# Patient Record
Sex: Female | Born: 1940 | Race: Black or African American | Hispanic: No | Marital: Single | Smoking: Current every day smoker
Health system: Southern US, Community
[De-identification: ages and names within clinical notes are randomized; demographics above are authoritative.]

## PROBLEM LIST (undated history)

## (undated) ENCOUNTER — Emergency Department (HOSPITAL_COMMUNITY): Admission: RE | Discharge status: Absent without leave | Payer: Medicare Other | Source: Ambulatory Visit

## (undated) DIAGNOSIS — M1990 Unspecified osteoarthritis, unspecified site: Secondary | ICD-10-CM

## (undated) DIAGNOSIS — R109 Unspecified abdominal pain: Secondary | ICD-10-CM

## (undated) DIAGNOSIS — F419 Anxiety disorder, unspecified: Secondary | ICD-10-CM

## (undated) DIAGNOSIS — I219 Acute myocardial infarction, unspecified: Secondary | ICD-10-CM

## (undated) DIAGNOSIS — D649 Anemia, unspecified: Secondary | ICD-10-CM

## (undated) DIAGNOSIS — F329 Major depressive disorder, single episode, unspecified: Secondary | ICD-10-CM

## (undated) DIAGNOSIS — F32A Depression, unspecified: Secondary | ICD-10-CM

## (undated) DIAGNOSIS — M7540 Impingement syndrome of unspecified shoulder: Secondary | ICD-10-CM

## (undated) DIAGNOSIS — K219 Gastro-esophageal reflux disease without esophagitis: Secondary | ICD-10-CM

## (undated) DIAGNOSIS — I350 Nonrheumatic aortic (valve) stenosis: Secondary | ICD-10-CM

## (undated) DIAGNOSIS — E7800 Pure hypercholesterolemia, unspecified: Secondary | ICD-10-CM

## (undated) DIAGNOSIS — G709 Myoneural disorder, unspecified: Secondary | ICD-10-CM

## (undated) DIAGNOSIS — I2510 Atherosclerotic heart disease of native coronary artery without angina pectoris: Secondary | ICD-10-CM

## (undated) DIAGNOSIS — E785 Hyperlipidemia, unspecified: Secondary | ICD-10-CM

## (undated) DIAGNOSIS — R0600 Dyspnea, unspecified: Secondary | ICD-10-CM

## (undated) DIAGNOSIS — I10 Essential (primary) hypertension: Secondary | ICD-10-CM

## (undated) DIAGNOSIS — Z8719 Personal history of other diseases of the digestive system: Secondary | ICD-10-CM

## (undated) DIAGNOSIS — I739 Peripheral vascular disease, unspecified: Secondary | ICD-10-CM

## (undated) DIAGNOSIS — Z9289 Personal history of other medical treatment: Secondary | ICD-10-CM

## (undated) DIAGNOSIS — D509 Iron deficiency anemia, unspecified: Secondary | ICD-10-CM

## (undated) HISTORY — DX: Anxiety disorder, unspecified: F41.9

## (undated) HISTORY — DX: Gastro-esophageal reflux disease without esophagitis: K21.9

## (undated) HISTORY — PX: CHOLECYSTECTOMY: SHX55

## (undated) HISTORY — DX: Iron deficiency anemia, unspecified: D50.9

## (undated) HISTORY — DX: Impingement syndrome of unspecified shoulder: M75.40

## (undated) HISTORY — DX: Nonrheumatic aortic (valve) stenosis: I35.0

## (undated) HISTORY — DX: Pure hypercholesterolemia, unspecified: E78.00

## (undated) HISTORY — DX: Essential (primary) hypertension: I10

## (undated) HISTORY — DX: Atherosclerotic heart disease of native coronary artery without angina pectoris: I25.10

## (undated) HISTORY — DX: Hyperlipidemia, unspecified: E78.5

## (undated) HISTORY — DX: Acute myocardial infarction, unspecified: I21.9

## (undated) HISTORY — PX: WRIST SURGERY: SHX841

## (undated) HISTORY — DX: Personal history of other diseases of the digestive system: Z87.19

## (undated) HISTORY — DX: Unspecified abdominal pain: R10.9

## (undated) HISTORY — DX: Peripheral vascular disease, unspecified: I73.9

## (undated) HISTORY — DX: Anemia, unspecified: D64.9

## (undated) HISTORY — PX: AORTIC VALVE REPLACEMENT: SHX41

## (undated) HISTORY — PX: ABDOMINAL HYSTERECTOMY: SHX81

## (undated) HISTORY — DX: Personal history of other medical treatment: Z92.89

## (undated) HISTORY — PX: CORONARY ARTERY BYPASS GRAFT: SHX141

---

## 1998-09-23 HISTORY — PX: HEEL SPUR EXCISION: SHX1733

## 1999-04-02 ENCOUNTER — Ambulatory Visit (HOSPITAL_COMMUNITY): Admission: RE | Admit: 1999-04-02 | Discharge: 1999-04-02 | Payer: Self-pay

## 1999-04-30 ENCOUNTER — Ambulatory Visit (HOSPITAL_COMMUNITY): Admission: RE | Admit: 1999-04-30 | Discharge: 1999-04-30 | Payer: Self-pay

## 1999-06-28 ENCOUNTER — Encounter: Payer: Self-pay | Admitting: Emergency Medicine

## 1999-06-28 ENCOUNTER — Emergency Department (HOSPITAL_COMMUNITY): Admission: EM | Admit: 1999-06-28 | Discharge: 1999-06-28 | Payer: Self-pay | Admitting: Emergency Medicine

## 1999-07-02 ENCOUNTER — Encounter: Admission: RE | Admit: 1999-07-02 | Discharge: 1999-07-02 | Payer: Self-pay | Admitting: Family Medicine

## 1999-07-12 ENCOUNTER — Encounter: Payer: Self-pay | Admitting: General Surgery

## 1999-07-16 ENCOUNTER — Ambulatory Visit (HOSPITAL_COMMUNITY): Admission: RE | Admit: 1999-07-16 | Discharge: 1999-07-17 | Payer: Self-pay | Admitting: General Surgery

## 1999-07-16 ENCOUNTER — Encounter: Payer: Self-pay | Admitting: General Surgery

## 1999-12-03 ENCOUNTER — Encounter: Payer: Self-pay | Admitting: Specialist

## 1999-12-05 ENCOUNTER — Ambulatory Visit (HOSPITAL_COMMUNITY): Admission: RE | Admit: 1999-12-05 | Discharge: 1999-12-06 | Payer: Self-pay | Admitting: Specialist

## 1999-12-28 ENCOUNTER — Inpatient Hospital Stay (HOSPITAL_COMMUNITY): Admission: EM | Admit: 1999-12-28 | Discharge: 1999-12-31 | Payer: Self-pay | Admitting: Emergency Medicine

## 1999-12-28 ENCOUNTER — Encounter: Payer: Self-pay | Admitting: Emergency Medicine

## 2000-03-10 ENCOUNTER — Encounter: Payer: Self-pay | Admitting: Specialist

## 2000-03-10 ENCOUNTER — Ambulatory Visit (HOSPITAL_COMMUNITY): Admission: RE | Admit: 2000-03-10 | Discharge: 2000-03-10 | Payer: Self-pay | Admitting: Specialist

## 2000-03-25 ENCOUNTER — Encounter: Payer: Self-pay | Admitting: Specialist

## 2000-03-25 ENCOUNTER — Ambulatory Visit (HOSPITAL_COMMUNITY): Admission: RE | Admit: 2000-03-25 | Discharge: 2000-03-25 | Payer: Self-pay | Admitting: Specialist

## 2000-04-08 ENCOUNTER — Ambulatory Visit (HOSPITAL_COMMUNITY): Admission: RE | Admit: 2000-04-08 | Discharge: 2000-04-08 | Payer: Self-pay | Admitting: Specialist

## 2000-05-01 ENCOUNTER — Encounter: Payer: Self-pay | Admitting: Specialist

## 2000-05-01 ENCOUNTER — Ambulatory Visit (HOSPITAL_COMMUNITY): Admission: RE | Admit: 2000-05-01 | Discharge: 2000-05-01 | Payer: Self-pay | Admitting: Specialist

## 2000-05-16 ENCOUNTER — Ambulatory Visit (HOSPITAL_COMMUNITY): Admission: RE | Admit: 2000-05-16 | Discharge: 2000-05-16 | Payer: Self-pay | Admitting: Specialist

## 2000-05-16 ENCOUNTER — Encounter: Payer: Self-pay | Admitting: Specialist

## 2000-08-13 ENCOUNTER — Encounter: Admission: RE | Admit: 2000-08-13 | Discharge: 2000-08-13 | Payer: Self-pay | Admitting: Family Medicine

## 2000-08-13 ENCOUNTER — Encounter: Payer: Self-pay | Admitting: Family Medicine

## 2000-09-18 ENCOUNTER — Encounter: Payer: Self-pay | Admitting: Specialist

## 2000-09-18 ENCOUNTER — Encounter (INDEPENDENT_AMBULATORY_CARE_PROVIDER_SITE_OTHER): Payer: Self-pay

## 2000-09-18 ENCOUNTER — Inpatient Hospital Stay (HOSPITAL_COMMUNITY): Admission: RE | Admit: 2000-09-18 | Discharge: 2000-09-20 | Payer: Self-pay | Admitting: Specialist

## 2000-10-27 ENCOUNTER — Encounter: Admission: RE | Admit: 2000-10-27 | Discharge: 2001-01-25 | Payer: Self-pay | Admitting: Specialist

## 2001-09-23 HISTORY — PX: SPINE SURGERY: SHX786

## 2002-02-09 ENCOUNTER — Encounter: Payer: Self-pay | Admitting: Specialist

## 2002-02-16 ENCOUNTER — Encounter: Payer: Self-pay | Admitting: Specialist

## 2002-02-16 ENCOUNTER — Encounter (INDEPENDENT_AMBULATORY_CARE_PROVIDER_SITE_OTHER): Payer: Self-pay

## 2002-02-16 ENCOUNTER — Inpatient Hospital Stay (HOSPITAL_COMMUNITY): Admission: RE | Admit: 2002-02-16 | Discharge: 2002-02-18 | Payer: Self-pay | Admitting: Specialist

## 2002-07-12 ENCOUNTER — Encounter: Payer: Self-pay | Admitting: Orthopedic Surgery

## 2002-07-12 ENCOUNTER — Encounter: Admission: RE | Admit: 2002-07-12 | Discharge: 2002-07-12 | Payer: Self-pay | Admitting: Orthopedic Surgery

## 2002-07-13 ENCOUNTER — Ambulatory Visit (HOSPITAL_BASED_OUTPATIENT_CLINIC_OR_DEPARTMENT_OTHER): Admission: RE | Admit: 2002-07-13 | Discharge: 2002-07-13 | Payer: Self-pay | Admitting: Orthopedic Surgery

## 2002-12-01 ENCOUNTER — Ambulatory Visit (HOSPITAL_COMMUNITY): Admission: RE | Admit: 2002-12-01 | Discharge: 2002-12-01 | Payer: Self-pay | Admitting: Family Medicine

## 2003-09-21 ENCOUNTER — Inpatient Hospital Stay (HOSPITAL_COMMUNITY): Admission: EM | Admit: 2003-09-21 | Discharge: 2003-09-21 | Payer: Self-pay | Admitting: Emergency Medicine

## 2003-10-18 ENCOUNTER — Ambulatory Visit (HOSPITAL_COMMUNITY): Admission: RE | Admit: 2003-10-18 | Discharge: 2003-10-18 | Payer: Self-pay

## 2003-10-24 ENCOUNTER — Ambulatory Visit (HOSPITAL_COMMUNITY): Admission: RE | Admit: 2003-10-24 | Discharge: 2003-10-24 | Payer: Self-pay

## 2003-10-26 ENCOUNTER — Encounter: Admission: RE | Admit: 2003-10-26 | Discharge: 2003-11-06 | Payer: Self-pay

## 2003-10-31 ENCOUNTER — Encounter (INDEPENDENT_AMBULATORY_CARE_PROVIDER_SITE_OTHER): Payer: Self-pay | Admitting: Specialist

## 2003-10-31 ENCOUNTER — Inpatient Hospital Stay (HOSPITAL_COMMUNITY): Admission: RE | Admit: 2003-10-31 | Discharge: 2003-11-06 | Payer: Self-pay

## 2003-11-06 ENCOUNTER — Ambulatory Visit (HOSPITAL_COMMUNITY): Admission: RE | Admit: 2003-11-06 | Discharge: 2003-11-06 | Payer: Self-pay | Admitting: Surgery

## 2004-03-27 ENCOUNTER — Ambulatory Visit (HOSPITAL_COMMUNITY): Admission: RE | Admit: 2004-03-27 | Discharge: 2004-03-27 | Payer: Self-pay | Admitting: Family Medicine

## 2004-09-03 ENCOUNTER — Emergency Department (HOSPITAL_COMMUNITY): Admission: EM | Admit: 2004-09-03 | Discharge: 2004-09-03 | Payer: Self-pay | Admitting: Emergency Medicine

## 2005-05-16 ENCOUNTER — Ambulatory Visit (HOSPITAL_COMMUNITY): Admission: RE | Admit: 2005-05-16 | Discharge: 2005-05-16 | Payer: Self-pay | Admitting: Family Medicine

## 2006-06-24 ENCOUNTER — Ambulatory Visit (HOSPITAL_COMMUNITY): Admission: RE | Admit: 2006-06-24 | Discharge: 2006-06-24 | Payer: Self-pay | Admitting: Family Medicine

## 2007-02-18 ENCOUNTER — Inpatient Hospital Stay (HOSPITAL_COMMUNITY): Admission: EM | Admit: 2007-02-18 | Discharge: 2007-02-21 | Payer: Self-pay | Admitting: Emergency Medicine

## 2007-05-24 ENCOUNTER — Emergency Department (HOSPITAL_COMMUNITY): Admission: EM | Admit: 2007-05-24 | Discharge: 2007-05-24 | Payer: Self-pay | Admitting: Emergency Medicine

## 2007-07-01 ENCOUNTER — Ambulatory Visit (HOSPITAL_COMMUNITY): Admission: RE | Admit: 2007-07-01 | Discharge: 2007-07-01 | Payer: Self-pay | Admitting: Family Medicine

## 2007-08-04 ENCOUNTER — Ambulatory Visit (HOSPITAL_COMMUNITY): Admission: RE | Admit: 2007-08-04 | Discharge: 2007-08-04 | Payer: Self-pay | Admitting: Gastroenterology

## 2008-04-08 ENCOUNTER — Inpatient Hospital Stay (HOSPITAL_COMMUNITY): Admission: AD | Admit: 2008-04-08 | Discharge: 2008-04-11 | Payer: Self-pay | Admitting: Family Medicine

## 2008-04-11 ENCOUNTER — Encounter (INDEPENDENT_AMBULATORY_CARE_PROVIDER_SITE_OTHER): Payer: Self-pay | Admitting: Gastroenterology

## 2008-04-21 ENCOUNTER — Encounter: Admission: RE | Admit: 2008-04-21 | Discharge: 2008-04-21 | Payer: Self-pay | Admitting: Gastroenterology

## 2008-07-01 ENCOUNTER — Ambulatory Visit (HOSPITAL_COMMUNITY): Admission: RE | Admit: 2008-07-01 | Discharge: 2008-07-01 | Payer: Self-pay | Admitting: Family Medicine

## 2008-09-23 DIAGNOSIS — I219 Acute myocardial infarction, unspecified: Secondary | ICD-10-CM

## 2008-09-23 HISTORY — DX: Acute myocardial infarction, unspecified: I21.9

## 2008-09-23 HISTORY — PX: CARDIAC SURGERY: SHX584

## 2008-09-26 ENCOUNTER — Encounter: Admission: RE | Admit: 2008-09-26 | Discharge: 2008-09-26 | Payer: Self-pay | Admitting: Family Medicine

## 2008-12-05 ENCOUNTER — Ambulatory Visit: Payer: Self-pay | Admitting: *Deleted

## 2008-12-05 ENCOUNTER — Inpatient Hospital Stay (HOSPITAL_COMMUNITY): Admission: EM | Admit: 2008-12-05 | Discharge: 2008-12-13 | Payer: Self-pay | Admitting: Emergency Medicine

## 2008-12-08 ENCOUNTER — Encounter (INDEPENDENT_AMBULATORY_CARE_PROVIDER_SITE_OTHER): Payer: Self-pay | Admitting: Internal Medicine

## 2008-12-09 ENCOUNTER — Ambulatory Visit: Payer: Self-pay | Admitting: Surgery

## 2008-12-29 ENCOUNTER — Inpatient Hospital Stay (HOSPITAL_COMMUNITY): Admission: RE | Admit: 2008-12-29 | Discharge: 2009-01-03 | Payer: Self-pay | Admitting: Surgery

## 2008-12-29 ENCOUNTER — Encounter: Payer: Self-pay | Admitting: Surgery

## 2009-01-24 ENCOUNTER — Ambulatory Visit: Payer: Self-pay | Admitting: Surgery

## 2009-01-24 ENCOUNTER — Encounter: Admission: RE | Admit: 2009-01-24 | Discharge: 2009-01-24 | Payer: Self-pay | Admitting: Surgery

## 2009-04-13 ENCOUNTER — Encounter (HOSPITAL_COMMUNITY): Admission: RE | Admit: 2009-04-13 | Discharge: 2009-07-12 | Payer: Self-pay | Admitting: Cardiology

## 2009-07-13 ENCOUNTER — Encounter (HOSPITAL_COMMUNITY): Admission: RE | Admit: 2009-07-13 | Discharge: 2009-09-20 | Payer: Self-pay | Admitting: Cardiology

## 2011-01-02 LAB — POCT I-STAT 4, (NA,K, GLUC, HGB,HCT)
Glucose, Bld: 115 mg/dL — ABNORMAL HIGH (ref 70–99)
Glucose, Bld: 116 mg/dL — ABNORMAL HIGH (ref 70–99)
Glucose, Bld: 128 mg/dL — ABNORMAL HIGH (ref 70–99)
Glucose, Bld: 133 mg/dL — ABNORMAL HIGH (ref 70–99)
HCT: 25 % — ABNORMAL LOW (ref 36.0–46.0)
HCT: 25 % — ABNORMAL LOW (ref 36.0–46.0)
HCT: 27 % — ABNORMAL LOW (ref 36.0–46.0)
HCT: 31 % — ABNORMAL LOW (ref 36.0–46.0)
Hemoglobin: 10.5 g/dL — ABNORMAL LOW (ref 12.0–15.0)
Hemoglobin: 8.5 g/dL — ABNORMAL LOW (ref 12.0–15.0)
Hemoglobin: 8.8 g/dL — ABNORMAL LOW (ref 12.0–15.0)
Hemoglobin: 9.2 g/dL — ABNORMAL LOW (ref 12.0–15.0)
Potassium: 3 mEq/L — ABNORMAL LOW (ref 3.5–5.1)
Potassium: 3 mEq/L — ABNORMAL LOW (ref 3.5–5.1)
Potassium: 3.5 mEq/L (ref 3.5–5.1)
Potassium: 4.4 mEq/L (ref 3.5–5.1)
Sodium: 134 mEq/L — ABNORMAL LOW (ref 135–145)
Sodium: 141 mEq/L (ref 135–145)

## 2011-01-02 LAB — POCT I-STAT, CHEM 8
BUN: 7 mg/dL (ref 6–23)
Chloride: 107 mEq/L (ref 96–112)
Creatinine, Ser: 0.7 mg/dL (ref 0.4–1.2)
Glucose, Bld: 129 mg/dL — ABNORMAL HIGH (ref 70–99)
Potassium: 4.2 mEq/L (ref 3.5–5.1)
Sodium: 145 mEq/L (ref 135–145)

## 2011-01-02 LAB — TYPE AND SCREEN
ABO/RH(D): O POS
Donor AG Type: NEGATIVE
Donor AG Type: NEGATIVE
Donor AG Type: NEGATIVE

## 2011-01-02 LAB — BASIC METABOLIC PANEL
CO2: 28 mEq/L (ref 19–32)
CO2: 28 mEq/L (ref 19–32)
Calcium: 7.7 mg/dL — ABNORMAL LOW (ref 8.4–10.5)
Calcium: 8.6 mg/dL (ref 8.4–10.5)
Chloride: 104 mEq/L (ref 96–112)
Chloride: 106 mEq/L (ref 96–112)
Creatinine, Ser: 0.6 mg/dL (ref 0.4–1.2)
GFR calc Af Amer: >60 mL/min (ref >60)
GFR calc Af Amer: >60 mL/min (ref >60)
GFR calc non Af Amer: >60 mL/min (ref >60)
Glucose, Bld: 88 mg/dL (ref 70–99)
Potassium: 3.8 mEq/L (ref 3.5–5.1)
Potassium: 3.9 mEq/L (ref 3.5–5.1)
Sodium: 138 mEq/L (ref 135–145)
Sodium: 139 mEq/L (ref 135–145)
Sodium: 141 mEq/L (ref 135–145)

## 2011-01-02 LAB — CBC
HCT: 24.8 % — ABNORMAL LOW (ref 36.0–46.0)
HCT: 25 % — ABNORMAL LOW (ref 36.0–46.0)
HCT: 25.6 % — ABNORMAL LOW (ref 36.0–46.0)
HCT: 26.6 % — ABNORMAL LOW (ref 36.0–46.0)
HCT: 27 % — ABNORMAL LOW (ref 36.0–46.0)
Hemoglobin: 8.2 g/dL — ABNORMAL LOW (ref 12.0–15.0)
Hemoglobin: 8.4 g/dL — ABNORMAL LOW (ref 12.0–15.0)
Hemoglobin: 8.4 g/dL — ABNORMAL LOW (ref 12.0–15.0)
Hemoglobin: 8.5 g/dL — ABNORMAL LOW (ref 12.0–15.0)
Hemoglobin: 9.1 g/dL — ABNORMAL LOW (ref 12.0–15.0)
MCHC: 32.8 g/dL (ref 30.0–36.0)
MCHC: 33.2 g/dL (ref 30.0–36.0)
MCHC: 33.3 g/dL (ref 30.0–36.0)
MCHC: 33.8 g/dL (ref 30.0–36.0)
MCHC: 34.1 g/dL (ref 30.0–36.0)
MCV: 76.3 fL — ABNORMAL LOW (ref 78.0–100.0)
MCV: 79.6 fL (ref 78.0–100.0)
MCV: 82 fL (ref 78.0–100.0)
MCV: 83.6 fL (ref 78.0–100.0)
Platelets: 129 10*3/uL — ABNORMAL LOW (ref 150–400)
Platelets: 160 10*3/uL (ref 150–400)
Platelets: 219 10*3/uL (ref 150–400)
RBC: 3.02 MIL/uL — ABNORMAL LOW (ref 3.87–5.11)
RBC: 3.12 MIL/uL — ABNORMAL LOW (ref 3.87–5.11)
RBC: 3.12 MIL/uL — ABNORMAL LOW (ref 3.87–5.11)
RBC: 3.34 MIL/uL — ABNORMAL LOW (ref 3.87–5.11)
RDW: 21.6 % — ABNORMAL HIGH (ref 11.5–15.5)
RDW: 21.9 % — ABNORMAL HIGH (ref 11.5–15.5)
RDW: 22.9 % — ABNORMAL HIGH (ref 11.5–15.5)
RDW: 23.5 % — ABNORMAL HIGH (ref 11.5–15.5)
WBC: 11.3 10*3/uL — ABNORMAL HIGH (ref 4.0–10.5)
WBC: 11.4 10*3/uL — ABNORMAL HIGH (ref 4.0–10.5)
WBC: 13.1 10*3/uL — ABNORMAL HIGH (ref 4.0–10.5)
WBC: 8.3 10*3/uL (ref 4.0–10.5)

## 2011-01-02 LAB — COMPREHENSIVE METABOLIC PANEL
AST: 31 U/L (ref 0–37)
Albumin: 3.5 g/dL (ref 3.5–5.2)
Chloride: 107 mEq/L (ref 96–112)
Creatinine, Ser: 0.63 mg/dL (ref 0.4–1.2)
GFR calc Af Amer: >60 mL/min (ref >60)
Total Bilirubin: 0.5 mg/dL (ref 0.3–1.2)
Total Protein: 6.9 g/dL (ref 6.0–8.3)

## 2011-01-02 LAB — POCT I-STAT 3, ART BLOOD GAS (G3+)
Acid-base deficit: 1 mmol/L (ref 0.0–2.0)
Acid-base deficit: 1 mmol/L (ref 0.0–2.0)
Acid-base deficit: 4 mmol/L — ABNORMAL HIGH (ref 0.0–2.0)
Bicarbonate: 21.3 mEq/L (ref 20.0–24.0)
Bicarbonate: 22.2 mEq/L (ref 20.0–24.0)
Bicarbonate: 23.3 mEq/L (ref 20.0–24.0)
O2 Saturation: 100 %
O2 Saturation: 95 %
O2 Saturation: 98 %
TCO2: 22 mmol/L (ref 0–100)
TCO2: 23 mmol/L (ref 0–100)
TCO2: 24 mmol/L (ref 0–100)
pCO2 arterial: 30.2 mmHg — ABNORMAL LOW (ref 35.0–45.0)
pCO2 arterial: 30.9 mmHg — ABNORMAL LOW (ref 35.0–45.0)
pCO2 arterial: 36 mmHg (ref 35.0–45.0)
pH, Arterial: 7.399 (ref 7.350–7.400)
pO2, Arterial: 105 mmHg — ABNORMAL HIGH (ref 80.0–100.0)
pO2, Arterial: 130 mmHg — ABNORMAL HIGH (ref 80.0–100.0)
pO2, Arterial: 351 mmHg — ABNORMAL HIGH (ref 80.0–100.0)
pO2, Arterial: 363 mmHg — ABNORMAL HIGH (ref 80.0–100.0)
pO2, Arterial: 83 mmHg (ref 80.0–100.0)

## 2011-01-02 LAB — GLUCOSE, CAPILLARY
Glucose-Capillary: 109 mg/dL — ABNORMAL HIGH (ref 70–99)
Glucose-Capillary: 121 mg/dL — ABNORMAL HIGH (ref 70–99)
Glucose-Capillary: 143 mg/dL — ABNORMAL HIGH (ref 70–99)

## 2011-01-02 LAB — URINE CULTURE

## 2011-01-02 LAB — URINALYSIS, ROUTINE W REFLEX MICROSCOPIC
Nitrite: NEGATIVE
Protein, ur: NEGATIVE mg/dL
Urobilinogen, UA: 0.2 mg/dL (ref 0.0–1.0)

## 2011-01-02 LAB — BLOOD GAS, ARTERIAL
Bicarbonate: 22.3 mEq/L (ref 20.0–24.0)
Patient temperature: 98.6
TCO2: 23.3 mmol/L (ref 0–100)
pCO2 arterial: 30.8 mmHg — ABNORMAL LOW (ref 35.0–45.0)
pO2, Arterial: 113 mmHg — ABNORMAL HIGH (ref 80.0–100.0)

## 2011-01-02 LAB — HEMOGLOBIN AND HEMATOCRIT, BLOOD: Hemoglobin: 8.9 g/dL — ABNORMAL LOW (ref 12.0–15.0)

## 2011-01-02 LAB — PROTIME-INR
INR: 1 (ref 0.00–1.49)
INR: 1.9 — ABNORMAL HIGH (ref 0.00–1.49)
Prothrombin Time: 13.8 seconds (ref 11.6–15.2)
Prothrombin Time: 15.4 seconds — ABNORMAL HIGH (ref 11.6–15.2)

## 2011-01-02 LAB — APTT
aPTT: 26 seconds (ref 24–37)
aPTT: 45 seconds — ABNORMAL HIGH (ref 24–37)

## 2011-01-02 LAB — PREPARE PLATELETS

## 2011-01-02 LAB — CROSSMATCH: ABO/RH(D): O POS

## 2011-01-03 LAB — POCT CARDIAC MARKERS
CKMB, poc: <1 ng/mL — ABNORMAL LOW (ref 1.0–8.0)
Myoglobin, poc: 52.1 ng/mL (ref 12–200)

## 2011-01-03 LAB — CBC
HCT: 29.7 % — ABNORMAL LOW (ref 36.0–46.0)
HCT: 29.7 % — ABNORMAL LOW (ref 36.0–46.0)
HCT: 30 % — ABNORMAL LOW (ref 36.0–46.0)
HCT: 30.8 % — ABNORMAL LOW (ref 36.0–46.0)
HCT: 31 % — ABNORMAL LOW (ref 36.0–46.0)
HCT: 32.8 % — ABNORMAL LOW (ref 36.0–46.0)
HCT: 33.3 % — ABNORMAL LOW (ref 36.0–46.0)
HCT: 33.6 % — ABNORMAL LOW (ref 36.0–46.0)
HCT: 35.1 % — ABNORMAL LOW (ref 36.0–46.0)
HCT: 12.8 % — ABNORMAL LOW (ref 36.0–46.0)
HCT: 23.6 % — ABNORMAL LOW (ref 36.0–46.0)
HCT: 31.8 % — ABNORMAL LOW (ref 36.0–46.0)
Hemoglobin: 10 g/dL — ABNORMAL LOW (ref 12.0–15.0)
Hemoglobin: 10.2 g/dL — ABNORMAL LOW (ref 12.0–15.0)
Hemoglobin: 10.5 g/dL — ABNORMAL LOW (ref 12.0–15.0)
Hemoglobin: 10.8 g/dL — ABNORMAL LOW (ref 12.0–15.0)
Hemoglobin: 10.9 g/dL — ABNORMAL LOW (ref 12.0–15.0)
Hemoglobin: 11.3 g/dL — ABNORMAL LOW (ref 12.0–15.0)
Hemoglobin: 9.9 g/dL — ABNORMAL LOW (ref 12.0–15.0)
Hemoglobin: 9.9 g/dL — ABNORMAL LOW (ref 12.0–15.0)
Hemoglobin: 10.5 g/dL — ABNORMAL LOW (ref 12.0–15.0)
Hemoglobin: 4 g/dL — CL (ref 12.0–15.0)
MCHC: 32.2 g/dL (ref 30.0–36.0)
MCHC: 32.3 g/dL (ref 30.0–36.0)
MCHC: 32.5 g/dL (ref 30.0–36.0)
MCHC: 32.8 g/dL (ref 30.0–36.0)
MCHC: 32.9 g/dL (ref 30.0–36.0)
MCHC: 33.3 g/dL (ref 30.0–36.0)
MCHC: 33.8 g/dL (ref 30.0–36.0)
MCHC: 31 g/dL (ref 30.0–36.0)
MCHC: 32.3 g/dL (ref 30.0–36.0)
MCHC: 32.7 g/dL (ref 30.0–36.0)
MCV: 76.9 fL — ABNORMAL LOW (ref 78.0–100.0)
MCV: 76.9 fL — ABNORMAL LOW (ref 78.0–100.0)
MCV: 76.9 fL — ABNORMAL LOW (ref 78.0–100.0)
MCV: 77.1 fL — ABNORMAL LOW (ref 78.0–100.0)
MCV: 77.1 fL — ABNORMAL LOW (ref 78.0–100.0)
MCV: 77.6 fL — ABNORMAL LOW (ref 78.0–100.0)
MCV: 77.9 fL — ABNORMAL LOW (ref 78.0–100.0)
MCV: 78 fL (ref 78.0–100.0)
MCV: 66.3 fL — ABNORMAL LOW (ref 78.0–100.0)
MCV: 77.8 fL — ABNORMAL LOW (ref 78.0–100.0)
MCV: 77.9 fL — ABNORMAL LOW (ref 78.0–100.0)
Platelets: 103 10*3/uL — ABNORMAL LOW (ref 150–400)
Platelets: 143 10*3/uL — ABNORMAL LOW (ref 150–400)
Platelets: 144 10*3/uL — ABNORMAL LOW (ref 150–400)
Platelets: 158 10*3/uL (ref 150–400)
Platelets: 161 10*3/uL (ref 150–400)
Platelets: 165 10*3/uL (ref 150–400)
Platelets: 184 10*3/uL (ref 150–400)
Platelets: 213 10*3/uL (ref 150–400)
Platelets: 127 10*3/uL — ABNORMAL LOW (ref 150–400)
Platelets: 160 10*3/uL (ref 150–400)
RBC: 3.85 MIL/uL — ABNORMAL LOW (ref 3.87–5.11)
RBC: 3.85 MIL/uL — ABNORMAL LOW (ref 3.87–5.11)
RBC: 3.87 MIL/uL (ref 3.87–5.11)
RBC: 4.04 MIL/uL (ref 3.87–5.11)
RBC: 4.36 MIL/uL (ref 3.87–5.11)
RBC: 4.51 MIL/uL (ref 3.87–5.11)
RBC: 1.92 MIL/uL — ABNORMAL LOW (ref 3.87–5.11)
RBC: 4.12 MIL/uL (ref 3.87–5.11)
RDW: 19.8 % — ABNORMAL HIGH (ref 11.5–15.5)
RDW: 20.1 % — ABNORMAL HIGH (ref 11.5–15.5)
RDW: 20.6 % — ABNORMAL HIGH (ref 11.5–15.5)
RDW: 20.8 % — ABNORMAL HIGH (ref 11.5–15.5)
RDW: 21 % — ABNORMAL HIGH (ref 11.5–15.5)
RDW: 21.1 % — ABNORMAL HIGH (ref 11.5–15.5)
RDW: 21.4 % — ABNORMAL HIGH (ref 11.5–15.5)
RDW: 21.6 % — ABNORMAL HIGH (ref 11.5–15.5)
RDW: 22 % — ABNORMAL HIGH (ref 11.5–15.5)
RDW: 22.3 % — ABNORMAL HIGH (ref 11.5–15.5)
RDW: 20 % — ABNORMAL HIGH (ref 11.5–15.5)
RDW: 22.4 % — ABNORMAL HIGH (ref 11.5–15.5)
RDW: 22.4 % — ABNORMAL HIGH (ref 11.5–15.5)
WBC: 11.6 10*3/uL — ABNORMAL HIGH (ref 4.0–10.5)
WBC: 12.4 10*3/uL — ABNORMAL HIGH (ref 4.0–10.5)
WBC: 13.2 10*3/uL — ABNORMAL HIGH (ref 4.0–10.5)
WBC: 14 10*3/uL — ABNORMAL HIGH (ref 4.0–10.5)
WBC: 14.2 10*3/uL — ABNORMAL HIGH (ref 4.0–10.5)
WBC: 14.5 10*3/uL — ABNORMAL HIGH (ref 4.0–10.5)
WBC: 15.3 10*3/uL — ABNORMAL HIGH (ref 4.0–10.5)

## 2011-01-03 LAB — BASIC METABOLIC PANEL
BUN: 13 mg/dL (ref 6–23)
BUN: 13 mg/dL (ref 6–23)
BUN: 14 mg/dL (ref 6–23)
BUN: 14 mg/dL (ref 6–23)
BUN: 15 mg/dL (ref 6–23)
BUN: 11 mg/dL (ref 6–23)
BUN: 7 mg/dL (ref 6–23)
CO2: 22 mEq/L (ref 19–32)
CO2: 22 mEq/L (ref 19–32)
CO2: 23 mEq/L (ref 19–32)
CO2: 27 mEq/L (ref 19–32)
CO2: 27 mEq/L (ref 19–32)
Calcium: 8.2 mg/dL — ABNORMAL LOW (ref 8.4–10.5)
Calcium: 8.3 mg/dL — ABNORMAL LOW (ref 8.4–10.5)
Calcium: 8.8 mg/dL (ref 8.4–10.5)
Calcium: 8.8 mg/dL (ref 8.4–10.5)
Calcium: 9 mg/dL (ref 8.4–10.5)
Calcium: 9.1 mg/dL (ref 8.4–10.5)
Calcium: 9.5 mg/dL (ref 8.4–10.5)
Calcium: 7.5 mg/dL — ABNORMAL LOW (ref 8.4–10.5)
Calcium: 8 mg/dL — ABNORMAL LOW (ref 8.4–10.5)
Chloride: 106 mEq/L (ref 96–112)
Chloride: 108 mEq/L (ref 96–112)
Chloride: 109 mEq/L (ref 96–112)
Chloride: 111 mEq/L (ref 96–112)
Chloride: 114 mEq/L — ABNORMAL HIGH (ref 96–112)
Creatinine, Ser: 0.61 mg/dL (ref 0.4–1.2)
Creatinine, Ser: 0.65 mg/dL (ref 0.4–1.2)
Creatinine, Ser: 0.67 mg/dL (ref 0.4–1.2)
Creatinine, Ser: 0.8 mg/dL (ref 0.4–1.2)
Creatinine, Ser: 0.87 mg/dL (ref 0.4–1.2)
Creatinine, Ser: 0.63 mg/dL (ref 0.4–1.2)
Creatinine, Ser: 0.7 mg/dL (ref 0.4–1.2)
GFR calc Af Amer: >60 mL/min (ref >60)
GFR calc Af Amer: >60 mL/min (ref >60)
GFR calc Af Amer: >60 mL/min (ref >60)
GFR calc Af Amer: >60 mL/min (ref >60)
GFR calc Af Amer: >60 mL/min (ref >60)
GFR calc Af Amer: >60 mL/min (ref >60)
GFR calc non Af Amer: >60 mL/min (ref >60)
GFR calc non Af Amer: >60 mL/min (ref >60)
GFR calc non Af Amer: >60 mL/min (ref >60)
GFR calc non Af Amer: >60 mL/min (ref >60)
GFR calc non Af Amer: >60 mL/min (ref >60)
GFR calc non Af Amer: >60 mL/min (ref >60)
GFR calc non Af Amer: >60 mL/min (ref >60)
GFR calc non Af Amer: >60 mL/min (ref >60)
GFR calc non Af Amer: >60 mL/min (ref >60)
Glucose, Bld: 113 mg/dL — ABNORMAL HIGH (ref 70–99)
Glucose, Bld: 139 mg/dL — ABNORMAL HIGH (ref 70–99)
Glucose, Bld: 76 mg/dL (ref 70–99)
Glucose, Bld: 78 mg/dL (ref 70–99)
Glucose, Bld: 79 mg/dL (ref 70–99)
Glucose, Bld: 82 mg/dL (ref 70–99)
Glucose, Bld: 98 mg/dL (ref 70–99)
Glucose, Bld: 137 mg/dL — ABNORMAL HIGH (ref 70–99)
Potassium: 3.6 mEq/L (ref 3.5–5.1)
Potassium: 3.7 mEq/L (ref 3.5–5.1)
Potassium: 3.7 mEq/L (ref 3.5–5.1)
Potassium: 3.7 mEq/L (ref 3.5–5.1)
Potassium: 3.8 mEq/L (ref 3.5–5.1)
Potassium: 4.2 mEq/L (ref 3.5–5.1)
Potassium: 3.2 mEq/L — ABNORMAL LOW (ref 3.5–5.1)
Sodium: 141 mEq/L (ref 135–145)
Sodium: 141 mEq/L (ref 135–145)
Sodium: 141 mEq/L (ref 135–145)
Sodium: 142 mEq/L (ref 135–145)
Sodium: 143 mEq/L (ref 135–145)
Sodium: 143 mEq/L (ref 135–145)
Sodium: 143 mEq/L (ref 135–145)

## 2011-01-03 LAB — MAGNESIUM: Magnesium: 1.9 mg/dL (ref 1.5–2.5)

## 2011-01-03 LAB — TYPE AND SCREEN
ABO/RH(D): O POS
Antibody Screen: POSITIVE
DAT, IgG: NEGATIVE
Donor AG Type: NEGATIVE
Donor AG Type: NEGATIVE
Donor AG Type: NEGATIVE

## 2011-01-03 LAB — DIFFERENTIAL
Basophils Absolute: 0 10*3/uL (ref 0.0–0.1)
Eosinophils Absolute: 0 10*3/uL (ref 0.0–0.7)
Eosinophils Absolute: 0 10*3/uL (ref 0.0–0.7)
Lymphocytes Relative: 10 % — ABNORMAL LOW (ref 12–46)
Lymphocytes Relative: 4 % — ABNORMAL LOW (ref 12–46)
Lymphs Abs: 0.8 10*3/uL (ref 0.7–4.0)
Lymphs Abs: 1.2 10*3/uL (ref 0.7–4.0)
Monocytes Absolute: 0.7 10*3/uL (ref 0.1–1.0)
Monocytes Relative: 6 % (ref 3–12)
Neutro Abs: 19.1 10*3/uL — ABNORMAL HIGH (ref 1.7–7.7)

## 2011-01-03 LAB — BLOOD GAS, ARTERIAL
Acid-base deficit: 5.3 mmol/L — ABNORMAL HIGH (ref 0.0–2.0)
Drawn by: 234041
O2 Saturation: 88.4 %
Patient temperature: 98.6
TCO2: 19 mmol/L (ref 0–100)

## 2011-01-03 LAB — COMPREHENSIVE METABOLIC PANEL
ALT: 10 U/L (ref 0–35)
AST: 15 U/L (ref 0–37)
Albumin: 3.1 g/dL — ABNORMAL LOW (ref 3.5–5.2)
Alkaline Phosphatase: 65 U/L (ref 39–117)
BUN: 12 mg/dL (ref 6–23)
CO2: 21 mEq/L (ref 19–32)
Calcium: 8.4 mg/dL (ref 8.4–10.5)
Chloride: 112 mEq/L (ref 96–112)
Creatinine, Ser: 0.84 mg/dL (ref 0.4–1.2)
GFR calc Af Amer: >60 mL/min (ref >60)
GFR calc non Af Amer: >60 mL/min (ref >60)
Glucose, Bld: 122 mg/dL — ABNORMAL HIGH (ref 70–99)
Potassium: 3.3 mEq/L — ABNORMAL LOW (ref 3.5–5.1)
Sodium: 142 mEq/L (ref 135–145)
Total Bilirubin: 0.3 mg/dL (ref 0.3–1.2)
Total Protein: 6.3 g/dL (ref 6.0–8.3)

## 2011-01-03 LAB — URINE CULTURE

## 2011-01-03 LAB — CULTURE, BLOOD (ROUTINE X 2)
Culture: NO GROWTH
Culture: NO GROWTH

## 2011-01-03 LAB — POCT I-STAT 3, ART BLOOD GAS (G3+)
Bicarbonate: 24.4 mEq/L — ABNORMAL HIGH (ref 20.0–24.0)
TCO2: 26 mmol/L (ref 0–100)

## 2011-01-03 LAB — POCT I-STAT, CHEM 8
Calcium, Ion: 0.84 mmol/L — ABNORMAL LOW (ref 1.12–1.32)
Glucose, Bld: 113 mg/dL — ABNORMAL HIGH (ref 70–99)
HCT: 23 % — ABNORMAL LOW (ref 36.0–46.0)
Hemoglobin: 7.8 g/dL — CL (ref 12.0–15.0)
Potassium: 3.4 mEq/L — ABNORMAL LOW (ref 3.5–5.1)

## 2011-01-03 LAB — POTASSIUM: Potassium: 3.4 mEq/L — ABNORMAL LOW (ref 3.5–5.1)

## 2011-01-03 LAB — PROTIME-INR
INR: 1.1 (ref 0.00–1.49)
Prothrombin Time: 14.8 seconds (ref 11.6–15.2)

## 2011-01-03 LAB — URINE MICROSCOPIC-ADD ON

## 2011-01-03 LAB — URINALYSIS, ROUTINE W REFLEX MICROSCOPIC
Ketones, ur: NEGATIVE mg/dL
Nitrite: NEGATIVE
pH: 6 (ref 5.0–8.0)

## 2011-01-03 LAB — LACTIC ACID, PLASMA: Lactic Acid, Venous: 1.7 mmol/L (ref 0.5–2.2)

## 2011-01-03 LAB — POCT I-STAT 3, VENOUS BLOOD GAS (G3P V)
Bicarbonate: 21.3 mEq/L (ref 20.0–24.0)
pH, Ven: 7.344 — ABNORMAL HIGH (ref 7.250–7.300)

## 2011-01-03 LAB — APTT: aPTT: 27 seconds (ref 24–37)

## 2011-01-03 LAB — CARDIAC PANEL(CRET KIN+CKTOT+MB+TROPI): Total CK: 84 U/L (ref 7–177)

## 2011-01-03 LAB — SAMPLE TO BLOOD BANK

## 2011-01-03 LAB — HEMOCCULT GUIAC POC 1CARD (OFFICE): Fecal Occult Bld: POSITIVE

## 2011-02-05 NOTE — Op Note (Signed)
[name], [name]              ACCOUNT NO.:  [id number]   MEDICAL RECORD NO.:  [id number]          PATIENT TYPE:  INP   LOCATION:  2908                         FACILITY:  [facility]   PHYSICIAN:  John C. Madilyn Fireman, M.D.    DATE OF BIRTH:  November 01, 1940   DATE OF PROCEDURE:  DATE OF DISCHARGE:                               OPERATIVE REPORT   PROCEDURE:  Small bowel capsule endoscopy.   INDICATIONS FOR PROCEDURE:  GI bleeding of obscure origin.   FINDINGS:  Several nonbleeding AVMs, one in the distal duodenum or  proximal jejunum, four or five more seen in the mid to distal small  bowel, none within the last 20 cm of the ilium and none were bleeding.  No other lesions seen.   IMPRESSION:  Arteriovenous malformations, nonbleeding at present.   PLAN:  Consider course of estrogen no other site for bleeding  elucidated.           ______________________________  Everardo All. Madilyn Fireman, M.D.     JCH/MEDQ  D:  12/07/2008  T:  12/07/2008  Job:  [id number]

## 2011-02-05 NOTE — H&P (Signed)
[name], [name] NO.:  [id number]   MEDICAL RECORD NO.:  [id number]          PATIENT TYPE:  INP   LOCATION:  2908                         FACILITY:  [facility]   PHYSICIAN:  Adrienne Sender, MD         DATE OF BIRTH:  [birth year]   DATE OF ADMISSION:  12/05/2008  DATE OF DISCHARGE:                              HISTORY & PHYSICAL   PRIMARY CARE PHYSICIAN:  Deatra James, M.D. with [facility].   ADMITTING SERVICE:  [place].   CHIEF COMPLAINT:  Shortness of breath.   HISTORY OF PRESENT ILLNESS:  [name] is a 70 year old African  American female with past medical history significant for hypertension,  gastric AVMs with GI bleeding and hyperlipidemia, who presents to the  emergency department with chief complaint of shortness of breath.  The  patient reports she was seen by her primary care [name] approximately  10 days ago with shortness of breath and swollen glands.  At that time,  the patient was diagnosed with bronchitis and started on amoxicillin for  bronchitis.  She reported subjective fevers and chest pain during that  time.  Her symptoms failed to improve and she continued to have  shortness of breath and dyspnea on exertion and reported that she  noticed that her hands were very pale.  She came to the emergency  department for evaluation, and upon arrival was noted to be hypotensive  with blood pressures in the 80s.  She promptly dropped her systolic  blood pressures to 60, and blood work came back with a hemoglobin of 4.  The patient was transfused emergent blood and was fluid resuscitated.  She has gotten a second unit of blood and has been volume expanded with  approximately 6 liters of blood and fluids at this point with  improvement in her blood pressure.  She was evaluated by critical care,  but blood pressure had improved at that point, so it was felt that she  could be admitted by the hospitalist group.  The patient denies  diarrhea, constipation.  She is unable to tell if there is blood in her  stool or not. She denies nausea, vomiting or vision changes.  She  reports symptoms consistent with presyncope at home.  When asked why she  did not come to the hospital sooner, she reported that she was supposed  to see her primary care [name] tomorrow and felt that she would be  able to take care of her anemia.  The patient denies any use of BC or  Goody Powders, but does report chronic NSAID use.   PAST MEDICAL HISTORY:  1. Hypertension.  2. 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  3. Hyperlipidemia.  4. Anxiety.  5. Chronic headaches.  6. Chronic back pain.   CURRENT MEDICATIONS:  1. Potassium chloride 10 mEq one tablet daily.  2. Tramadol 50 mg p.r.n.  3. Naproxen 550 mg b.i.d.  4. Amoxicillin 875 mg b.i.d.  5. Verapamil 240 mg one time daily.  6. Dicyclomine 20 mg t.i.d.  7. Lorazepam 0.5 mg as needed.  8.  Ventolin 90 mcg every 6 hours.  9. Prilosec 40 mg b.i.d.  10.Lisinopril hydrochlorothiazide 20/25 one tablet daily.  11.Lipitor 20 mg daily.   ALLERGIES:  NO KNOWN DRUG ALLERGIES.   FAMILY HISTORY:  End stage renal disease, hypertension.   SOCIAL HISTORY:  Lives in [place] with her husband of four years.  Denies tobacco, alcohol or illicit drug use.   REVIEW OF SYSTEMS:  Complete review of systems was performed, otherwise  negative other than noted in history of present illness.   PHYSICAL EXAMINATION:  VITAL SIGNS:  Blood pressure 104/50, temperature  96.7, pulse 64, respirations 22, saturating 100% on room air.  GENERAL:  This is an obese African American female in no acute distress.  HEENT:  Normocephalic, atraumatic.  Extraocular movements intact.  Pupils equal, round and reactive.  Oropharynx clear without erythema or  exudate.  Mucous membranes moist.  Nares are patent bilaterally.  Hearing grossly intact.  NECK:  Supple without lymphadenopathy or jugular venous distention.   LUNGS:  Clear to auscultation bilaterally without wheezes or rhonchi.  HEART:  Regular rate and rhythm, 3/6 systolic ejection murmur best heard  at the right sternal border.  No lower extremity edema.  ABDOMEN:  Soft, nontender, nondistended.  Bowel sounds are hypoactive.  RECTAL:  Melanotic stool that was grossly heme positive for ED attending  exam.  NEUROLOGICAL:  Cranial nerves II-XII intact.  Muscle strength is  symmetric.  Sensation is grossly normal.  MUSCULOSKELETAL:  No bony abnormalities, muscle tenderness or active  synovitis.  SKIN:  No rashes or lesions were noted.  MENTAL STATUS:  Alert and oriented x4.  Memory is intact to recent and  distant events.  Mood is appropriate.  Affect is full.   LABORATORY DATA:  CBC:  White blood cell 12.1, hemoglobin 4, platelets  185,000.  CMP:  Sodium 142, potassium 3.3, chloride 112, bicarbonate 21,  glucose 122, BUN 12, creatinine 0.8, T-bili 0.3, alk-phos 55, AST 15,  ALT 10, total protein 6.3, albumin 3.1, calcium 8.4, fecal occult blood  was positive.  PTT 27, INR 1.3, cardiac biomarkers less than 1.0.  For  CK troponins less than 0.5, myoglobin 52.1.  X-ray of the chest shows  parabronchial thickening with minimal atelectasis at the left base  laterally.   ASSESSMENT/PLAN:  This is a 70 year old female with recent diagnosis of  bronchitis, started on outpatient amoxicillin who presents with  shortness of breath and has what appears to be acute blood loss anemia  and hemorrhagic shock.  1. Acute blood loss anemia.  The patient has been volume expanded with      approximately two units of blood products and approximately four      liters of normal saline and is normotensive at this time.  We will      admit the patient to the medical intensive care unit for close      monitoring.  2. Upper GI blood loss.  At this point, GI has been consulted and      plans to perform upper endoscopy in the morning.  We will monitor      CBCs q.6 h  and transfuse for a goal hemoglobin of 10.  The patient      got 40 mg of IV Protonix in the emergency department.  We will      start her on  Protonix drip to run until completion and then switch      her to Protonix 40 mg b.i.d.  Will  also begin the patient on NuIron      150 mg b.i.d. for iron deficiency anemia.  Will make the patient      n.p.o. for now with plans to do upper endoscopy in the morning.  3. Hemorrhagic shock.  The patient has responded well to volume      expansion at this point.  Will continue to monitor her blood      pressures and vital signs in the intensive care unit and rule out      rule out myocardial infarctions considering this is an older      patient and was hypotensive for a period of time.  4. Hypertension.  Will hold all blood pressure medications at this      point, then reintroduce them as tolerated.  5. Bronchitis.  The patient with a slightly elevated white count,      reports subjective fevers at home with minimal improvement with      amoxicillin.  Will switch the patient to Avelox 400 mg IV daily.  6. Anxiety.  Will have Ativan p.r.n. as needed.  7. Hyperlipidemia.  Will continue her Zoloft.  8. Chronic back pain.  Will hold all NSAIDs at this point considering      her gastric bleeding.  9. Prophylaxis.  Knee high to SCDs.  10.Fluid/nutrition, normal saline at 75 cc an hour for now.  Monitor      electrolytes closely and n.p.o.  11.Ethics.  The patient requests to be a full code.      Adrienne Sender, MD  Electronically Signed     TJ/MEDQ  D:  12/05/2008  T:  12/05/2008  Job:  [id number]

## 2011-02-05 NOTE — Discharge Summary (Signed)
[name], Weeks              ACCOUNT NO.:  [id number]   MEDICAL RECORD NO.:  [id number]          PATIENT TYPE:  INP   LOCATION:  1524                         FACILITY:  [facility]   PHYSICIAN:  Hollice Espy, M.D.DATE OF BIRTH:  1941-04-22   DATE OF ADMISSION:  04/08/2008  DATE OF DISCHARGE:  04/11/2008                               DISCHARGE SUMMARY   PRIMARY CARE PHYSICIAN:  Deatra James, M.D. of [facility].   CONSULTS ON THIS CASE:  Bernette Redbird, M.D., [facility].   DISCHARGE DIAGNOSES:  1. Iron-deficiency anemia requiring transfusion.  2. Hematochezia.  3. 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  4. History of hypertension.  5. History of hyperlipidemia.  6. Anxiety.   DISCHARGE MEDICATIONS:  1. Verapamil 240 p.o. daily.  2. Omeprazole p.o. b.i.d.  3. Lisinopril/HCTZ 20/25 p.o. daily.  4. Lorazepam 0.5 p.o. p.r.n.  5. Lipitor 20 p.o. daily.  6. KCl 10 mEq p.o. daily.  (The patient is being advised to stop taking Anaprox p.r.n. and Goody's  powders p.r.n. and to avoid all NSAIDs).  1. For interim pain, she has been given a prescription for Percocet      5/325 p.o. q.6h. p.r.n.   DISPOSITION:  Improved.   DISCHARGE DIET:  Low-sodium diet.   HOSPITAL COURSE:  The patient is a 70 year old African-American female  with a past medical history of GI bleed from AVMs who presented to her  primary care physician's office with complaints of heme positive stool.  She was found to have anemia, and she had been taking a lot of Goody's  powders lately for chronic headaches and back pain.  She was admitted as  a direct admission, and she was found in the office to have a hemoglobin  of 6.6 when she arrived.  Dr. Matthias Hughs from [facility] was consulted, and  the patient underwent a blood transfusion on hospital day #2.  She  underwent an EGD which showed only a minimal hiatal hernia but no source  of melena, no bleeding, no  NSAID-induced gastropathy, and no evidence of  any residual AVMs.  No blood or coffee ground emesis was seen.  Dr.  Donavan Burnet plan for the patient was to have a capsule endoscopy study on  the following day and then be discharged to home and follow up in his  office.  However, the patient underwent an abdominal CT that day after  complaining of some continued pain in the right lower quadrant, and CT  of her abdomen and pelvis noted a markedly abnormal appearance of the  cecum and distal most ileum, concerning for neoplasm; specifically  markedly abnormal wall thickening.  Infectious inflammatory process  could create similar appearance.  The patient's CT scan was essentially  unremarkable.  With these findings, Dr. Matthias Hughs then felt the patient  should stay in the hospital, and she underwent a bowel prep, and then on  Monday, April 11, 2008, she underwent a colonoscopy by Dr. Evette Cristal, also of  [facility].  This colonoscopy noted an ulcerated cecum with ileocecal  valve.  Multiple  biopsies were taken, but preliminary, growths did not  look malignant.  With his ulceration, it was felt that could possibly be  the source of the bleeding.  Post procedure, the patient is doing well.  She is tolerating solid food.  Her hemoglobin has been steadily on the  rise, and as of April 11, 2008, her hemoglobin has improved from 10.3 up  to 10.6 today.  The plan will be for the patient to be discharged to  home, to stop taking all NSAIDs, p.r.n. Percocet for pain.  To continue  her other medications including PPI b.i.d. and to follow up in 1-2 weeks  with Dr. Matthias Hughs of GI.  At that time, he will recheck a hemoglobin.  If  it has dropped further, the patient may undergo a capsule endoscopy.  Also, at that time, biopsy results should be back for this colonoscopy.  The patient will then follow up with Dr. Wynelle Link in 2 weeks' time.   OVERALL DISPOSITION:  Improved.   ACTIVITY:  As tolerated.   FOLLOWUP:  She is  being discharged to home with follow up appointments  as above.      Hollice Espy, M.D.  Electronically Signed     SKK/MEDQ  D:  04/11/2008  T:  04/11/2008  Job:  [id number]   cc:   Bernette Redbird, M.D.  Fax: [phone]   Deatra James, M.D.  Fax: [phone]

## 2011-02-05 NOTE — Discharge Summary (Signed)
[name], [name]              ACCOUNT NO.:  [id number]   MEDICAL RECORD NO.:  [id number]          PATIENT TYPE:  INP   LOCATION:  1407                         FACILITY:  [facility]   PHYSICIAN:  Andres Shad. Rudean Curt, MD     DATE OF BIRTH:  1941/01/13   DATE OF ADMISSION:  02/18/2007  DATE OF DISCHARGE:  02/21/2007                               DISCHARGE SUMMARY   DISCHARGE DIAGNOSES:  1. Gastric AVM.  2. Hemorrhagic anemia.  3. H. pylori colonization.   MEDICATIONS ON DISCHARGE:  1. Iron sulfate 325 mg twice daily.  2. Prevpac twice daily for two weeks.  3. Omeprazole which is to be held while she is on the Prevpac and      restarted two weeks from now.  4. Lipitor.  5. Lisinopril.  6. Lorazepam.  7. Acitretin.  8. Calcium chloride.  9. Verapamil.   SUMMARY OF HOSPITALIZATION:  [name] is a 70 year old female with a  past medical history notable for hyperlipidemia, smoking and  hypertension who was admitted to the hospital on May 28 with severe  anemia.  She originally presented with melena which had been worsening  over several months but during the week before admission was  considerably worse and associated with fatigue.  When evaluated by her  primary care physician she was found to have a hemoglobin of 6.3 which  was confirmed in the emergency room.  She also was found to have a  microcytosis.   She was admitted to the hospital with the GI bleed and anemia due both  to gastrointestinal hemorrhage and iron deficiency.  She received a  blood transfusion which successfully restored her hemoglobin to safer  levels.  Gastroenterology was consulted and an upper endoscopy was  performed on May 29.  This revealed an AVM in the stomach that was  cauterized.  H. pylori antibody was sent and the patient was found to be  sero positive.  She tolerated advancement of her diet and did not have  any significant changes in her hemoglobin or bleeding events.  She was  stable for  discharged home at the time of this dictation on May 31.   ASSESSMENT AND PLAN:  1. Upper GI bleed due to gastric AVM.  Her AVM was cauterized and the      patient is no longer bleeding.  She is sero positive for H. pylori      and will be treated with a Prevpac for this condition.  She will      need to follow up      within one to two weeks with Dr. Wynelle Link to check her hemoglobin and      ensure that it has remained stable.  2. Iron deficiency anemia.  This may be due to chronic blood loss from      her AVM.  She has been prescribed iron sulfate 325 mg twice daily.      Andres Shad. Rudean Curt, MD  Electronically Signed     PML/MEDQ  D:  02/21/2007  T:  02/21/2007  Job:  [id number]   cc:  Deatra James, M.D.  Fax: [phone]   Graylin Shiver, M.D.  Fax: [phone]

## 2011-02-05 NOTE — Consult Note (Signed)
[name], [name]              ACCOUNT NO.:  [id number]   MEDICAL RECORD NO.:  [id number]          PATIENT TYPE:  INP   LOCATION:  5148                         FACILITY:  [facility]   PHYSICIAN:  Jake Bathe, MD      DATE OF BIRTH:  1941/04/20   DATE OF CONSULTATION:  12/09/2008  DATE OF DISCHARGE:                                 CONSULTATION   PRIMARY CARE PHYSICIAN:  Deatra James, MD, [facility].   REFERRING PHYSICIAN:  Kela Millin, MD, [facility].   REASON FOR CONSULTATION:  [name] is being seen at the request of  Dr. Suanne Marker for the evaluation of severe aortic stenosis in the setting  of recent gastric AVM and GI bleed.   HISTORY OF PRESENT ILLNESS:  A 70 year old female with history of  gastric AVMs, status post recurrent GI bleeds with hypertension,  hyperlipidemia, obesity, and smoking history, who was admitted on December 05, 2008, with severe dyspnea, hypotension, hemoglobin of 4 with  aggressive intravascular resuscitation with 6 units of packed red blood  cells.  She was evaluated by Dr. Madilyn Fireman of [facility] who performed EGD  which was normal and then perform a capsule endoscopy which demonstrated  gastric AVMs without any signs of bleeding.  In the past, she has had 2-  3 separate times of requiring transfusions secondary to bleeding.  She  has noted increased dyspnea as well as dyspnea on exertion at home over  the past 6 months.  Denies any syncope.  She did have a bout of  burning substernally approximately a week and half ago and concomitant  shortness of breath that was released slightly by sitting down.  She has  no prior cardiac history of myocardial infarction.  There is also no  family history of early myocardial infarction or specific valvular  disease.   An echocardiogram was obtained secondary to a chest x-ray yesterday  which demonstrated increased pulmonary edema as well as BNP in the 200  range.  A murmur was also detected, 3/6  systolic.  The echocardiogram  demonstrated a heavily calcified aortic valve possibly bicuspid with  hemodynamic quantification indicative of severe aortic valve stenosis.  Her mean valve gradient was 43 mmHg with a peak velocity greater than 4  meters per second.  She also has a normal ejection fraction with normal  wall thickness.  Her tricuspid regurgitant jet was also mildly elevated  indicative of mild to moderate elevated pulmonary pressures in the 35-45  systolic range.  She only had trivial mitral regurgitation without any  significant calcification of this valve.   I spoke with her today regarding this newly discovered condition of  aortic stenosis.  See below for plan.   PAST MEDICAL HISTORY:  1. 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  2. Hypertension.  3. Hyperlipidemia.  4. Anxiety.  5. Chronic headaches.  6. Chronic back pain.   ALLERGIES:  No known drug allergies.   MEDICATIONS AT HOME:  1. Potassium 10 mEq once a day.  2. Verapamil 240 mg once a day.  3.  Lisinopril/hydrochlorothiazide 20/25 mg once a day.  4. Lipitor 20 mg a day.  5. Prilosec 40 mg twice a day.  6. Lorazepam 0.5 mg p.r.n.  7. Dicyclomine 20 mg t.i.d.  8. Amoxicillin 875 mg b.i.d. (was given for presumed bronchitis).  9. Naprosyn 550 mg twice a day.  10.Tramadol 50 mg p.r.n.  11.Ventolin 90 mcg every 6 hours p.r.n.   SOCIAL HISTORY:  She denies any alcohol use.  However, she does admit to  occasional cigarettes currently.  She had more extensive smoking history  in the past.  Denies any illicit drug use.  She lives in [place] with  her husband for 4 years.   FAMILY HISTORY:  No early family history of cardiovascular of coronary  artery disease, however, she does have a history of end-stage renal  disease as well as hypertension.  Her grandmother died at age 100  secondary to a myocardial infarction.   REVIEW OF SYSTEMS:  Recent fever at home, GI bleed, abdominal  upset, no  significant joint pain or recent headaches.  Unless stated above, all  other 12 review of systems negative.   PHYSICAL EXAMINATION:  VITAL SIGNS:  Her blood pressure is 141/74,  respirations 17, and pulse in the 80s.  GENERAL:  Alert and oriented x3, in no acute distress, comfortable in  her chair.  EYES:  Well-perfused conjunctivae.  EOMI.  No scleral icterus.  NECK:  Supple.  Full range of motion.  No carotid bruits appreciated.  Delayed carotid upstrokes bilaterally.  No JVD.  No appreciable  thyromegaly or lymphadenopathy.  CARDIOVASCULAR:  Regular rate and rhythm with 3/6 crescendo-decrescendo  systolic murmur heard best at right upper sternal border.  Normal PMI.  LUNGS:  Clear to auscultation bilaterally with normal respiratory  effort.  No wheezes.  ABDOMEN:  Soft, nontender, normoactive bowel sounds, mildly obese,  nontender in the epigastric region.  EXTREMITIES:  No clubbing, cyanosis, or edema.  2+ distal pulses.  2+  femoral pulses detected.  SKIN:  Warm, dry, and intact.  No rashes.  NEUROLOGIC:  Nonfocal.  No tremors.  Normal gait.  PSYCH:  Normal affect.   LABORATORY DATA:  Chest x-ray personally reviewed from December 05, 2008,  demonstrates increased interval pulmonary edema, from December 04, 2008,  shows bronchitic changes, otherwise normal heart size.  ECG showed sinus  rhythm with nonspecific ST-T wave changes.  On one ECG, there was a QTc  of 507 seconds.  On a repeat ECG, prolonged QT was not demonstrated.  Occasional PVCs noted.  Echocardiogram as described above in HPI  demonstrative of severe aortic stenosis with normal ejection fraction.  Sodium 143, potassium 3.7, CO2 of 22, BUN 13, creatinine 0.8, and  glucose 79.  White count 12.4, down from 20 on admission; hemoglobin  9.9, stable, up from 4 on admission; hematocrit 29.7; and platelets 161.  BNP 233.  Urine culture, no growth to date.   ASSESSMENT/PLAN:  A 70 year old female with newly  discovered severe  aortic stenosis likely secondary to bicuspid aortic valve with recurrent  gastrointestinal bleeds/gastric arteriovenous malformations,  hypertension, hyperlipidemia, tobacco use, and obesity.  1. Severe aortic stenosis - I will consult CVTS for evaluation of      aortic valve replacement.  We will proceed with cardiac      catheterization on Monday afternoon to detect any evidence of      coronary artery disease prior to surgery.  I would rather her stay      here  over the weekend to make sure that she maintains her      hematocrit prior to cardiac catheterization.  She is amenable to      this.  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      This is felt to be secondary to degradation of von Willebrand's      factor through the turbulent aortic valve jet.  Potentially with      aortic valve replacement, she may demonstrate decreased in GI      bleeding.  2. Gastrointestinal bleed - currently stable, Dr. Madilyn Fireman following,      status post EGD and capsule endoscopy indicative of gastric AVM, no      current bleeding.  The patient has no melena.  3. Hyperlipidemia - on Lipitor at home, on Zocor here.  4. Hypertension - currently stable, off all meds.  She was hypotensive      on admission with systolic blood pressure in the 60s prior to      aggressive resuscitation.  We will follow along with you.  Risk and      benefits of cardiac catheterization including stroke, heart attack,      death, and renal impairment have been explained to the patient at      length.      Jake Bathe, MD  Electronically Signed     MCS/MEDQ  D:  12/09/2008  T:  12/10/2008  Job:  [id number]   cc:   Deatra James, M.D.

## 2011-02-05 NOTE — Consult Note (Signed)
[name], [name]              ACCOUNT NO.:  [id number]   MEDICAL RECORD NO.:  [id number]          PATIENT TYPE:  INP   LOCATION:  2908                         FACILITY:  [facility]   PHYSICIAN:  John C. Madilyn Fireman, M.D.    DATE OF BIRTH:  [birth year]   DATE OF CONSULTATION:  12/05/2008  DATE OF DISCHARGE:                                 CONSULTATION   PRIMARY CARE PHYSICIAN:  Deatra James, MD   REFERRING PHYSICIAN:  Sabino Donovan, MD   HISTORY OF PRESENT ILLNESS:  This is a 70 year old female with a history  of upper and lower occult GI bleeding who presented with shortness of  breath and a hemoglobin of 4 yesterday.  She reports that 10 days ago,  she was diagnosed with bronchitis and treated with amoxicillin.  She did  not improve.  The patient reports no pain, no vomiting, no diarrhea, or  frank blood.  She takes NSAIDs for headaches.  She recently quit smoking  and reports regular dark stools, some of which were black.  She also  reports anorexia and nausea and now tells me that she has some  periumbilical abdominal pain that is continuous but no obvious weight  loss.  She still feels short of breath.  Her hemoglobin is currently 7.5  after 6 units of packed red blood cells.  Her last upper endoscopy was  in July 2009 and found to be normal.  Her last colonoscopy was July  2009.  She was found to have an ulcerated cecum and IC valve.   PAST MEDICAL HISTORY:  Significant for chronic diverticulitis.  She is  status post rectosigmoid colectomy.  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  She is status post APC x2.  She has a  history of hyperlipidemia, hypertension, anxiety, chronic headaches, and  chronic back pain.   CURRENT MEDICATIONS:  1. Naproxen 550 mg b.i.d.  2. Prilosec.   ALLERGIES:  She has no known drug allergies.   REVIEW OF SYSTEMS:  Negative for weight loss and palpitations.  She is  short of breath.  She reports fever approximately 10 days  ago.   SOCIAL HISTORY:  Negative for alcohol and drugs.  She is an ex-smoker.  She lives with her husband.   FAMILY HISTORY:  Negative for colon cancer and ulcer disease.   PHYSICAL EXAMINATION:  GENERAL:  She is alert and oriented in no  apparent distress.  VITAL SIGNS:  Her pulse is 79, respirations 20, and blood pressure is  102/79.  HEART:  Regular rate and rhythm.  LUNGS:  Clear.  ABDOMEN:  Soft, nontender, and nondistended with good bowel sounds.   LABORATORY DATA:  Hemoglobin 7.8, hematocrit 23.0, white count 20.3, and  platelets 160,000.  BUN 11, creatinine 0.70, and potassium 3.1.  Chest x-  ray just shows bronchitic changes.   ASSESSMENT:  Dr. Dorena Cookey has seen and examined the patient, collected  a history, and reviewed the chart.  His impression is that she has a  gastrointestinal bleeding of uncertain etiology at this point along with  acute blood loss anemia, white count of 20.3, and chronic headaches.  I  agree with IV Protonix.  We will plan for upper endoscopy this morning.   Thanks very much for this consultation.      Stephani Police, PA    ______________________________  Everardo All Madilyn Fireman, M.D.    MLY/MEDQ  D:  12/05/2008  T:  12/06/2008  Job:  [id number]   cc:   Deatra James, M.D.

## 2011-02-05 NOTE — Op Note (Signed)
[name], [name]              ACCOUNT NO.:  [id number]   MEDICAL RECORD NO.:  [id number]          PATIENT TYPE:  INP   LOCATION:  1407                         FACILITY:  [facility]   PHYSICIAN:  John C. Madilyn Fireman, M.D.    DATE OF BIRTH:  August 10, 1941   DATE OF PROCEDURE:  02/19/2007  DATE OF DISCHARGE:                               OPERATIVE REPORT   PROCEDURE:  Esophagogastroduodenoscopy with laser therapy.   INDICATIONS FOR PROCEDURE:  GI bleeding.   PROCEDURE:  The patient was placed in the left lateral decubitus  position and placed on the pulse monitor with continuous low-flow oxygen  delivered by nasal cannula.  She was sedated with 7.5 mg IV Versed and  75 mcg IV fentanyl.  The Olympus video endoscope was advanced under  direct vision into the oropharynx and esophagus.  The esophagus was  straight and of normal caliber with the squamocolumnar line at 37 cm  above a 2 cm hiatal hernia. There was no visible esophagitis or other  abnormality of the GE junction.  The stomach was entered and a small of  liquid secretions were suctioned from the fundus.  Retroflexed view of  the cardia confirmed a hiatal hernia and was otherwise unremarkable.  The fundus and body appeared normal with the exception of a large  approximately 6-7 mm in diameter AVM in the body.  This was fulgurated  to apparent complete obliteration with the argon plasma coagulator.  There were 1 or 2 tiny AVMs in the proximal and distal stomach that were  not addressed. The body and antrum appeared normal with no ulceration  noted.  The duodenum was entered and both the bulb and second portion  were well inspected and appeared to be within normal limits.  The scope  was then withdrawn and the patient returned to the recovery room in  stable condition.  She tolerated the procedure well and there were no  immediate complications.   IMPRESSION:  1. Large gastric arteriovenous malformations with 2 tiny arteriovenous  malformations, the larger one fulgurated with the laser.  Plan:      Continue proton pump inhibitor.  2. Check H pylori antibody.  3. Monitor stools and hemoglobin.  4. Consider capsule endoscopy.  5. Review results of previous colonoscopy.           ______________________________  Everardo All. Madilyn Fireman, M.D.     JCH/MEDQ  D:  02/19/2007  T:  02/19/2007  Job:  [id number]   cc:   Deatra James, M.D.  Fax: [phone]

## 2011-02-05 NOTE — H&P (Signed)
NAMETHEDA, PAYER              ACCOUNT NO.:  [id number]   MEDICAL RECORD NO.:  [id number]          PATIENT TYPE:  EMS   LOCATION:  ED                           FACILITY:  [facility]   PHYSICIAN:  Hollice Espy, M.D.DATE OF BIRTH:  08-Mar-1941   DATE OF ADMISSION:  02/18/2007  DATE OF DISCHARGE:                              HISTORY & PHYSICAL   PRIMARY CARE PHYSICIAN:  Dr. Wynelle Link, at [facility].   CONSULTATIONS:  Vida Rigger, M.D., [facility].   CHIEF COMPLAINT:  Abdominal pain and low blood.   HISTORY OF PRESENT ILLNESS:  The patient is a 70 year old African-  American female with past medical history of hypertension and  hyperlipidemia, who presents to the emergency room after being sent over  from her PCP's office with low blood levels. The patient tells me that  she has been previously relatively well. She had a colonoscopy done 1 to  2 years ago, which was normal, done by [facility]. She said that she has  noticed some occasional black tarry stools over the last couple of  months but she noticed a large amount in the last few days. She also  tells me that for the last several weeks, she has been having increased  fatigue and dyspnea on exertion and she finally could not take it  anymore and came to the emergency room for further evaluation. Of  course, she went to go see her PCP for further evaluation. The patient  had labs done in the office, which noted a hemoglobin of 6.6. She was  then sent over from [facility] to the emergency room for evaluation and  admission. The patient's hemoglobin was noted to be 6.3 in the emergency  room with an MCV of 70. She was also noted to be slightly hypotensive  with a blood pressure initially of 107/63 but stopped slightly to 97/55.  Her saturations were 99% on room air. Her heart rate was staying in the  80's. Currently, the patient is doing well. She denies any headaches,  vision changes, dysphagia, chest pain, palpitations, shortness of  breath,  wheezing, or coughing. She complains of some mild abdominal pain  located in the mid epigastric area, radiating over to her right side.  She denies any hematuria or dysuria. No constipation or diarrhea but she  has noted an excessive amount of black stool in the last few days. She  denies any focal extremity numbness, weakness, or pain. Her review of  systems is otherwise negative other than some gene4ralized weakness.   PAST MEDICAL HISTORY:  Includes hypertension, hyperlipidemia, anxiety.   MEDICATIONS:  She is on Lipitor, Lisinopril, Lorazepam, Omeprazole,  potassium chloride, Verapamil.   ALLERGIES:  NO KNOWN DRUG ALLERGIES.   SOCIAL HISTORY:  She denies any alcohol or drug use. She does smoke 1/2  pack per day.   FAMILY HISTORY:  Noncontributory. No history of anemia.   PHYSICAL EXAMINATION:  VITAL SIGNS:  On admission temperature 98.8,  heart rate 84, blood pressure initially 107/63, now down to 97/55.  Respiratory rate 18, O2 saturation 99% on room air.  GENERAL:  Alert  and oriented times three. No apparent distress.  HEENT:  Normocephalic and atraumatic. Mucous membranes are dry. She has  no carotid bruits.  HEART:  Regular rate and rhythm. S1 and S2. She has a 2 out of 6  holosystolic murmur.  LUNGS:  Clear to auscultation bilaterally.  ABDOMEN:  Soft. Some mild tenderness in the mid epigastric area.  Otherwise, nondistended. Positive bowel sounds.  EXTREMITIES:  No clubbing, cyanosis. Trace pitting edema.   LABORATORY DATA:  White count 9.9. Hemoglobin and hematocrit 6.3 and 21.  MCV of 70. Platelet count 267,000. No shift. C-met, urinalysis are  pending. I have ordered a type and cross for transfusion.   ASSESSMENT/PLAN:  1. Suspected upper gastrointestinal bleed. I have already discussed      this case with Dr. Ewing Schlein of [facility]. I will make the patient NPO.      A few ice chips. IV Protonix. Transfuse 2 units of packed red blood      cells and follow serial  hemoglobin and hematocrit after      transfusion. Plan for EGD in the morning. Likely, underlying cause      is existing anemia plus NSAID induced bleeding ulcer.  2. Microcytic anemia, which may have been an underlying factor prior      to this. Will check iron, TIBC, and ferritin. The patient tells me      that she has no previous history of anemia.  3. Tobacco abuse. Nicotine patch provided.  4. Hypertension. Holding her medications because she is hypotensive.  5. Hyperlipidemia. Holding her medications for NPO.      Hollice Espy, M.D.  Electronically Signed     SKK/MEDQ  D:  02/18/2007  T:  02/18/2007  Job:  [id number]   cc:   Petra Kuba, M.D.  Fax: [phone]   Deatra James, M.D.  Fax: [phone]

## 2011-02-05 NOTE — Op Note (Signed)
[name], [name]              ACCOUNT NO.:  [id number]   MEDICAL RECORD NO.:  [id number]          PATIENT TYPE:  INP   LOCATION:  2301                         FACILITY:  [facility]   PHYSICIAN:  Evelene Croon, M.D.     DATE OF BIRTH:  December 16, 1940   DATE OF PROCEDURE:  12/29/2008  DATE OF DISCHARGE:                               OPERATIVE REPORT   PREOPERATIVE DIAGNOSES:  1. Three-vessel coronary artery disease.  2. Severe aortic stenosis.   POSTOPERATIVE DIAGNOSES:  1. Three-vessel coronary artery disease.  2. Severe aortic stenosis.   OPERATION:  Median sternotomy, extracorporeal circulation, coronary  artery bypass graft surgery x2 using saphenous vein grafts to the  diagonal branch of the left anterior descending and the obtuse marginal  branch of the left circumflex coronary artery.  Aortic valve replacement  using a 21-mm Edwards pericardial valve.  Endoscopic vein harvesting  from the right leg.   ATTENDING SURGEON:  Evelene Croon, MD   ASSISTANT:  Rowe Clack, PA-C   ANESTHESIA:  General endotracheal.   CLINICAL HISTORY:  This patient is a 70 year old woman with history of  hypertension and hyperlipidemia, who was admitted on December 05, 2008,  after presenting with several-week history of shortness of breath that  did not improve with treatment for bronchitis.  On admission, she was  hypotensive and noted to have a hemoglobin of 4.  She was transfused and  fluid resuscitated.  She had been having dark stools, but denied any  melena or bright red blood per rectum.  She had been taking a lot of  nonsteroidal anti-inflammatory agents for headache.  She underwent upper  endoscopy due to her previous history of gastric AVMs with GI bleeding.  Upper endoscopy was negative.  She subsequently underwent a capsule  endoscopy, which showed several nonbleeding AVMs, one being in the  distal duodenum and proximal jejunum and 4-5 in the mid-to-distal small  bowel.  She had a  slightly elevated BNP and underwent a 2-D  echocardiogram, which showed severe aortic stenosis with an aortic valve  area of 0.78 cm2 and a mean gradient of 43 with a peak gradient of 67.  The aortic valve was markedly calcified and poorly mobile leaflets and  it was felt possibly be bicuspid.  Ejection fraction was 60%.  She  subsequently underwent cardiac catheterization by Dr. Donato Schultz.  This  showed three-vessel coronary artery disease.  The left main had some  calcification, but no significant disease.  The LAD had heavy proximal  calcification.  At the second diagonal branch, there was about 40%  stenosis in the LAD at the bifurcation and the diagonal branch had about  50-60% ostial stenosis.  The left circumflex had 1 large obtuse marginal  branch.  The bifurcation of this first marginal branch and the AV groove  circumflex, there was about 60% stenosis.  The right coronary artery was  a small and nondominant vessel that did give rise to the posterior  descending artery.  It was moderate diffuse disease up to about 50%  throughout the vessel.  Left ventricular function was not  assessed.  There were normal right-sided heart pressures and no evidence of  pulmonary hypertension.  After review of the catheterization and  echocardiogram findings, it was felt that the aortic valve replacement  and coronary artery bypass graft surgery would be the best treatment to  prevent further progression of her symptoms and left ventricular  deterioration.  It was also felt there may be some association between  her bleeding gastrointestinal AVMs and her aortic stenosis and this may  resolve with replacement of her valve.  I felt the stenosis in the  diagonal branch and left circumflex were significant and should be  bypassed.  These stenoses were at the ostium of those branches and would  be a very difficult to treat percutaneously in the future.  The LAD  stenosis was not significant and I felt  that if I place the mammary  artery to that the LAD, it would probably become atretic.  This stenosis  was in a fairly localized region and a straight shot for percutaneous  intervention if it was ever needed.  The right coronary artery was small  and diffusely diseased and I did not feel it would be necessary to  bypass that vessel with only about 40-50% stenosis.  I discussed the  operative procedure of coronary artery bypass surgery and aortic valve  replacement with the patient and her family.  We discussed the pros and  cons of mechanical and tissue valves and a strong recommendation for  tissue valve given her history of GI bleeding.  I discussed the benefits  and risks of surgery including, but not limited to bleeding, blood  transfusion, infection, stroke, myocardial infarction, graft failure,  heart block requiring permanent pacemaker, and death.  She understood  all this and agreed to proceed.   OPERATIVE PROCEDURE:  The patient was taken to the operating room and  placed on the table in supine position.  After induction of general  endotracheal anesthesia, a Foley catheter was placed in the bladder  using sterile technique.  Then, the chest, abdomen, and both lower  extremities were prepped and draped in usual sterile manner.  Transesophageal echocardiogram was performed by Anesthesiology.  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  There was  poor leaflet mobility and Doppler signal was consistent with severe  aortic stenosis.  There was mild poststenotic dilatation of the  ascending aorta.  Left ventricular function appeared well preserved with  moderate left ventricular hypertrophy.  There was no significant mitral  regurgitation and no tricuspid regurgitation.  IR function appeared  normal.   Then, the chest was opened through a median sternotomy incision.  The  pericardium was opened in the midline.  Examination of the heart showed  good  ventricular contractility.  The ascending aorta was of normal  length and had mild poststenotic dilatation in the ascending aorta.  The  caliber measured about 3 cm in maximum diameter.  There were no palpable  plaques in the ascending aorta, but there were some palpable plaque at  the take off the innominate artery.   At the same time, a segment of greater saphenous vein was harvested from  the right leg using endoscopic vein harvest technique.  This vein was of  medium size and good quality.   Then, the patient was heparinized and when an adequate activated  clotting time was achieved, the distal ascending aorta was cannulated  using a 20-French aortic cannula for arterial inflow.  Venous outflow  was achieved using a 2-stage venous cannula through the right atrial  appendage.  An antegrade cardioplegia and vent cannula was inserted in  the aortic root.  The left ventricular vent was placed in the right  superior pulmonary vein and retrograde cardioplegic cannula through a  purse-string suture in the right atrium.  The patient was placed on  cardiopulmonary bypass.  The distal coronaries were identified.  The  obtuse marginal branch was intramyocardial along its proximal and  midportions.  It became epicardial distally after the bifurcation.  The  vessel was located in its midportion or could be seen under the surface  of muscle.  It was a large graftable vessel in this area.  The diagonal  branch was a moderate-sized graftable vessel.   Then, the aorta was cross-clamped and 500 mL of cold blood antegrade  cardioplegia was administered in the aortic root with quick arrest of  the heart.  Systemic hypothermia to 20 degrees centigrade and topical  hypothermic iced saline was used.  Temperature probe was placed in the  septum and insulating pad in the pericardium.   Additional doses of retrograde cardioplegia were given about 20-minute  intervals to maintain myocardial temperature  around 10 degrees  centigrade.  The first distal anastomosis was performed to the obtuse  marginal branch.  The internal diameter was about 2 mm.  The conduit  used was a segment of greater saphenous vein and the anastomosis  performed in an end-to-side manner using continuous 7-0 Prolene suture.  Flow was noted through the graft was excellent.   The second distal anastomosis was performed to the diagonal branch.  The  internal diameter was about 1.6 mm.  The conduit used was a second  segment of greater saphenous vein.  The vein was sewn to it in an end-to-  side manner using continuous 7-0 Prolene suture.  Flow was noted through  the graft was excellent.   Then, attention was turned to aortic valve replacement.  The aorta was  opened transversely at the level of sinotubular junction.  Examination  of the native valve showed there was a true bicuspid valve.  The  commissure between the traditional left coronary cusp and noncoronary  cusps was present.  The commissure between the traditional left and  right coronary cusp was present.  The other commissure was absent.  The  native valve was severely stenotic and calcified.  The native valve was  excised.  Care was taken to remove all particulate debris.  The annulus  was minimally calcified, was somewhat thickened and sclerotic.  The  calcium deposits were removed carefully.  Then, the left ventricle and  ascending aorta were irrigated with iced saline solution.  Then, the  annulus was sized and a 21-mm Edwards pericardial tissue heart valve was  chosen.  This had model number [id number]  and serial number [id number].  Then, a series of pledgeted 2-0 Ethibond horizontal mattress sutures  were placed around the annulus with the pledgets in the subannular  position.  The sutures were placed through the sewing ring and the valve  lowered in place.  The sutures were tied sequentially.  The valve seated  nicely.  The coronary ostia were identified  and were not obstructed.  Then, the patient rewarmed to 37 degrees centigrade.  The aortotomy was  closed in 2 layers using continuous 4-0 Prolene suture with felt strips  to reinforce the aortic closure.  The closure was then covered lightly  with CoSeal for  hemostasis.  Then, the 2 proximal vein graft anastomosis  were performed through the ascending aorta using continuous 6-0 Prolene  suture in an end-to-side manner.  Then, the left side of the heart was  de-aired.  The head was placed in Trendelenburg position and a cross-  clamp removed with time of 113 minutes.  There was spontaneous return of  sinus rhythm.  The proximal and distal anastomoses appeared hemostatic  and allowed the grafts satisfactory.  A graft marker was placed around  the proximal anastomoses.  Two temporary right ventricular and right  atrial pacing wires were placed and brought out through the skin.   After full de-airing maneuvers, the patient was weaned from  cardiopulmonary bypass on no inotropic agents.  Total bypass time was  135 minutes.  Cardiac function appeared excellent.  Cardiac output of 4  L/min.  Transesophageal echocardiogram showed normal functioning aortic  valve prosthesis.  There was no insufficiency or perivalvular leak.  There was no mitral regurgitation.  Left ventricular function appeared  well preserved.  Then, protamine was given the venous and aortic  cannulas were removed without difficulty.  Hemostasis was achieved.  Then, 2 chest tubes were placed with tube in the posterior pericardium,  one in the anterior mediastinum.  The sternum was closed with #6  stainless steel wires.  The fascia was closed with continuous #1 Vicryl  suture.  Subcutaneous tissue was closed with continuous 2-0 Vicryl, and  the skin with a 3-0 Vicryl subcuticular closure.  The lower extremity  vein harvest site was closed in layers in a similar manner.  The sponge,  needle, and instrument counts were correct  according to the scrub nurse.  Dry sterile dressing applied over the incisions around the chest tubes,  which were hooked to Pleur-Evac suction.  The patient remained  hemodynamically stable and transported to the SICU in guarded but stable  condition.      Evelene Croon, M.D.  Electronically Signed     BB/MEDQ  D:  12/29/2008  T:  12/30/2008  Job:  [id number]   cc:   Jake Bathe, MD

## 2011-02-05 NOTE — Op Note (Signed)
[name], [name]              ACCOUNT NO.:  [id number]   MEDICAL RECORD NO.:  [id number]          PATIENT TYPE:  INP   LOCATION:  1524                         FACILITY:  [facility]   PHYSICIAN:  Graylin Shiver, M.D.   DATE OF BIRTH:  1941/03/09   DATE OF PROCEDURE:  04/11/2008  DATE OF DISCHARGE:                               OPERATIVE REPORT   PROCEDURE:  Colonoscopy with biopsy.   REASON FOR PROCEDURE:  Recurring anemia with heme-positive stool.  The  patient has a prior history of a gastric AVM.  This was cauterized in  the past.  The patient was on supplemental iron therapy with improvement  of her hemoglobin.  She had a normal colonoscopy in 2007.  She presents  to the hospital with anemia.  Recent EGD done this past weekend by Dr.  Matthias Hughs did not reveal any upper GI source for anemia.  A CT scan of the  abdomen was done which showed markedly abnormal appearance of cecum and  distal-most ileum worrisome for neoplasm.  Infectious or inflammatory  process could also create this picture.  The patient has a history of  taking BC Powder daily for aches and pains.  Colonoscopy is being done  to further evaluate.   Informed consent was obtained after explanation of the risks of  bleeding, infection and perforation.   PREMEDICATION:  Fentanyl 50 mcg IV, Versed 7 mg IV.   PROCEDURE:  With the patient in the left lateral decubitus position, the  Pentax colonoscope was inserted into the rectum and advanced around the  colon to the cecum.  The cecum was identified by the appendiceal orifice  area and the ileocecal valve.  The ileocecal valve on first appearance  appeared ulcerated.  There was some surrounding erythematous mucosa.  With further insufflation of the cecum I could see ulcer extending from  around the ileocecal valve and down more into the lateral walls of the  cecum.  There was surrounding erythematous mucosa.  No obvious tumor was  seen.  I could not advance the scope  into the terminal ileum although I  did get a view of the opening of the ileocecal valve and could see what  appeared to be some of the frond-like villi and was able to photograph  this on image #6.  Multiple biopsies were obtained from the affected  area in the cecum and ileocecal valve for histological inspection.  The  ascending colon looked normal.  The transverse colon looked normal.  The  descending colon and left colon looked normal.  The rectum looked  normal.  The scope was retroflexed in the rectum and no abnormalities  were seen.  The patient does have a history of a prior sigmoid  colectomy.   IMPRESSION:  Ulcerated cecum and ileocecal valve.  Multiple biopsies  were obtained.  Grossly this does not look malignant but biopsies will  be checked.  I was unable to advance the scope into the terminal ileum  due to the surrounding ulcerated tissue.           ______________________________  Graylin Shiver,  M.D.    SFG/MEDQ  D:  04/11/2008  T:  04/11/2008  Job:  [id number]   cc:   Deatra James, M.D.  Fax: [phone]

## 2011-02-05 NOTE — Op Note (Signed)
[name], [name]              ACCOUNT NO.:  [id number]   MEDICAL RECORD NO.:  [id number]          PATIENT TYPE:  INP   LOCATION:  2908                         FACILITY:  [facility]   PHYSICIAN:  John C. Madilyn Fireman, M.D.    DATE OF BIRTH:  1941-03-28   DATE OF PROCEDURE:  12/05/2008  DATE OF DISCHARGE:                               OPERATIVE REPORT   INDICATIONS FOR PROCEDURE:  Anemia and heme-positive stools.   PROCEDURE IN DETAIL:  The patient was placed in the left lateral  decubitus position and placed on the pulse monitor with continuous low-  flow oxygen delivered by nasal cannula.  He was sedated with 50 mcg of  IV fentanyl and 5 mg of IV Versed.  The Olympus video endoscope was  advanced under direct vision into the oropharynx and esophagus.  The  esophagus was straight of normal caliber with the squamocolumnar line at  38 cm.  There was no visible hiatal hernia or ring stricture or other  abnormality over the GE junction.  The stomach was entered and small  amount of liquid secretions were suctioned from the fundus.  Retroflexed  view of cardia was unremarkable.  The fundus, body, antrum, and pylorus  all appeared normal.  The duodenum was entered and both the bulb and  second portion were inspected and appeared to be within normal limits.  The scope was then withdrawn and the patient returned to the recovery  room in stable condition.  She tolerated the procedure well.  There were  no immediate complications.   IMPRESSION:  Normal endoscopy.   PLAN:  We will proceed with capsule endoscopy of the small bowel due to  her recurrent bleeding of obscure etiology.           ______________________________  Everardo All Madilyn Fireman, M.D.     JCH/MEDQ  D:  12/05/2008  T:  12/06/2008  Job:  [id number]

## 2011-02-05 NOTE — Assessment & Plan Note (Signed)
OFFICE VISIT   Weeks, [name]  DOB:  June 16, 1941                                        Jan 24, 2009  CHART #:  [id number]   The patient returned today for followup, status post coronary artery  bypass graft surgery x2 and aortic valve replacement with a 21-mm  Edwards pericardial valve on December 29, 2008.  She had a history of GI  bleeding with anemia preoperatively felt to be due to gastrointestinal  AV malformations.  She had an uncomplicated postoperative course.  She  just saw Dr. Anne Fu this past Monday and scheduled for some test  tomorrow.  She said she has been walking daily without any chest pain or  shortness of breath.   PHYSICAL EXAMINATION:  VITAL SIGNS:  Today her blood pressure 179/89,  her pulse is 92 and regular.  Respiratory rate is 20 and unlabored.  Oxygen saturation on room air is 97%.  GENERAL:  She looks well.  CARDIAC:  Regular rate and rhythm with a grade 2/6 systolic flow murmur  that is loudest along the left sternal border.  There is no diastolic  murmur or rub.  LUNGS:  Clear.  CHEST:  The chest incision is healing well and the sternum is stable.  EXTREMITIES:  Her right leg incision has a small amount of serous  drainage.  There is mild erythema along the edges of the incision.  There is mild tenderness around the area.  There is a palpable  subcutaneous hematoma within the saphenous vein tunnel in the right  thigh.  She does have mild-to-moderate right lower leg and ankle edema.   Followup chest x-ray shows clear lung fields and no pleural effusions.   Her medications are Lopressor 25 mg b.i.d., omeprazole 40 mg b.i.d.,  iron 150 mg b.i.d., potassium 10 mEq daily, Lipitor 20 mg daily,  lorazepam p.r.n., Lortab p.r.n. for pain, and vitamin daily.   IMPRESSION:  Overall, the patient is making a satisfactory recovery  following her surgery.  She does have small amount of drainage and mild  cellulitis around her right leg  saphenous vein harvest incision and I  started her on Keflex 500 mg t.i.d. x7 days which should resolve this.  There is no sign of purulence.  She has some swelling in that leg which  is not uncommon after vein harvest and I would expect this to gradually  improve.  She does have a systolic flow murmur which is probably across  the aortic valve prosthesis.  There is no diastolic murmur.  I told her  she could return to driving a car when she feels comfortable with that,  but should refrain from lifting anything heavier than 10 pounds for a  total of 3 months from date of surgery.  She will continue to follow up  with Dr. Anne Fu and will contact me if she develops any further problem  with her incisions or if the right leg incision does not heal up over  the next 2 weeks.   Evelene Croon, M.D.  Electronically Signed   BB/MEDQ  D:  01/24/2009  T:  01/25/2009  Job:  [id number]   cc:   Jake Bathe, MD

## 2011-02-05 NOTE — H&P (Signed)
NAME:  [name], [name]              ACCOUNT NO.:  [id number]   MEDICAL RECORD NO.:  [id number]          PATIENT TYPE:  INP   LOCATION:  1524                         FACILITY:  [facility]   PHYSICIAN:  Corinna L. Lendell Caprice, MDDATE OF BIRTH:  [birth year]   DATE OF ADMISSION:  04/08/2008  DATE OF DISCHARGE:                              HISTORY & PHYSICAL   CHIEF COMPLAINT:  Abdominal discomfort.   HISTORY OF PRESENT ILLNESS:  [name] is a 70 year old black female  patient who was directly admitted from Dr. Chase Caller office with anemia and  heme-positive stool.  She has a history of GI bleed in the past from  gastric arterial venous malformations.  She has had them coagulated  twice in the past year and half.  She had been on iron therapy, but her  hemoglobins were adequate and so she was told to stop this recently.  She has had abdominal bloating, discomfort and has taken Gas-X without  relief.  Her symptoms started sometime before July 4.  She takes Marlin Canary  powders about twice a day for chronic headaches and back pain.  She has  been feeling very tired recently.  She has had no chest pain.  She has  had dyspnea on exertion.  She reports that her stool is always black  and that it has not changed recently.  She has a history of H pylori  which was treated.  She has had a colonoscopy within the past year or  two which showed no abnormalities.   PAST MEDICAL HISTORY:  1. Hypertension.  2. Gastric AVMs with GI bleed.  3. Hyperlipidemia.  4. Anxiety.  5. Chronic headaches.  6. Chronic back pain.   MEDICATIONS:  1. Verapamil 240 mg a day.  2. Lisinopril/hydrochlorothiazide 20/25 mg a day.  3. Omeprazole twice a day.  4. Lorazepam 0.5 mg as needed for anxiety.  5. Lipitor 20 mg a day.  6. KCl 10 mEq a day.   SOCIAL HISTORY:  The patient has a previous history of smoking.  She  quit last year.  She is here with her daughter.  She does not drink  alcohol.  She has retired.   FAMILY  HISTORY:  Family history is reviewed and as per previous.   REVIEW OF SYSTEMS:  As above; otherwise negative.   PHYSICAL EXAMINATION:  VITAL SIGNS:  Her blood pressure in the office  was 128/70.  Her temperature was 99.3.  No vital signs have been  recorded here on the Unit yet.  GENERAL:  In general, the patient is well-nourished, well-developed, in  no acute distress.  HEENT:  Normocephalic, atraumatic.  Pupils equal, round, reactive to  light.  She has pale conjunctivae.  Moist mucous membranes.  NECK:  Supple.  No lymphadenopathy.  LUNGS:  Clear to auscultation bilaterally without wheezes, rhonchi or  rales.  CARDIOVASCULAR:  Regular rate and rhythm without murmurs, gallops or  rubs.  ABDOMEN:  Normal bowel sounds.  She does have some abdominal tenderness,  particularly in the upper abdomen.  GU:  Deferred.  RECTAL:  Rectal exam per Dr. Wynelle Link shows  heme-positive stool.  EXTREMITIES:  No clubbing, cyanosis or edema.  SKIN:  No rash.  PSYCHIATRIC:  Normal affect.   LABORATORY DATA:  Hemoglobin in the office was 6.6 in June.  Her  hemoglobin was normal.   ASSESSMENT/PLAN:  1. Anemia with abdominal bloating and pain and heme-positive stool.  I      will check a stat CBC, CMET, amylase, lipase, PT/PTT, iron level.      Transfuse if hemoglobin less than 8.  I will give Protonix.  I have      consulted Dr. Matthias Hughs.  She will get serial hemoglobins,      hematocrits.  2. History of gastric arteriovenous malformations.  3. Osteoarthritis and chronic headaches.  4. Treated H pylori.  5. Gastroesophageal reflux disease.  6. Anxiety.  7. Hyperlipidemia.  8. Cataracts.  9. Status post cholecystectomy.      Corinna L. Lendell Caprice, MD  Electronically Signed     CLS/MEDQ  D:  04/08/2008  T:  04/09/2008  Job:  [id number]   cc:   Deatra James, M.D.  Fax: [phone]   Graylin Shiver, M.D.  Fax: [phone]

## 2011-02-05 NOTE — Discharge Summary (Signed)
NAMEJERONICA, STLOUIS              ACCOUNT NO.:  [id number]   MEDICAL RECORD NO.:  [id number]          PATIENT TYPE:  INP   LOCATION:  2029                         FACILITY:  [facility]   PHYSICIAN:  Evelene Croon, M.D.     DATE OF BIRTH:  03-25-1941   DATE OF ADMISSION:  12/29/2008  DATE OF DISCHARGE:  01/03/2009                               DISCHARGE SUMMARY   HISTORY:  The patient is a 70 year old black female with a history of  hypertension and hyperlipidemia who was admitted on December 05, 2008,  after presenting with a several-week history of shortness of breath  which did not improve with treatment for bronchitis with amoxicillin.  Additionally, she reported some intermittent chest pain as well as  subjective fevers, although she did not check her temperature.  She  noticed that her hands were very pale.  She presented to the emergency  room and was noted to be hypotensive with a systolic pressure in the 80s  and also notable for hemoglobin of 4.  She was transfused as well as  fluid resuscitated with improvement.  Her pressure did remain low.  Additionally, she was found to have some dark stools, but denied  previous melena or bright red blood per rectum.  She does state that she  has been taking frequent nonsteroidal anti-inflammatory agents for  headache.  She underwent upper endoscopy due to history of previous AVMs  with GI bleeding.  This endoscopy was negative.  She subsequently  underwent a capsule endoscopy which showed several nonbleeding AVMs, 1  in the distal duodenum and proximal jejunum and 4-5 in the mid to distal  small bowel.  Additionally, she was noted to have a slightly elevated  BNP and underwent a 2D echocardiogram which revealed severe aortic  stenosis with an aortic valve area of 0.78 cm sq.  The mean gradient was  43 mmHg with a peak gradient of 67 mmHg.  The aortic valve was markedly  calcified with poorly mobile leaflets.  Ejection fraction was 60%.  She  was then scheduled for cardiac catheterization.  This was done on December 12, 2008, by Dr. Anne Fu.  Findings were remarkable for moderate diffuse  coronary artery disease, most notable 40-50% left anterior descending  lesion at the bifurcation of the diagonal branch with a 60% ostial  diagonal lesion.  The circumflex artery revealed a 60% lesion tapering  at the atrioventricular groove circumflex artery.  The right coronary  artery had moderate diffuse disease, was a small caliber vessel.  Findings were also notable for normal right-sided heart pressures.  There was no evidence of pulmonary hypertension.  Due to these findings,  the patient was felt to require in addition to aortic valve replacement  coronary artery bypass grafting.  She was admitted this hospitalization  for the procedure.   PAST MEDICAL HISTORY:  Also notable for the following:  1. Hypertension.  2. Hyperlipidemia.  3. Chronic headaches.  4. Chronic back pain.  5. History of anxiety.  6. History of gastric AVMs with a history of GI bleeding.   PAST SURGICAL HISTORY:  Rectosigmoid  colectomy for chronic  diverticulitis.  She is also status post cholecystectomy, status post  hysterectomy. and left oophorectomy.   MEDICATIONS PRIOR TO ADMISSION:  1. Klor-Con 10 one tablet daily.  2. Lipitor 20 mg daily.  3. Lorazepam 0.5 mg 1/2-1 tablet at bedtime.  4. Omeprazole 40 mg twice daily.  5. Verapamil 240 mg daily.  6. Senna laxative 2 tablets at bedtime.  7. Lisinopril/HCTZ 20/25 mg daily.  8. Nu-Iron 150 mg twice daily.   FAMILY HISTORY, SOCIAL HISTORY, REVIEW OF SYMPTOMS, AND PHYSICAL EXAM:  Please see the history and physical done at the time of admission.   HOSPITAL COURSE:  The patient was admitted electively and on December 29, 2008, she was taken to the operating room where she underwent the  following procedure:  Coronary artery bypass grafting x2 using saphenous  vein grafts to the diagonal branch of the LAD as  well as to the obtuse  marginal branch of the left circumflex coronary artery.  Additionally,  she underwent aortic valve replacement with a 21-mm Edwards pericardial  valve.  She tolerated the procedure well and was taken to the Surgical  Intensive Care Unit in stable condition.   POSTOPERATIVE HOSPITAL COURSE:  She has done quite well.  She was  extubated uneventfully.  She remained neurologically intact.  She  remained hemodynamically stable.  She initially did have some AAI  pacing, but this was weaned without difficulty.  All routine inotropic  agents were discontinued without difficulty.  Postoperative EKG was  unremarkable.  Laboratory values did reveal acute blood loss anemia.  Her values were monitored closely.  They have stabilized.  Her most  recent hemoglobin and hematocrit dated January 02, 2009, are 8.9 and 27.0  respectively.  She has had some moderate volume overload, but is  responding well to diuresis.  She did have 1 episode of 10 beats of  supraventricular tachycardia, but is otherwise remained in a normal  sinus rhythm.  Her incisions are all healing well without evidence of  infection.  She did have a mild postoperative leukocytosis which has  resolved and there is no evidence of infection.  Most recent white blood  cell count dated January 02, 2009, is 11.3.  She has been weaned from  oxygen and maintains good saturations on room air.  She is afebrile.  She is tolerating routine activity using standard postcardiac surgical  protocols.  Tentatively, she is felt to be stable for discharge in the  morning of January 03, 2009, pending morning round reevaluation.   INSTRUCTIONS:  The patient will receive written instructions in regard  to medications, activity, diet, wound care, and followup.   FOLLOWUP:  Dr. Laneta Simmers 3 weeks post discharge.  Additionally, she is  instructed to follow up with Dr. Anne Fu in 2 weeks.   CONDITION ON DISCHARGE:  Stable and improving.   FINAL  DIAGNOSES:  1. Severe aortic stenosis, now status post aortic valve replacement as      described.  2. Coronary artery disease, status post revascularization again as      described above.   OTHER DIAGNOSES:  1. Postoperative acute blood loss anemia.  2. History of hypertension.  3. History of hyperlipidemia.  4. History of chronic headaches.  5. History of chronic back pain.  6. History of anxiety.  7. History of gastric arteriovenous malformations with history of      gastrointestinal bleeding.  8. History of diverticulitis, status post rectosigmoid colectomy in  the past.  9. Status post cholecystectomy.  10.Status post hysterectomy.  11.Status post left oophorectomy.   DISCHARGE MEDICATIONS AT THE TIME OF THIS DICTATION:  1. Klor-Con 10 mEq daily.  2. Lipitor 20 mg daily.  3. Lorazepam 0.5 mg 1/2-1 1 tablet nightly.  4. Omeprazole 40 mg twice daily.  5. Senna laxative p.r.n.  6. Nu-Iron 150 mg daily.  7. Aspirin 81 mg daily.  8. Lopressor 25 mg twice daily.  9. For pain, oxycodone 5 mg 1-2 every 4-6 hours as needed.      Rowe Clack, P.A.-C.      Evelene Croon, M.D.  Electronically Signed    WEG/MEDQ  D:  01/02/2009  T:  01/03/2009  Job:  [id number]   cc:   Jake Bathe, MD  Deatra James, M.D.

## 2011-02-05 NOTE — Cardiovascular Report (Signed)
NAMECHANETTE, DEMO              ACCOUNT NO.:  [id number]   MEDICAL RECORD NO.:  [id number]           PATIENT TYPE:   LOCATION:                                 FACILITY:   PHYSICIAN:  Jake Bathe, MD      DATE OF BIRTH:  April 18, 1941   DATE OF PROCEDURE:  12/12/2008  DATE OF DISCHARGE:                            CARDIAC CATHETERIZATION   INDICATIONS:  A 70 year old female with newly discovered severe aortic  stenosis by echocardiogram with history of GI bleed/gastric AVMs here  for preoperative cardiac catheterization.   PROCEDURE DETAILS:  Informed consent was obtained including risk of  stroke, heart attack, death, renal impairment, damage to  artery/vein/bleeding.  Visualization of the femoral head was obtained  via fluoroscopy.  Lidocaine 1% was used for local anesthesia.  Conscious  sedation was used with 1 mg of Versed, 25 mcg of fentanyl.  Airway was  assessed.  A single stick of the right femoral artery was performed, and  modified Seldinger technique was used to cannulate this with a 5-French  sheath.  Next, a 7-French sheath was placed into the right femoral vein.  Following this, a balloon-tipped Swan-Ganz catheter was then used to  tabulate pressures from right-sided heart structures.  A Fick cardiac  output was obtained.  A thermodilution cardiac output was also obtained.  Following the right heart catheterization, left heart catheterization  was performed.  Sheaths were adequately flushed.  A Judkins left #4  catheter was used to selectively cannulate the left main artery.  Multiple views with hand injection of Omnipaque were obtained.  This  catheter was then removed, and a Judkins right #4 catheter was used to  selectively cannulate the right coronary artery.  Multiple views with  hand injection of Omnipaque were obtained.  The aortic valve was not  crossed secondary to the conclusive echocardiogram demonstrating  hemodynamic significant for severe aortic stenosis.   Following the  procedure, sheaths were removed.  The patient was hemodynamically stable  and tolerated the procedure well.   FINDINGS:  1. Left left main artery - no angiographically significant coronary      artery disease.  2. Left anterior descending artery - there is proximal calcification,      diffuse.  This artery branches into 2 diagonal arteries.  At the      second diagonal branch, there is approximately 40% stenosis at the      bifurcation.  The first diagonal branch has approximately 50-60%      ostial stenosis.  3. Circumflex artery - there was one large obtuse marginal branch, and      at the bifurcation of the first obtuse marginal branches and the AV      groove circ, there was approximately up to 60% stenosis of the      origin of the AV groove circumflex takeoff.  4. Right coronary artery - this is a small-caliber vessel giving rise      to the posterior descending artery.  There was moderate diffuse      disease up to 50% throughout this vessel.  Right  heart      catheterization.   HEMODYNAMICS:  1. Right atrium - 9/7 with a mean of 7 mmHg.  2. Right ventricle 38/6 with an end-diastolic pressure of 10 mmHg.  3. Pulmonary artery 36/15 with a mean of 23 mmHg.  4. Pulmonary capillary wedge pressure - 14/11 with a mean of 9 mmHg.      Cardiac output by Fick method was 6.27 L per minute with a cardiac      index of 3.6.  By thermodilution method, it was 5.1 L per minute      with a cardiac index of 3.05.  Pulmonary artery saturation was 67%.      Femoral artery saturation was 92%.   IMPRESSION:  1. Moderate diffuse coronary artery disease, most notable 40-50% left      anterior descending at the bifurcation of the diagonal branch with      60% ostial diagonal disease also.  2. Circumflex artery - there is up to 60% tapering of the      atrioventricular groove circumflex artery as described above.  3. Right coronary artery with moderate diffuse disease -  small-caliber      vessel.  4. Normal right-sided heart pressures.  No evidence of pulmonary      hypertension.   PLAN:  We will discuss cardiac catheterization with Dr. Evelene Croon,  who has been consulted for bioprosthetic aortic valve.       Jake Bathe, MD  Electronically Signed     MCS/MEDQ  D:  12/12/2008  T:  12/13/2008  Job:  [id number]

## 2011-02-05 NOTE — Op Note (Signed)
NAME:  [name], [name]              ACCOUNT NO.:  [id number]   MEDICAL RECORD NO.:  [id number]          PATIENT TYPE:  AMB   LOCATION:  ENDO                         FACILITY:  [facility]   PHYSICIAN:  Graylin Shiver, M.D.   DATE OF BIRTH:  01/17/1941   DATE OF PROCEDURE:  08/04/2007  DATE OF DISCHARGE:                               OPERATIVE REPORT   ESOPHAGOGASTRODUODENOSCOPY WITH ARGON PLASMA COAGULATION OF  ANGIODYSPLASIA OF THE STOMACH:   INDICATION FOR PROCEDURE:  Recurring anemia.  Prior history of AVM of  the stomach, which was cauterized.   Informed consent was obtained after explanation of the risks of  bleeding, infection and perforation.   PREMEDICATION:  Fentanyl 50 mcg IV, Versed 4 mg IV.   PROCEDURE:  With the patient in the left lateral decubitus position, the  Pentax gastroscope was inserted into the oropharynx and passed into the  esophagus.  It was advanced down the esophagus, then into the stomach  and into the duodenum.  The second portion and bulb of the duodenum  looked normal.  The stomach revealed a small, bright red, approximately  3-mm AVM in the antrum.  This was cauterized with the argon plasma  coagulator.  The rest of the stomach looked normal.  The esophagus  looked normal.  She tolerated the procedure well without complications.   IMPRESSION:  Arteriovenous malformation of the stomach, cauterized with  the argon plasma coagulator.   PLAN:  The patient will be instructed to follow up with me in the office  in approximately 1 month to see how she is doing clinically.           ______________________________  Graylin Shiver, M.D.     SFG/MEDQ  D:  08/04/2007  T:  08/04/2007  Job:  [id number]   cc:   Deatra James, M.D.  Fax: [phone]

## 2011-02-05 NOTE — Op Note (Signed)
[name], [name]              ACCOUNT NO.:  [id number]   MEDICAL RECORD NO.:  [id number]          PATIENT TYPE:  INP   LOCATION:  1524                         FACILITY:  [facility]   PHYSICIAN:  Bernette Redbird, M.D.   DATE OF BIRTH:  [birth year]   DATE OF PROCEDURE:  04/09/2008  DATE OF DISCHARGE:                               OPERATIVE REPORT   PROCEDURE:  Upper endoscopy.   INDICATIONS:  A 70 year old female with recurrent anemia and history of  gastric AVM, status post APC treatment who also uses aspirin and NSAIDs,  and who has been feeling weak recently and has been having nonspecific  abdominal symptoms.  She presented to the hospital yesterday after being  found to be Hemoccult positive in her primary physician's office and her  hemoglobin is 7.4 and, by my rectal exam today, the stool was dark, semi  melenic, has a typical melenic odor, and is pasty in color consistent  with recent GI bleeding.   FINDINGS:  Normal exam.   PROCEDURE:  The patient provided written consent for the procedure,  having been brought from her hospital room to the endoscopy unit.  Topical pharyngeal anesthesia was followed by intravenous sedation with  fentanyl 50 mcg and Versed 6 mg IV without clinical instability or  desaturation.  The Pentax video endoscope was passed under direct  vision.  The vocal cords looked normal.  The esophagus was readily  entered and was normal in its entirety without evidence of reflux  esophagitis, Barrett's esophagus, varices, infection, neoplasia, Mallory-  Weiss tear, or any ring or stricture.  A minimal 1-cm hiatal hernia was  present.   The stomach was entered.  It contained a small amount of amber-colored  bile with no blood or coffee-ground material.   The gastric mucosa was entirely normal in all locations.  There was no  evidence of any AVMs in the stomach at this time, and somewhat  surprisingly, there was no evidence of any antral gastritis either,  despite her history of daily Goody powder use and occasional Naprosyn  use.  No erosions, ulcers, polyps, masses, or gastritis were observed.  The entire stomach was inspected very carefully and a retroflexed view  of the cardia was similarly unrevealing.   The pyloric channel was carefully inspected and was free of ulcerations.  The duodenal bulb was pristine, without any inflammatory change or  ulceration, and second duodenum looked normal.   The scope was then removed from the patient.  No biopsies were obtained.  She tolerated the procedure well and there were no apparent  complications.   IMPRESSION:  1. Heme-positive stool melena and anemia without any evident source on      current examination.  2. Minimal hiatal hernia, not felt to be clinically significant.  3. Specifically, no evidence of aspirin or NSAID gastropathy despite      her history of exposure to those medications, nor any evidence of      residual or recurrent arteriovenous malformations in the stomach,      status post treatment x2 in the past for that condition.  PLAN:  1. A CT scan of abdomen and pelvis today because of nonspecific      abdominal discomfort and unexplained blood loss, to help ensure the      absence of any small bowel tumors as the source of her problem.  2. Small bowel capsule endoscopy has been scheduled for Monday.  If      the patient is clinically stable and holds  her hemoglobin after      the current transfusion, discharge tomorrow may be appropriate, in      which case the small bowel capsule endoscopy the day after to be      done on an outpatient basis.           ______________________________  Bernette Redbird, M.D.     RB/MEDQ  D:  04/09/2008  T:  04/09/2008  Job:  [id number]   cc:   Deatra James, M.D.  Fax: [phone]   Graylin Shiver, M.D.  Fax: [phone]

## 2011-02-05 NOTE — Consult Note (Signed)
NAMESHAKIERA, EDELSON              ACCOUNT NO.:  [id number]   MEDICAL RECORD NO.:  [id number]          PATIENT TYPE:  INP   LOCATION:  5148                         FACILITY:  [facility]   PHYSICIAN:  Evelene Croon, M.D.     DATE OF BIRTH:  [birth year]   DATE OF CONSULTATION:  12/09/2008  DATE OF DISCHARGE:                                 CONSULTATION   REFERRING PHYSICIAN:  Jake Bathe, MD   REASON FOR CONSULTATION:  Severe aortic stenosis.   CLINICAL HISTORY:  I was asked by Dr. Anne Fu to evaluate Ms. Dado  for consideration of aortic valve replacement.  She is a 70 year old  black female with history of hypertension and hyperlipidemia who was  admitted on December 05, 2008 after presenting with a several-week history  of shortness of breath which did not improve with treatment for  bronchitis with amoxicillin.  She does report some intermittent chest  pain as well as subjective fevers, although she did not check her  temperature.  She did notice that her hands have been very pale.  When  she came to the emergency room, she was noted to be hypotensive with a  pressure in the 80s and was noted to have a hemoglobin of 4.  She was  transfused and fluid resuscitated with improvement.  Her pressure was  low as 60.  She have been having some dark stools, but denies any melena  or bright red blood per rectum.  She have been taking a lot of  nonsteroidal anti-inflammatory agents for headache.  She underwent upper  endoscopy due to history of previous gastric AVMs with GI bleeding.  The  endoscopy was negative.  She subsequently underwent capsule endoscopy,  which showed several nonbleeding AVMs; one in the distal duodenum and  proximal jejunum and 4 or 5 in the mid-to-distal small bowel.  She did  have a slightly elevated BNP and underwent a 2-D echocardiogram, which  showed severe aortic stenosis with an aortic valve area of 0.78 sq cm.  The mean gradient was 43 mmHg and peak gradient of 67  mmHg.  The aortic  valve was markedly calcified with poorly mobile leaflets.  Ejection  fraction was 60%.  She is now scheduled for cardiac catheterization on  Monday.   REVIEW OF SYSTEMS:  GENERAL:  She reports some subjective fevers over  the past few weeks, but has not checked her temperature.  She denies any  chills.  She has had fatigue.  EYES:  Negative.  ENT:  Negative.  ENDOCRINE:  She denies diabetes and hypothyroidism.  CARDIOVASCULAR:  She has had some intermittent substernal chest pressure over the past  few weeks.  She reports exertional dyspnea as well as some shortness of  breath at rest.  She has had orthopnea.  She denies peripheral edema.  She denies palpitations.  RESPIRATORY:  She denies cough and sputum  production.  She has had exertional dyspnea.  GI:  She denies nausea or  vomiting.  She denies melena and bright red blood per rectum, but has  had some dark stools.  GU:  She  denies dysuria and hematuria.  NEUROLOGIC:  She denies any focal weakness or numbness.  She denies  dizziness and syncope.  She has had headaches.  MUSCULOSKELETAL:  She  denies arthralgias and myalgias.  PSYCHIATRIC:  Negative.  HEMATOLOGIC:  She denies any history of bleeding disorders or easy bleeding.   ALLERGIES:  None.   PAST MEDICAL HISTORY:  1. Hypertension and hyperlipidemia.  2. She has a history of chronic headaches.  3. She does report history of chronic back pain.  4. She has a history of anxiety.  5. She has history of gastric AVMs with history of GI bleeding,      treated with endoscopy.   PAST SURGICAL HISTORY:  Chronic diverticulitis, status post rectosigmoid  colectomy in the past.  She is status post cholecystectomy and status  post hysterectomy and left oophorectomy in the past.   MEDICATIONS PRIOR TO ADMISSION:  1. Naprosyn 550 mg b.i.d.  2. Amoxicillin 875 mg b.i.d.  3. Ultram 50 mg p.r.n.  4. Potassium chloride 10 mEq daily.  5. Verapamil 240 mg daily.  6.  Dicyclomine 20 mg t.i.d.  7. Lorazepam 0.5 mg p.r.n.  8. Ventolin 90 mcg q.6 h.  9. Prilosec 40 mg b.i.d.  10.Lipitor 20 mg daily.  11.Lisinopril/hydrochlorothiazide 20/25 daily.   SOCIAL HISTORY:  She lives in [place] with her husband for 4 years.  She was a previous smoker and denies alcohol use.   FAMILY HISTORY:  Significant for end-stage renal disease and  hypertension.   PHYSICAL EXAMINATION:  VITAL SIGNS:  Her blood pressure is 143/78 and  pulse is 60 and regular.  Respiratory rate is 18 and unlabored.  GENERAL:  She is a mildly obese black female in no distress.  HEENT:  Normocephalic and atraumatic.  Pupils are equal and reactive to  light accommodation.  Extraocular muscles are intact.  Throat is clear.  NECK:  Normal carotid pulses bilaterally.  There is a transmitted murmur  at the both sides of her neck.  There is no adenopathy or thyromegaly.  CARDIAC:  Regular rate and rhythm with a grade 2/6 systolic murmur over  her aorta.  There is no diastolic murmur.  LUNGS:  Clear.  ABDOMEN:  Active bowel sounds.  Her abdomen is soft, mildly obese, and  nontender.  There are no palpable mass or organomegaly.  EXTREMITIES:  No peripheral edema.  Pedal pulses are palpable  bilaterally.  SKIN:  Warm and dry.  NEUROLOGIC:  Alert and oriented x3.  Motor and sensory exams are grossly  normal.   Laboratory examination today shows a hemoglobin of 9.9 with hematocrit  29.7, white blood cell count is 12.4.  She did receive some steroids.  Platelet count 161,000.  Electrolytes are normal with a BUN of 13,  creatinine 0.8.  Coagulation profile is within normal limits.  Liver  function profile was normal on admission with an albumin of 3.1.  Cardiac enzymes were negative.  BNP yesterday decreased to 233.   IMPRESSION:  Ms. Donovan has severe aortic stenosis by echocardiogram  presenting with symptoms of congestive heart failure and profound  anemia.  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  Regardless, I think she should have aortic valve  replacement.  She has some multiple symptoms attributable to her aortic  stenosis.  She is scheduled to undergo cardiac catheterization on  Monday, December 12, 2008 and we will reevaluate her after catheterization  to this final  timing of aortic valve replacement.  I would plan to use a  tissue valve given her age with history of GI bleeding.  I discussed the  echocardiogram findings with the patient and her grandson.  I discussed  aortic valve replacement, alternatives, benefits, and risks including  but not limited to  bleeding, blood transfusion, infection, stroke, myocardial infarction,  heart block requiring permanent pacemaker, and death.  She understands  all of this and agrees to proceed with catheterization and surgery if we  feel that is necessary.      Evelene Croon, M.D.  Electronically Signed     BB/MEDQ  D:  12/09/2008  T:  12/10/2008  Job:  [id number]   cc:   Jake Bathe, MD  Deatra James, M.D.

## 2011-02-05 NOTE — Consult Note (Signed)
NAMEMARIETA, Weeks NO.:  [id number]   MEDICAL RECORD NO.:  [id number]          PATIENT TYPE:  INP   LOCATION:  1524                         FACILITY:  [facility]   PHYSICIAN:  [name], M.D.   DATE OF BIRTH:  09-24-1940   DATE OF CONSULTATION:  04/09/2008  DATE OF DISCHARGE:                                 CONSULTATION   Dr. Crista Weeks of the [facility] asked [place] to see this 70-  year-old patient of Dr. Charise Carwin Weeks's and Dr. Beverlee Weeks because of  recurrent anemia and heme positive stool.   HISTORY OF PRESENT ILLNESS:  [name] has a past history of anemia  attributed to a gastric AVM.  She had a negative screening colonoscopy  in August of 2007, but then presented with anemia in May of 2008 at  which time a 6 mm AVM in the body of the stomach was cauterized with the  argon plasma coagulator by Dr. Dorena Weeks.  She had recurrent anemia 6  months later and underwent another endoscopy at which time a 3 mm AVM in  the antrum of the stomach was treated with the argon plasma coagulator  by Dr. Evette Weeks.   She was then treated with iron and did well.  Hemoglobin levels were  14.3 on November 24, 2007, and 12.6 on February 24, 2008.  The patient's iron was  stopped in March in view of the normal hemoglobin level.   With all that background, the patient began to have nonspecific stomach  pains somewhat like gas pains where it would hurt to take a deep breath,  perhaps with some loss of appetite, approximately 2-3 weeks ago.  There  is no obvious history of overt GI bleeding such as frequent melenic  stools.  She became somewhat weak and was seen yesterday at Dr. Chase Weeks  office, where she was noted to be Hemoccult positive and she was sent to  the emergency room at [facility] where her hemoglobin was low  at 8.1 and overnight it has dropped to 7.4 with IV hydration.   Of note, the patient uses BC powders on a daily basis because of aches  and pains  and also uses p.r.n. Anaprox.   PAST MEDICAL HISTORY:  No known allergies.  Operations, remote  hysterectomy with left ovary removal, also I believe status post  cholecystectomy and back surgery.  Medical illnesses include some  bronchiectasis, hypertension, GERD, hypercholesterolemia and the above  mentioned recurrent anemia.   OUTPATIENT MEDICATIONS:  Verapamil, lisinopril, omeprazole, lorazepam,  Lipitor, KCl and Anaprox.   HABITS:  Smokes one half-pack per day, nondrinker.   FAMILY HISTORY:  Negative for colon cancer, polyps or liver disease.   SOCIAL HISTORY:  The patient is on disability.   REVIEW OF SYSTEMS:  See HPI.  No significant weight loss, no obvious  dyspeptic symptoms.  She has been running a little bit constipated  recently.   PHYSICAL EXAM:  GENERAL:  This is a somewhat overweight African American  female in no evident distress.  HEENT:  She is without gross pallor or icterus.  CHEST:  Clear.  HEART:  Has a 2-3/6 systolic ejection murmur at the upper left sternal  border.  ABDOMEN:  The abdomen is without organomegaly or masses.  It is somewhat  touchy with some voluntary guarding but I do not think any true  tenderness.  She was Hemoccult positive yesterday for [name].   LABS:  Hemoglobin 7.4 as of this morning, 8.1 yesterday at the time of  admission with an MCV of 89 and a normal RDW of 14.7.  White count  slightly elevated at 11,300 with 75 polys, 18 lymph, 6 monocytes.  INR  normal at 1.1.  Chemistry panel normal including BUN of 12, creatinine  0.6.  Liver chemistries normal.  Albumin prior to hydration is slightly  low at 3.4.  Amylase is minimally elevated at 140, lipase normal at 30.  Iron level undetectable.   IMPRESSION:  1. Severe anemia with 5 gram drop in hemoglobin over the past 6 weeks      but without overt clinical gastrointestinal bleeding.  2. Recurrent iron deficiency.  3. History of gastric AVM (arteriovenous malformation) status  post      ablation x2.  4. Aspirin and NSAID exposure.   PLAN:  Endoscopic evaluation to try to sort out whether this is aspirin  and NSAID induced gastropathy or recurrence of the AVM or both.  A lower  tract source of bleeding is unlikely since she had a negative  colonoscopy within the past 2 years and does not have typical lower  tract symptoms for colitis, diverticular hemorrhage or vascular ectasia  bleeding from the lower tract such as burgundy stools.  It appears that  she is not having acute or subacute bleeding at this time in view of her  normal BUN.           ______________________________  [name], M.D.     RB/MEDQ  D:  04/09/2008  T:  04/09/2008  Job:  [id number]   cc:   [name], M.D.  Fax: [phone]   [name], M.D.  Fax: [phone]

## 2011-02-05 NOTE — Consult Note (Signed)
[name], [name]              ACCOUNT NO.:  [id number]   MEDICAL RECORD NO.:  [id number]          PATIENT TYPE:  INP   LOCATION:  1407                         FACILITY:  [facility]   PHYSICIAN:  John C. Madilyn Fireman, M.D.    DATE OF BIRTH:  1940-10-07   DATE OF CONSULTATION:  02/19/2007  DATE OF DISCHARGE:                                 CONSULTATION   REASON FOR CONSULTATION:  GI bleeding.   HISTORY OF PRESENT ILLNESS:  The patient is a 70 year old black female  who is referred for several week history of increasing intermittent  fatigue, dyspnea on exertion, dizziness.  She has also noticed black  stools for an undetermined amount of time.  She was seen at Dr. Chase Caller  office yesterday and had a hemoglobin of 6.3 with MCV of 70.  She was  slightly hypotensive but normal pulse.  She admits to taking Goody  powders, at least three a day for headaches and back pain.  She has been  diagnosed with hiatal hernia in the past.  She has had a sigmoid  colectomy for diverticulitis in 2005 and apparently has had a  colonoscopy since then by [facility] with apparently no  significant abnormality.  I do not have a report of this available.   PAST MEDICAL HISTORY:  Hypertension, lipidemia, anxiety, diverticulitis.   PAST SURGICAL HISTORY:  Sigmoid colectomy.   ALLERGIES:  None known.   MEDICATIONS:  Lipitor, Lisinopril, Lorazepam, Omeprazole, Verapamil.   PHYSICAL EXAMINATION:  GENERAL:  Well-developed, well-nourished black  female in no acute distress.  HEART:  Regular rate and rhythm without murmur.  LUNGS:  Clear.  ABDOMEN:  Soft, nondistended.  Normal active bowel sounds.  No  hepatomegaly, mass or guarding.  There is some epigastric tenderness.   LABORATORY DATA:  Hemoglobin 8.9 after two units of blood transfusion,  7.0 prior to transfusion.  BUN 50, creatinine 0.75.   IMPRESSION:  Significant microcytic anemia with black stools.  Suspect  upper GI with blood loss, peptic  ulcer disease, related to NSAIDs.   PLAN:  Will proceed with EGD later today.           ______________________________  Everardo All. Madilyn Fireman, M.D.     JCH/MEDQ  D:  02/19/2007  T:  02/19/2007  Job:  [id number]   cc:   Deatra James, M.D.  Fax: [phone]

## 2011-02-08 NOTE — Op Note (Signed)
NAME:  [name], [name]                        ACCOUNT NO.:  [id number]   MEDICAL RECORD NO.:  [id number]                   PATIENT TYPE:  INP   LOCATION:  0440                                 FACILITY:  [facility]   PHYSICIAN:  Lorre Munroe., M.D.            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   OPERATION/PROCEDURE:  Rectosigmoid colectomy with low pelvic anastomosis and  mobilization of splenic flexure.   SURGEON:  Lebron Conners, M.D.   ASSISTANT:  Leonie Man, M.D.   ANESTHESIA:  General.   DESCRIPTION OF PROCEDURE:  After the patient was monitored and anesthetized,  and had routine preparation and draping of the abdomen, and insertion of the  Foley catheter, I made a lower midline incision incising the previous wide  scar of her hysterectomy.  Eventually I had to extend the incision slightly  above the umbilicus to allow mobilization of the splenic flexure.  I entered  the abdomen carefully and explored.  There were adhesions in the right upper  quadrant.  No abnormalities were palpated or seen except for a very marked  thickening of the distal sigmoid colon with adherence of the colon to the  left pelvic sidewall and to the retroperitoneum.  I could palpate some  normal colon at the left ventricular of the peritoneal reflection distal to  the mass which I felt to be an inflammatory mass.  I mobilized the sigmoid  colon by excising the peritoneum lateral to it and I identified the ureter  and kept it in mind to avoid injury to it at all times during the operation.  I then mobilized the descending colon.  The sigmoid was quite foreshortened  and so I felt that it would be necessary to mobilize the splenic  flexure in  order to be able to do an anastomosis without tension.  By placing the  patient head-up and foot-down, and retracting downward on the colon and  small bowel and incised the peritoneum between the spleen and the colon and  bluntly dissected in that area, going a little ways out onto the transverse  colon in order to provide good mobility of the splenic flexure.  I packed  all the area and checked later and found hemostasis to be good.  I then had  plenty of colon to reach down into the pelvis after the resection.   I then divided the sigmoid colon at the mid sigmoid with the cutting stapler  and segmentally divided the mesentery between [facility] clamps and ligated the  vessels with 2-0 silks and the larger vessels were also suture ligated with  2-0 silk.  I then dissected down through the mesentery and came down  from  the inflammatory mass which was somewhat adherent to the left pelvic  sidewall.  I took care to dissect between the ureter and the mass, totally  mobilizing the mass and pulled it medially.  I then incised the peritoneum  at the pelvic floor and further mobilized the peritoneum on the right side,  again taking care to avoid ureteral injury.  I then further segmentally  divided the mesentery between clamps and divided it until I mobilized the  rectosigmoid enough to bring it up for anastomosis.  I put two stay sutures  in the lateral aspect of the rectum.  I then resected the bowel.  I got good  hemostasis of the area and then performed an end-to-end anastomosis to get a  single layer of 3-0 silk sutures, placed with the knots tied on the inside  posteriorly and on the outside anteriorly.  I checked the integrity and had  good vascular supply.  I found that the anastomosis was satisfactory.  I  then reduced the mesenteric defect with several sutures of 2-0 silk,  copiously irrigated the pelvis and removed the irrigant and found hemostasis  to be excellent.  I removed  all the packs from the abdomen and obtained the  correct sponge, needle and instrument counts.  I then closed the fascia with  running #1 PDS and closed the skin with staples after irrigating the  subcutaneous tissue.  The patient tolerated the operation well.                                               Lorre Munroe., M.D.    Jodi Marble  D:  10/31/2003  T:  10/31/2003  Job:  [id number]

## 2011-02-08 NOTE — Op Note (Signed)
[facility]  Patient:    [name], [name] Visit Number: [id number] MRN: [id number]          Service Type: SUR Location: [id number] Attending Physician:  Lubertha South Dictated by:   Kerrin Champagne, M.D. Proc. 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  OPERATION:  Left L4-5 microdiskectomy.  SURGEON:  Kerrin Champagne, M.D.  ASSISTANTJill Side P. Mahar, P.A.  ANESTHESIA:  GOT  ESTIMATED BLOOD LOSS:  10 cc  DRAINS: None.  BRIEF CLINICAL HISTORY:  The patient is a 70 year old female who presents with a several month history of back pain, radiation to left leg.  She has had left foot dorsiflexion weakness.  She had had previous microdiskectomy on the right side of the L2-3 level for a large disc protrusion almost a year ago and has done well up until three months ago when she had onset of back pain and radiation to the left leg.  She underwent selective nerve root block which only temporary relief of pain in the left leg.  She was brought to the operating room to undergo a left sided L4-5 microdiskectomy for lateral disc protrusion at this segment affecting the left L4 nerve root within the neuroforamen.  INTRAOPERATIVE FINDINGS:  Subligamentous disc protrusion extending to the left side affecting the left L4 nerve root within the left L4-5 neuroforamen.  DESCRIPTION OF PROCEDURE:  After adequate general anesthesia, the patient in knee chest position on the Andrews frame, standard preoperative antibiotics, standard prep with DuraPrep solution, draped in the usual manner with Iodine impregnated Vi-drape.  An incision approximately 3 inches in length  extending from at approximately the L4 level inferiorly through the skin and subcutaneous layers down to the lumbodorsal fascia.  Bleeders controlled using electrocautery.  Self retaining retractors inserted and the lumbodorsal fascia incised along the left side of the expected L4 and L5 spinous process. Intraoperative radiograph obtained with a Penfield 4 within the facet at the expected L4-5 level.  This was noted to be L3-4 on the x-ray.  The incision carried inferiorly an additional 1/2 inch and two Cobbs then used to elevated the paralumbar muscles off the posterior aspect of the elements of L4 and L5 exposing the intralaminar space posteriorly at the L4-5 level on the left side. A Penfield 4 then placed within the facet at the left L4-5 level. Intraoperative radiograph demonstrated this to be correct L4-5 level.  A Leksell rongeur was then used to debride soft tissue overlying the posterior aspect of the intralaminar space at L4-5 and also remove a small portion of the inferior aspect of the lamina on the left side at L4.  McCullough retractor inserted.  Leksell rongeur used to carry this within the posterior aspect of the lamina along the left side at the L4-5 level.  A 3 mm Kerrison then used to perform additional semihemilaminectomy on the left side at L4 removing the lamina up to the level of the insertion of the ligamentum flavum. Then 3 mm Kerrison then used to carefully free the attachment of the ligamentum flavum off the medial aspect of  the facet.  A high speed bur was then used to perform partial medial facetectomy of approximately 20 to 25% of the facet to allow for exposure of the lateral recess and the undersurface of the facet on the left side over the neuroforamen.  A 3 mm Kerrison was then used to additional debride the ligamentum flavum on the left side, removing it off the medial aspect of the facet performing remaining facetectomy about 20 to 25% and also perform  foraminotomy over the left L5 nerve root.  A small portion of the superior aspect of the lamina of L5 was resected over about 3 or 4 mm.  The medial border of the pedicle was identified.  The reflected portion of the ligamentum flavum on the medial aspect of the facet resected to provide further exposure of the neuroforamen.  The foramen was found to be quite tight.  Hockey stick neuroprobe could not be passed out the neuroforamen.  The foramen for the 5 root was found to be quite open.  The lateral aspect of the thecal sac and the L5 nerve root retracted medially. Bipolar electrocautery used to cauterize small epidural veins and these were then divided.  Exposing the posterior aspect of the disc on the left side at L4-5.  A longitudinal incision was then made into the disc using a #15 blade scalpel carefully protecting the L4 and L5 nerve roots.  A nerve hook was then used to free subligamentous disc protrusion extending into the neuroforamen. A Hockey stick neuroprobe used to provide compression of the posterior aspect of the disc causing subligamentous disc herniation to exude through the opening in the posterior lateral aspect of the disc.  This disc material was then removed using pituitary rongeurs straight and upbiting.  Disc was entered.  Also a small portion of the disc excised on the left side using pituitary rongeurs.  At least five or six small fragments of disc material were removed that were in subligamentous fashion causing compression of the left L4 neuroforamen and the L4 nerve root.  Following their decompression a Hockey stick neuroprobe could easily be passed out anterior to the nerve root as well as posterior to it and getting full decompression of the neuroforamen on the left side at 4-5.  A hockey stick neuroprobe was then passed over the posterior aspect of the disc space demonstrating no disc material remaining centrally and the L5 neuroforamen remained patent.   Irrigation was performed. Bone wax applied to the leading cancellus bone surfaces.  Note that the operating room microscope was used for the procedure including removal of the  ligamentum flavum, partial facetectomy and certainly during all portions of the procedure in which the nerve roots were identified and retracted. Thrombin soaked Gelfoam was then used to obtain hemostasis within the spinal canal.  Gelfoam was then removed.  No bleeders were identified.  Soft tissues were allowed to fall back into place.  Lumbodorsal fascia approximated in the midline with interrupted 0 Vicryl sutures.  Deep subcutaneous layers with interrupted 0 Vicryl sutures.  Superficial layers with interrupted 2-0 Vicryl sutures.  The skin closed with a running subcutaneous stitch of 4-0 Vicryl. Tincture of Benzoin and Steri-Strips applied.  The patient was then returned to a supine position, reactivated, extubated and returned to recovery room in satisfactory condition. Dictated by:   Kerrin Champagne, M.D. Attending Physician:  Lubertha South DD:  02/16/02 TD:  02/17/02 Job: [id number]

## 2011-02-08 NOTE — H&P (Signed)
[name]  Patient:    [name], [name]                       MRN: Adm. Date:  12/05/99 Attending:  Philips J. Montez Morita, M.D. Dictator:   Grayland Jack, P.A.                         History and Physical  DATE OF BIRTH:  March 04, 1940  CHIEF COMPLAINT:  Left lower extremity pain and dysfunction, left ankle pain.  HISTORY OF PRESENT ILLNESS:  [name] is a 70 year old female with approximately a 1-1/2-year history of left ankle and foot pain.  She had surgery on the left calcaneus approximately one year ago, and has been having problems since then.  X-rays of the left heel reveal a pump bump on the calcaneus and a portion of the Achilles insertion has been removed.  An MRI reveals a high-grade partial tear of the Achilles tendon.  It was felt that this patient would best benefit from  surgical intervention.  The risks, benefits, and complications of surgery have een discussed with the patient in detail, and she agrees to proceed.  ALLERGIES:  No known drug allergies.  CURRENT MEDICATIONS: 1. Celebrex 200 mg p.o. q.d. 2. K-Dur 20 mEq one tablet three times a week. 3. Triamterene/hydrochlorothiazide 37.5/25 mg p.o. three times a week. 4. Verapamil 180 mg 1/2 tablet p.o. q.d. 5. Premarin 0.625 mg p.o. q.d., days 1 through 25.  PAST MEDICAL HISTORY:  Significant for hypertension.  PAST SURGICAL HISTORY: 1. Cholecystectomy. 2. Hysterectomy. 3. Left foot surgery.  SOCIAL HISTORY:  [name] is currently working at [employer].  She states that she lives alone and she has two daughters.  She denies tobacco and alcohol use.   FAMILY HISTORY:  Significant for hypertension.  REVIEW OF SYSTEMS:  General:  The patient denies fever, chills, weight changes, or malaise.  HEENT:  The patient denies headaches, visual changes, ear pain or pressure, nasal congestion or discharge, sore throat or dysphagia. Respiratory: The patient denies a  productive cough, hemoptysis, or shortness of breath. Cardiac:  The patient denies chest pain, palpitations, angina, or dyspnea on exertion.  GI:  The patient denies abdominal pain, nausea, vomiting, constipation, or diarrhea.  GU:  The patient denies dysuria, hematuria, increase in frequency or hesitancy.  Musculoskeletal:  See the HPI.  PHYSICAL EXAMINATION:  VITAL SIGNS:  Temperature afebrile, pulse 76, respirations 18, blood pressure 160/90.  GENERAL:  This is a well-developed, well-nourished 70 year old female, in no acute distress.  HEENT:  Normocephalic, atraumatic.  Pupils equal, round, reactive to light.  NECK:  Supple, no jugular venous distention, no lymphadenopathy, no carotid bruits appreciated.  CHEST:  Clear to auscultation.  No rales, no rhonchi, no wheezing appreciated.   HEART:  A regular rate and rhythm.  No rubs, gallops, or murmurs.  ABDOMEN:  Positive bowel sounds, soft, nontender.  No organomegaly.  BREASTS/GU/RECTAL:  Not done, not pertinent to the present illness.  EXTREMITIES:  Left lower extremity: Left foot and ankle has atrophied calf. The patient is tender to palpation over the Achilles tendon.  The patient is unable to plantar flex the foot.  Sensation is intact.  Pulses are intact.  IMPRESSION: 1. Left Achilles tendon tear. 2. Hypertension.  PLAN:  This patient will be admitted to [facility] on December 05, 1999, to undergo a left Achilles tendon repair with  possible graft.  The postoperative course has been discussed with the patient in detail.  All questions have been encouraged and answered. DD:  12/04/99 TD:  12/04/99 Job: [id number]

## 2011-02-08 NOTE — Op Note (Signed)
[place]  Patient:    ARNITRA, SOKOLOSKI                       MRN: [id number] Proc. Date: 12/05/98 Adm. Date:  [id number] Attending:  Montez Morita, Philips John                           Operative Report  PREOPERATIVE DIAGNOSIS:  ____________ achilles tendon undetermined age, left leg.  POSTOPERATIVE DIAGNOSIS:  ____________ achilles tendon undetermined age, left leg.  OPERATION PERFORMED:  Repair.  SURGEON:  Philips J. Montez Morita, M.D.  ASSISTANT:  Ralene Bathe, P.A.  ANESTHESIA:  General.  DESCRIPTION OF PROCEDURE:  After suitable general anesthesia, she was turned prone and prepped and draped routinely.  A previously applied upper thigh tourniquet as inflated after elevation of the leg to 350 mmHg.  A tourniquet was up about 50 minutes.  Incision was made along the medial calcaneus through a previous incision and then transversely just above the achilles tendon and then laterally proximally up the leg.  The tendon sheath was opened in a similar fashion but in a reverse manner so that the medial limb would be the opposite site.  14/16ths of the tendon had been pulled loose from the previous surgery area and there was about a 2/16 or less han 1/8 of the tendon somewhat stretched and attenuated along the medial border and  this was freed up and released.  The tendon was then released with finger pressure until it could be pulled down.  A bit of a pump bump remaining was excised and there was a nice fresh bony area where we could bring the tendon into and it seemed to be able to pull down satisfactorily in that area.  A #5 nonabsorbable suture was then passed in a Bunnell type through the tendon proximally with multiple passages and then coming out in the tendon, was brought down into the repair bed and two drill holes were prepared about an inch distally, connected with a towel clip.  Sutures brought through.  We pulled down and began  to tie and realized that it was going to cut through one side as the bone was too soft, so the drill holes were then passed out the bottom of the heel and padded  area using some Adaptic and 4 x 4s with a button placed over it so that the tendon was then pulled through this button beneath the heel with the skin protected with a pad.  Tourniquet was let down before this was tied and hemostasis looked quite good.  The tendon was then pulled down in position.  There was a small piece of the medial tendon remaining which was oversewn and then the paratenon was closed. t was necessary to extend the incision a bit proximally and take a free piece of paratenon off the midlateral side to use as a free graft because of the damage o the paratenon distally where we did not want it to stick down.  Skin closure with interrupted nylon and staples, a combination of a few subcutaneous stitches as well.  She was placed into a short leg fiberglass cast and then turned onto her  back and was extended to a long leg to keep the knee in flexion.  She went to recovery in good condition. DD:  12/05/99 TD:  12/05/99 Job: [id number]

## 2011-02-08 NOTE — Op Note (Signed)
[facility]  Patient:    [name], [name] [facility]                   MRN: [id number] Proc. Date: 09/18/00 Adm. 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  PROCEDURE:  Right L1-2 partial hemilaminectomy with microdiskectomy, right L2 hemilaminectomy with lateral recessed decompression at the L2-3 level, excisional biopsy of mole left posterior mid thorax level.  SURGEON:  Dr. Vira Browns.  ASSISTANT:  Dr. Marlowe Kays.  ANESTHESIA:  GOT, Dr. Aurther Loft Sidis.  ESTIMATED BLOOD LOSS:  150 cc.  DRAINS:  None.  BRIEF CLINICAL NOTE:  The patient is a 70 year old female whose been experiencing back pain and discomfort. It has been ongoing and worsening over the last 7 months. The patient seen and evaluated by Dr. Montez Morita and found to have a disk protrusion at the L1-2 level. She has undergone an MR study which demonstrated a large disk protrusion central left side of L1-2 and lateral recessed stenosis at L1-2 and L2-3 on the right side, anterior listhesis that is degenerative in nature at the L3-4 level, degenerative disk changes at nearly every segment with attended disk bulging at nearly every segment, moderate stenosis at T12-L1. After attempts at conservative management including epidural steroid injection, physical therapy and injection of her hip for differential, the patient has had no significant relief of the pain and is brought to the operating room to undergo a lateral recess decompression on the right side at L1-2 and L2-3 with exploration of the disk on the right side as the patients myelogram was not confirmatory of the disk herniation. INTRAOPERATIVE FINDINGS:  The patient was found to have a moderately large disk protrusion right in the central at L1-2  affecting the right side of the thecal sac and the right L2 nerve root. Lateral recessed stenosis also causing further compression of the right L2 nerve root at the L1-2 level and at L2-3 also lateral recessed stenosis, foraminal entrapment involving the L3 nerve root.  DESCRIPTION OF PROCEDURE:  After adequate general anesthesia, the patient in the knee chest position, Andrews frame, standard preoperative antibiotics, standard prep with duraprep solution including the upper area of the thorax where the patient had a mole that she desired to have excised. Draped in the usual manner, iodine impregnated Vi-drape over the chest including the incision area for the lumbar spine. Counting the spinous processes upward, 2 spinal needles were inserted at the expected L1 and L2 levels. Intraoperative radiograph lateral view demonstrated the needle was at the lower end of the L1 spinous process and at the lower end of the L2 spinous process. Her incision was made, directed upwards to the superior aspect of the spinous process of L1 downwards to the lower portion of L2.  Through the skin and subcutaneous layers in the midline, the incision approximately 7-8 cm in length after infiltration with Marcaine 0.5% with 1:200,000 epinephrine 9 cc. The incision carried sharply through skin and subcu layers down to the lumbodorsal fascia. Bleeders controlled using electrocautery. Self retaining retractors inserted. Electrocautery then used to divide the lumbodorsal fascia insertion in the spinous process on the right side of expected L2 and L1 level and a  clamp placed over the posterior aspect of the spinous process of L1. Intraoperative radiograph demonstrating that this was the intended level at L1. Two Cobbs were then used to elevate the paralumbar muscles off the posterior aspect of the elements of the posterior lamina of the lateral aspect of the spinous process of L1 and L2 and over the interlaminar space  posteriorly  at the L1-2 and L2-3 levels.  With this then, self retaining retractors were inserted, bleeders controlled using electrocautery. Leksell rongeur then used to carefully thin the lamina on the right side at the L2 level and over the inferior aspect of L1.  After thinning, a high speed bur was used to carefully demarcate a partial medial facetectomy at both L2-3 and over the inferior aspect of L1-2. The insertion of ligamentum flavum at the L1-2 level was identified at its superior extent over the anterior aspect of the lamina. The hemilaminectomy discontinued where the insertion of ligamentum flavum was identified and determined. The inferior articular process of L1 and then L2 were then carefully osteotomized using the Leksell rongeur as well as Kerrisons medially removing approximately 30% of the medial joint. The ligamentum flavum was carefully then freed up off of the insertion of the medial aspect of the facet at L1-2 and L2-3.  The operating room microscope was then draped and brought into the field. Under the operating room then the lateral recess with decompression at L1-2 and at L2-3 removing approximately 30% of the medial facet at L1-2 and L2-3. Then debriding hypertrophically ligamentum flavum at each segment that was impinging upon the lateral aspect of the thecal sac at L1-2 and L2-3. The entire ligamentum flavum was excised at L1-2 and at L2-3. Foraminotomy performed over the right L3 nerve root and then hypertrophic ligamentum flavum overlying the L2 nerve root was excised decompressing the neuroforamen for the L2 nerve root. When adequate decompression was obtained within the lateral recesses of L2-3 and L1-2, a DErrico retractor was then used to retract the thecal sac at the expected L2-3 disk space. Examination of disk space posteriorly demonstrated no evidence of disk protrusion only hard disk material and spur present over the posterior aspect of the  space. At the L1-2 level with retraction of the thecal sac, it was noted that the patient did have significant disk herniation centrally and to the right side such that a  nerve hook was able to be introduced and the fibers in the longitudinal ligament probed with probing is opened and disk material extruded from the subligamentous area. The nerve hook was then used to carefully tease and free up and further disk material and this was fragmented piecemeal but was excised in total. Additional 2 fragments extending superiorly underneath the ligament beneath the thecal sac on the right side in the right L1 nerve root. When this was completely decompressed, then a hockey stick neural probe could be passed out the L1 neuroforamen without difficulty and over the L2 neuroforamen. At the L1-2 level, no further impression of the thecal sac was noted on the right side anterior to the thecal sac and beneath the right L1 and L2 nerve roots. With this, it was felt that adequate decompression and diskectomy had been performed. Note that the pituitary could not be inserted into the disk space at L1-2 due to degenerative narrowing. Care was taken during the retraction phase and this shows an area near the ______ and conus area. Not to retract the conus excessively. Small epidural veins were then cauterized  using bipolar electrocautery. Hemostasis obtained using thrombin soaked Gelfoam and cottonoids. These were then removed. Bone wax applied to the bleeding cancellous bone surfaces and then excess bone wax removed. The operating scope was then removed. Soft tissue was allowed to fall back into place. Careful inspection demonstrated no active bleeding present. Note that all Gelfoam was removed following hemostasis. There was on active bleeding present following the removal of the retractors. The lumbodorsal fascia was then approximated in the midline with interrupted #1 Vicryl sutures, deep subcu layers  with interrupted #0 Vicryl sutures, more superficial layers with interrupted 2-0 Vicryl sutures and the skin was closed with a running subcuticular stitch of 4-0 Vicryl. Steri-Strips were then applied. The mole of her left upper chest in about the T7 or 8 level on the left side in the mid clavicular line was examined. This was a pedunculated mole that had the appearance of the normal type mole or pigmented nevus. An incision was made ellipsing the base of this particular skin tag. It was taken down into the subcu layers and then removed. The mole measured approximately 6-7 mm in height, a circumference of approximately 4 mm in diameter was pedunculated with a base about 3 mm in diameter. Following the excision of this mole, a small bleeder was controlled using bipolar electrocautery at its base. The subcu layers were then approximated with interrupted 3-0 Vicryl sutures and the skin approximated with interrupted vertical mattress sutures of 4-0 nylon. 2 x 2 affixed to the skin with hypofix tape. The incision over the lower back draped with 4 x 4s and affixed to the skin with hypofix tape. All instrument and sponge counts were correct. The patient was then returned to the supine position and extubated following reactivation and returned to the recovery room in satisfactory condition. Note, the operative specimen for gross pathology was disk material right L1-2. DD:  09/18/00 TD:  09/18/00 Job: [id number]

## 2011-02-08 NOTE — Discharge Summary (Signed)
[facility]  Patient:    [name], [name] Visit Number: [id number] MRN: [id number]          Service Type: SUR Location: 4W [id number] Attending Physician:  Lubertha South Dictated by:   Alexzandrew L. Perkins, P.A.-C. Admit Date:  02/16/2002 Discharge Date: 02/18/2002                             Discharge Summary  ADMISSION DIAGNOSES: 1. 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 2. Hypertension. 3. Hiatal hernia. 4. Diverticulosis.  DISCHARGE DIAGNOSES: 1. Herniated nucleus pulposus at left L4-5 with foraminal component affecting    left L4 nerve root, status post left L4-5 microdiskectomy. 2. Hypertension. 3. Hiatal hernia. 4. Diverticulosis.  PROCEDURE:  The patient was taken to the OR on 02/16/02, underwent a left L4-5 microdiskectomy.  Surgeon was Dr. [name], assistant was [facility], P.[facility].-C. Surgery was done under general anesthesia.  CONSULTATIONS:  None.  HISTORY OF PRESENT ILLNESS:  The patient is a 70 year old female who has been seen in consultation by Dr. Vira Browns.  Has been evaluated with left L4 radiculopathy, also weakness in left foot and left knee.  Previously had undergone selective nerve root blocks with only minimal and short-term relief. She has undergone workup which revealed a herniated nucleus pulposus at L4-5. It was felt she would benefit from undergoing surgery.  The patient is subsequently admitted to the hospital.  LABORATORY DATA:  CBC on admission:  Hemoglobin of 15.0, hematocrit of 43.9, white blood cell count 7.0, red blood cell count 4.68.  Differential all within normal limits.  Postoperative hemoglobin and hematocrit 13.7 and 38.9. PT and PTT on admission were 13.9 and 28, respectively, with an INR of 1.0. Chem panel on admission all within normal limits.  Followup BMET showed glucose was up from 94 to 112, BUN down from 10 to 4.  Urinalysis on admission was negative.  EKG dated 02/09/02, showed  normal sinus rhythm, nonspecific ST abnormality, minor, no significant change since last tracing of 12/03/99.  Confirmed by Dr. Othelia Pulling.  Chest x-ray dated 02/09/02, showed no active disease.  Intraoperative spine films taken on 02/16/02, showed localization of the spinous process at L4, and localization of metal device at the L5 process directed towards the L4-5 disk for localization of the L4-5 disk space.  HOSPITAL COURSE:  The patient was admitted to [facility], taken to the OR, underwent the above stated procedure without complication.  The patient tolerated the procedure well.  Was later transferred to the recovery room and then to the orthopedic floor for continued postoperative care.  Vital signs were followed.  The patient was placed on p.o. and IV analgesia for pain control following surgery.  Given antibiotics intraoperatively.  The patient was started back on her home medications.  Hemoglobin and hematocrit and BMET were followed and on postoperative day #1 were within normal limits.  Physical therapy and occupational therapy were consulted for gait training, ambulation, and activities of daily living.  The patient was doing a little bit better. On postoperative day #1, had some complaints of bloating, incision was healing well, the O2 what was left overnight was discontinued.  She was up ambulating with physical therapy, and by the following day, 02/18/02, on postoperative day #2, the patient is doing quite well.  Pain was under control, taking p.o. analgesics, had been up ambulating, and it was decided she could be discharged home.  DISCHARGE PLAN:  The patient is discharged home on 02/18/02.  DISCHARGE DIAGNOSES:  Same as above.  DISCHARGE MEDICATIONS: 1. Percocet 5/325 #40 one or two q.4-6h. p.r.n. pain. 2. Robaxin 500 mg #60 one p.o. q.6-8h. p.r.n. spasm.  DIET:  Resume previous home diet.  Recommend low sodium diet.  ACTIVITY:  Back precautions, up ad  lib.  FOLLOWUP:  Two weeks from surgery.  DISPOSITION:  Home.  CONDITION ON DISCHARGE:  Improved. Dictated by:   Alexzandrew L. Perkins, P.A.-C. Attending Physician:  Lubertha South DD:  04/04/02 TD:  04/06/02 Job: [id number]

## 2011-02-08 NOTE — Op Note (Signed)
NAME:  [name], [name]                        ACCOUNT NO.:  [id number]   MEDICAL RECORD NO.:  [id number]                   PATIENT TYPE:  AMB   LOCATION:  DSC                                  FACILITY:  [facility]   PHYSICIAN:  Leonides Grills, MD                    DATE OF BIRTH:  [birth year]   DATE OF PROCEDURE:  07/13/2002  DATE OF DISCHARGE:                                 OPERATIVE REPORT   PREOPERATIVE DIAGNOSES:  1. Left Haglund deformity.  2. Left tight gastroc.   POSTOPERATIVE DIAGNOSES:  1. Left Haglund deformity.  2. Left tight gastroc.   OPERATION PERFORMED:  1. Left Haglund deformity excision.  2. Left gastroc slide.  3. Left primary repair of Achilles tendon.   SURGEON:  Leonides Grills, MD   ASSISTANT:  Lianne Cure, PA   ESTIMATED BLOOD LOSS:  Minimal.   ANESTHESIA:  General endotracheal.   TOURNIQUET TIME:  37 minutes.   COMPLICATIONS:  None.   DISPOSITION:  Stable to PACU.   INDICATIONS FOR PROCEDURE:  The patient is a 70 year old female who  underwent a primary Achilles tendon repair previously as well as a Haglund  deformity excision previously.  She has had persistent posterior heel pain  directly over a prominent calcaneal tuberosity. X-rays confirmed that she  had a prominent Haglund deformity.  She was consented for the above  procedure.  All risks which include infection, neurovascular injury,  persistent pain, worsening of pain, possible Achilles rupture and possible  future surgery were all explained, questions were encouraged and answered.   DESCRIPTION OF PROCEDURE:  The patient was brought to the operating room and  placed initially in a supine position.  After adequate general endotracheal  tube anesthesia was administered as well as Ancef 1 gm IV piggyback.  The  patient was then placed in a prone position.  All bony prominences were well  padded after a proximally placed left thigh tourniquet was placed.  We then  prepped and draped the  left lower extremity in sterile manner.  The  procedure commenced with a longitudinal incision over the medial head of the  gastrocnemius over the muscle tendinous junction.  Dissection was carried  down to fascia.  Hemostasis was obtained.  Fascia was opened in line with  the incision.  The conjoined region was then identified between the gastroc-  soleus and soft tissues were then elevated off the posterior aspect of the  gastrocnemius.  Sural nerve was identified and protected posteriorly.  The  gastrocnemius tendon was then cut once the soft tissues were dissected  anteriorly and posteriorly with the Mayo scissors.  This had excellent  release of tight gastric.  The wound was copiously irrigated with normal  saline.  The subcutaneous tissues were closed with 4-0 Monocryl.  Steri-  Strips were applied.  We then went through the previous incision on the  medial and  lateral aspect of the Achilles tendon.  Dissection was carried  down to the calcaneal tuber.  Ethibond suture was then removed and the  Achilles tendon was elevated off the posterior aspect of the calcaneal tuber  where it was repaired previously.  It appeared once this was elevated, that  there was still some fibers of the Achilles tendon remained posterior to the  calcaneal tuber plantarly; however, due to the fact this was repaired  previously, we elected to repair this again primarily at the end of this  procedure.  With a sagittal saw, the calcaneal tuber was cut with the aid of  a C-arm view.  Once this was cut, the edges were then rongeured so that  there was no prominence both medially and laterally.  This was then  copiously irrigated with normal saline.  Again C-arm view confirmed that we  had adequate resection of the Haglund deformity.  The Achilles tendon was  then repaired primarily back to the calcaneus with an absorbable Arthrotec  5.0 suture anchor with #2 fiber wire.  This was repaired down beautifully  and  the tourniquet was deflated.  Hemostasis was obtained.  The wound was  closed with 4-0 nylon suture.  Sterile dressing was applied.  Modified  gravity equinus heel stretch was applied.  The patient was stable to the PR.                                                 Leonides Grills, MD    PB/MEDQ  D:  07/13/2002  T:  07/13/2002  Job:  [id number]

## 2011-02-08 NOTE — H&P (Signed)
[facility]  Patient:    [name], [name]                     MRN: [id number] Adm. Date:  [id number] Attending:  Edmund Hilda                         History and Physical  CHIEF COMPLAINT: Abdominal pain.  HISTORY OF PRESENT ILLNESS: The patient is a 71 year old black female, patient f Dr. Ova Freshwater, who presents with a five day history of lower abdominal pain. This has progressed over the past five days, culminating in a visit to the ER this morning because of increasing pain and inability to take any food or liquids in. She had called earlier in the week to the office and got something for her symptoms (do not know exactly what it was), but this did not seem to help.  She has had o previous problem with her bowels or abdomen.  She denies any fever or chills. he symptoms were increased with eating and drinking and therefore she has not been  taking as much as she should.  Initially she had some loose stools but she said she then got somewhat constipated because she was not taking anything in.  There is no history of bleeding per rectum.  When asked about colon cancer screening she was unclear if she really had anything done in the past or not.  She was seen by the ER M.D., who after examination and getting a CT confirmed that she had acute diverticulitis but no abscess.  She had required up to 8 mg of morphine for her  discomfort but still with pain, and it was felt that admission was warranted both for pain management and IV antibiotics, and the patient is being admitted for same. Of note is that she was recently in the hospital on December 04, 1999, admitted by  Dr. Michael Litter. Montez Morita for repair of a left Achilles tendon tear, and has a cast on from that.  PAST MEDICAL HISTORY:  1. Hypertension.  2. Hysterectomy.  3. Cholecystectomy.  4. Previous left foot surgery.  FAMILY HISTORY: Positive for hypertension on both  sides.  REVIEW OF SYSTEMS: Unremarkable except as related to above for her GI system and her ankle.  MEDICATIONS:  1. Verapamil 180 mg 1/2 tablet q.d.  2. Premarin 0.625 mg q.d.  3. Maxzide 25 mg Monday, Wednesday, and Friday.  4. K-Dur 20 mEq Monday, Wednesday, and Friday.  PHYSICAL EXAMINATION:  VITAL SIGNS: Blood pressure 130/80, pulse 78 and regular, respirations 16, temperature 99.8 degrees.  GENERAL: No acute distress but uncomfortable with her abdominal pain.  HEENT: TMs clear.  PERRL.  EOMI.  Discs flat.  Arteriolar narrowing.  Oral cavity shows dry mucous membranes.  NECK: Supple.  No adenopathy, JVD, bruits, or enlarged thyroid.  CHEST: Clear, nontender.  CARDIAC: Regular rate and rhythm without murmur, gallop, click, or rub.  ABDOMEN: Normal bowel sounds, soft.  Tender across the lower abdomen, more so on the left than on the right.  Questionable rebound on the left but no mass noted.  RECTAL: Some tenderness on examination but stool negative for blood.  EXTREMITIES: Has a cast on the left lower extremity.  NEUROLOGIC: No obvious deficit.  LABORATORY DATA: CT confirmed diverticulitis, sigmoid area, without abscess.  ASSESSMENT:  1. Acute diverticulitis.  2. High blood pressure.  3. Status post left Achilles tear repair.  PLAN: IV fluids, antibiotics, and pain management.  DD:  12/28/99 TD:  12/28/99 Job: [id number]

## 2011-02-08 NOTE — Discharge Summary (Signed)
NAME:  [name], [name]                        ACCOUNT NO.:  [id number]   MEDICAL RECORD NO.:  [id number]                   PATIENT TYPE:  OUT   LOCATION:  ED                                   FACILITY:  [facility]   PHYSICIAN:  Lorre Munroe., M.D.            DATE OF BIRTH:  1941/04/30   DATE OF ADMISSION:  11/06/2003  DATE OF DISCHARGE:  11/06/2003                                 DISCHARGE SUMMARY   HISTORY:  This patient is a 70 year old black female who had two episodes of  rather severe diverticulitis requiring admission.  This was well documented  by clinical evidence and by CT scan.  The patient continued not to feel too  well despite being on oral antibiotics at home after her latest episode.  She was advised to undergo elective sigmoid colectomy and agreed to have  that done.  She took a _________bowel prep with antibiotics at home without  difficulty and was admitted to the hospital for surgery.  She enjoys  generally good health.  She is a smoker, and she has hypertension which is  well controlled.   HOSPITAL COURSE:  On the day of admission she underwent sigmoid colectomy  with low pelvic anastomosis.  It was necessary to mobilize the splenic  flexure in order to perform the surgery.  Postoperatively, she generally did  well and developed no sign of intra-abdominal infection.  Her bowel function  returned gradually.  On November 06, 2003, she was afebrile, vital signs  were normal, and she had tolerated a solid diet well and had a small bowel  movement.  The wound was healing well and abdomen was soft.  Staples were  removed and Steri-Strips applied to the incision.  She was discharged, asked  to return to the office in about three weeks.  Her pathology report  confirmed the clinical impression of diverticulitis with perforation and  pericolonic abscess formation.  No evidence of malignancy was present.   DIAGNOSES:  1. Diverticulitis of the sigmoid colon, acute and  chronic.  2. Hypertension, treated.   OPERATION:  Sigmoid colectomy with anastomosis.   CONDITION ON DISCHARGE:  Improved.                                               Lorre Munroe., M.D.    Jodi Marble  D:  11/21/2003  T:  11/21/2003  Job:  [id number]

## 2011-02-08 NOTE — Discharge Summary (Signed)
[facility]  Patient:    [name], [name] [facility]                   MRN: [id number] Adm. Date:  [id number] Disc. Date: [id number] Attending:  Lubertha South Dictator:   Alexzandrew L. Perkins, P.A.-C.                           Discharge Summary  ADMITTING DIAGNOSES: 1. 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 2. Asthma. 3. Hypertension. 4. History of renal calculi.  DISCHARGE DIAGNOSES: 1. 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 2. Mole left posterior upper chest. 3. Status post right L1-2 partial hemilaminectomy with microdiskectomy with    right L2 hemilaminectomy and lateral recessed decompression at the L2-3    level. 4. Status post excisional biopsy of mole, left posterior mid thorax level. 5. 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 6. Asthma. 7. Hypertension. 8. History of renal calculi.  PROCEDURE:  The patient was taken to the OR on September 18, 2000 and underwent a right L1-2 partial hemilaminectomy with microdiskectomy, a right L2 hemilaminectomy with lateral recessed decompression at the L2-3 level and also an excisional biopsy of a mole on the left posterior mid thorax level. Surgeon, Dr. Vira Browns. Assistant, Dr. Marlowe Kays. The surgery was done under general anesthesia.  CONSULTATIONS:  None.  BRIEF HISTORY:  The patient is a 70 year old female whose been seen by Dr. [name] for continued and progressive problems concering her back with some pain in the lower extremity and groin region with the most discomfort when she is bending or standing for long periods of time even with short distances of ambulation. She is now having problems sleeping at night due to her pain. She is seen in the office and found to have some weakness in her hip flexors. She was sent for an MRI which did show protrusion of the L1-2 disk at the  level of the conus as well as L2-3. Some foraminal stenosis was also noted. Due to the positive findings and the fact that she is not improved with medications of Medrol Dosepak as well as anti-inflammatories, it is felt that she would benefit from undergoing surgical intervention. The patient was subsequently admitted to the hospital.  LABORATORY DATA:  CBC on admission showed a hemoglobin of 14.7, hematocrit of 41.0, white cell count 9.2, red cell count 4.66. Follow-up H&H postoperatively, hemoglobin and hematocrit of 13.9 and 39.4. PT and PTT on admission were 14.6 and 28 respectively and INR of 1.3. Chem panel on admission, slightly low potassium at 3.1, slightly elevated glucose of 130. Remaining chem panel all within normal limits.  Urinalysis on admission was positive for protein and 1.0 urobilinogen and many epithelial cells with only 0-5 white cells per high powered field otherwise urinalysis was negative.  Intraoperative lumbar films taken on September 18, 2000 shows surgical instruments at the process of L1 for localization of the L1 and L1-2 disk space.  HOSPITAL COURSE:  The patient admitted to [facility], taken to the OR and underwent the above stated procedure without complications. The patient tolerated the procedure well and was later transferred to the recovery room and then to the orthopedic floor for continued postop care. Vital signs were followed on the patient throughout the hospital course. The patient  was placed on p.o. and IV analgesics for pain control following surgery. She was encouraged to be out of bed. She did have some pain in the back region; however, her leg pain had improved by day one. She underwent an occupational therapy consult during the hospital. She did well with her ADLs and also did well with her ambulation by the following day of September 20, 2000. The patients doing much better. She was tolerating her analgesics by mouth without  difficulty and it was decided the patient would be discharged home and would follow-up in the office.  DISCHARGE PLANS:  The patient discharged home on September 20, 2000.  DISCHARGE DIAGNOSES:  Same as above.  DISCHARGE MEDICATIONS: 1. OxyContin 10 mg #40 q. 12h. 2. OxyIR 5 mg #40 1 or 2 q.4-6h. as needed for pain. 3. Robaxin 500 mg #40 with 2 refills t.i.d. p.r.n.  FOLLOW-UP:  She is to follow-up in the office in two weeks from the date of surgery, call the office for an appointment at [phone].  DISCHARGE ACTIVITY:  Back precautions, up as tolerated.  DISPOSITION:  Home.  CONDITION ON DISCHARGE:  Stable and improved. DD:  10/13/00 TD:  10/13/00 Job: [id number]

## 2011-02-08 NOTE — Discharge Summary (Signed)
[name], [name]              ACCOUNT NO.:  [id number]   MEDICAL RECORD NO.:  [id number]          PATIENT TYPE:  INP   LOCATION:  5148                         FACILITY:  [facility]   PHYSICIAN:  Kela Millin, M.D.DATE OF BIRTH:  01/23/1941   DATE OF ADMISSION:  12/04/2008  DATE OF DISCHARGE:  12/13/2008                               DISCHARGE SUMMARY   DISCHARGE DIAGNOSES:  1. Gastrointestinal bleed - likely secondary to arteriovenous      malformations noted on a capsule endoscopy.  2. Acute blood loss anemia - secondary to #1.  3. 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  4. History of gastric arteriovenous malformations with prior history      of gastrointestinal bleed.  5. Hypertension.  6. Hyperlipidemia.  7. Chronic headaches.  8. History of anxiety.  9. History of chronic back pain.  10.Pulmonary edema.  11.Bronchitis.   PROCEDURES AND STUDIES:  1. A 2-D echocardiogram - ejection fraction 60%-65%.  No left      ventricular regional wall motion abnormalities.  Doppler parameters      consistent with elevated mean left atrial filling pressure.  Study      inadequate to rule out the possibility of a bicuspid aortic valve.      Findings consistent with severe aortic valve stenosis.  2. EGD - normal endoscopy per Dr. Madilyn Fireman.  3. Capsule endoscopy - arteriovenous malformations, nonbleeding at      present.  Several nonbleeding AVMs, one in the distal duodenum or      proximal jejunum, 4 or 5 more seen in the mid to distal small      bowel.  4. Cardiac catheterization - moderate diffuse coronary artery disease,      most notable 40%-50% left anterior descending at the bifurcation of      the diagonal branch with 60% ostial diagonal disease also.      Circumflex artery - there is up to 60% tapering of the      atrioventricular groove circumflex artery.  Right coronary artery      with moderate diffuse disease, small-caliber vessel.   Normal right-      sided heart pressures.  No evidence of pulmonary hypertension.   CONSULTATIONS:  1. Gastroenterology - Dr. Dorena Cookey  2. Cardiology - Dr. Donato Schultz  3. CVTS - Dr. Evelene Croon   BRIEF HISTORY:  The patient is a 70 year old black female with the above-  listed medical problems who presented with complaints of shortness of  breath.  She reported that she was seen by her primary care physician 10  days prior to her admission with shortness of breath and swollen glands.  At that time she was diagnosed with bronchitis, started on amoxicillin.  She reported subjective fevers during that time.  Her symptoms failed to  improve and she continued to have dyspnea on exertion and noticed that  her hands were very pale.  She came to the ED and was noted to be  hypotensive with a blood pressure in the 80s.  She subsequently dropped  her blood pressure to 60 and when her labs came back, they revealed a  hemoglobin of 4.  She was transfused and fluid resuscitated with  improvement in her blood pressures.  She was evaluated by critical care  but her blood pressure had improved at that point and so admission to  the Hospitalist Service was requested.  She denied diarrhea,  constipation and was unable to tell if there was blood on not in her  stool.  She was admitted for further evaluation and management.   Please see the full admission history and physical dictated on December 05, 2008 by Dr. Loma Sender for the details of the admission, physical exam  as well as the laboratory data.   HOSPITAL COURSE:  Problem #1.  GI bleed - as discussed above, upon  admission she was found to have a hemoglobin of 4 and she was transfused  4 units of packed red blood cells and fluid resuscitated.  Gastroenterology was consulted and Dr. Madilyn Fireman saw the patient and an EGD  was done on admission.  It was normal with no bleeding source.  He  followed up with the patient and a capsule endoscopy was done  on Feb 06, 2009 and the results as stated above.  Following this, the patient's  hemoglobins were monitored and remained stable.  She was discharged on  iron supplements and was to follow up with GI.  She was instructed to  discontinue NSAIDS - Naprosyn as well as meloxicam which she was on  previously.  As mentioned above, the patient had a prior history of  gastric AVMs and a capsule endoscopy showed AVMs as well and they were  nonbleeding at the time.  The impression was that this was the source of  her blood loss.  Problem #2.  Acute blood loss anemia with hemorrhagic shock - on  presentation the patient had a hemoglobin of 4 as noted above and was  hypotensive, reaching a low blood pressure in the 60s systolic in the  ED.  As already mentioned, she was transfused packed red blood cells,  volume resuscitated and with that her blood pressures improved.  A set  of cardiac enzymes was negative for MI.  With volume resuscitation, her  hypotension resolved and she remained hemodynamically stable.  Her  hemoglobins were monitored following the transfusion and remained  stable.  Her last hemoglobin prior to discharge from the hospital was  10.8 with hematocrit of 32.7.  As already discussed above, she was  instructed to discontinue NSAIDs including naproxen and meloxicam.  Problem #3.  Severe aortic stenosis - with pulmonary edema in the  hospital following aggressive volume resuscitation.  A 2-D  echocardiogram was done to further evaluate the etiology of the  patient's episode of pulmonary edema which was associated with elevated  brain natriuretic peptide.  The 2-D echo report is as above, consistent  with severe aortic stenosis.  Dr. 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  Following  these findings, a cardiac catheterization was done to evaluate for  coronary artery disease and the findings are as stated above.  CVTS was  was consulted for possible aortic valve replacement and Dr. Laneta Simmers saw  the patient and evaluated her and following his evaluation, he stated  that the left circumflex and right coronary arteries should probably be  grafted at the time of the aortic valve replacement.  He stated that he  did not think that the LAD stenosis was tight enough to warrant LIMA.  He discussed aortic valve replacement and CABG with the patient and the  plan was for the patient to be discharged home and follow up at his  office at which time the surgery would be scheduled in the first week in  April.  The patient remained hemodynamically stable and her hemoglobin  also was stable and she was discharged home as recommended for  outpatient followup.  Problem #4.  Bronchitis - it was treated with antibiotics, steroid taper  and bronchodilators.  With this, her symptoms improved.  She was to  follow up with her primary care physician.  Problem #5.  History of hypertension - as discussed above, she was  hypotensive on admission and her anti-hypertensives were held and she  was volume resuscitated as discussed above.  With this, the hypotension  resolved and her blood pressures have remained stable.  She was to  resume her outpatient medications and follow up with her primary care  physician.  Problem #6.  Chronic back pain - as already discussed above, all anti-  inflammatories were discontinued and the patient was to continue  tramadol as needed for back pain.  Problem #7.  Pulmonary edema - she was diuresed with Lasix and with this  the pulmonary edema resolved.  A 2-D echocardiogram was done to further  evaluate and it revealed the severe aortic stenosis.  The impression was  that the severe AS in the setting  of aggressive volume resuscitation was  the cause of the pulmonary edema.   DISCHARGE MEDICATIONS:  1. Nu-Iron 150 mg one p.o. b.i.d.  2. Senokot 2 p.o. q.h.s., hold if diarrhea.  3. Prednisone taper as directed.  4. Verapamil 240 mg p.o. daily.  5. Lisinopril/HCT 20/25 mg p.o. daily.  6. Lipitor 20 mg p.o. daily.  7. KCl 10 mEq p.o. daily.  8. Ativan 0.5 mg half to 1 tablet p.r.n.  9. Tramadol 50 mg p.o. p.r.n. as previously.  10.Ventolin 2 puffs q.4-6 hours p.r.n.   Patient instructed to discontinue amoxicillin, meloxicam, naproxen.   FOLLOWUP CARE:  1. Dr. Sharee Pimple office to call the patient to schedule surgery first      week of April.  2. Dr. Madilyn Fireman with [facility] on December 26, 2008 at 10:30      a.m.  3. Dr. Wynelle Link of [facility] in 1 week.  The patient to call for      appointment.  4. Dr. Anne Fu on December 27, 2008 at 9:45 a.m.   DISCHARGE CONDITION:  Improved/stable.      Kela Millin, M.D.  Electronically Signed     Kela Millin, M.D.  Electronically Signed    ACV/MEDQ  D:  02/23/2009  T:  02/23/2009  Job:  [id number]   cc:   Jake Bathe, MD  Deatra James, M.D.  Evelene Croon, M.D.  John C. Madilyn Fireman, M.D.

## 2011-02-08 NOTE — H&P (Signed)
[facility]  Patient:    [name], [name] Visit Number: [id number] MRN: [id number]          Service Type: Attending:  Kerrin Champagne, M.D. Dictated by:   Alexzandrew L. Perkins, P.A.-C. Adm. Date:  02/16/02                           History and Physical  CHIEF COMPLAINT:  Left leg pain and weakness and right hip pain.  HISTORY OF PRESENT ILLNESS:  The patient is a 70 year old African-American female who has been seen in consultation by Dr. Vira Browns for the above-stated complaints.  She has been seen and evaluated and felt to have left L4 radiculopathy with also weakness noted in her left foot and also in her left knee.  She has been treated in the past and has previously undergo selective nerve root blocks which only provided her minimal relief.  She has continued to have persistent pain and weakness.  It is felt due to her progression of her condition that she would benefit from undergoing surgical intervention.  The risks and benefits have been discussed with the patient. She has elected to proceed with microdiskectomy.  ALLERGIES:  No known drug allergies.  CURRENT MEDICATIONS: 1. Verapamil SR 240 mg 1 p.o. q.d. 2. Vicodin as needed for pain. 3. Premarin 0.625 mg 1 p.o. q.d. 4. Klor-Con 20 mEq 1 p.o. q.d. 5. Protonix 40 mg 1 p.o. q.d. 6. Lorazepam 0.5 mg 1 p.o. q.d. p.r.n. 7. Diovan/hydrochlorothiazide 160/12.5 mg 1 p.o. q.d.  PAST MEDICAL HISTORY: 1. Hypertension. 2. Hiatal hernia. 3. Diverticulosis.  PAST SURGICAL HISTORY: 1. Previous back surgery at level L1-2. 2. Cholecystectomy. 3. Heel spur excisions. 4. Leg surgery for Achilles tendon repair.  SOCIAL HISTORY:  She is widowed.  One pack per day smoker.  Denies use of alcohol products.  Has two children.  One of her daughters will be assisting with her care postoperatively.  She has a one-story home with four stairs entering.  FAMILY HISTORY:  Mother living, age 68, with a  history of hypertension and arthritis.  Father deceased in his 28s with liver cirrhosis history.  REVIEW OF SYSTEMS:  GENERAL:  No fevers, chills, or night sweats.  NEUROLOGIC: No seizures, syncope, or paralysis.  RESPIRATORY:  The patient does have some intermittent shortness of breath.  She attributes this to her smoking.  No hemoptysis.  CARDIOVASCULAR:  She does have hypertension.  However, no chest pain, angina, or orthopnea.  GASTROINTESTINAL:  She does have a hiatal hernia. However, denies any nausea, vomiting, diarrhea, constipation, or blood or mucous in her stool.  She also has a history of diverticulosis.  She has not had a recent flare-up.  GENITOURINARY:  No dysuria, hematuria, or discharge. MUSCULOSKELETAL:  Pertinent to that of the back, the left leg in the history of present illness.  PHYSICAL EXAMINATION:  VITAL SIGNS:  Pulse 76, respirations 20, blood pressure 132/78.  GENERAL:  The patient is a 70 year old African-American female, well-nourished, well-developed, short in stature.  In no acute distress.  She is alert, oriented, and cooperative at the time of exam.  HEENT:  Normocephalic, atraumatic.  Pupils are round and reactive.  Oropharynx clear.  EOMs are intact.  The patient is noted to have partial upper dentures.  NECK:  Supple.  No carotid bruits.  CHEST:  Clear to auscultation in anterior and posterior chest walls.  No rhonchi, rales, or wheezes.  HEART:  Regular rate and rhythm.  There is a grade 3/6 systolic ejection murmur.  ABDOMEN:  Soft and nontender.  Bowel sounds are present.  Round, protuberant abdomen.  RECTAL, BREASTS, GENITOURINARY:  Not done, not pertinent to present illness.  EXTREMITIES:  Limited to that of the left lower extremity.  She does have some weakness noted in the left knee extension with the quad muscles.  Also, weakness noted with left foot dorsiflexion.  She does ambulate with a somewhat antalgic gait and limp.   Otherwise, sensation is intact throughout the left lower extremity.  IMPRESSION: 1. 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 2. Hypertension. 3. Hiatal hernia. 4. Diverticulosis.  PLAN:  The patient will be admitted to [facility] to undergo a left L4-5 microdiskectomy.  Surgery will be performed by Dr. Vira Browns. Dictated by:   Alexzandrew L. Perkins, P.A.-C. Attending:  Kerrin Champagne, M.D. DD:  02/09/02 TD:  02/10/02 Job: [id number]

## 2011-02-08 NOTE — Discharge Summary (Signed)
[facility]  Patient:    [name], [name]                     MRN: [id number] Adm. Date:  [id number] Disc. Date: [id number] Attending:  Anastasio Weeks CC:         [name], M.D.             [name], M.D.             [name]                           Discharge Summary  DATE OF BIRTH: August 02, 1941  DISCHARGE DIAGNOSES:  1. Acute diverticulitis.     a. No evidence of abscess by CT scan.  2. Hypokalemia.     a. Secondary to diuretic and dehydration.  3. Hypertension.  4. Gastroesophageal reflux disease.  5. Transient hypotension secondary to Verapamil.  6. Postmenopausal.     a. Status post hysterectomy and unilateral oophorectomy.  7. Chronic tobacco use.  8. Anxiety.  9. Achilles tendon repair on December 05, 1999 by [name].     a. Left foot casted.     b. Planned removal on January 14, 2000. 10. Status post cholecystectomy. 11. Carpal tunnel repair in 1993. 12. No know drug allergies.  DISCHARGE MEDICATIONS:  1. Augmentin 875 mg 1 p.o. b.i.d. (new) through January 11, 2000 (14 day total     antibiotics).  2. Prilosec 20 mg q.d. x 1 month (new).  (Discontinue if empirically improves     heartburn symptoms).  3. Verapamil 180 mg 1/2 tablet q.a.m.  4. Premarin 0.625 mg q.d.  *The patient was instructed not to take Maxzide or potassium.  DISCHARGE CONDITION: Stable.  Pain controlled.  Tolerating low residual diet and antibiotics.  RECOMMENDED ACTIVITY: As tolerated.  The patient should continue to use her walker as directed by the orthopedic physician.  RECOMMENDED DIET: Low residue diet.  Nutrition educated on this.  FOLLOW-UP: The patient is to see Dr. Blair Weeks on Thursday, January 03, 2000, at 11:15 a.m. to have potassium rechecked as well as her blood pressure and to repeat the abdominal examination.  She should keep her orthopedic appointment on January 14, 2000.  She is to have a CBC repeated in  approximately three to four weeks.  She knows to contact [name] is she has worsening abdominal pain, fevers, or if she has trouble taking the Augmentin.  CONSULTATION: [name], M.D.  PROCEDURES: Abdominal CT on December 28, 1999 showing the abdomen to be unremarkable except for status post cholecystectomy and bilateral renal cysts. There is evidence of diverticulosis of the sigmoid colon with diverticulitis. No evidence of abscess, free fluid, or air.  HOSPITAL COURSE: #1 - ACUTE DIVERTICULITIS: Ms. [name] is a 70 year old African-American female who presented with a five day history of lower abdominal pain which had increased in intensity.  Evaluation in the emergency department included a CT scan which demonstrated diverticulitis.  She was admitted for IV antibiotics and bowel rest.  She received Unasyn.  She remained afebrile.  Her diet was slowly advanced to a low residue diet, which she was counseled on.  She will go home on this as well as Augmentin to complete a total of 14 days of antibiotics.  She will follow up with [name] or contact him sooner if her abdominal pain worsens.  #2 - HYPOKALEMIA:  Ms. [name] had not been eating or drinking well prior to admission and she had been continuing to take her Maxzide 25 mg on Monday, Wednesday, and Friday along with her K-Dur 20 mEq Monday, Wednesday, and Friday.  I believe that she was profoundly potassium depleted and because her blood pressure was within normal limits upon discharge I opted to hole her Maxzide and her potassium.  She will follow up with [name] for further instructions.  #3 - HYPERTENSION: Ms. [name] is on Maxzide, as stated above, and Verapamil 90 mg q.d.  She was placed on the long-acting initially and this resulted in some hypotension.  She was switched back to the short-acting Verapamil without difficulty.  #4 - TOBACCO USE: The patient was counseled on the importance of  smoking cessation.  #5 - RECENT ANKLE SURGERY: Ms. [name] was placed on Lovenox here in the hospital because she as relatively immobile secondary to the abdominal pain. This was stopped upon discharge.  She will keep her follow-up appointments with her orthopedic surgeon.  #6 - MILD ANEMIA: Ms. [name] had a hemoglobin of 14.3 on admission.  Upon discharge it had fallen to 11.2 with an MCV of 92.  This is most likely secondary to the acute infection and it was recommended to repeat this in three to four weeks.  She had no obvious evidence of bleeding.  DISCHARGE LABORATORY DATA: CBC showed hemoglobin as started above, WBC 9100, platelets 211,000.  Sodium 135, potassium 3.6, chloride 103, bicarbonate 28, glucose 79, BUN 6, creatinine 0.6.  Calcium 8.6, albumin 2.5, SGOT 30, SGPT 26, alkaline phosphatase 138, total bilirubin 0.9. DD:  01/28/00 TD:  01/29/00 Job: [id number]

## 2011-02-08 NOTE — H&P (Signed)
[facility]  Patient:    [name], [name]                    MRN: [id number] Adm. Date:  09/18/00 Attending:  Kerrin Champagne, M.D. Dictator:   Kerrin Champagne, M.D.                         History and Physical  DATE OF BIRTH:  1940-11-23.  CHIEF COMPLAINT:  "Pain in my back."  PRESENT ILLNESS:  This 70 year old female was seen by Dr. Kerrin Champagne for continuing and progressive problems concerning her low back, with some pain into the lower extremities and the groin area.  She has most discomfort when she is bending or standing for any length of time; walking even short distances has also causes somewhat of a problem.  Now, she is having trouble sleeping at night due to the pain in the right lower back and the right groin area.  Examination has shown weakness in the hip flexors, negative straight leg raising and popliteal compressions bilaterally.  Reversed SLR is also negative.  The patients MRI has shown significant protrusion of the L1-2 disk at the level of the conus as well as at L2-3.  Foraminal stenosis is noted to be most explicit here at this level at L2-3.  This was prominently on the right.  Due to these positive findings and the fact that the patient really is not improved with Medrol Dospaks as well as nonsteroidal anti-inflammatories, exercise and rest, it is felt that she would benefit from surgical intervention and will be admitted for a central laminectomies at L1-L2, L2-L3, with microdiskectomy at L1-L2.  PAST MEDICAL HISTORY:  This lady has been relatively good health throughout her lifetime.  She has some hypertension which has been going on for about 10 years and asthma for about 21 years.  She had renal calculi without sequelae in the year 2000.  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  She also had repair of ruptured Achilles tendon, which was quite successful as well.  She has calcaneal  spurs.  CURRENT MEDICATIONS 1. Verapamil SR 150 mg one q.d. 2. Diovan HCT 160/12.5 mg one q.d. 3. Premarin 0.0625 mg one q.d.  ALLERGIES:  No known drug allergies.  SOCIAL HISTORY:  The patient smokes about a pack of cigarettes per day and denies any intake of ETOH.  Lives alone; her daughter will help her after surgery.  FAMILY HISTORY:  Family history is positive for OA in the mother and hypertension.  Father died of liver disease.  REVIEW OF SYSTEMS:  CNS:  No seizure disorder, paralysis, numbness or double vision.  RESPIRATORY:  Patient does have asthma but without productive cough and minimal shortness of breath.  CARDIOVASCULAR:  No chest pain.  No angina. No orthopnea.  GI:  No nausea, vomiting, melena or bloody stools. GENITOURINARY:  No discharge, dysuria or hematuria.  MUSCULOSKELETAL:  Please see present illness above.  PHYSICAL EXAMINATION  GENERAL:  Alert and cooperative, friendly 70 year old white female whose vital signs are:  Blood pressure 130/90, respirations 16 and unlabored, pulse of 60 and regular.  HEENT:  Normocephalic.  PERLA.  EOM intact.  NECK:  Supple.  No lymphadenopathy.  Negative for bruit.  CHEST:  Clear to auscultation.  No rhonchi nor rales.  HEART:  Regular rate and rhythm.  No murmurs are heard.  ABDOMEN:  Soft and nontender.  Liver and spleen no felt.  GENITALIA/PELVIC:  Not done, not pertinent to present illness.  RECTAL:  Not done, not pertinent to present illness.  BREASTS:  Not done, not pertinent to present illness.  EXTREMITIES:  Lower extremity examination as well as back as in present illness above.  ADMITTING DIAGNOSES 1. 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 2. Asthma. 3. Hypertension. 4. History of renal calculi.  PLAN:  The patient will undergo central lumbar laminectomies, L1-L2, L2-L3, with a microdiskectomy at L1-L2.  The patient has donated two units of autologous blood for  this procedure. DD:  09/11/00 TD:  09/12/00 Job: [id number]

## 2011-06-21 LAB — CBC
HCT: 25.6 — ABNORMAL LOW
Hemoglobin: 11 — ABNORMAL LOW
MCHC: 31.7
MCHC: 32.6
MCHC: 32.6
MCV: 88.2
MCV: 88.4
MCV: 88.9
Platelets: 220
Platelets: 268
RBC: 3.6 — ABNORMAL LOW
RBC: 3.82 — ABNORMAL LOW
RDW: 14.7
RDW: 15.1

## 2011-06-21 LAB — DIFFERENTIAL
Basophils Absolute: 0.1
Lymphocytes Relative: 18
Lymphs Abs: 2.1
Neutrophils Relative %: 75

## 2011-06-21 LAB — CROSSMATCH
ABO/RH(D): O POS
Antibody Screen: POSITIVE
DAT, IgG: NEGATIVE
Donor AG Type: NEGATIVE

## 2011-06-21 LAB — COMPREHENSIVE METABOLIC PANEL
ALT: 16
AST: 21
Albumin: 3 — ABNORMAL LOW
BUN: 12
CO2: 26
CO2: 27
Calcium: 8.9
Calcium: 9.3
Creatinine, Ser: 0.63
Creatinine, Ser: 0.65
GFR calc Af Amer: >60
GFR calc Af Amer: >60
GFR calc non Af Amer: >60
GFR calc non Af Amer: >60
Glucose, Bld: 95
Sodium: 142
Total Bilirubin: 0.6
Total Protein: 6.2

## 2011-06-21 LAB — BASIC METABOLIC PANEL
CO2: 26
Calcium: 8.7
Chloride: 104
Creatinine, Ser: 0.63
Glucose, Bld: 101 — ABNORMAL HIGH
Sodium: 137

## 2011-06-21 LAB — IRON: Iron: <10 — ABNORMAL LOW

## 2011-06-21 LAB — LIPASE, BLOOD: Lipase: 30

## 2011-06-21 LAB — APTT: aPTT: 35

## 2011-06-21 LAB — HEMOGLOBIN AND HEMATOCRIT, BLOOD
HCT: 32.3 — ABNORMAL LOW
Hemoglobin: 10.6 — ABNORMAL LOW

## 2011-06-21 LAB — GLUCOSE, CAPILLARY

## 2011-06-21 LAB — PROTIME-INR: INR: 1.1

## 2011-07-05 LAB — URINALYSIS, ROUTINE W REFLEX MICROSCOPIC
Bilirubin Urine: NEGATIVE
Glucose, UA: NEGATIVE
Hgb urine dipstick: NEGATIVE
Ketones, ur: NEGATIVE
Nitrite: POSITIVE — AB
Protein, ur: NEGATIVE
Specific Gravity, Urine: 1.014
Urobilinogen, UA: 0.2
pH: 6

## 2011-07-05 LAB — URINE CULTURE

## 2011-07-05 LAB — URINE MICROSCOPIC-ADD ON

## 2011-08-26 ENCOUNTER — Other Ambulatory Visit: Payer: Self-pay | Admitting: Gastroenterology

## 2011-08-29 ENCOUNTER — Ambulatory Visit
Admission: RE | Admit: 2011-08-29 | Discharge: 2011-08-29 | Disposition: A | Discharge status: Home / Self Care | Payer: Medicare HMO | Source: Ambulatory Visit | Attending: Gastroenterology | Admitting: Gastroenterology

## 2011-08-29 MED ORDER — IOHEXOL 300 MG/ML  SOLN
125.0000 mL | Freq: Once | INTRAMUSCULAR | Status: AC | PRN
  Administered 2011-08-29: 125 mL via INTRAVENOUS

## 2011-09-13 ENCOUNTER — Other Ambulatory Visit: Payer: Self-pay

## 2011-09-13 DIAGNOSIS — I700 Atherosclerosis of aorta: Secondary | ICD-10-CM

## 2011-10-14 ENCOUNTER — Encounter: Payer: Self-pay | Admitting: Vascular Surgery

## 2011-10-15 ENCOUNTER — Encounter: Payer: Self-pay | Admitting: Vascular Surgery

## 2011-10-16 ENCOUNTER — Ambulatory Visit (INDEPENDENT_AMBULATORY_CARE_PROVIDER_SITE_OTHER): Payer: Medicare Other | Admitting: *Deleted

## 2011-10-16 ENCOUNTER — Ambulatory Visit (INDEPENDENT_AMBULATORY_CARE_PROVIDER_SITE_OTHER): Payer: Medicare Other | Admitting: Vascular Surgery

## 2011-10-16 ENCOUNTER — Encounter: Payer: Self-pay | Admitting: Vascular Surgery

## 2011-10-16 VITALS — BP 136/88 | HR 64 | Resp 16 | Ht 60.0 in | Wt 164.0 lb

## 2011-10-16 DIAGNOSIS — I700 Atherosclerosis of aorta: Secondary | ICD-10-CM

## 2011-10-16 DIAGNOSIS — I739 Peripheral vascular disease, unspecified: Secondary | ICD-10-CM

## 2011-10-16 DIAGNOSIS — I75029 Atheroembolism of unspecified lower extremity: Secondary | ICD-10-CM

## 2011-10-16 NOTE — Assessment & Plan Note (Signed)
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 She has normal femoral waveforms and palpable femoral pulses. I cannot palpate pedal pulses. She has a biphasic posterior tibial signal with a monophasic dorsalis pedis signal. He has mild claudication. At this point I do not think any further vascular workup is indicated. I have offered to refer her to become tobacco cessation program which she declined. We have discussed the importance of tobacco cessation. Also instructed her to try to get on a structured walking program. I've ordered follow up ABIs in one year now see her back at that time. She knows to call sooner if she has problems.

## 2011-10-16 NOTE — Progress Notes (Signed)
[facility] [place]  Patient name: [name] MRN: [id number] DOB: 27-Oct-1940 Sex: female  REASON FOR CONSULT: atherosclerotic disease of the infrarenal aorta. Further by Dr.Ganem   HPI: [name] is a 71 y.o. female who is had several months of abdominal pain. Her workup included a CT scan of the abdomen and pelvis which was performed on 08/29/2011. This showed atherosclerotic disease of the infrarenal aorta. She was sent for vascular surgery consultation. She states that her abdominal pain is sometimes associated with eating. She states the pain occurs right when she eats. She denies any weight loss. She has had previous abdominal surgery for diverticular disease. She's also had previous cholecystectomy and hysterectomy.  She does describe some pain in her calves assisted with ambulation although this also occurs simply with standing. He may have some mild calf claudication bilaterally. She denies any history of rest pain in his had no history of nonhealing ulcers.  Past Medical History  Diagnosis Date  . Hypertension   . GERD (gastroesophageal reflux disease)   . Anxiety   . Hyperlipidemia   . History of GI bleed     With AVM  . Anemia     Iron deficiency anemia  . Shoulder impingement syndrome     Right shoulder  . Aortic stenosis   . CAD (coronary artery disease)   . Abdominal pain, unspecified site   . Myocardial infarction 2010  . Peripheral vascular disease     Family History  Problem Relation Age of Onset  . Hypertension Mother     SOCIAL HISTORY: History  Substance Use Topics  . Smoking status: Current Everyday Smoker -- 0.3 packs/day  . Smokeless tobacco: Not on file  . Alcohol Use: No    Allergies  Allergen Reactions  . Codeine Other (See Comments)    Feeling intoxicated    Current Outpatient Prescriptions  Medication Sig Dispense Refill  . AMLODIPINE BESYLATE PO Take 10 mg by mouth daily.      Marland Kitchen aspirin 81 MG tablet  Take 81 mg by mouth daily.      Marland Kitchen atorvastatin (LIPITOR) 80 MG tablet Take 80 mg by mouth daily.      . Calcium Carbonate-Vitamin D (CALCIUM 600+D) 600-400 MG-UNIT per tablet Take 1 tablet by mouth daily.      . COD LIVER OIL W/VIT A & D PO Take by mouth.      . ferrous sulfate 325 (65 FE) MG tablet Take 325 mg by mouth daily with breakfast.      . glycopyrrolate (ROBINUL-FORTE) 2 MG tablet Take 2 mg by mouth 2 (two) times daily.      Marland Kitchen lisinopril-hydrochlorothiazide (PRINZIDE,ZESTORETIC) 10-12.5 MG per tablet Take 1 tablet by mouth daily.      Marland Kitchen liver oil-zinc oxide (DESITIN) 40 % ointment Apply topically as needed.      Marland Kitchen LORazepam (ATIVAN) 0.5 MG tablet Take 0.5 mg by mouth at bedtime as needed.      . nebivolol (BYSTOLIC) 5 MG tablet Take 5 mg by mouth daily.      Marland Kitchen omeprazole (PRILOSEC) 40 MG capsule Take 40 mg by mouth daily.      . potassium chloride (K-DUR,KLOR-CON) 10 MEQ tablet Take 10 mEq by mouth 2 (two) times daily.      . Prenatal Vit-Fe Fumarate-FA (M-VIT) tablet Take 1 tablet by mouth daily.      . vitamin E 400 UNIT capsule Take 400 Units by mouth daily.      Marland Kitchen  Vitamins A & D (VITAMIN A & D) 10000-400 UNITS CAPS Take by mouth.        REVIEW OF SYSTEMS: Arly.Keller ] denotes positive finding; [  ] denotes negative finding  CARDIOVASCULAR:  [ ]  chest pain   [ ]  chest pressure   [ ]  palpitations   [ ]  orthopnea   Arly.Keller ] dyspnea on exertion   Arly.Keller ] claudication   [ ]  rest pain   [ ]  DVT   [ ]  phlebitis PULMONARY:   [ ]  productive cough   [ ]  asthma   [ ]  wheezing NEUROLOGIC:   [ ]  weakness  Arly.Keller ] paresthesias mostly lat left thing  [ ]  aphasia  [ ]  amaurosis  [ ]  dizziness HEMATOLOGIC:   [ ]  bleeding problems   [ ]  clotting disorders MUSCULOSKELETAL:  [ ]  joint pain   [ ]  joint swelling [ ]  leg swelling GASTROINTESTINAL: [ ]   blood in stool  [ ]   hematemesis GENITOURINARY:  [ ]   dysuria  [ ]   hematuria PSYCHIATRIC:  [ ]  history of major depression INTEGUMENTARY:  [ ]  rashes  [ ]   ulcers CONSTITUTIONAL:  [ ]  fever   [ ]  chills  PHYSICAL EXAM: Filed Vitals:   10/16/11 0923  BP: 136/88  Pulse: 64  Resp: 16  Height: 5' (1.524 m)  Weight: 164 lb (74.39 kg)  SpO2: 98%   Body mass index is 32.03 kg/(m^2). GENERAL: The patient is a well-nourished female, in no acute distress. The vital signs are documented above. CARDIOVASCULAR: There is a regular rate and rhythm without significant murmur appreciated. I do not detect carotid bruits. She has palpable femoral pulses. I cannot palpate popliteal or pedal pulses. She has no significant lower extremity swelling PULMONARY: There is good air exchange bilaterally without wheezing or rales. ABDOMEN: Soft and non-tender with normal pitched bowel sounds. I do not appreciate an abdominal bruit. MUSCULOSKELETAL: There are no major deformities or cyanosis. NEUROLOGIC: No focal weakness or paresthesias are detected. SKIN: There are no ulcers or rashes noted. PSYCHIATRIC: The patient has a normal affect.  DATA:  I have reviewed her CT scan. This shows moderate atherosclerotic disease of her infrarenal aorta. Celiac axis is patent. There is some calcium near the origin. The superior mesenteric artery and inferior mesenteric artery are patent without calcific disease at their origins.  Have independently interpreted her arterial Doppler study today. This shows an ABI of 100% on the right and 89% on the left. She has triphasic femoral waveforms and biphasic posterior tibial waveforms. The dorsalis pedis signals are monophasic bilaterally.  MEDICAL ISSUES: Atherosclerosis of abdominal aorta CT scan of the abdomen and pelvis shows atherosclerotic disease of the aorta. The celiac axis, superior mesenteric artery, and inferior mesenteric artery appear to be patent. There is some calcium at the origin of the celiac axis but none at the origin of the SMA or IMA. He does have some postprandial abdominal pain however she has had no weight loss.  In addition, her pain occurs immediately after eating which is typically not characteristic of pain associated with mesenteric ischemia which occurs 10-15 minutes later. Given that she has had no weight loss and the CT findings I do not think her abdominal pain is related to chronic mesenteric ischemia. No other source for her abdominal pain can be found then consideration could be given to a mesenteric arteriogram although I think the yield would be very low. I discussed the importance of tobacco cessation.  I plan on seeing her back in one year to follow her peripheral vascular disease. He knows to call sooner if necessary.  PVD (peripheral vascular disease) Based on her lower extremity arterial Doppler study, she has evidence of infrainguinal arterial occlusive disease bilaterally. She has normal femoral waveforms and palpable femoral pulses. I cannot palpate pedal pulses. She has a biphasic posterior tibial signal with a monophasic dorsalis pedis signal. He has mild claudication. At this point I do not think any further vascular workup is indicated. I have offered to refer her to become tobacco cessation program which she declined. We have discussed the importance of tobacco cessation. Also instructed her to try to get on a structured walking program. I've ordered follow up ABIs in one year now see her back at that time. She knows to call sooner if she has problems.    Loyal Rudy S [facility] [place] Beeper: [phone]

## 2011-10-16 NOTE — Assessment & Plan Note (Signed)
CT scan of the abdomen and pelvis shows atherosclerotic disease of the aorta. The celiac axis, superior mesenteric artery, and inferior mesenteric artery appear to be patent. There is some calcium at the origin of the celiac axis but none at the origin of the SMA or IMA. He does have some postprandial abdominal pain however she has had no weight loss. In addition, her pain occurs immediately after eating which is typically not characteristic of pain associated with mesenteric ischemia which occurs 10-15 minutes later. Given that she has had no weight loss and the CT findings I do not think her abdominal pain is related to chronic mesenteric ischemia. No other source for her abdominal pain can be found then consideration could be given to a mesenteric arteriogram although I think the yield would be very low. I discussed the importance of tobacco cessation. I plan on seeing her back in one year to follow her peripheral vascular disease. He knows to call sooner if necessary.

## 2012-10-13 ENCOUNTER — Encounter: Payer: Self-pay | Admitting: Neurosurgery

## 2012-10-14 ENCOUNTER — Ambulatory Visit (INDEPENDENT_AMBULATORY_CARE_PROVIDER_SITE_OTHER): Payer: Medicare Other | Admitting: Neurosurgery

## 2012-10-14 ENCOUNTER — Ambulatory Visit (INDEPENDENT_AMBULATORY_CARE_PROVIDER_SITE_OTHER): Payer: Medicare Other | Admitting: Vascular Surgery

## 2012-10-14 ENCOUNTER — Encounter: Payer: Self-pay | Admitting: Neurosurgery

## 2012-10-14 ENCOUNTER — Ambulatory Visit: Payer: Medicare Other | Admitting: Vascular Surgery

## 2012-10-14 VITALS — BP 123/74 | HR 59 | Resp 16 | Ht 60.0 in | Wt 167.8 lb

## 2012-10-14 DIAGNOSIS — I700 Atherosclerosis of aorta: Secondary | ICD-10-CM

## 2012-10-14 DIAGNOSIS — M79609 Pain in unspecified limb: Secondary | ICD-10-CM

## 2012-10-14 DIAGNOSIS — I739 Peripheral vascular disease, unspecified: Secondary | ICD-10-CM

## 2012-10-14 NOTE — Progress Notes (Signed)
[facility] [place] PAD/PVD Office Note  CC: PAD surveillance Referring Physician: Edilia Bo  History of Present Illness: 72 year old female patient of Dr. Edilia Bo with known atherosclerosis of the aorta and PAD. The patient states her legs feel "numb" but she does not describe claudication or rest pain. The patient has no ulcerations on her lower extremities. The patient does have some "pain with walking", however she describes this more as paresthesias and true claudication.  Past Medical History  Diagnosis Date  . Hypertension   . GERD (gastroesophageal reflux disease)   . Anxiety   . Hyperlipidemia   . History of GI bleed     With AVM  . Anemia     Iron deficiency anemia  . Shoulder impingement syndrome     Right shoulder  . Aortic stenosis   . CAD (coronary artery disease)   . Abdominal pain, unspecified site   . Myocardial infarction 2010  . Peripheral vascular disease     ROS: [x]  Positive   [ ]  Denies    General: [ ]  Weight loss, [ ]  Fever, [ ]  chills Neurologic: [ ]  Dizziness, [ ]  Blackouts, [ ]  Seizure [ ]  Stroke, [ ]  "Mini stroke", [ ]  Slurred speech, [ ]  Temporary blindness; [ ]  weakness in arms or legs, [ ]  Hoarseness Cardiac: [ ]  Chest pain/pressure, [ ]  Shortness of breath at rest [ ]  Shortness of breath with exertion, [ ]  Atrial fibrillation or irregular heartbeat Vascular: [ x] Pain in legs with walking, [ x] Pain in legs at rest, [ ]  Pain in legs at night,  [ ]  Non-healing ulcer, [ ]  Blood clot in vein/DVT,   Pulmonary: [ ]  Home oxygen, [ ]  Productive cough, [ ]  Coughing up blood, [ ]  Asthma,  [ ]  Wheezing Musculoskeletal:  [ ]  Arthritis, [ ]  Low back pain, [ ]  Joint pain Hematologic: [ ]  Easy Bruising, [ ]  Anemia; [ ]  Hepatitis Gastrointestinal: [ ]  Blood in stool, [ ]  Gastroesophageal Reflux/heartburn, [ ]  Trouble swallowing Urinary: [ ]  chronic Kidney disease, [ ]  on HD - [ ]  MWF or [ ]  TTHS, [ ]  Burning with urination, [ ]  Difficulty  urinating Skin: [ ]  Rashes, [ ]  Wounds Psychological: [ ]  Anxiety, [ ]  Depression   Social History History  Substance Use Topics  . Smoking status: Current Every Day Smoker -- 0.3 packs/day  . Smokeless tobacco: Never Used  . Alcohol Use: No    Family History Family History  Problem Relation Age of Onset  . Hypertension Mother     Allergies  Allergen Reactions  . Codeine Other (See Comments)    Feeling intoxicated    Current Outpatient Prescriptions  Medication Sig Dispense Refill  . AMLODIPINE BESYLATE PO Take 10 mg by mouth daily.      Marland Kitchen aspirin 81 MG tablet Take 81 mg by mouth daily.      Marland Kitchen atorvastatin (LIPITOR) 80 MG tablet Take 80 mg by mouth daily.      . Calcium Carbonate-Vitamin D (CALCIUM 600+D) 600-400 MG-UNIT per tablet Take 1 tablet by mouth daily.      . COD LIVER OIL W/VIT A & D PO Take by mouth.      . ferrous sulfate 325 (65 FE) MG tablet Take 325 mg by mouth daily with breakfast.      . glycopyrrolate (ROBINUL-FORTE) 2 MG tablet Take 2 mg by mouth 2 (two) times daily.      . Iron-Vitamins (GERITOL PO)  Take by mouth.      Marland Kitchen KLOR-CON 10 10 MEQ tablet       . lisinopril-hydrochlorothiazide (PRINZIDE,ZESTORETIC) 10-12.5 MG per tablet Take 1 tablet by mouth daily.      Marland Kitchen LORazepam (ATIVAN) 0.5 MG tablet Take 0.5 mg by mouth at bedtime as needed.      . nebivolol (BYSTOLIC) 5 MG tablet Take 5 mg by mouth daily.      Marland Kitchen omeprazole (PRILOSEC) 40 MG capsule Take 40 mg by mouth daily.      . potassium chloride (K-DUR,KLOR-CON) 10 MEQ tablet Take 10 mEq by mouth 2 (two) times daily.      . sertraline (ZOLOFT) 50 MG tablet Take 50 mg by mouth daily.      Marland Kitchen liver oil-zinc oxide (DESITIN) 40 % ointment Apply topically as needed.      . Prenatal Vit-Fe Fumarate-FA (M-VIT) tablet Take 1 tablet by mouth daily.      . vitamin E 400 UNIT capsule Take 400 Units by mouth daily.      . Vitamins A & D (VITAMIN A & D) 10000-400 UNITS CAPS Take by mouth.        Physical  Examination  Filed Vitals:   10/14/12 1103  BP: 123/74  Pulse: 59  Resp: 16    Body mass index is 32.77 kg/(m^2).  General:  WDWN in NAD Gait: Normal HEENT: WNL Eyes: Pupils equal Pulmonary: normal non-labored breathing , without Rales, rhonchi,  wheezing Cardiac: RRR, without  Murmurs, rubs or gallops; No carotid bruits Abdomen: soft, NT, no masses Skin: no rashes, ulcers noted Vascular Exam/Pulses: Palpable femoral pulses, lower show a pulses are very dampened  Extremities without ischemic changes, no Gangrene , no cellulitis; no open wounds;  Musculoskeletal: no muscle wasting or atrophy  Neurologic: A&O X 3; Appropriate Affect ; SENSATION: normal; MOTOR FUNCTION:  moving all extremities equally. Speech is fluent/normal  Non-Invasive Vascular Imaging: ABIs today are 1.06 and triphasic on the right, 0.97 and biphasic on the left  ASSESSMENT/PLAN: This is a patient describes paresthesias in her lower extremities and not true claudication. The patient will followup in one year with repeat ABIs, she is in agreement with this plan, her questions were encouraged and answered.  Lauree Chandler ANP  Clinic M.D.: Edilia Bo

## 2012-10-19 ENCOUNTER — Other Ambulatory Visit: Payer: Self-pay | Admitting: *Deleted

## 2012-10-19 DIAGNOSIS — I739 Peripheral vascular disease, unspecified: Secondary | ICD-10-CM

## 2013-06-19 ENCOUNTER — Encounter: Payer: Self-pay | Admitting: Cardiology

## 2013-06-19 DIAGNOSIS — E7800 Pure hypercholesterolemia, unspecified: Secondary | ICD-10-CM | POA: Insufficient documentation

## 2013-06-19 DIAGNOSIS — K219 Gastro-esophageal reflux disease without esophagitis: Secondary | ICD-10-CM | POA: Insufficient documentation

## 2013-06-19 DIAGNOSIS — F17200 Nicotine dependence, unspecified, uncomplicated: Secondary | ICD-10-CM

## 2013-06-19 DIAGNOSIS — D649 Anemia, unspecified: Secondary | ICD-10-CM | POA: Insufficient documentation

## 2013-06-19 DIAGNOSIS — F411 Generalized anxiety disorder: Secondary | ICD-10-CM | POA: Insufficient documentation

## 2013-06-19 DIAGNOSIS — R1013 Epigastric pain: Secondary | ICD-10-CM | POA: Insufficient documentation

## 2013-06-19 DIAGNOSIS — I359 Nonrheumatic aortic valve disorder, unspecified: Secondary | ICD-10-CM | POA: Insufficient documentation

## 2013-06-19 DIAGNOSIS — I2582 Chronic total occlusion of coronary artery: Secondary | ICD-10-CM | POA: Insufficient documentation

## 2013-06-19 DIAGNOSIS — F341 Dysthymic disorder: Secondary | ICD-10-CM

## 2013-06-19 DIAGNOSIS — R809 Proteinuria, unspecified: Secondary | ICD-10-CM

## 2013-06-19 DIAGNOSIS — I10 Essential (primary) hypertension: Secondary | ICD-10-CM

## 2013-06-19 DIAGNOSIS — Z8719 Personal history of other diseases of the digestive system: Secondary | ICD-10-CM

## 2013-06-19 DIAGNOSIS — D509 Iron deficiency anemia, unspecified: Secondary | ICD-10-CM

## 2013-06-19 DIAGNOSIS — M129 Arthropathy, unspecified: Secondary | ICD-10-CM | POA: Insufficient documentation

## 2013-06-19 DIAGNOSIS — E876 Hypokalemia: Secondary | ICD-10-CM

## 2013-06-19 DIAGNOSIS — Z954 Presence of other heart-valve replacement: Secondary | ICD-10-CM

## 2013-06-19 DIAGNOSIS — R141 Gas pain: Secondary | ICD-10-CM | POA: Insufficient documentation

## 2013-06-19 DIAGNOSIS — F329 Major depressive disorder, single episode, unspecified: Secondary | ICD-10-CM | POA: Insufficient documentation

## 2013-06-28 ENCOUNTER — Ambulatory Visit (INDEPENDENT_AMBULATORY_CARE_PROVIDER_SITE_OTHER): Payer: Commercial Managed Care - HMO | Admitting: Cardiology

## 2013-06-28 ENCOUNTER — Encounter: Payer: Self-pay | Admitting: Cardiology

## 2013-06-28 VITALS — BP 112/64 | HR 55 | Ht 60.0 in | Wt 161.0 lb

## 2013-06-28 DIAGNOSIS — I739 Peripheral vascular disease, unspecified: Secondary | ICD-10-CM

## 2013-06-28 DIAGNOSIS — I2582 Chronic total occlusion of coronary artery: Secondary | ICD-10-CM

## 2013-06-28 DIAGNOSIS — Z954 Presence of other heart-valve replacement: Secondary | ICD-10-CM

## 2013-06-28 DIAGNOSIS — I10 Essential (primary) hypertension: Secondary | ICD-10-CM

## 2013-06-28 DIAGNOSIS — I359 Nonrheumatic aortic valve disorder, unspecified: Secondary | ICD-10-CM

## 2013-06-28 DIAGNOSIS — F17200 Nicotine dependence, unspecified, uncomplicated: Secondary | ICD-10-CM

## 2013-06-28 DIAGNOSIS — E7800 Pure hypercholesterolemia, unspecified: Secondary | ICD-10-CM

## 2013-06-28 LAB — LIPID PANEL
Cholesterol: 117 mg/dL (ref 0–200)
HDL: 34.5 mg/dL — ABNORMAL LOW (ref >39.00)
LDL Cholesterol: 67 mg/dL (ref 0–99)
Total CHOL/HDL Ratio: 3

## 2013-06-28 NOTE — Patient Instructions (Addendum)
Your physician recommends that you have lab work today: Lipid  Your physician has requested that you have an echocardiogram. Echocardiography is a painless test that uses sound waves to create images of your heart. It provides your doctor with information about the size and shape of your heart and how well your heart's chambers and valves are working. This procedure takes approximately one hour. There are no restrictions for this procedure.  Your physician wants you to follow-up in: 6 months Dr. Anne Fu. You will receive a reminder letter in the mail two months in advance. If you don't receive a letter, please call our office to schedule the follow-up appointment.

## 2013-06-28 NOTE — Progress Notes (Signed)
[address], [address] [place], [place]  [address] Phone: [phone] Fax:  [phone]  Date:  06/28/2013   ID:  [name], DOB 1940/10/24, MRN [id number]  PCP:  Leanor Rubenstein, MD   History of Present Illness: [name] is a 72 y.o. female with coronary artery disease status post bypass x2-S/P G. 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  Echocardiogram on 06/11/12 demonstrated bioprosthetic aortic valve velocity of 3.0 m/s which is upper limits of normal as well as ascending root dilated aorta of 4.3 cm. Her overall ejection fraction was normal.  In regards to lipids, LDL has been under good control with atorvastatin. Prior lab work reviewed. Previous creatinine 0.6.  She has had some continued shortness of breath with activity as well as complaints of low back pain, left lateral leg pain and we continued to express the importance of exercise and conditioning. We have tried Bystolic instead of metoprolol because of previously described fatigue. No significant SOB.   Wt Readings from Last 3 Encounters:  06/28/13 161 lb (73.029 kg)  10/14/12 167 lb 12.8 oz (76.114 kg)  10/16/11 164 lb (74.39 kg)     Past Medical History  Diagnosis Date  . Hypertension   . GERD (gastroesophageal reflux disease)   . Anxiety   . Hyperlipidemia   . History of GI bleed     With AVM  . Anemia     Iron deficiency anemia  . Shoulder impingement syndrome     Right shoulder  . Aortic stenosis   . CAD (coronary artery disease)     psot bypass x2 to diagonal and obtuse marginal. SVG graft-01/08/2009  . Abdominal pain, unspecified site   . Myocardial infarction 2010  . Peripheral vascular disease   . Hypercholesterolemia   . Iron deficiency anemia     Past Surgical History  Procedure Laterality Date  . Aortic valve replacement      Edwards #21  .  Cholecystectomy    . Spine surgery  2003    Herniated diskectomy  . Wrist surgery      right wrist  . Heel spur excision  2000    left heel  . Coronary artery bypass graft    . Cardiac surgery  2010    , 2 blockages    Current Outpatient Prescriptions  Medication Sig Dispense Refill  . albuterol (PROVENTIL HFA;VENTOLIN HFA) 108 (90 BASE) MCG/ACT inhaler Inhale 2 puffs into the lungs every 4 (four) hours as needed for wheezing.      Marland Kitchen AMLODIPINE BESYLATE PO Take 10 mg by mouth daily.      Marland Kitchen aspirin 81 MG tablet Take 81 mg by mouth daily.      Marland Kitchen atorvastatin (LIPITOR) 80 MG tablet Take 80 mg by mouth daily.      . Calcium Carbonate-Vitamin D (CALCIUM 600+D) 600-400 MG-UNIT per tablet Take 1 tablet by mouth daily.      . COD LIVER OIL W/VIT A & D PO Take by mouth.      . ferrous sulfate 325 (65 FE) MG tablet Take 325 mg by mouth daily with breakfast.      . lisinopril-hydrochlorothiazide (PRINZIDE,ZESTORETIC) 10-12.5 MG per tablet Take 1 tablet by mouth daily.      Marland Kitchen liver oil-zinc oxide (DESITIN) 40 % ointment Apply topically as needed.      Marland Kitchen  LORazepam (ATIVAN) 0.5 MG tablet Take 0.5 mg by mouth at bedtime as needed.      . Multiple Vitamin (MULTIVITAMIN) tablet Take 1 tablet by mouth daily.      . nebivolol (BYSTOLIC) 5 MG tablet Take 5 mg by mouth daily.      Marland Kitchen omeprazole (PRILOSEC) 40 MG capsule Take 40 mg by mouth daily.      . potassium chloride (K-DUR,KLOR-CON) 10 MEQ tablet Take 10 mEq by mouth daily.       . sertraline (ZOLOFT) 50 MG tablet Take 100 mg by mouth daily.        No current facility-administered medications for this visit.    Allergies:    Allergies  Allergen Reactions  . Codeine Other (See Comments)    Feeling intoxicated    Social History:  The patient  reports that she has been smoking.  She has never used smokeless tobacco. She reports that she does not drink alcohol or use illicit drugs.   ROS:  Please see the history of present illness.   Denies any  syncope, bleeding, orthopnea, PND.  Positive for fatigue. No recent bleeding/melena.  PHYSICAL EXAM: VS:  BP 112/64  Pulse 55  Ht 5' (1.524 m)  Wt 161 lb (73.029 kg)  BMI 31.44 kg/m2 Well nourished, well developed, in no acute distress HEENT: normal Neck: no JVD Cardiac:  normal S1, S2; RRR; 2/6 systolic murmur Right upper sternal border. Scar noted. 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 No significant change from prior.      ASSESSMENT AND PLAN:  1. Coronary artery disease status post bypass-currently overall doing well with no exertional anginal symptoms. Continue with aspirin., Beta blocker, statin, ACE inhibitor. Previous bypass anatomy reviewed. 2. Aortic valve replacement-upper normal velocity on echocardiogram. Continue to monitor. Murmur appreciated on exam. 3. AVM-prior GI bleed. Once aortic valve replaced, no further bleeding episodes. This can be related to lack of degradation of von Willebrand's factor. 4. Hypertension-under reasonable control. No changes made. 5. Hyperlipidemia-prior lipid panel reviewed. Overall good control. I would like to check lipid profile. 6. Tobacco use-encourage tobacco cessation. 7. Dilated aortic root-we will continue to monitor with echocardiogram. She is due for echocardiogram.  Signed, Donato Schultz, MD [facility]  06/28/2013 10:29 AM

## 2013-07-14 ENCOUNTER — Ambulatory Visit (HOSPITAL_COMMUNITY): Payer: Medicare HMO | Attending: Cardiology | Admitting: Radiology

## 2013-07-14 DIAGNOSIS — I2510 Atherosclerotic heart disease of native coronary artery without angina pectoris: Secondary | ICD-10-CM | POA: Insufficient documentation

## 2013-07-14 DIAGNOSIS — F17200 Nicotine dependence, unspecified, uncomplicated: Secondary | ICD-10-CM | POA: Insufficient documentation

## 2013-07-14 DIAGNOSIS — I10 Essential (primary) hypertension: Secondary | ICD-10-CM | POA: Insufficient documentation

## 2013-07-14 DIAGNOSIS — I739 Peripheral vascular disease, unspecified: Secondary | ICD-10-CM | POA: Insufficient documentation

## 2013-07-14 DIAGNOSIS — E7800 Pure hypercholesterolemia, unspecified: Secondary | ICD-10-CM | POA: Insufficient documentation

## 2013-07-14 DIAGNOSIS — I359 Nonrheumatic aortic valve disorder, unspecified: Secondary | ICD-10-CM

## 2013-07-14 DIAGNOSIS — I519 Heart disease, unspecified: Secondary | ICD-10-CM | POA: Insufficient documentation

## 2013-07-14 NOTE — Progress Notes (Signed)
Echocardiogram performed.  

## 2013-08-02 NOTE — Progress Notes (Signed)
Left message for patient to call the office

## 2013-10-12 ENCOUNTER — Other Ambulatory Visit: Payer: Self-pay | Admitting: Cardiology

## 2013-10-20 ENCOUNTER — Encounter (HOSPITAL_COMMUNITY): Payer: Medicare HMO

## 2013-10-20 ENCOUNTER — Ambulatory Visit: Payer: Medicare Other | Admitting: Family

## 2013-10-20 ENCOUNTER — Ambulatory Visit: Payer: Medicare HMO | Admitting: Family

## 2013-10-21 ENCOUNTER — Other Ambulatory Visit: Payer: Self-pay

## 2013-10-21 MED ORDER — LISINOPRIL-HYDROCHLOROTHIAZIDE 10-12.5 MG PO TABS: 1.0000 | ORAL_TABLET | Freq: Every day | ORAL | Status: DC

## 2013-11-15 ENCOUNTER — Encounter (HOSPITAL_COMMUNITY): Payer: Medicare HMO

## 2013-11-15 ENCOUNTER — Ambulatory Visit: Payer: Medicare HMO | Admitting: Family

## 2013-11-22 ENCOUNTER — Other Ambulatory Visit (HOSPITAL_COMMUNITY): Payer: Self-pay | Admitting: Family Medicine

## 2013-11-22 DIAGNOSIS — Z1231 Encounter for screening mammogram for malignant neoplasm of breast: Secondary | ICD-10-CM

## 2013-11-25 ENCOUNTER — Ambulatory Visit (HOSPITAL_COMMUNITY)
Admission: RE | Admit: 2013-11-25 | Discharge: 2013-11-25 | Disposition: A | Discharge status: Home / Self Care | Payer: Medicare HMO | Source: Ambulatory Visit | Attending: Family Medicine | Admitting: Family Medicine

## 2013-11-25 DIAGNOSIS — Z1231 Encounter for screening mammogram for malignant neoplasm of breast: Secondary | ICD-10-CM | POA: Insufficient documentation

## 2013-11-29 ENCOUNTER — Other Ambulatory Visit: Payer: Self-pay | Admitting: Family Medicine

## 2013-11-29 DIAGNOSIS — R928 Other abnormal and inconclusive findings on diagnostic imaging of breast: Secondary | ICD-10-CM

## 2013-12-02 ENCOUNTER — Encounter: Payer: Self-pay | Admitting: Family

## 2013-12-03 ENCOUNTER — Encounter: Payer: Self-pay | Admitting: Family

## 2013-12-03 ENCOUNTER — Ambulatory Visit (HOSPITAL_COMMUNITY)
Admission: RE | Admit: 2013-12-03 | Discharge: 2013-12-03 | Disposition: A | Discharge status: Home / Self Care | Payer: Medicare HMO | Source: Ambulatory Visit | Attending: Family | Admitting: Family

## 2013-12-03 ENCOUNTER — Ambulatory Visit (INDEPENDENT_AMBULATORY_CARE_PROVIDER_SITE_OTHER): Payer: Commercial Managed Care - HMO | Admitting: Family

## 2013-12-03 VITALS — BP 102/73 | HR 80 | Resp 16 | Ht 60.0 in | Wt 161.0 lb

## 2013-12-03 DIAGNOSIS — I739 Peripheral vascular disease, unspecified: Secondary | ICD-10-CM

## 2013-12-03 DIAGNOSIS — M79609 Pain in unspecified limb: Secondary | ICD-10-CM

## 2013-12-03 NOTE — Progress Notes (Signed)
[facility] [place] HISTORY AND PHYSICAL -PAD  History of Present Illness [name] is a 73 y.o. female patient of Dr. Scot Dock with known atherosclerosis of the aorta and PAD. She returns today for ABI's and surveillance.  She reports tight feeling in both calves equally after walking about 100 feet, and sometimes at rest.  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  She denies non healing ulcers on legs/feet. She has left flank pain that starts in the back and radiates around her left flank to upper abdomen, has lots of gas, has loose stools; she states that she has discussed this with her PCP. Has loose stools after eating.  Pt has not had previous intervention for PAD.  The patient denies New Medical or Surgical History. She had a 2 vessel CABG about 7 years ago, denies history of MI. She denies known history of stroke or TIA.   Pt Diabetic: No Pt smoker: smoker  (1/3 ppd x since age 79 yrs)  Pt meds include: Statin :Yes Betablocker: Yes ASA: Yes Other anticoagulants/antiplatelets: no  Past Medical History  Diagnosis Date  . Hypertension   . GERD (gastroesophageal reflux disease)   . Anxiety   . Hyperlipidemia   . History of GI bleed     With AVM  . Anemia     Iron deficiency anemia  . Shoulder impingement syndrome     Right shoulder  . Aortic stenosis   . CAD (coronary artery disease)     psot bypass x2 to diagonal and obtuse marginal. SVG graft-01/08/2009  . Abdominal pain, unspecified site   . Myocardial infarction 2010  . Peripheral vascular disease   . Hypercholesterolemia   . Iron deficiency anemia     Social History History  Substance Use Topics  . Smoking status: Current Every Day Smoker -- 0.30 packs/day  . Smokeless tobacco: Never Used     Comment: trying to quit  . Alcohol Use: No    Family History Family History  Problem Relation Age of Onset  .  Hypertension Mother     Past Surgical History  Procedure Laterality Date  . Aortic valve replacement      Edwards #21  . Cholecystectomy    . Spine surgery  2003    Herniated diskectomy  . Wrist surgery      right wrist  . Heel spur excision  2000    left heel  . Coronary artery bypass graft    . Cardiac surgery  2010    , 2 blockages    Allergies  Allergen Reactions  . Codeine Other (See Comments)    Feeling intoxicated    Current Outpatient Prescriptions  Medication Sig Dispense Refill  . albuterol (PROVENTIL HFA;VENTOLIN HFA) 108 (90 BASE) MCG/ACT inhaler Inhale 2 puffs into the lungs every 4 (four) hours as needed for wheezing.      Marland Kitchen AMLODIPINE BESYLATE PO Take 10 mg by mouth daily.      Marland Kitchen aspirin 81 MG tablet Take 81 mg by mouth daily.      Marland Kitchen atorvastatin (LIPITOR) 80 MG tablet Take 80 mg by mouth daily.      . Calcium Carbonate-Vitamin D (CALCIUM 600+D) 600-400 MG-UNIT per tablet Take 1 tablet by mouth daily.      . COD LIVER OIL W/VIT A & D PO Take by mouth.      . ferrous sulfate 325 (65 FE)  MG tablet Take 325 mg by mouth daily with breakfast.      . lisinopril-hydrochlorothiazide (PRINZIDE,ZESTORETIC) 10-12.5 MG per tablet Take 1 tablet by mouth daily.  30 tablet  6  . LORazepam (ATIVAN) 0.5 MG tablet Take 0.5 mg by mouth at bedtime as needed.      . Multiple Vitamin (MULTIVITAMIN) tablet Take 1 tablet by mouth daily.      . nebivolol (BYSTOLIC) 5 MG tablet Take 5 mg by mouth daily.      Marland Kitchen omeprazole (PRILOSEC) 40 MG capsule Take 40 mg by mouth daily.      . potassium chloride (K-DUR,KLOR-CON) 10 MEQ tablet TAKE 1 TABLET BY MOUTH EVERY DAY  30 tablet  5  . sertraline (ZOLOFT) 50 MG tablet Take 100 mg by mouth daily.       Marland Kitchen liver oil-zinc oxide (DESITIN) 40 % ointment Apply topically as needed.       No current facility-administered medications for this visit.    ROS: See HPI for pertinent positives and negatives.   Physical Examination  Filed Vitals:    12/03/13 1022  BP: 102/73  Pulse: 80  Resp: 16   Filed Weights   12/03/13 1022  Weight: 161 lb (73.029 kg)   Body mass index is 31.44 kg/(m^2).  General: A&O x 3, WDWN, obese female. Gait: limp Eyes: PERRLA. Pulmonary: CTAB, without wheezes , rales or rhonchi. Cardiac: regular Rythm , without detected murmur.         Carotid Bruits Left Right   Negative Negative  Aorta is not palpable. Radial pulses: are 2+ palpable and =                           VASCULAR EXAM: Extremities without ischemic changes  without Gangrene; without open wounds.                                                                                                          LE Pulses LEFT RIGHT       FEMORAL  not palpable, possibly due to body habitus  not palpable, possibly due to body habitus        POPLITEAL  not palpable   not palpable       POSTERIOR TIBIAL  not palpable   not palpable        DORSALIS PEDIS      ANTERIOR TIBIAL not palpable  not palpable    Abdomen: soft, diffusely mildly tender, no masses, central obesity. Skin: no rashes, no ulcers noted. Musculoskeletal: no muscle wasting or atrophy.  Neurologic: A&O X 3; Appropriate Affect ; SENSATION: normal; MOTOR FUNCTION:  moving all extremities equally, motor strength 5/5 in arms, 4/5 in legs. Speech is fluent/normal. CN 2-12 intact.    Non-Invasive Vascular Imaging: DATE: 12/03/2013 ABI: RIGHT 0.98, Waveforms: triphasic PT, monophasic DP;  LEFT 0.80, Waveforms: triphasic PT, monophasic DP Previous (10/14/2012) ABI's: Right: 1.06, Left: 0.97   Outside Studies/Documentation  ASSESSMENT: [name] is a 73 y.o. female who presents with  known atherosclerosis of the aorta and PAD.  Her legs weakness is consistent with her lumbar HNP, and not with normal and mild arterial occlusive disease in her legs as evidenced by today's ABI's.  PLAN:  She was counseled re smoking cessation. Graduated walking program explained. I  discussed in depth with the patient the nature of atherosclerosis, and emphasized the importance of maximal medical management including strict control of blood pressure, blood glucose, and lipid levels, obtaining regular exercise, and cessation of smoking.  The patient is aware that without maximal medical management the underlying atherosclerotic disease process will progress, limiting the benefit of any interventions.  Based on the patient's vascular studies and examination, pt will return to clinic in 1 year for ABI's.  The patient was given information about PAD including signs, symptoms, treatment, what symptoms should prompt the patient to seek immediate medical care, and risk reduction measures to take.  Clemon Chambers, RN, MSN, FNP-C [facility] Phone: [phone]  Clinic MD: Bridgett Larsson  12/03/2013 10:19 AM

## 2013-12-03 NOTE — Patient Instructions (Addendum)
Peripheral Vascular Disease Peripheral Vascular Disease (PVD), also called Peripheral Arterial Disease (PAD), is a circulation problem caused by cholesterol (atherosclerotic plaque) deposits in the arteries. PVD commonly occurs in the lower extremities (legs) but it can occur in other areas of the body, such as your arms. The cholesterol buildup in the arteries reduces blood flow which can cause pain and other serious problems. The presence of PVD can place a person at risk for Coronary Artery Disease (CAD).  CAUSES  Causes of PVD can be many. It is usually associated with more than one risk factor such as:   High Cholesterol.  Smoking.  Diabetes.  Lack of exercise or inactivity.  High blood pressure (hypertension).  Obesity.  Family history. SYMPTOMS   When the lower extremities are affected, patients with PVD may experience:  Leg pain with exertion or physical activity. This is called INTERMITTENT CLAUDICATION. This may present as cramping or numbness with physical activity. The location of the pain is associated with the level of blockage. For example, blockage at the abdominal level (distal abdominal aorta) may result in buttock or hip pain. Lower leg arterial blockage may result in calf pain.  As PVD becomes more severe, pain can develop with less physical activity.  In people with severe PVD, leg pain may occur at rest.  Other PVD signs and symptoms:  Leg numbness or weakness.  Coldness in the affected leg or foot, especially when compared to the other leg.  A change in leg color.  Patients with significant PVD are more prone to ulcers or sores on toes, feet or legs. These may take longer to heal or may reoccur. The ulcers or sores can become infected.  If signs and symptoms of PVD are ignored, gangrene may occur. This can result in the loss of toes or loss of an entire limb.  Not all leg pain is related to PVD. Other medical conditions can cause leg pain such  as:  Blood clots (embolism) or Deep Vein Thrombosis.  Inflammation of the blood vessels (vasculitis).  Spinal stenosis. DIAGNOSIS  Diagnosis of PVD can involve several different types of tests. These can include:  Pulse Volume Recording Method (PVR). This test is simple, painless and does not involve the use of X-rays. PVR involves measuring and comparing the blood pressure in the arms and legs. An ABI (Ankle-Brachial Index) is calculated. The normal ratio of blood pressures is 1. As this number becomes smaller, it indicates more severe disease.  < 0.95  indicates significant narrowing in one or more leg vessels.  <0.8 there will usually be pain in the foot, leg or buttock with exercise.  <0.4 will usually have pain in the legs at rest.  <0.25  usually indicates limb threatening PVD.  Doppler detection of pulses in the legs. This test is painless and checks to see if you have a pulses in your legs/feet.  A dye or contrast material (a substance that highlights the blood vessels so they show up on x-ray) may be given to help your caregiver better see the arteries for the following tests. The dye is eliminated from your body by the kidney's. Your caregiver may order blood work to check your kidney function and other laboratory values before the following tests are performed:  Magnetic Resonance Angiography (MRA). An MRA is a picture study of the blood vessels and arteries. The MRA machine uses a large magnet to produce images of the blood vessels.  Computed Tomography Angiography (CTA). A CTA is a   specialized x-ray that looks at how the blood flows in your blood vessels. An IV may be inserted into your arm so contrast dye can be injected.  Angiogram. Is a procedure that uses x-rays to look at your blood vessels. This procedure is minimally invasive, meaning a small incision (cut) is made in your groin. A small tube (catheter) is then inserted into the artery of your groin. The catheter is  guided to the blood vessel or artery your caregiver wants to examine. Contrast dye is injected into the catheter. X-rays are then taken of the blood vessel or artery. After the images are obtained, the catheter is taken out. TREATMENT  Treatment of PVD involves many interventions which may include:  Lifestyle changes:  Quitting smoking.  Exercise.  Following a low fat, low cholesterol diet.  Control of diabetes.  Foot care is very important to the PVD patient. Good foot care can help prevent infection.  Medication:  Cholesterol-lowering medicine.  Blood pressure medicine.  Anti-platelet drugs.  Certain medicines may reduce symptoms of Intermittent Claudication.  Interventional/Surgical options:  Angioplasty. An Angioplasty is a procedure that inflates a balloon in the blocked artery. This opens the blocked artery to improve blood flow.  Stent Implant. A wire mesh tube (stent) is placed in the artery. The stent expands and stays in place, allowing the artery to remain open.  Peripheral Bypass Surgery. This is a surgical procedure that reroutes the blood around a blocked artery to help improve blood flow. This type of procedure may be performed if Angioplasty or stent implants are not an option. SEEK IMMEDIATE MEDICAL CARE IF:   You develop pain or numbness in your arms or legs.  Your arm or leg turns cold, becomes blue in color.  You develop redness, warmth, swelling and pain in your arms or legs. MAKE SURE YOU:   Understand these instructions.  Will watch your condition.  Will get help right away if you are not doing well or get worse. Document Released: 10/17/2004 Document Revised: 12/02/2011 Document Reviewed: 09/13/2008 ExitCare Patient Information 2014 [employer].   Smoking Cessation Quitting smoking is important to your health and has many advantages. However, it is not always easy to quit since nicotine is a very addictive drug. Often times, people try 3  times or more before being able to quit. This document explains the best ways for you to prepare to quit smoking. Quitting takes hard work and a lot of effort, but you can do it. ADVANTAGES OF QUITTING SMOKING  You will live longer, feel better, and live better.  Your body will feel the impact of quitting smoking almost immediately.  Within 20 minutes, blood pressure decreases. Your pulse returns to its normal level.  After 8 hours, carbon monoxide levels in the blood return to normal. Your oxygen level increases.  After 24 hours, the chance of having a heart attack starts to decrease. Your breath, hair, and body stop smelling like smoke.  After 48 hours, damaged nerve endings begin to recover. Your sense of taste and smell improve.  After 72 hours, the body is virtually free of nicotine. Your bronchial tubes relax and breathing becomes easier.  After 2 to 12 weeks, lungs can hold more air. Exercise becomes easier and circulation improves.  The risk of having a heart attack, stroke, cancer, or lung disease is greatly reduced.  After 1 year, the risk of coronary heart disease is cut in half.  After 5 years, the risk of stroke falls to   the same as a nonsmoker.  After 10 years, the risk of lung cancer is cut in half and the risk of other cancers decreases significantly.  After 15 years, the risk of coronary heart disease drops, usually to the level of a nonsmoker.  If you are pregnant, quitting smoking will improve your chances of having a healthy baby.  The people you live with, especially any children, will be healthier.  You will have extra money to spend on things other than cigarettes. QUESTIONS TO THINK ABOUT BEFORE ATTEMPTING TO QUIT You may want to talk about your answers with your caregiver.  Why do you want to quit?  If you tried to quit in the past, what helped and what did not?  What will be the most difficult situations for you after you quit? How will you plan to  handle them?  Who can help you through the tough times? Your family? Friends? A caregiver?  What pleasures do you get from smoking? What ways can you still get pleasure if you quit? Here are some questions to ask your caregiver:  How can you help me to be successful at quitting?  What medicine do you think would be best for me and how should I take it?  What should I do if I need more help?  What is smoking withdrawal like? How can I get information on withdrawal? GET READY  Set a quit date.  Change your environment by getting rid of all cigarettes, ashtrays, matches, and lighters in your home, car, or work. Do not let people smoke in your home.  Review your past attempts to quit. Think about what worked and what did not. GET SUPPORT AND ENCOURAGEMENT You have a better chance of being successful if you have help. You can get support in many ways.  Tell your family, friends, and co-workers that you are going to quit and need their support. Ask them not to smoke around you.  Get individual, group, or telephone counseling and support. Programs are available at local hospitals and health centers. Call your local health department for information about programs in your area.  Spiritual beliefs and practices may help some smokers quit.  Download a "quit meter" on your computer to keep track of quit statistics, such as how long you have gone without smoking, cigarettes not smoked, and money saved.  Get a self-help book about quitting smoking and staying off of tobacco. LEARN NEW SKILLS AND BEHAVIORS  Distract yourself from urges to smoke. Talk to someone, go for a walk, or occupy your time with a task.  Change your normal routine. Take a different route to work. Drink tea instead of coffee. Eat breakfast in a different place.  Reduce your stress. Take a hot bath, exercise, or read a book.  Plan something enjoyable to do every day. Reward yourself for not smoking.  Explore  interactive web-based programs that specialize in helping you quit. GET MEDICINE AND USE IT CORRECTLY Medicines can help you stop smoking and decrease the urge to smoke. Combining medicine with the above behavioral methods and support can greatly increase your chances of successfully quitting smoking.  Nicotine replacement therapy helps deliver nicotine to your body without the negative effects and risks of smoking. Nicotine replacement therapy includes nicotine gum, lozenges, inhalers, nasal sprays, and skin patches. Some may be available over-the-counter and others require a prescription.  Antidepressant medicine helps people abstain from smoking, but how this works is unknown. This medicine is available by prescription.    Nicotinic receptor partial agonist medicine simulates the effect of nicotine in your brain. This medicine is available by prescription. Ask your caregiver for advice about which medicines to use and how to use them based on your health history. Your caregiver will tell you what side effects to look out for if you choose to be on a medicine or therapy. Carefully read the information on the package. Do not use any other product containing nicotine while using a nicotine replacement product.  RELAPSE OR DIFFICULT SITUATIONS Most relapses occur within the first 3 months after quitting. Do not be discouraged if you start smoking again. Remember, most people try several times before finally quitting. You may have symptoms of withdrawal because your body is used to nicotine. You may crave cigarettes, be irritable, feel very hungry, cough often, get headaches, or have difficulty concentrating. The withdrawal symptoms are only temporary. They are strongest when you first quit, but they will go away within 10 14 days. To reduce the chances of relapse, try to:  Avoid drinking alcohol. Drinking lowers your chances of successfully quitting.  Reduce the amount of caffeine you consume. Once you  quit smoking, the amount of caffeine in your body increases and can give you symptoms, such as a rapid heartbeat, sweating, and anxiety.  Avoid smokers because they can make you want to smoke.  Do not let weight gain distract you. Many smokers will gain weight when they quit, usually less than 10 pounds. Eat a healthy diet and stay active. You can always lose the weight gained after you quit.  Find ways to improve your mood other than smoking. FOR MORE INFORMATION  www.smokefree.gov  Document Released: 09/03/2001 Document Revised: 03/10/2012 Document Reviewed: 12/19/2011 ExitCare Patient Information 2014 [employer].  

## 2013-12-06 NOTE — Addendum Note (Signed)
Addended by: Mena Goes on: 12/06/2013 04:27 PM   Modules accepted: Orders

## 2013-12-07 ENCOUNTER — Other Ambulatory Visit: Payer: Self-pay | Admitting: *Deleted

## 2013-12-07 MED ORDER — AMLODIPINE BESYLATE 10 MG PO TABS: 10.0000 mg | ORAL_TABLET | Freq: Every day | ORAL | Status: DC

## 2013-12-10 ENCOUNTER — Other Ambulatory Visit: Payer: Self-pay | Admitting: Family Medicine

## 2013-12-10 ENCOUNTER — Ambulatory Visit
Admission: RE | Admit: 2013-12-10 | Discharge: 2013-12-10 | Disposition: A | Discharge status: Home / Self Care | Payer: Medicare HMO | Source: Ambulatory Visit | Attending: Family Medicine | Admitting: Family Medicine

## 2013-12-10 ENCOUNTER — Ambulatory Visit
Admission: RE | Admit: 2013-12-10 | Discharge: 2013-12-10 | Disposition: A | Discharge status: Home / Self Care | Payer: Self-pay | Source: Ambulatory Visit | Attending: Family Medicine | Admitting: Family Medicine

## 2013-12-10 DIAGNOSIS — R928 Other abnormal and inconclusive findings on diagnostic imaging of breast: Secondary | ICD-10-CM

## 2013-12-20 ENCOUNTER — Ambulatory Visit
Admission: RE | Admit: 2013-12-20 | Discharge: 2013-12-20 | Disposition: A | Discharge status: Home / Self Care | Payer: Medicare HMO | Source: Ambulatory Visit | Attending: Family Medicine | Admitting: Family Medicine

## 2013-12-20 DIAGNOSIS — R928 Other abnormal and inconclusive findings on diagnostic imaging of breast: Secondary | ICD-10-CM

## 2014-01-03 ENCOUNTER — Ambulatory Visit (INDEPENDENT_AMBULATORY_CARE_PROVIDER_SITE_OTHER): Payer: Medicare HMO | Admitting: Cardiology

## 2014-01-03 ENCOUNTER — Encounter: Payer: Self-pay | Admitting: Cardiology

## 2014-01-03 VITALS — BP 140/68 | HR 72 | Ht 60.0 in | Wt 162.8 lb

## 2014-01-03 DIAGNOSIS — I359 Nonrheumatic aortic valve disorder, unspecified: Secondary | ICD-10-CM

## 2014-01-03 DIAGNOSIS — F17200 Nicotine dependence, unspecified, uncomplicated: Secondary | ICD-10-CM

## 2014-01-03 DIAGNOSIS — R002 Palpitations: Secondary | ICD-10-CM

## 2014-01-03 DIAGNOSIS — I10 Essential (primary) hypertension: Secondary | ICD-10-CM

## 2014-01-03 DIAGNOSIS — Z954 Presence of other heart-valve replacement: Secondary | ICD-10-CM

## 2014-01-03 NOTE — Progress Notes (Signed)
[facility]. [address] [place], [place]  [address] Phone: [phone] Fax:  [phone]  Date:  01/03/2014   ID:  [name], DOB September 02, 1941, MRN [id number]  PCP:  [name], MD   History of Present Illness: [name] is a 73 y.o. female with coronary artery disease status post bypass x2-S/P G. 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  Echocardiogram on 06/11/12 demonstrated bioprosthetic aortic valve velocity of 3.0 m/s which is upper limits of normal as well as ascending root dilated aorta of 4.3 cm. Her overall ejection fraction was normal.  In regards to lipids, LDL has been under good control with atorvastatin. Prior lab work reviewed. Previous creatinine 0.6.  She has had some continued shortness of breath with activity as well as complaints of low back pain, left lateral leg pain and we continued to express the importance of exercise and conditioning. We have tried Bystolic instead of metoprolol because of previously described fatigue.  She is having some palpitations sometimes at night when laying down lasting approximately 10 minutes duration. He sometimes can happen during walking as well. She feels short of breath with these in her heart feels as though it is racing.  Wt Readings from Last 3 Encounters:  01/03/14 162 lb 12.8 oz (73.846 kg)  12/03/13 161 lb (73.029 kg)  06/28/13 161 lb (73.029 kg)     Past Medical History  Diagnosis Date  . Hypertension   . GERD (gastroesophageal reflux disease)   . Anxiety   . Hyperlipidemia   . History of GI bleed     With AVM  . Anemia     Iron deficiency anemia  . Shoulder impingement syndrome     Right shoulder  . Aortic stenosis   . CAD (coronary artery disease)     psot bypass x2 to diagonal and obtuse marginal. SVG graft-01/08/2009  . Abdominal pain, unspecified site   .  Myocardial infarction 2010  . Peripheral vascular disease   . Hypercholesterolemia   . Iron deficiency anemia     Past Surgical History  Procedure Laterality Date  . Aortic valve replacement      Edwards #21  . Cholecystectomy    . Spine surgery  2003    Herniated diskectomy  . Wrist surgery      right wrist  . Heel spur excision  2000    left heel  . Coronary artery bypass graft    . Cardiac surgery  2010    , 2 blockages    Current Outpatient Prescriptions  Medication Sig Dispense Refill  . amLODipine (NORVASC) 10 MG tablet Take 1 tablet (10 mg total) by mouth daily.  90 tablet  0  . aspirin 81 MG tablet Take 81 mg by mouth daily.      Marland Kitchen atorvastatin (LIPITOR) 80 MG tablet Take 80 mg by mouth daily.      . Calcium Carbonate-Vitamin D (CALCIUM 600+D) 600-400 MG-UNIT per tablet Take 1 tablet by mouth daily.      . COD LIVER OIL W/VIT A & D PO Take by mouth.      . ferrous sulfate 325 (65 FE) MG tablet Take 325 mg by mouth daily with breakfast.      . lisinopril-hydrochlorothiazide (PRINZIDE,ZESTORETIC) 10-12.5 MG per tablet Take 1 tablet by mouth daily.  30 tablet  6  . liver oil-zinc  oxide (DESITIN) 40 % ointment Apply topically as needed.      Marland Kitchen LORazepam (ATIVAN) 0.5 MG tablet Take 0.5 mg by mouth at bedtime as needed.      . Multiple Vitamin (MULTIVITAMIN) tablet Take 1 tablet by mouth daily.      . nebivolol (BYSTOLIC) 5 MG tablet Take 5 mg by mouth daily.      Marland Kitchen omeprazole (PRILOSEC) 40 MG capsule Take 40 mg by mouth daily.      . potassium chloride (K-DUR,KLOR-CON) 10 MEQ tablet TAKE 1 TABLET BY MOUTH EVERY DAY  30 tablet  5  . sertraline (ZOLOFT) 50 MG tablet Take 100 mg by mouth daily.        No current facility-administered medications for this visit.    Allergies:    Allergies  Allergen Reactions  . Codeine Other (See Comments)    Feeling intoxicated    Social History:  The patient  reports that she has been smoking.  She has never used smokeless tobacco.  She reports that she does not drink alcohol or use illicit drugs.   ROS:  Please see the history of present illness.   Denies any syncope, bleeding, orthopnea, PND.  Positive for fatigue. No recent bleeding/melena.  PHYSICAL EXAM: VS:  BP 140/68  Pulse 72  Ht 5' (1.524 m)  Wt 162 lb 12.8 oz (73.846 kg)  BMI 31.79 kg/m2 Well nourished, well developed, in no acute distress HEENT: normal Neck: no JVD, radiation of aortic murmur Cardiac:  normal S1, S2; RRR; 2/6 systolic murmur Right upper sternal border. Scar noted. 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 No significant change from prior.  ECHO : 07/14/13 - - Left ventricle: There was mild focal basal hypertrophy of the septum. Systolic function was vigorous. The estimated ejection fraction was in the range of 65% to 70%. Wall motion was normal; there were no regional wall motion abnormalities. Doppler parameters are consistent with abnormal left ventricular relaxation (grade 1 diastolic dysfunction). - Aortic valve: A prosthesis was present and functioning normally. The prosthesis had a normal range of motion. The sewing ring appeared normal, had no rocking motion, and showed no evidence of dehiscence. Peak velocity: 338cm/s (S) , elevated for bioprosthetic valve. Prior echo 06/11/12 - Peak vel 3.58m/s. (Discussed with Dr. Pablo Ledger).  - Mitral valve: Calcified annulus.  LABS: 06/28/13 - LDL 67. ASSESSMENT AND PLAN:  1. Coronary artery disease status post bypass-currently overall doing well with no exertional anginal symptoms. Continue with aspirin., Beta blocker, statin, ACE inhibitor. Previous bypass anatomy reviewed. 2. Palpitations-mostly at night when lying  down, lasting 10 minutes duration, can feel shortness of breath at times. I will check a 30 day event monitor. 3. Aortic valve replacement-upper normal velocity on echocardiogram. Continue to monitor. Murmur appreciated on exam. Discussed with Dr. [name]. 4. AVM-prior GI bleed. Once aortic valve replaced, no further bleeding episodes. This can be related to lack of degradation of von Willebrand's factor. 5. Hypertension-under reasonable control. No changes made. 6. Hyperlipidemia-prior lipid panel reviewed. Overall good control. 7. Tobacco use-encourage tobacco cessation. 8. Dilated aortic root-we will continue to monitor with echocardiogram.  9. 6 month follow  Signed, [name], MD [facility]  01/03/2014 10:36 AM

## 2014-01-03 NOTE — Patient Instructions (Addendum)
Your physician recommends that you continue on your current medications as directed. Please refer to the Current Medication list given to you today.  Your physician has recommended that you wear an 30 day event monitor. Event monitors are medical devices that record the heart's electrical activity. Doctors most often [place] these monitors to diagnose arrhythmias. Arrhythmias are problems with the speed or rhythm of the heartbeat. The monitor is a small, portable device. You can wear one while you do your normal daily activities. This is usually used to diagnose what is causing palpitations/syncope (passing out).  Your physician wants you to follow-up in: 6 months with Dr. [name]. You will receive a reminder letter in the mail two months in advance. If you don't receive a letter, please call our office to schedule the follow-up appointment.

## 2014-01-05 ENCOUNTER — Encounter (INDEPENDENT_AMBULATORY_CARE_PROVIDER_SITE_OTHER): Payer: Commercial Managed Care - HMO

## 2014-01-05 ENCOUNTER — Encounter: Payer: Self-pay | Admitting: *Deleted

## 2014-01-05 DIAGNOSIS — R002 Palpitations: Secondary | ICD-10-CM

## 2014-01-05 NOTE — Progress Notes (Signed)
Patient ID: [name], female   DOB: 1941-02-18, 74 y.o.   MRN: [id number] [employer] 30 day cardiac event monitor applied to patient.

## 2014-01-07 ENCOUNTER — Encounter: Payer: Self-pay | Admitting: Cardiology

## 2014-01-21 ENCOUNTER — Other Ambulatory Visit: Payer: Self-pay

## 2014-01-21 MED ORDER — NEBIVOLOL HCL 5 MG PO TABS: 5.0000 mg | ORAL_TABLET | Freq: Every day | ORAL | Status: DC

## 2014-01-21 MED ORDER — ATORVASTATIN CALCIUM 80 MG PO TABS: 80.0000 mg | ORAL_TABLET | Freq: Every day | ORAL | Status: DC

## 2014-01-21 MED ORDER — LISINOPRIL-HYDROCHLOROTHIAZIDE 10-12.5 MG PO TABS: 1.0000 | ORAL_TABLET | Freq: Every day | ORAL | Status: DC

## 2014-01-21 MED ORDER — POTASSIUM CHLORIDE CRYS ER 10 MEQ PO TBCR: EXTENDED_RELEASE_TABLET | ORAL | Status: DC

## 2014-01-21 MED ORDER — NEBIVOLOL HCL 5 MG PO TABS
5.0000 mg | ORAL_TABLET | Freq: Every day | ORAL | Status: DC
Start: 2014-01-21 — End: 2014-07-27

## 2014-01-21 MED ORDER — AMLODIPINE BESYLATE 10 MG PO TABS: 10.0000 mg | ORAL_TABLET | Freq: Every day | ORAL | Status: DC

## 2014-01-31 ENCOUNTER — Ambulatory Visit (INDEPENDENT_AMBULATORY_CARE_PROVIDER_SITE_OTHER): Payer: Commercial Managed Care - HMO | Admitting: *Deleted

## 2014-01-31 DIAGNOSIS — E7800 Pure hypercholesterolemia, unspecified: Secondary | ICD-10-CM

## 2014-01-31 DIAGNOSIS — I700 Atherosclerosis of aorta: Secondary | ICD-10-CM

## 2014-01-31 LAB — LIPID PANEL
Cholesterol: 124 mg/dL (ref 0–200)
HDL: 36.1 mg/dL — ABNORMAL LOW (ref >39.00)
LDL Cholesterol: 73 mg/dL (ref 0–99)
TRIGLYCERIDES: 74 mg/dL (ref 0.0–149.0)
Total CHOL/HDL Ratio: 3
VLDL: 14.8 mg/dL (ref 0.0–40.0)

## 2014-01-31 LAB — HEPATIC FUNCTION PANEL
ALBUMIN: 3.8 g/dL (ref 3.5–5.2)
ALK PHOS: 80 U/L (ref 39–117)
ALT: 15 U/L (ref 0–35)
AST: 20 U/L (ref 0–37)
Bilirubin, Direct: 0 mg/dL (ref 0.0–0.3)
Total Bilirubin: 0.5 mg/dL (ref 0.2–1.2)
Total Protein: 7.6 g/dL (ref 6.0–8.3)

## 2014-02-21 ENCOUNTER — Telehealth: Payer: Self-pay | Admitting: *Deleted

## 2014-02-21 NOTE — Telephone Encounter (Signed)
Monitor reviewed by dr [name] shows no adverse arrhythmias. No sign bradycardia,rare PAC, no medication needed.  pt aware of results

## 2014-04-02 ENCOUNTER — Other Ambulatory Visit: Payer: Self-pay | Admitting: Cardiology

## 2014-06-13 ENCOUNTER — Telehealth: Payer: Self-pay

## 2014-06-13 NOTE — Telephone Encounter (Signed)
Discussed pt's symptoms with S. Nickel, NP.  Reviewed pt's office note of 12/03/13.  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  Recommended pt. contact her Neurologist or PCP to report her current symptoms, due to the hx of HNP, and of previous lumbar spine surgeries.  Attempted to contact pt.  Left voice message to call office re: nurse practitioners recommendations.

## 2014-06-13 NOTE — Telephone Encounter (Signed)
Pt. called to report having "constant pain in right leg from knee down to foot x 1 week"; described a sensation like "pins sticking in the side of my right leg."  Also reported she has burning sensation in right foot.  Stated there are 2 hard knots on side of right foot.  Also, stated there is swelling in the right foot up to the ankle.  Denies any open sores in the right lower extremity.  Last ABI's done 3/13; ABI on Right 0.98; Left 0.80.  Advised pt. Will discuss with nurse practitioner.

## 2014-06-13 NOTE — Telephone Encounter (Signed)
Rec'd return phone call from pt.  Advised of recommendation by S. Nickel, NP, to schedule evaluation by Neurologist or Orthopaedist for current symptoms.  If the evaluation of lumbar spine proves negative, then have pt. Call back for evaluation of PVD.  Pt. Verb. Understanding.

## 2014-07-06 ENCOUNTER — Other Ambulatory Visit: Payer: Self-pay | Admitting: Family Medicine

## 2014-07-06 DIAGNOSIS — R921 Mammographic calcification found on diagnostic imaging of breast: Secondary | ICD-10-CM

## 2014-07-08 ENCOUNTER — Other Ambulatory Visit: Payer: Self-pay

## 2014-07-14 ENCOUNTER — Ambulatory Visit (INDEPENDENT_AMBULATORY_CARE_PROVIDER_SITE_OTHER): Payer: Commercial Managed Care - HMO | Admitting: Cardiology

## 2014-07-14 ENCOUNTER — Encounter: Payer: Self-pay | Admitting: Cardiology

## 2014-07-14 VITALS — BP 136/66 | HR 64 | Ht 60.0 in | Wt 163.0 lb

## 2014-07-14 DIAGNOSIS — I77810 Thoracic aortic ectasia: Secondary | ICD-10-CM | POA: Insufficient documentation

## 2014-07-14 DIAGNOSIS — E7800 Pure hypercholesterolemia, unspecified: Secondary | ICD-10-CM

## 2014-07-14 DIAGNOSIS — E669 Obesity, unspecified: Secondary | ICD-10-CM

## 2014-07-14 DIAGNOSIS — I25810 Atherosclerosis of coronary artery bypass graft(s) without angina pectoris: Secondary | ICD-10-CM

## 2014-07-14 DIAGNOSIS — R002 Palpitations: Secondary | ICD-10-CM

## 2014-07-14 DIAGNOSIS — I700 Atherosclerosis of aorta: Secondary | ICD-10-CM

## 2014-07-14 DIAGNOSIS — Z954 Presence of other heart-valve replacement: Secondary | ICD-10-CM

## 2014-07-14 DIAGNOSIS — Z952 Presence of prosthetic heart valve: Secondary | ICD-10-CM

## 2014-07-14 DIAGNOSIS — I10 Essential (primary) hypertension: Secondary | ICD-10-CM

## 2014-07-14 DIAGNOSIS — Z720 Tobacco use: Secondary | ICD-10-CM | POA: Insufficient documentation

## 2014-07-14 NOTE — Patient Instructions (Signed)
The current medical regimen is effective;  continue present plan and medications.  Follow up in 6 months with Dr. [name].  You will receive a letter in the mail 2 months before you are due.  Please call us when you receive this letter to schedule your follow up appointment.

## 2014-07-14 NOTE — Progress Notes (Signed)
[facility]. [address], [address] [place], [place]  [address] Phone: [phone] Fax:  [phone]  Date:  07/14/2014   ID:  [name], DOB [birth year], MRN [id number]  PCP:  [name], MD   History of Present Illness: [name] is a 73 y.o. female with coronary artery disease status post bypass x2-S/P G. 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  Echocardiogram on 06/11/12 demonstrated bioprosthetic aortic valve velocity of 3.0 m/s which is upper limits of normal as well as ascending root dilated aorta of 4.3 cm. Her overall ejection fraction was normal.  In regards to lipids, LDL has been under good control with atorvastatin. Prior lab work reviewed. Previous creatinine 0.6.  She has had continued somatic complaints, aches and pains she states. No exertional anginal symptoms. Left lateral leg pain and we continued to express the importance of exercise and conditioning. We have tried Bystolic instead of metoprolol because of previously described fatigue.  She is having some palpitations sometimes at night when laying down lasting approximately 10 minutes duration. He sometimes can happen during walking as well. She feels short of breath with these in her heart feels as though it is racing. Holter monitor in May of 2015 demonstrated no significant bradycardia, rare PACs. No new medications needed.    Wt Readings from Last 3 Encounters:  07/14/14 163 lb (73.936 kg)  01/03/14 162 lb 12.8 oz (73.846 kg)  12/03/13 161 lb (73.029 kg)     Past Medical History  Diagnosis Date  . Hypertension   . GERD (gastroesophageal reflux disease)   . Anxiety   . Hyperlipidemia   . History of GI bleed     With AVM  . Anemia     Iron deficiency anemia  . Shoulder impingement syndrome     Right shoulder  . Aortic stenosis   . CAD (coronary artery disease)     psot  bypass x2 to diagonal and obtuse marginal. SVG graft-01/08/2009  . Abdominal pain, unspecified site   . Myocardial infarction 2010  . Peripheral vascular disease   . Hypercholesterolemia   . Iron deficiency anemia     Past Surgical History  Procedure Laterality Date  . Aortic valve replacement      Edwards #21  . Cholecystectomy    . Spine surgery  2003    Herniated diskectomy  . Wrist surgery      right wrist  . Heel spur excision  2000    left heel  . Coronary artery bypass graft    . Cardiac surgery  2010    , 2 blockages    Current Outpatient Prescriptions  Medication Sig Dispense Refill  . amLODipine (NORVASC) 10 MG tablet Take 1 tablet (10 mg total) by mouth daily.  90 tablet  2  . aspirin 81 MG tablet Take 81 mg by mouth daily.      Marland Kitchen atorvastatin (LIPITOR) 80 MG tablet Take 1 tablet (80 mg total) by mouth daily.  90 tablet  2  . Calcium Carbonate-Vitamin D (CALCIUM 600+D) 600-400 MG-UNIT per tablet Take 1 tablet by mouth daily.      . COD LIVER OIL W/VIT A & D PO Take by mouth.      . ferrous sulfate 325 (65 FE) MG tablet Take 325 mg by mouth daily with breakfast.      . lisinopril-hydrochlorothiazide (PRINZIDE,ZESTORETIC)  10-12.5 MG per tablet Take 1 tablet by mouth daily.  90 tablet  1  . liver oil-zinc oxide (DESITIN) 40 % ointment Apply topically as needed.      Marland Kitchen LORazepam (ATIVAN) 0.5 MG tablet Take 0.5 mg by mouth at bedtime as needed.      . Multiple Vitamin (MULTIVITAMIN) tablet Take 1 tablet by mouth daily.      . nebivolol (BYSTOLIC) 5 MG tablet Take 1 tablet (5 mg total) by mouth daily.  90 tablet  1  . omeprazole (PRILOSEC) 40 MG capsule Take 40 mg by mouth daily.      . potassium chloride (K-DUR,KLOR-CON) 10 MEQ tablet TAKE 1 TABLET BY MOUTH EVERY DAY  90 tablet  2  . sertraline (ZOLOFT) 50 MG tablet Take 100 mg by mouth daily.        No current facility-administered medications for this visit.    Allergies:    Allergies  Allergen Reactions  .  Codeine Other (See Comments)    Feeling intoxicated    Social History:  The patient  reports that she has been smoking.  She has never used smokeless tobacco. She reports that she does not drink alcohol or use illicit drugs.   ROS:  Please see the history of present illness.   Denies any syncope, bleeding, orthopnea, PND.  Positive for fatigue. No recent bleeding/melena.  PHYSICAL EXAM: VS:  BP 136/66  Pulse 64  Ht 5' (1.524 m)  Wt 163 lb (73.936 kg)  BMI 31.83 kg/m2 Well nourished, well developed, in no acute distress HEENT: normal Neck: no JVD, radiation of aortic murmur Cardiac:  normal S1, S2; RRR; 2/6 systolic murmur Right upper sternal border. Scar noted. Lungs:  clear to auscultation bilaterally, no wheezing, rhonchi or rales Abd: soft, nontender, no hepatomegaly Ext: no edema Skin: warm and dry Neuro: no focal abnormalities noted  EKG: 07/14/14-sinus rhythm, minimal sinus arrhythmia, no other abnormalities. 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 No significant change from prior.  ECHO : 07/14/13 - - Left ventricle: There was mild focal basal hypertrophy of the septum. Systolic function was vigorous. The estimated ejection fraction was in the range of 65% to 70%. Wall motion was normal; there were no regional wall motion abnormalities. Doppler parameters are consistent with abnormal left ventricular relaxation (grade 1 diastolic dysfunction). - Aortic valve: A prosthesis was present and functioning normally. The prosthesis had a normal range of motion. The sewing ring appeared normal, had no rocking motion, and showed no evidence of dehiscence. Peak velocity: 338cm/s (S) , elevated for bioprosthetic valve. Prior echo 06/11/12 - Peak vel 3.56m/s. (Discussed with Dr. Pablo Ledger).  - Mitral valve: Calcified annulus.  LABS: 06/28/13 - LDL 67. ASSESSMENT AND  PLAN:  1. Coronary artery disease status post bypass-currently overall doing well with no exertional anginal symptoms. Continue with aspirin., Beta blocker, statin, ACE inhibitor. Previous bypass anatomy reviewed. 2. Palpitations-reassuring monitor, no current complaint, continue with beta blocker. 3. Aortic valve replacement-upper normal velocity on echocardiogram. Continue to monitor. Murmur appreciated on exam. Discussed with Dr. [name]. 4. AVM-prior GI bleed. Once aortic valve replaced, no further bleeding episodes. This can be related to lack of degradation of von Willebrand's factor. She understands to take dental prophylaxis antibiotics. 5. Hypertension-under reasonable control. No changes made. 6. Hyperlipidemia-prior lipid panel reviewed. Overall good control. 7. Tobacco use-encourage tobacco cessation. 8. Obesity-encourage weight loss. Discussed BMI with her. Decreasing carbohydrates. Diet.  Hard for her to exercise. 9. Dilated aortic root-we will continue to monitor with echocardiogram.  10. 6 month follow  Signed, [name], MD [facility]  07/14/2014 9:46 AM

## 2014-07-27 ENCOUNTER — Other Ambulatory Visit: Payer: Self-pay | Admitting: *Deleted

## 2014-07-27 MED ORDER — NEBIVOLOL HCL 5 MG PO TABS: 5.0000 mg | ORAL_TABLET | Freq: Every day | ORAL | Status: DC

## 2014-07-27 MED ORDER — LISINOPRIL-HYDROCHLOROTHIAZIDE 10-12.5 MG PO TABS: 1.0000 | ORAL_TABLET | Freq: Every day | ORAL | Status: DC

## 2014-08-01 ENCOUNTER — Ambulatory Visit
Admission: RE | Admit: 2014-08-01 | Discharge: 2014-08-01 | Disposition: A | Discharge status: Home / Self Care | Payer: Commercial Managed Care - HMO | Source: Ambulatory Visit | Attending: Family Medicine | Admitting: Family Medicine

## 2014-08-01 DIAGNOSIS — R921 Mammographic calcification found on diagnostic imaging of breast: Secondary | ICD-10-CM

## 2014-10-24 ENCOUNTER — Other Ambulatory Visit: Payer: Self-pay

## 2014-10-24 DIAGNOSIS — Z1231 Encounter for screening mammogram for malignant neoplasm of breast: Secondary | ICD-10-CM

## 2014-11-28 ENCOUNTER — Ambulatory Visit: Payer: Medicare HMO

## 2014-11-28 ENCOUNTER — Ambulatory Visit
Admission: RE | Admit: 2014-11-28 | Discharge: 2014-11-28 | Disposition: A | Discharge status: Home / Self Care | Payer: Medicare Other | Source: Ambulatory Visit

## 2014-11-28 DIAGNOSIS — Z1231 Encounter for screening mammogram for malignant neoplasm of breast: Secondary | ICD-10-CM

## 2014-12-08 ENCOUNTER — Encounter: Payer: Self-pay | Admitting: Family

## 2014-12-09 ENCOUNTER — Ambulatory Visit (HOSPITAL_COMMUNITY)
Admission: RE | Admit: 2014-12-09 | Discharge: 2014-12-09 | Disposition: A | Discharge status: Home / Self Care | Payer: Medicare Other | Source: Ambulatory Visit | Attending: Family | Admitting: Family

## 2014-12-09 ENCOUNTER — Other Ambulatory Visit: Payer: Self-pay | Admitting: Family

## 2014-12-09 ENCOUNTER — Encounter: Payer: Self-pay | Admitting: Family

## 2014-12-09 ENCOUNTER — Ambulatory Visit (INDEPENDENT_AMBULATORY_CARE_PROVIDER_SITE_OTHER): Payer: Medicare Other | Admitting: Family

## 2014-12-09 VITALS — BP 106/80 | HR 60 | Resp 16 | Ht 60.5 in | Wt 159.0 lb

## 2014-12-09 DIAGNOSIS — I739 Peripheral vascular disease, unspecified: Secondary | ICD-10-CM

## 2014-12-09 DIAGNOSIS — I10 Essential (primary) hypertension: Secondary | ICD-10-CM | POA: Diagnosis not present

## 2014-12-09 DIAGNOSIS — Z720 Tobacco use: Secondary | ICD-10-CM

## 2014-12-09 DIAGNOSIS — R200 Anesthesia of skin: Secondary | ICD-10-CM | POA: Insufficient documentation

## 2014-12-09 DIAGNOSIS — I700 Atherosclerosis of aorta: Secondary | ICD-10-CM

## 2014-12-09 DIAGNOSIS — R208 Other disturbances of skin sensation: Secondary | ICD-10-CM

## 2014-12-09 DIAGNOSIS — M25569 Pain in unspecified knee: Secondary | ICD-10-CM | POA: Diagnosis not present

## 2014-12-09 DIAGNOSIS — R05 Cough: Secondary | ICD-10-CM

## 2014-12-09 DIAGNOSIS — E785 Hyperlipidemia, unspecified: Secondary | ICD-10-CM | POA: Insufficient documentation

## 2014-12-09 DIAGNOSIS — F17200 Nicotine dependence, unspecified, uncomplicated: Secondary | ICD-10-CM

## 2014-12-09 DIAGNOSIS — R0689 Other abnormalities of breathing: Secondary | ICD-10-CM

## 2014-12-09 DIAGNOSIS — R0600 Dyspnea, unspecified: Secondary | ICD-10-CM

## 2014-12-09 DIAGNOSIS — R059 Cough, unspecified: Secondary | ICD-10-CM

## 2014-12-09 NOTE — Patient Instructions (Signed)
Peripheral Vascular Disease Peripheral Vascular Disease (PVD), also called Peripheral Arterial Disease (PAD), is a circulation problem caused by cholesterol (atherosclerotic plaque) deposits in the arteries. PVD commonly occurs in the lower extremities (legs) but it can occur in other areas of the body, such as your arms. The cholesterol buildup in the arteries reduces blood flow which can cause pain and other serious problems. The presence of PVD can place a person at risk for Coronary Artery Disease (CAD).  CAUSES  Causes of PVD can be many. It is usually associated with more than one risk factor such as:   High Cholesterol.  Smoking.  Diabetes.  Lack of exercise or inactivity.  High blood pressure (hypertension).  Obesity.  Family history. SYMPTOMS   When the lower extremities are affected, patients with PVD may experience:  Leg pain with exertion or physical activity. This is called INTERMITTENT CLAUDICATION. This may present as cramping or numbness with physical activity. The location of the pain is associated with the level of blockage. For example, blockage at the abdominal level (distal abdominal aorta) may result in buttock or hip pain. Lower leg arterial blockage may result in calf pain.  As PVD becomes more severe, pain can develop with less physical activity.  In people with severe PVD, leg pain may occur at rest.  Other PVD signs and symptoms:  Leg numbness or weakness.  Coldness in the affected leg or foot, especially when compared to the other leg.  A change in leg color.  Patients with significant PVD are more prone to ulcers or sores on toes, feet or legs. These may take longer to heal or may reoccur. The ulcers or sores can become infected.  If signs and symptoms of PVD are ignored, gangrene may occur. This can result in the loss of toes or loss of an entire limb.  Not all leg pain is related to PVD. Other medical conditions can cause leg pain such  as:  Blood clots (embolism) or Deep Vein Thrombosis.  Inflammation of the blood vessels (vasculitis).  Spinal stenosis. DIAGNOSIS  Diagnosis of PVD can involve several different types of tests. These can include:  Pulse Volume Recording Method (PVR). This test is simple, painless and does not involve the use of X-rays. PVR involves measuring and comparing the blood pressure in the arms and legs. An ABI (Ankle-Brachial Index) is calculated. The normal ratio of blood pressures is 1. As this number becomes smaller, it indicates more severe disease.  < 0.95 - indicates significant narrowing in one or more leg vessels.  <0.8 - there will usually be pain in the foot, leg or buttock with exercise.  <0.4 - will usually have pain in the legs at rest.  <0.25 - usually indicates limb threatening PVD.  Doppler detection of pulses in the legs. This test is painless and checks to see if you have a pulses in your legs/feet.  A dye or contrast material (a substance that highlights the blood vessels so they show up on x-ray) may be given to help your caregiver better see the arteries for the following tests. The dye is eliminated from your body by the kidney's. Your caregiver may order blood work to check your kidney function and other laboratory values before the following tests are performed:  Magnetic Resonance Angiography (MRA). An MRA is a picture study of the blood vessels and arteries. The MRA machine uses a large magnet to produce images of the blood vessels.  Computed Tomography Angiography (CTA). A CTA   is a specialized x-ray that looks at how the blood flows in your blood vessels. An IV may be inserted into your arm so contrast dye can be injected.  Angiogram. Is a procedure that uses x-rays to look at your blood vessels. This procedure is minimally invasive, meaning a small incision (cut) is made in your groin. A small tube (catheter) is then inserted into the artery of your groin. The catheter  is guided to the blood vessel or artery your caregiver wants to examine. Contrast dye is injected into the catheter. X-rays are then taken of the blood vessel or artery. After the images are obtained, the catheter is taken out. TREATMENT  Treatment of PVD involves many interventions which may include:  Lifestyle changes:  Quitting smoking.  Exercise.  Following a low fat, low cholesterol diet.  Control of diabetes.  Foot care is very important to the PVD patient. Good foot care can help prevent infection.  Medication:  Cholesterol-lowering medicine.  Blood pressure medicine.  Anti-platelet drugs.  Certain medicines may reduce symptoms of Intermittent Claudication.  Interventional/Surgical options:  Angioplasty. An Angioplasty is a procedure that inflates a balloon in the blocked artery. This opens the blocked artery to improve blood flow.  Stent Implant. A wire mesh tube (stent) is placed in the artery. The stent expands and stays in place, allowing the artery to remain open.  Peripheral Bypass Surgery. This is a surgical procedure that reroutes the blood around a blocked artery to help improve blood flow. This type of procedure may be performed if Angioplasty or stent implants are not an option. SEEK IMMEDIATE MEDICAL CARE IF:   You develop pain or numbness in your arms or legs.  Your arm or leg turns cold, becomes blue in color.  You develop redness, warmth, swelling and pain in your arms or legs. MAKE SURE YOU:   Understand these instructions.  Will watch your condition.  Will get help right away if you are not doing well or get worse. Document Released: 10/17/2004 Document Revised: 12/02/2011 Document Reviewed: 09/13/2008 Exit[employer] Patient Information 2015 [employer]. This information is not intended to replace advice given to you by your health care provider. Make sure you discuss any questions you have with your health care provider.    Smoking  Cessation Quitting smoking is important to your health and has many advantages. However, it is not always easy to quit since nicotine is a very addictive drug. Oftentimes, people try 3 times or more before being able to quit. This document explains the best ways for you to prepare to quit smoking. Quitting takes hard work and a lot of effort, but you can do it. ADVANTAGES OF QUITTING SMOKING  You will live longer, feel better, and live better.  Your body will feel the impact of quitting smoking almost immediately.  Within 20 minutes, blood pressure decreases. Your pulse returns to its normal level.  After 8 hours, carbon monoxide levels in the blood return to normal. Your oxygen level increases.  After 24 hours, the chance of having a heart attack starts to decrease. Your breath, hair, and body stop smelling like smoke.  After 48 hours, damaged nerve endings begin to recover. Your sense of taste and smell improve.  After 72 hours, the body is virtually free of nicotine. Your bronchial tubes relax and breathing becomes easier.  After 2 to 12 weeks, lungs can hold more air. Exercise becomes easier and circulation improves.  The risk of having a heart attack, stroke,   cancer, or lung disease is greatly reduced.  After 1 year, the risk of coronary heart disease is cut in half.  After 5 years, the risk of stroke falls to the same as a nonsmoker.  After 10 years, the risk of lung cancer is cut in half and the risk of other cancers decreases significantly.  After 15 years, the risk of coronary heart disease drops, usually to the level of a nonsmoker.  If you are pregnant, quitting smoking will improve your chances of having a healthy baby.  The people you live with, especially any children, will be healthier.  You will have extra money to spend on things other than cigarettes. QUESTIONS TO THINK ABOUT BEFORE ATTEMPTING TO QUIT You may want to talk about your answers with your health care  provider.  Why do you want to quit?  If you tried to quit in the past, what helped and what did not?  What will be the most difficult situations for you after you quit? How will you plan to handle them?  Who can help you through the tough times? Your family? Friends? A health care provider?  What pleasures do you get from smoking? What ways can you still get pleasure if you quit? Here are some questions to ask your health care provider:  How can you help me to be successful at quitting?  What medicine do you think would be best for me and how should I take it?  What should I do if I need more help?  What is smoking withdrawal like? How can I get information on withdrawal? GET READY  Set a quit date.  Change your environment by getting rid of all cigarettes, ashtrays, matches, and lighters in your home, car, or work. Do not let people smoke in your home.  Review your past attempts to quit. Think about what worked and what did not. GET SUPPORT AND ENCOURAGEMENT You have a better chance of being successful if you have help. You can get support in many ways.  Tell your family, friends, and coworkers that you are going to quit and need their support. Ask them not to smoke around you.  Get individual, group, or telephone counseling and support. Programs are available at local hospitals and health centers. Call your local health department for information about programs in your area.  Spiritual beliefs and practices may help some smokers quit.  Download a "quit meter" on your computer to keep track of quit statistics, such as how long you have gone without smoking, cigarettes not smoked, and money saved.  Get a self-help book about quitting smoking and staying off tobacco. LEARN NEW SKILLS AND BEHAVIORS  Distract yourself from urges to smoke. Talk to someone, go for a walk, or occupy your time with a task.  Change your normal routine. Take a different route to work. Drink tea  instead of coffee. Eat breakfast in a different place.  Reduce your stress. Take a hot bath, exercise, or read a book.  Plan something enjoyable to do every day. Reward yourself for not smoking.  Explore interactive web-based programs that specialize in helping you quit. GET MEDICINE AND USE IT CORRECTLY Medicines can help you stop smoking and decrease the urge to smoke. Combining medicine with the above behavioral methods and support can greatly increase your chances of successfully quitting smoking.  Nicotine replacement therapy helps deliver nicotine to your body without the negative effects and risks of smoking. Nicotine replacement therapy includes nicotine gum, lozenges,   inhalers, nasal sprays, and skin patches. Some may be available over-the-counter and others require a prescription.  Antidepressant medicine helps people abstain from smoking, but how this works is unknown. This medicine is available by prescription.  Nicotinic receptor partial agonist medicine simulates the effect of nicotine in your brain. This medicine is available by prescription. Ask your health care provider for advice about which medicines to use and how to use them based on your health history. Your health care provider will tell you what side effects to look out for if you choose to be on a medicine or therapy. Carefully read the information on the package. Do not use any other product containing nicotine while using a nicotine replacement product.  RELAPSE OR DIFFICULT SITUATIONS Most relapses occur within the first 3 months after quitting. Do not be discouraged if you start smoking again. Remember, most people try several times before finally quitting. You may have symptoms of withdrawal because your body is used to nicotine. You may crave cigarettes, be irritable, feel very hungry, cough often, get headaches, or have difficulty concentrating. The withdrawal symptoms are only temporary. They are strongest when you  first quit, but they will go away within 10-14 days. To reduce the chances of relapse, try to:  Avoid drinking alcohol. Drinking lowers your chances of successfully quitting.  Reduce the amount of caffeine you consume. Once you quit smoking, the amount of caffeine in your body increases and can give you symptoms, such as a rapid heartbeat, sweating, and anxiety.  Avoid smokers because they can make you want to smoke.  Do not let weight gain distract you. Many smokers will gain weight when they quit, usually less than 10 pounds. Eat a healthy diet and stay active. You can always lose the weight gained after you quit.  Find ways to improve your mood other than smoking. FOR MORE INFORMATION  www.smokefree.gov  Document Released: 09/03/2001 Document Revised: 01/24/2014 Document Reviewed: 12/19/2011 [employer] Patient Information 2015 [employer]. This information is not intended to replace advice given to you by your health care provider. Make sure you discuss any questions you have with your health care provider.    Smoking Cessation, Tips for Success If you are ready to quit smoking, congratulations! You have chosen to help yourself be healthier. Cigarettes bring nicotine, tar, carbon monoxide, and other irritants into your body. Your lungs, heart, and blood vessels will be able to work better without these poisons. There are many different ways to quit smoking. Nicotine gum, nicotine patches, a nicotine inhaler, or nicotine nasal spray can help with physical craving. Hypnosis, support groups, and medicines help break the habit of smoking. WHAT THINGS CAN I DO TO MAKE QUITTING EASIER?  Here are some tips to help you quit for good:  Pick a date when you will quit smoking completely. Tell all of your friends and family about your plan to quit on that date.  Do not try to slowly cut down on the number of cigarettes you are smoking. Pick a quit date and quit smoking completely starting on that  day.  Throw away all cigarettes.   Clean and remove all ashtrays from your home, work, and car.  On a card, write down your reasons for quitting. Carry the card with you and read it when you get the urge to smoke.  Cleanse your body of nicotine. Drink enough water and fluids to keep your urine clear or pale yellow. Do this after quitting to flush the nicotine from   your body.  Learn to predict your moods. Do not let a bad situation be your excuse to have a cigarette. Some situations in your life might tempt you into wanting a cigarette.  Never have "just one" cigarette. It leads to wanting another and another. Remind yourself of your decision to quit.  Change habits associated with smoking. If you smoked while driving or when feeling stressed, try other activities to replace smoking. Stand up when drinking your coffee. Brush your teeth after eating. Sit in a different chair when you read the paper. Avoid alcohol while trying to quit, and try to drink fewer caffeinated beverages. Alcohol and caffeine may urge you to smoke.  Avoid foods and drinks that can trigger a desire to smoke, such as sugary or spicy foods and alcohol.  Ask people who smoke not to smoke around you.  Have something planned to do right after eating or having a cup of coffee. For example, plan to take a walk or exercise.  Try a relaxation exercise to calm you down and decrease your stress. Remember, you may be tense and nervous for the first 2 weeks after you quit, but this will pass.  Find new activities to keep your hands busy. Play with a pen, coin, or rubber band. Doodle or draw things on paper.  Brush your teeth right after eating. This will help cut down on the craving for the taste of tobacco after meals. You can also try mouthwash.   Use oral substitutes in place of cigarettes. Try using lemon drops, carrots, cinnamon sticks, or chewing gum. Keep them handy so they are available when you have the urge to  smoke.  When you have the urge to smoke, try deep breathing.  Designate your home as a nonsmoking area.  If you are a heavy smoker, ask your health care provider about a prescription for nicotine chewing gum. It can ease your withdrawal from nicotine.  Reward yourself. Set aside the cigarette money you save and buy yourself something nice.  Look for support from others. Join a support group or smoking cessation program. Ask someone at home or at work to help you with your plan to quit smoking.  Always ask yourself, "Do I need this cigarette or is this just a reflex?" Tell yourself, "Today, I choose not to smoke," or "I do not want to smoke." You are reminding yourself of your decision to quit.  Do not replace cigarette smoking with electronic cigarettes (commonly called e-cigarettes). The safety of e-cigarettes is unknown, and some may contain harmful chemicals.  If you relapse, do not give up! Plan ahead and think about what you will do the next time you get the urge to smoke. HOW WILL I FEEL WHEN I QUIT SMOKING? You may have symptoms of withdrawal because your body is used to nicotine (the addictive substance in cigarettes). You may crave cigarettes, be irritable, feel very hungry, cough often, get headaches, or have difficulty concentrating. The withdrawal symptoms are only temporary. They are strongest when you first quit but will go away within 10-14 days. When withdrawal symptoms occur, stay in control. Think about your reasons for quitting. Remind yourself that these are signs that your body is healing and getting used to being without cigarettes. Remember that withdrawal symptoms are easier to treat than the major diseases that smoking can cause.  Even after the withdrawal is over, expect periodic urges to smoke. However, these cravings are generally short lived and will go away whether you   smoke or not. Do not smoke! WHAT RESOURCES ARE AVAILABLE TO HELP ME QUIT SMOKING? Your health care  provider can direct you to community resources or hospitals for support, which may include:  Group support.  Education.  Hypnosis.  Therapy. Document Released: 06/07/2004 Document Revised: 01/24/2014 Document Reviewed: 02/25/2013 [employer] Patient Information 2015 [employer]. This information is not intended to replace advice given to you by your health care provider. Make sure you discuss any questions you have with your health care provider.  

## 2014-12-09 NOTE — Progress Notes (Signed)
[facility] HISTORY AND PHYSICAL -PAD  History of Present Illness [name] is a 74 y.o. female  patient of Dr. Scot Dock with known atherosclerosis of the aorta and PAD. She returns today for ABI's and surveillance.  She reports radiating pain in both legs at rest and walking equally, no worse with walking, but this pain "slows me down". She has not shown her left calf lateral aspect mass to her PCP, advised to do so; this has been present since her 2000 heel surgery. 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  She denies non healing ulcers on legs/feet.  Has loose stools after eating.  Pt has not had previous intervention for PAD.  The patient denies New Medical or Surgical History. She had a 2 vessel CABG a few years ago, denies history of MI. She denies known history of stroke or TIA. She has a known chronic cough, states her PCP told her she had chronic bronchitis. Feels tired all of the time.  Pt Diabetic: No Pt smoker: smoker (1/3 ppd x since age 39 yrs)  Pt meds include: Statin :Yes Betablocker: Yes ASA: Yes Other anticoagulants/antiplatelets: no     Past Medical History  Diagnosis Date  . Hypertension   . GERD (gastroesophageal reflux disease)   . Anxiety   . Hyperlipidemia   . History of GI bleed     With AVM  . Anemia     Iron deficiency anemia  . Shoulder impingement syndrome     Right shoulder  . Aortic stenosis   . CAD (coronary artery disease)     psot bypass x2 to diagonal and obtuse marginal. SVG graft-01/08/2009  . Abdominal pain, unspecified site   . Myocardial infarction 2010  . Peripheral vascular disease   . Hypercholesterolemia   . Iron deficiency anemia     Social History History  Substance Use Topics  . Smoking status: Light Tobacco Smoker -- 0.30 packs/day  . Smokeless tobacco: Never Used     Comment: trying to quit  . Alcohol Use: No     Family History Family History  Problem Relation Age of Onset  . Hypertension Mother   . Kidney disease Mother     Past Surgical History  Procedure Laterality Date  . Aortic valve replacement      Edwards #21  . Cholecystectomy    . Spine surgery  2003    Herniated diskectomy  . Wrist surgery      right wrist  . Heel spur excision  2000    left heel  . Coronary artery bypass graft    . Cardiac surgery  2010    , 2 blockages    Allergies  Allergen Reactions  . Codeine Other (See Comments)    Feeling intoxicated    Current Outpatient Prescriptions  Medication Sig Dispense Refill  . amLODipine (NORVASC) 10 MG tablet Take 1 tablet (10 mg total) by mouth daily. 90 tablet 2  . aspirin 81 MG tablet Take 81 mg by mouth daily.    Marland Kitchen atorvastatin (LIPITOR) 80 MG tablet Take 1 tablet (80 mg total) by mouth daily. 90 tablet 2  . Calcium Carbonate-Vitamin D (CALCIUM 600+D) 600-400 MG-UNIT per tablet Take 1 tablet by mouth daily.    . COD LIVER OIL W/VIT A & D PO Take by mouth.    . ferrous sulfate 325 (65 FE) MG tablet Take 325 mg by  mouth daily with breakfast.    . lisinopril-hydrochlorothiazide (PRINZIDE,ZESTORETIC) 10-12.5 MG per tablet Take 1 tablet by mouth daily. 90 tablet 2  . liver oil-zinc oxide (DESITIN) 40 % ointment Apply topically as needed.    Marland Kitchen LORazepam (ATIVAN) 0.5 MG tablet Take 0.5 mg by mouth at bedtime as needed.    . Multiple Vitamin (MULTIVITAMIN) tablet Take 1 tablet by mouth daily.    . nebivolol (BYSTOLIC) 5 MG tablet Take 1 tablet (5 mg total) by mouth daily. 90 tablet 2  . omeprazole (PRILOSEC) 40 MG capsule Take 40 mg by mouth daily.    . potassium chloride (K-DUR,KLOR-CON) 10 MEQ tablet TAKE 1 TABLET BY MOUTH EVERY DAY 90 tablet 2  . sertraline (ZOLOFT) 100 MG tablet Take 150 mg by mouth daily.  1  . sertraline (ZOLOFT) 50 MG tablet Take 100 mg by mouth daily.      No current facility-administered medications for this visit.    ROS: See HPI for  pertinent positives and negatives.   Physical Examination  Filed Vitals:   12/09/14 1040  BP: 106/80  Pulse: 60  Resp: 16  Height: 5' 0.5" (1.537 m)  Weight: 159 lb (72.122 kg)  SpO2: 99%   Body mass index is 30.53 kg/(m^2).  General: A&O x 3, WDWN, obese female. Gait: limp Eyes: PERRLA. Pulmonary: CTAB, without wheezes , +rales in both bases,  no rhonchi. Cardiac: regular Rythm , without detected murmur.     Carotid Bruits Left Right   Negative Negative  Aorta is not palpable. Radial pulses: are 2+ palpable and =   VASCULAR EXAM: Extremities without ischemic changes  without Gangrene; without open wounds.     LE Pulses LEFT RIGHT   FEMORAL  palpable palpable    POPLITEAL not palpable  not palpable   POSTERIOR TIBIAL not palpable  not palpable    DORSALIS PEDIS  ANTERIOR TIBIAL not palpable  not palpable    Abdomen: soft, not tender, no palpable masses, central obesity. Skin: no rashes, no ulcers. Musculoskeletal: no muscle wasting or atrophy. Neurologic: A&O X 3; Appropriate Affect ; SENSATION: normal; MOTOR FUNCTION: moving all extremities equally, motor strength 4/5 in arms, 4/5 in legs. Speech is fluent/normal. CN 2-12 intact.          Non-Invasive Vascular Imaging: DATE: 12/09/2014 ABI: RIGHT 0.92 (0.98, 12/03/13), Waveforms: tri and biphasic;  LEFT 0.85 (0.80), Waveforms: bi and monophasic   ASSESSMENT: [name] is a 74 y.o. female who presents with known atherosclerosis of the aorta and PAD. She has known lumbar spine issue with radiculopathy, leg pain and fatigue are more likely related to this since her right ABI is WNL and the left shows evidence of mild arterial occlusive disease. Unfortunately she continues to  smoke.  Face to face time with patient was 25 minutes. Over 50% of this time was spent on counseling and coordination of care.   PLAN:  The patient was counseled re smoking cessation and given several free resources re smoking cessation.  Pt advised to see her PCP, Vyvyn Armando Reichert, ASAP re her dyspnea, cough, crackles in lung bases, and severe fatigue.   I discussed in depth with the patient the nature of atherosclerosis, and emphasized the importance of maximal medical management including strict control of blood pressure, blood glucose, and lipid levels, obtaining regular exercise, and cessation of smoking.  The patient is aware that without maximal medical management the underlying atherosclerotic disease process will progress, limiting the benefit of  any interventions.  Based on the patient's vascular studies and examination, pt will return to clinic in 1 year for ABI's.  The patient was given information about PAD including signs, symptoms, treatment, what symptoms should prompt the patient to seek immediate medical care, and risk reduction measures to take.  Clemon Chambers, RN, MSN, FNP-C [facility] Phone: [phone]  Clinic MD: Early on call  12/09/2014 10:53 AM

## 2014-12-21 ENCOUNTER — Other Ambulatory Visit: Payer: Self-pay | Admitting: Cardiology

## 2014-12-26 ENCOUNTER — Other Ambulatory Visit: Payer: Self-pay | Admitting: Cardiology

## 2015-01-11 ENCOUNTER — Encounter: Payer: Self-pay | Admitting: Cardiology

## 2015-01-11 ENCOUNTER — Ambulatory Visit (INDEPENDENT_AMBULATORY_CARE_PROVIDER_SITE_OTHER): Payer: Medicare Other | Admitting: Cardiology

## 2015-01-11 VITALS — BP 104/66 | HR 69 | Ht 60.5 in | Wt 158.0 lb

## 2015-01-11 DIAGNOSIS — E7800 Pure hypercholesterolemia, unspecified: Secondary | ICD-10-CM

## 2015-01-11 DIAGNOSIS — Z952 Presence of prosthetic heart valve: Secondary | ICD-10-CM | POA: Insufficient documentation

## 2015-01-11 DIAGNOSIS — R0600 Dyspnea, unspecified: Secondary | ICD-10-CM

## 2015-01-11 DIAGNOSIS — R1310 Dysphagia, unspecified: Secondary | ICD-10-CM | POA: Diagnosis not present

## 2015-01-11 DIAGNOSIS — Z79899 Other long term (current) drug therapy: Secondary | ICD-10-CM

## 2015-01-11 DIAGNOSIS — Z720 Tobacco use: Secondary | ICD-10-CM | POA: Insufficient documentation

## 2015-01-11 DIAGNOSIS — Z954 Presence of other heart-valve replacement: Secondary | ICD-10-CM

## 2015-01-11 DIAGNOSIS — I10 Essential (primary) hypertension: Secondary | ICD-10-CM

## 2015-01-11 LAB — CBC
HEMATOCRIT: 38.7 % (ref 36.0–46.0)
HEMOGLOBIN: 12.8 g/dL (ref 12.0–15.0)
MCHC: 33.1 g/dL (ref 30.0–36.0)
MCV: 91.2 fl (ref 78.0–100.0)
Platelets: 211 10*3/uL (ref 150.0–400.0)
RBC: 4.24 Mil/uL (ref 3.87–5.11)
RDW: 15.9 % — ABNORMAL HIGH (ref 11.5–15.5)
WBC: 6.4 10*3/uL (ref 4.0–10.5)

## 2015-01-11 LAB — BASIC METABOLIC PANEL
BUN: 13 mg/dL (ref 6–23)
CO2: 28 mEq/L (ref 19–32)
CREATININE: 0.65 mg/dL (ref 0.40–1.20)
Calcium: 9.9 mg/dL (ref 8.4–10.5)
Chloride: 108 mEq/L (ref 96–112)
GFR: 114.63 mL/min (ref >60.00)
GLUCOSE: 106 mg/dL — AB (ref 70–99)
Potassium: 3.7 mEq/L (ref 3.5–5.1)
Sodium: 142 mEq/L (ref 135–145)

## 2015-01-11 LAB — ALT: ALT: 21 U/L (ref 0–35)

## 2015-01-11 NOTE — Patient Instructions (Signed)
Medication Instructions:  The current medical regimen is effective;  continue present plan and medications.  Labwork: Please have blood work today.  Testing/Procedures: None  Follow-Up: Follow up in 6 months with Dr. [name].  You will receive a letter in the mail 2 months before you are due.  Please call us when you receive this letter to schedule your follow up appointment.  Thank you for choosing [facility]!!

## 2015-01-11 NOTE — Progress Notes (Signed)
[facility]. [address], [address] [place], [place]  [address] Phone: [phone] Fax:  [phone]  Date:  01/11/2015   ID:  [name], DOB 07/30/1941, MRN [id number]  PCP:  [name], MD   History of Present Illness: [name] is a 74 y.o. female with coronary artery disease status post bypass x2- grafts to diagonal and obtuse marginal 12/29/08, aortic valve replacement with bioprosthetic #21 valve, hypertension, AVM with prior GI bleed (no futher post AVR)  here for followup.  Echocardiogram  demonstrated bioprosthetic aortic valve velocity of 3.0 m/s which is upper limits of normal as well as ascending root dilated aorta of 4.3 cm. Her overall ejection fraction was normal.  In regards to lipids, LDL has been under good control with atorvastatin. Prior lab work reviewed. Previous creatinine 0.6.  Feels tired and breathing faster.   She has had continued somatic complaints, aches and pains she states. No exertional anginal symptoms. Left lateral leg pain and we continued to express the importance of exercise and conditioning. We have tried Bystolic instead of metoprolol because of previously described fatigue.  Holter monitor in May of 2015 demonstrated no significant bradycardia, rare PACs. No new medications needed.  Having some dysphagia, food getting stuck. Continues to have some shortness of breath with activity. Continues to smoke.   Wt Readings from Last 3 Encounters:  01/11/15 158 lb (71.668 kg)  12/09/14 159 lb (72.122 kg)  07/14/14 163 lb (73.936 kg)     Past Medical History  Diagnosis Date  . Hypertension   . GERD (gastroesophageal reflux disease)   . Anxiety   . Hyperlipidemia   . History of GI bleed     With AVM  . Anemia     Iron deficiency anemia  . Shoulder impingement syndrome     Right shoulder  . Aortic stenosis   . CAD (coronary artery disease)     psot bypass x2 to diagonal and obtuse marginal. SVG graft-01/08/2009  . Abdominal  pain, unspecified site   . Myocardial infarction 2010  . Peripheral vascular disease   . Hypercholesterolemia   . Iron deficiency anemia     Past Surgical History  Procedure Laterality Date  . Aortic valve replacement      Edwards #21  . Cholecystectomy    . Spine surgery  2003    Herniated diskectomy  . Wrist surgery      right wrist  . Heel spur excision  2000    left heel  . Coronary artery bypass graft    . Cardiac surgery  2010    , 2 blockages    Current Outpatient Prescriptions  Medication Sig Dispense Refill  . amLODipine (NORVASC) 10 MG tablet Take 1 tablet (10 mg total) by mouth daily. 90 tablet 2  . aspirin 81 MG tablet Take 81 mg by mouth daily.    Marland Kitchen atorvastatin (LIPITOR) 80 MG tablet TAKE 1 TABLET BY MOUTH EVERY DAY (NEEDS LABWORK) 30 tablet 2  . Calcium Carbonate-Vitamin D (CALCIUM 600+D) 600-400 MG-UNIT per tablet Take 1 tablet by mouth daily.    . COD LIVER OIL W/VIT A & D PO Take by mouth.    . ferrous sulfate 325 (65 FE) MG tablet Take 325 mg by mouth daily with breakfast.    . glycopyrrolate (ROBINUL) 2 MG tablet Take 2 mg by mouth 2 (two) times daily.  1  . lisinopril-hydrochlorothiazide (PRINZIDE,ZESTORETIC) 10-12.5 MG per tablet Take 1 tablet by  mouth daily. 90 tablet 2  . liver oil-zinc oxide (DESITIN) 40 % ointment Apply topically as needed.    Marland Kitchen LORazepam (ATIVAN) 0.5 MG tablet Take 0.5 mg by mouth at bedtime as needed.    . Multiple Vitamin (MULTIVITAMIN) tablet Take 1 tablet by mouth daily.    . nebivolol (BYSTOLIC) 5 MG tablet Take 1 tablet (5 mg total) by mouth daily. 90 tablet 2  . omeprazole (PRILOSEC) 40 MG capsule Take 40 mg by mouth daily.    . potassium chloride (K-DUR,KLOR-CON) 10 MEQ tablet TAKE 1 TABLET BY MOUTH EVERY DAY 90 tablet 2  . sertraline (ZOLOFT) 100 MG tablet Take 100 mg by mouth daily.    . sertraline (ZOLOFT) 50 MG tablet Take 100 mg by mouth daily.      No current facility-administered medications for this visit.     Allergies:    Allergies  Allergen Reactions  . Codeine Other (See Comments)    Feeling intoxicated    Social History:  The patient  reports that she has been smoking.  She has never used smokeless tobacco. She reports that she does not drink alcohol or use illicit drugs.   ROS:  Please see the history of present illness.   Denies any syncope, bleeding, orthopnea, PND.  Positive for fatigue, chest pain, leg swelling, shortness of breath, back pain, abdominal pain, blood in stool, cough.   PHYSICAL EXAM: VS:  BP 104/66 mmHg  Pulse 69  Ht 5' 0.5" (1.537 m)  Wt 158 lb (71.668 kg)  BMI 30.34 kg/m2  SpO2 95% Well nourished, well developed, in no acute distress HEENT: normal Neck: no JVD, radiation of aortic murmur Cardiac:  normal S1, S2; RRR; 2/6 systolic murmur Right upper sternal border. Scar noted. Lungs:  clear to auscultation bilaterally, no wheezing, rhonchi or rales Abd: soft, nontender, no hepatomegaly Ext: no edema Skin: warm and dry Neuro: no focal abnormalities noted  EKG: 07/14/14-sinus rhythm, minimal sinus arrhythmia, no other abnormalities. 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 No significant change from prior.  ECHO : 07/14/13 - - Left ventricle: There was mild focal basal hypertrophy of the septum. Systolic function was vigorous. The estimated ejection fraction was in the range of 65% to 70%. Wall motion was normal; there were no regional wall motion abnormalities. Doppler parameters are consistent with abnormal left ventricular relaxation (grade 1 diastolic dysfunction). - Aortic valve: A prosthesis was present and functioning normally. The prosthesis had a normal range of motion. The sewing ring appeared normal, had no rocking motion, and showed no evidence of dehiscence. Peak velocity: 338cm/s (S) , elevated for bioprosthetic valve. Prior echo 06/11/12 - Peak  vel 3.26ms. (Discussed with Dr. BPablo Ledger.  - Mitral valve: Calcified annulus.  LABS: 06/28/13 - LDL 67. ASSESSMENT AND PLAN:  1. Coronary artery disease status post bypass-currently overall doing well with no exertional anginal symptoms. Continue with aspirin., Beta blocker, statin, ACE inhibitor. Previous bypass anatomy reviewed. Secondary prevention. 2. Aortic valve replacement-upper normal velocity on echocardiogram. Continue to monitor. Murmur appreciated on exam. Discussed with Dr. BCyndia Bent 3. AVM-prior GI bleed. Once aortic valve replaced, no further bleeding episodes. This can be related to lack of degradation of von Willebrand's factor. She understands to take dental prophylaxis antibiotics. 4. Hypertension essential-under great control. No changes made. We will check basic metabolic profile. 5. Dysphagia-she has the sensation of food getting stuck at times. I asked her to discuss  this further with her gastroenterologist. With her prior GI bleeding history and ongoing aspirin use, I will check a CBC. 6. Hyperlipidemia- Overall good control. We will check lipid profile and ALT. High-dose statin. 7. Tobacco use-encourage tobacco cessation. 5 cigs a day.  8. Obesity-encourage weight loss. Discussed BMI with her. Decreasing carbohydrates. Diet. Hard for her to exercise. 9. Dilated aortic root-we will continue to monitor with echocardiogram.  10. 6 month follow  Signed, [name], MD [facility]  01/11/2015 10:40 AM

## 2015-03-14 ENCOUNTER — Ambulatory Visit
Admission: RE | Admit: 2015-03-14 | Discharge: 2015-03-14 | Disposition: A | Discharge status: Home / Self Care | Payer: Medicare Other | Source: Ambulatory Visit | Attending: Physician Assistant | Admitting: Physician Assistant

## 2015-03-14 ENCOUNTER — Encounter: Payer: Self-pay | Admitting: Physician Assistant

## 2015-03-14 ENCOUNTER — Telehealth: Payer: Self-pay | Admitting: Cardiology

## 2015-03-14 ENCOUNTER — Ambulatory Visit (INDEPENDENT_AMBULATORY_CARE_PROVIDER_SITE_OTHER): Payer: Medicare Other | Admitting: Physician Assistant

## 2015-03-14 VITALS — BP 119/70 | HR 69 | Ht 65.0 in | Wt 160.0 lb

## 2015-03-14 DIAGNOSIS — Z8719 Personal history of other diseases of the digestive system: Secondary | ICD-10-CM | POA: Insufficient documentation

## 2015-03-14 DIAGNOSIS — R0602 Shortness of breath: Secondary | ICD-10-CM | POA: Diagnosis not present

## 2015-03-14 DIAGNOSIS — I25810 Atherosclerosis of coronary artery bypass graft(s) without angina pectoris: Secondary | ICD-10-CM | POA: Diagnosis not present

## 2015-03-14 DIAGNOSIS — Z954 Presence of other heart-valve replacement: Secondary | ICD-10-CM | POA: Diagnosis not present

## 2015-03-14 DIAGNOSIS — I10 Essential (primary) hypertension: Secondary | ICD-10-CM

## 2015-03-14 DIAGNOSIS — Z720 Tobacco use: Secondary | ICD-10-CM

## 2015-03-14 DIAGNOSIS — I77810 Thoracic aortic ectasia: Secondary | ICD-10-CM

## 2015-03-14 DIAGNOSIS — R079 Chest pain, unspecified: Secondary | ICD-10-CM

## 2015-03-14 DIAGNOSIS — Z952 Presence of prosthetic heart valve: Secondary | ICD-10-CM

## 2015-03-14 DIAGNOSIS — I359 Nonrheumatic aortic valve disorder, unspecified: Secondary | ICD-10-CM

## 2015-03-14 DIAGNOSIS — I739 Peripheral vascular disease, unspecified: Secondary | ICD-10-CM

## 2015-03-14 DIAGNOSIS — E7800 Pure hypercholesterolemia, unspecified: Secondary | ICD-10-CM

## 2015-03-14 MED ORDER — FUROSEMIDE 20 MG PO TABS: 20.0000 mg | ORAL_TABLET | ORAL | Status: DC

## 2015-03-14 NOTE — Patient Instructions (Signed)
Medication Instructions:  1. START LASIX 20 MG DAILY FOR 3 DAYS THEN STOP 2. YOU WILL NEED TO TAKE EXTRA POTASSIUM 10 MEQ ON THE DAYS WHEN YOU TAKE LASIX FOR THE NEXT 3 DAYS  Labwork: TODAY BMET, CBC W/DIFF, BNP  Testing/Procedures: 1. Your physician has requested that you have an echocardiogram. Echocardiography is a painless test that uses sound waves to create images of your heart. It provides your doctor with information about the size and shape of your heart and how well your heart's chambers and valves are working. This procedure takes approximately one hour. There are no restrictions for this procedure.  2. Your physician has requested that you have a lexiscan myoview. For further information please visit [email or web address]. Please follow instruction sheet, as given.  3. A chest x-ray takes a picture of the organs and structures inside the chest, including the heart, lungs, and blood vessels. This test can show several things, including, whether the heart is enlarges; whether fluid is building up in the lungs; and whether pacemaker / defibrillator leads are still in place.   Follow-Up: SCOTT WEAVER, PAC SAME DAY DR. [name] IS IN THE OFFICE 04/03/15 @ 3 PM  Any Other Special Instructions Will Be Listed Below (If Applicable).

## 2015-03-14 NOTE — Telephone Encounter (Signed)
Calling stating she saw her PCP this AM and was told by her that she needed to be seen by her cardiologist for her SOB.  States Dr. Nancy Fetter did not want to change anything since she is being seen by Dr. [name].  States she has been having SOB for a couple of months but seems to be getting worse over the past week.  States when she walks any distance she get SOB and fast HR then when sits down has a "hard time breathing". States her ankles are swollen maybe more than usual.  Doesn't weigh herself at home. Wt at Dr. Lynnda Child office this AM was 161#.  States BP was normal in Dr. Lynnda Child office.  Last OV with Dr. [name] in April wt was 158. Spoke w/Scott Weaver,PA/flex who states he can see her at 3:00 today.  Advised pt and she will be here at 3:00 to see Margaret Pyle.

## 2015-03-14 NOTE — Telephone Encounter (Signed)
New Message    Pt states she is short of breath. Pt do not sound short of breath on the phone.  2. How long have she been experiences shortness of breath? She sates its been going on for a while  3. Are you short of breath when moving around? Moving around  4. Are you currently experiencing any other symptoms? She said nothing new just start to breath really hard.

## 2015-03-14 NOTE — Progress Notes (Signed)
Cardiology Office Note   Date:  03/14/2015   ID:  [name], DOB 10-Nov-1940, MRN [id number]  PCP:  [name], MD  Cardiologist:  Dr. [name]     Chief Complaint  Patient presents with  . Shortness of Breath     History of Present Illness: [name] is a 74 y.o. 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 Last echo in 06/2013 demonstrated normal LV function, mild diastolic dysfunction, normally functioning bioprosthetic AVR with velocity of 3 m/s (mean gradient 24 mmHg) and dilated ascending aortic root at 4.3 cm. Dr. [name] has reviewed this with Dr. [name] in the past. Continued observation has been recommended. Last seen by Dr. [name] 01/11/15. She was seen at primary care today and was noted to be short of breath. She was asked to follow-up. She called the office and is added on to the schedule urgently.  The patient tells me that she has been short of breath for quite some time now. It seems to be getting worse over the past 6 months. She also notes chest tightness. She notes this with eating. She has difficulty with dysphagia and odynophagia. She will also notice tightness in her chest with activities that make her short of breath. She is NYHA 3. She is not really certain if her chest discomfort is getting any worse. She denies orthopnea. She does describe what sounds like PND. She does note LE edema. There has been no significant increase. She denies syncope. She does have periodic lightheadedness. She had a previous cough but this is resolved. She denies fevers. She recently had a stool test and was told she had blood in her stool. No further workup was apparently needed. She denies weight loss, hematemesis. He does note black stools. She takes iron on a chronic basis.   Studies/Reports Reviewed  Today:  Event monitor 01/05/14 Rare PAC No significant arrhythmia  Echocardiogram 07/14/13 - Mild focal basal hypertrophy of the septum. Systolic function was vigorous. EF 65% to 70%. Wall motion was normal; Grade 1 diastolicdysfunction. - Aortic valve: A prosthesis was present and functioningnormally. The prosthesis had a normal range of motion. Thesewing ring appeared normal, had no rocking motion, andshowed no evidence of dehiscence. Peak velocity: 338cm/s(S) , elevated for bioprosthetic valve. Prior echo 06/11/12- Peak vel 3.31ms.  Mean gradient: 257mHg (S). Peak gradient: 3971mg (S). - Mitral valve: Calcified annulus.  LHC 12/12/08 LM: No CAD LAD: At D2 40%, D1 50-60% ostial LCx: Origin of AV groove 60% RCA: Moderate diffuse 50% throughout  Past Medical History  Diagnosis Date  . Hypertension   . GERD (gastroesophageal reflux disease)   . Anxiety   . Hyperlipidemia   . History of GI bleed     With AVM  . Anemia     Iron deficiency anemia  . Shoulder impingement syndrome     Right shoulder  . Aortic stenosis   . CAD (coronary artery disease)     psot bypass x2 to diagonal and obtuse marginal. SVG graft-01/08/2009  . Abdominal pain, unspecified site   . Myocardial infarction 2010  . Peripheral vascular disease   . Hypercholesterolemia   . Iron deficiency anemia     Past Surgical History  Procedure Laterality Date  . Aortic valve replacement      Edwards #21  .  Cholecystectomy    . Spine surgery  2003    Herniated diskectomy  . Wrist surgery      right wrist  . Heel spur excision  2000    left heel  . Coronary artery bypass graft    . Cardiac surgery  2010    , 2 blockages     Current Outpatient Prescriptions  Medication Sig Dispense Refill  . amLODipine (NORVASC) 10 MG tablet Take 1 tablet (10 mg total) by mouth daily. 90 tablet 2  . aspirin 81 MG tablet Take 81 mg by mouth daily.    Marland Kitchen atorvastatin (LIPITOR) 80 MG tablet TAKE 1 TABLET BY MOUTH  EVERY DAY (NEEDS LABWORK) 30 tablet 2  . Calcium Carbonate-Vitamin D (CALCIUM 600+D) 600-400 MG-UNIT per tablet Take 1 tablet by mouth daily.    . COD LIVER OIL W/VIT A & D PO Take by mouth.    . ferrous sulfate 325 (65 FE) MG tablet Take 325 mg by mouth daily with breakfast.    . glycopyrrolate (ROBINUL) 2 MG tablet Take 2 mg by mouth 2 (two) times daily.  1  . lisinopril-hydrochlorothiazide (PRINZIDE,ZESTORETIC) 10-12.5 MG per tablet Take 1 tablet by mouth daily. 90 tablet 2  . liver oil-zinc oxide (DESITIN) 40 % ointment Apply topically as needed.    Marland Kitchen LORazepam (ATIVAN) 0.5 MG tablet Take 0.5 mg by mouth at bedtime as needed.    . Multiple Vitamin (MULTIVITAMIN) tablet Take 1 tablet by mouth daily.    . nebivolol (BYSTOLIC) 5 MG tablet Take 1 tablet (5 mg total) by mouth daily. 90 tablet 2  . omeprazole (PRILOSEC) 40 MG capsule Take 40 mg by mouth daily.    . potassium chloride (K-DUR,KLOR-CON) 10 MEQ tablet TAKE 1 TABLET BY MOUTH EVERY DAY 90 tablet 2  . sertraline (ZOLOFT) 50 MG tablet Take 100 mg by mouth daily.     . furosemide (LASIX) 20 MG tablet Take 1 tablet (20 mg total) by mouth as directed. 1 tab for 3 days then STOP 30 tablet 0   No current facility-administered medications for this visit.    Allergies:   Codeine    Social History:  The patient  reports that she has been smoking.  She has never used smokeless tobacco. She reports that she does not drink alcohol or use illicit drugs.   Family History:  The patient's family history includes Diabetes in her brother; Hypertension in her brother, daughter, father, mother, and sister; Kidney disease in her mother. There is no history of Heart attack or Stroke.    ROS:   Please see the history of present illness.   Review of Systems  HENT: Positive for headaches.   Cardiovascular: Positive for paroxysmal nocturnal dyspnea.  Musculoskeletal: Positive for back pain, joint pain and myalgias.  Gastrointestinal: Positive for  hematochezia.  All other systems reviewed and are negative.     PHYSICAL EXAM: VS:  BP 119/70 mmHg  Pulse 69  Ht '5\' 5"'$  (1.651 m)  Wt 160 lb (72.576 kg)  BMI 26.63 kg/m2  SpO2 98%    Wt Readings from Last 3 Encounters:  03/14/15 160 lb (72.576 kg)  01/11/15 158 lb (71.668 kg)  12/09/14 159 lb (72.122 kg)     GEN: Well nourished, well developed, in no acute distress HEENT: normal Neck: no JVD,   no masses Cardiac:  Normal S1/S2, RRR;  2/6 systolic murmur RUSB,  no rubs or gallops, trace bilateral LE edema   Respiratory:  Decreased  breath sounds bilaterally, no wheezing, rhonchi or rales. GI: soft, distended, diffusely tender MS: no deformity or atrophy Skin: warm and dry  Neuro:  CNs II-XII intact, Strength and sensation are intact Psych: Normal affect   EKG:  EKG is ordered today.  It demonstrates:   NSR, HR 69, Normal axis, NSSTTW changes.    Recent Labs: 01/11/2015: ALT 21; BUN 13; Creatinine, Ser 0.65; Hemoglobin 12.8; Platelets 211.0; Potassium 3.7; Sodium 142    Lipid Panel    Component Value Date/Time   CHOL 124 01/31/2014 0926   TRIG 74.0 01/31/2014 0926   HDL 36.10* 01/31/2014 0926   CHOLHDL 3 01/31/2014 0926   VLDL 14.8 01/31/2014 0926   LDLCALC 73 01/31/2014 0926      ASSESSMENT AND PLAN:  Shortness of breath:  Etiology not clear. She likely has multifactorial dyspnea. It is not apparent that she has had heart failure in the past. She does appear somewhat volume overloaded on exam today. She has some abdominal distention and LE edema. She does have a significant smoking history. She had fairly stable gradients across her aortic valve in 2014. She has not had an assessment for ischemia since her bypass in 2010. She does have a prior history of GI bleeding in the setting of AVMs. She was apparently told that she had blood in her stool recently. She also has a lot of symptoms of dysphagia and odynophagia. She is not currently followed by gastroenterology.  -   Obtain chest x-ray, CBC, BMET, BNP  -  Start Lasix 20 mg daily 3 days, then stop. Take extra potassium 10 mEq daily 3 days.  -  Patient will let us know if she has significant improvement with 3 days of diuresis.  -  Obtain follow-up echocardiogram to assess LV function, right heart pressures, AVR  -  Arrange stress testing  -  Consider pulmonology evaluation for probable COPD if cardiac workup unrevealing.  Aortic valve disorder S/P AVR:  Obtain follow-up echocardiogram. Continue SBE prophylaxis.  Dilated aortic root:  Obtain follow-up echo as noted.  Coronary artery disease involving coronary bypass graft of native heart without angina pectoris:  She has chest tightness with activity. She also has chest tightness with eating certain meals. She also has significant gastrointestinal symptoms including dysphagia and odynophagia. Proceed with Myoview as noted. Continue amlodipine, aspirin, statin, beta blocker, ACE inhibitor.  Essential hypertension:  Controlled.  Pure hypercholesterolemia:  Continue statin.  PVD (peripheral vascular disease):  Follow-up with vascular surgery is noted.  Tobacco abuse:  I suspect that she has COPD which is likely part of the explanation for her dyspnea. I have recommended tobacco cessation. She is not ready to quit. As noted, if she has an unremarkable cardiac workup, consider pulmonology evaluation. This can certainly be performed with her primary care physician.  History of GI bleed:  She has chronically black stools secondary to iron. She had recent stool test that was Hemoccult-positive per her report. Check CBC today.  Dysphagia:  After cardiac workup, she will need referral to gastroenterology for further evaluation. Continue PPI.    Medication Changes: Current medicines are reviewed at length with the patient today.  Concerns regarding medicines are as outlined above.  The following changes have been made:   Discontinued Medications   SERTRALINE  (ZOLOFT) 100 MG TABLET    Take 100 mg by mouth daily.   Modified Medications   No medications on file   New Prescriptions   FUROSEMIDE (LASIX) 20 MG TABLET  Take 1 tablet (20 mg total) by mouth as directed. 1 tab for 3 days then STOP    Labs/ tests ordered today include:   Orders Placed This Encounter  Procedures  . DG Chest 2 View  . Basic Metabolic Panel (BMET)  . CBC w/Diff  . B Nat Peptide  . Myocardial Perfusion Imaging  . EKG 12-Lead  . Echocardiogram     Disposition:   FU with Dr. [name] or me 2-3 weeks.    Signed, Versie Starks, MHS 03/14/2015 4:07 PM    [facility] [address], [place], [place]  [address] Phone: [phone]; Fax: [phone]

## 2015-03-15 ENCOUNTER — Telehealth: Payer: Self-pay | Admitting: *Deleted

## 2015-03-15 ENCOUNTER — Other Ambulatory Visit: Payer: Self-pay | Admitting: *Deleted

## 2015-03-15 DIAGNOSIS — I10 Essential (primary) hypertension: Secondary | ICD-10-CM

## 2015-03-15 LAB — BASIC METABOLIC PANEL
BUN: 11 mg/dL (ref 6–23)
CHLORIDE: 107 meq/L (ref 96–112)
CO2: 28 meq/L (ref 19–32)
Calcium: 9.7 mg/dL (ref 8.4–10.5)
Creatinine, Ser: 0.65 mg/dL (ref 0.40–1.20)
GFR: 114.58 mL/min (ref >60.00)
GLUCOSE: 69 mg/dL — AB (ref 70–99)
POTASSIUM: 3.2 meq/L — AB (ref 3.5–5.1)
Sodium: 143 mEq/L (ref 135–145)

## 2015-03-15 LAB — CBC WITH DIFFERENTIAL/PLATELET
Basophils Absolute: 0.1 10*3/uL (ref 0.0–0.1)
Basophils Relative: 0.7 % (ref 0.0–3.0)
EOS PCT: 1.5 % (ref 0.0–5.0)
Eosinophils Absolute: 0.1 10*3/uL (ref 0.0–0.7)
HCT: 42 % (ref 36.0–46.0)
Hemoglobin: 13.9 g/dL (ref 12.0–15.0)
LYMPHS PCT: 28.3 % (ref 12.0–46.0)
Lymphs Abs: 2.4 10*3/uL (ref 0.7–4.0)
MCHC: 33.1 g/dL (ref 30.0–36.0)
MCV: 93.3 fl (ref 78.0–100.0)
Monocytes Absolute: 0.5 10*3/uL (ref 0.1–1.0)
Monocytes Relative: 6.3 % (ref 3.0–12.0)
Neutro Abs: 5.3 10*3/uL (ref 1.4–7.7)
Neutrophils Relative %: 63.2 % (ref 43.0–77.0)
Platelets: 202 10*3/uL (ref 150.0–400.0)
RBC: 4.5 Mil/uL (ref 3.87–5.11)
RDW: 14.9 % (ref 11.5–15.5)
WBC: 8.3 10*3/uL (ref 4.0–10.5)

## 2015-03-15 LAB — BRAIN NATRIURETIC PEPTIDE: Pro B Natriuretic peptide (BNP): 32 pg/mL (ref 0.0–100.0)

## 2015-03-15 MED ORDER — POTASSIUM CHLORIDE CRYS ER 10 MEQ PO TBCR: EXTENDED_RELEASE_TABLET | ORAL | Status: DC

## 2015-03-15 NOTE — Telephone Encounter (Signed)
S/w pt about lab results and med changes. Pt asked for me to please call her daughter [name] to go over results and med changes because she could not remember or write it down. I then called [name] and went over results and med changes w/verbal understanding by [name] x 2. Pt agreeable to come in 7/1 for repeat bmet, daughter aware of this appt as well. I stated to daughter if they had any questions tomorrow that I will be out of the office however Richardson Dopp, PA will be in the office or they may speak with one of the nurses. [name] said thank you for all of our help.

## 2015-03-20 ENCOUNTER — Other Ambulatory Visit: Payer: Self-pay

## 2015-03-24 ENCOUNTER — Telehealth: Payer: Self-pay | Admitting: *Deleted

## 2015-03-24 ENCOUNTER — Other Ambulatory Visit (INDEPENDENT_AMBULATORY_CARE_PROVIDER_SITE_OTHER): Payer: Medicare Other | Admitting: *Deleted

## 2015-03-24 DIAGNOSIS — E876 Hypokalemia: Secondary | ICD-10-CM

## 2015-03-24 DIAGNOSIS — I10 Essential (primary) hypertension: Secondary | ICD-10-CM

## 2015-03-24 LAB — BASIC METABOLIC PANEL
BUN: 14 mg/dL (ref 6–23)
CO2: 28 mEq/L (ref 19–32)
CREATININE: 0.7 mg/dL (ref 0.40–1.20)
Calcium: 9.6 mg/dL (ref 8.4–10.5)
Chloride: 105 mEq/L (ref 96–112)
GFR: 105.18 mL/min (ref >60.00)
Glucose, Bld: 77 mg/dL (ref 70–99)
Potassium: 3.3 mEq/L — ABNORMAL LOW (ref 3.5–5.1)
Sodium: 141 mEq/L (ref 135–145)

## 2015-03-24 MED ORDER — POTASSIUM CHLORIDE ER 10 MEQ PO TBCR: 20.0000 meq | EXTENDED_RELEASE_TABLET | Freq: Two times a day (BID) | ORAL | Status: DC

## 2015-03-24 NOTE — Telephone Encounter (Signed)
s/w pt's daughter [name] ok per pt. cynthia aware of lab results and to increase K+ to 20 meq BID, bmet 7/7. [name] verbalized understanding to Plan of care by phone.

## 2015-03-28 ENCOUNTER — Telehealth (HOSPITAL_COMMUNITY): Payer: Self-pay

## 2015-03-28 ENCOUNTER — Other Ambulatory Visit: Payer: Self-pay | Admitting: Cardiology

## 2015-03-28 NOTE — Telephone Encounter (Signed)
Left message on voicemail in reference to upcoming appointment scheduled for 03-30-2015. Phone number given for a call back so details instructions can be given. Oletta Lamas, Shantoria Ellwood A

## 2015-03-29 ENCOUNTER — Telehealth (HOSPITAL_COMMUNITY): Payer: Self-pay

## 2015-03-29 NOTE — Telephone Encounter (Signed)
Patient given detailed instructions per Myocardial Perfusion Study Information Sheet for test on 03-30-2015 at 9:15am. Patient Notified to arrive 15 minutes early, and that it is imperative to arrive on time for appointment to keep from having the test rescheduled. Patient verbalized understanding. Oletta Lamas, Kylee Umana A

## 2015-03-30 ENCOUNTER — Ambulatory Visit (HOSPITAL_BASED_OUTPATIENT_CLINIC_OR_DEPARTMENT_OTHER): Payer: Medicare Other

## 2015-03-30 ENCOUNTER — Ambulatory Visit (HOSPITAL_COMMUNITY): Payer: Medicare Other | Attending: Cardiovascular Disease

## 2015-03-30 ENCOUNTER — Other Ambulatory Visit: Payer: Self-pay

## 2015-03-30 ENCOUNTER — Encounter: Payer: Self-pay | Admitting: Physician Assistant

## 2015-03-30 DIAGNOSIS — R0600 Dyspnea, unspecified: Secondary | ICD-10-CM | POA: Diagnosis present

## 2015-03-30 DIAGNOSIS — Z952 Presence of prosthetic heart valve: Secondary | ICD-10-CM | POA: Diagnosis not present

## 2015-03-30 DIAGNOSIS — I25810 Atherosclerosis of coronary artery bypass graft(s) without angina pectoris: Secondary | ICD-10-CM | POA: Diagnosis not present

## 2015-03-30 DIAGNOSIS — I359 Nonrheumatic aortic valve disorder, unspecified: Secondary | ICD-10-CM | POA: Diagnosis not present

## 2015-03-30 DIAGNOSIS — R0602 Shortness of breath: Secondary | ICD-10-CM | POA: Diagnosis not present

## 2015-03-30 DIAGNOSIS — R079 Chest pain, unspecified: Secondary | ICD-10-CM

## 2015-03-30 LAB — MYOCARDIAL PERFUSION IMAGING
CHL CUP NUCLEAR SDS: 3
CHL CUP NUCLEAR SRS: 0
CHL CUP NUCLEAR SSS: 3
CSEPPHR: 77 {beats}/min
LV dias vol: 56 mL
LV sys vol: 16 mL
RATE: 0.3
Rest HR: 61 {beats}/min
TID: 0.86

## 2015-03-30 MED ORDER — REGADENOSON 0.4 MG/5ML IV SOLN
0.4000 mg | Freq: Once | INTRAVENOUS | Status: AC
  Administered 2015-03-30: 0.4 mg via INTRAVENOUS

## 2015-03-30 MED ORDER — TECHNETIUM TC 99M SESTAMIBI GENERIC - CARDIOLITE
9.8000 | Freq: Once | INTRAVENOUS | Status: AC | PRN
  Administered 2015-03-30: 10 via INTRAVENOUS

## 2015-03-30 MED ORDER — TECHNETIUM TC 99M SESTAMIBI GENERIC - CARDIOLITE
30.7000 | Freq: Once | INTRAVENOUS | Status: AC | PRN
  Administered 2015-03-30: 30.7 via INTRAVENOUS

## 2015-03-31 ENCOUNTER — Telehealth: Payer: Self-pay | Admitting: *Deleted

## 2015-03-31 NOTE — Telephone Encounter (Signed)
I reached pt on her cell phone and she has been notified of both myoview and echo results by phone with verbal understanding.

## 2015-04-02 NOTE — Progress Notes (Signed)
Cardiology Office Note   Date:  04/03/2015   ID:  [name], DOB 04-06-1941, MRN [id number]  PCP:  [name], MD  Cardiologist:  Dr. [name]     Chief Complaint  Patient presents with  . Follow-up    shortness of breath     History of Present Illness: [name] is a 74 y.o. 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 Last echo in 06/2013 demonstrated normal LV function, mild diastolic dysfunction, normally functioning bioprosthetic AVR with velocity of 3 m/s (mean gradient 24 mmHg) and dilated ascending aortic root at 4.3 cm. Dr. [name] has reviewed this with Dr. [name] in the past. Continued observation has been recommended.   I saw her 6/21 with complaints of chest pain and dyspnea.  She appeared somewhat volume overloaded and I placed her on Lasix x 3 days. BNP was normal.  CXR was unremarkable.  Echo was obtained and demonstrated normal LVF, mod diastolic dysfunction, normally functioning AVR with stable gradients (mean 20 mmHg).  Myoview was obtained and demonstrated normal perfusion.  She returns for FU.   She did not really have that much improvement in her breathing with Lasix. She denies any further chest discomfort. She continues to have shortness of breath without significant improvement. She denies orthopnea, PND. LE edema is improved. She denies syncope.   Studies/Reports Reviewed Today:  Echo 03/30/15 - Left ventricle: EF 55% to 60%. Wall motion was normal; Grade 2 diastolic dysfunction. - Aortic valve: A bioprosthesis was present and functioningnormally.  Mean gradient (S): 20 mm Hg. - Pulmonary arteries:  mildly increased. PApeak pressure: 40 mm Hg (S). Impressions: - Aortic prosthesis gradients are similar to previous study.  Myoview 03/30/15 Low risk study Normal resting and stress  perfusion with no ischemia or infarction EF 72%   CXR 03/15/15 IMPRESSION:  No active cardiopulmonary disease.  Event monitor 01/05/14 Rare PAC No significant arrhythmia  Echocardiogram 07/14/13 - Mild focal basal hypertrophy of the septum. Systolic function was vigorous. EF 65% to 70%. Wall motion was normal; Grade 1 diastolicdysfunction. - Aortic valve: A prosthesis was present and functioningnormally. The prosthesis had a normal range of motion. Thesewing ring appeared normal, had no rocking motion, andshowed no evidence of dehiscence. Peak velocity: 338cm/s(S) , elevated for bioprosthetic valve. Prior echo 06/11/12- Peak vel 3.32ms.  Mean gradient: 24mHg (S). Peak gradient: 3951mg (S). - Mitral valve: Calcified annulus.  LHC 12/12/08 LM: No CAD LAD: At D2 40%, D1 50-60% ostial LCx: Origin of AV groove 60% RCA: Moderate diffuse 50% throughout   Past Medical History  Diagnosis Date  . Hypertension   . GERD (gastroesophageal reflux disease)   . Anxiety   . Hyperlipidemia   . History of GI bleed     With AVM  . Anemia     Iron deficiency anemia  . Shoulder impingement syndrome     Right shoulder  . Aortic stenosis   . CAD (coronary artery disease)     psot bypass x2 to diagonal and obtuse marginal. SVG graft-01/08/2009  . Abdominal pain, unspecified site   . Myocardial infarction 2010  . Peripheral vascular disease   . Hypercholesterolemia   . Iron deficiency anemia   . History of cardiovascular stress test     Myoview 7/16:  Low risk, no ischemia or  scar, EF 72%  . History of echocardiogram     Echo 7/16:  EF 55-60%, no RWMA, Gr 2 DD, AVR ok (mean 20 mmHg), PASP 40 mmHg    Past Surgical History  Procedure Laterality Date  . Aortic valve replacement      Edwards #21  . Cholecystectomy    . Spine surgery  2003    Herniated diskectomy  . Wrist surgery      right wrist  . Heel spur excision  2000    left heel  . Coronary artery bypass graft    .  Cardiac surgery  2010    , 2 blockages     Current Outpatient Prescriptions  Medication Sig Dispense Refill  . amLODipine (NORVASC) 10 MG tablet TAKE 1 TABLET BY MOUTH EVERY DAY 30 tablet 1  . aspirin 81 MG tablet Take 81 mg by mouth daily.    Marland Kitchen atorvastatin (LIPITOR) 80 MG tablet TAKE 1 TABLET BY MOUTH EVERY DAY (MUST MAKE APPOINTMENT FOR LAB WORK) 30 tablet 1  . Calcium Carbonate-Vitamin D (CALCIUM 600+D) 600-400 MG-UNIT per tablet Take 1 tablet by mouth daily.    . COD LIVER OIL W/VIT A & D PO Take by mouth.    . ferrous sulfate 325 (65 FE) MG tablet Take 325 mg by mouth daily with breakfast.    . furosemide (LASIX) 20 MG tablet Take 1 tablet (20 mg total) by mouth as directed. 1 tab for 3 days then STOP 30 tablet 0  . glycopyrrolate (ROBINUL) 2 MG tablet Take 2 mg by mouth 2 (two) times daily.  1  . lisinopril-hydrochlorothiazide (PRINZIDE,ZESTORETIC) 10-12.5 MG per tablet Take 1 tablet by mouth daily. 90 tablet 2  . liver oil-zinc oxide (DESITIN) 40 % ointment Apply topically as needed.    Marland Kitchen LORazepam (ATIVAN) 0.5 MG tablet Take 0.5 mg by mouth at bedtime as needed.    . meloxicam (MOBIC) 15 MG tablet Take 15 mg by mouth daily.  0  . Multiple Vitamin (MULTIVITAMIN) tablet Take 1 tablet by mouth daily.    . nebivolol (BYSTOLIC) 5 MG tablet Take 1 tablet (5 mg total) by mouth daily. 90 tablet 2  . omeprazole (PRILOSEC) 40 MG capsule Take 40 mg by mouth daily.    . potassium chloride (K-DUR) 10 MEQ tablet Take 2 tablets (20 mEq total) by mouth 2 (two) times daily. 120 tablet 3  . sertraline (ZOLOFT) 50 MG tablet Take 100 mg by mouth daily.      No current facility-administered medications for this visit.    Allergies:   Codeine    Social History:  The patient  reports that she has been smoking.  She has never used smokeless tobacco. She reports that she does not drink alcohol or use illicit drugs.   Family History:  The patient's family history includes Diabetes in her brother;  Hypertension in her brother, daughter, father, mother, and sister; Kidney disease in her mother. There is no history of Heart attack or Stroke.    ROS:   Please see the history of present illness.   Review of Systems  HENT: Positive for headaches.   Cardiovascular: Positive for dyspnea on exertion and leg swelling.  Respiratory: Positive for wheezing.   Musculoskeletal: Positive for back pain, joint pain and myalgias.  Gastrointestinal: Positive for dysphagia and hematochezia.  Neurological: Positive for dizziness.  All other systems reviewed and are negative.    PHYSICAL EXAM: VS:  BP 112/62 mmHg  Pulse 72  Ht '5\' 5"'$  (1.651 m)  Wt 158 lb (71.668 kg)  BMI 26.29 kg/m2  SpO2 95%    Wt Readings from Last 3 Encounters:  04/03/15 158 lb (71.668 kg)  03/30/15 160 lb (72.576 kg)  03/14/15 160 lb (72.576 kg)     GEN: Well nourished, well developed, in no acute distress HEENT: normal Neck: no JVD, no masses Cardiac:  Normal S1/S2, RRR;  2/6 systolic murmur RUSB,  no rubs or gallops, no LE edema   Respiratory:  Decreased breath sounds bilaterally, no wheezing, rhonchi or rales. GI: soft, distended, diffusely tender MS: no deformity or atrophy Skin: warm and dry  Neuro:  CNs II-XII intact, Strength and sensation are intact Psych: Normal affect   EKG:  EKG is not ordered today.  It demonstrates:   N/a    Recent Labs: 01/11/2015: ALT 21 03/14/2015: Hemoglobin 13.9; Platelets 202.0; Pro B Natriuretic peptide (BNP) 32.0 03/24/2015: BUN 14; Creatinine, Ser 0.70; Potassium 3.3*; Sodium 141    Lipid Panel    Component Value Date/Time   CHOL 124 01/31/2014 0926   TRIG 74.0 01/31/2014 0926   HDL 36.10* 01/31/2014 0926   CHOLHDL 3 01/31/2014 0926   VLDL 14.8 01/31/2014 0926   LDLCALC 73 01/31/2014 0926      ASSESSMENT AND PLAN:  1.   Shortness of breath:  Recent echo with normal LVF and stable AVR gradients.  Myoview low risk.  BNP was normal.  CXR was unremarkable.  I suspect  her dyspnea is related to COPD.  Refer to pulmonology.   2.   Aortic valve disorder S/P AVR:  Follow-up echocardiogram with normally functioning AVR and stable gradients. Continue SBE prophylaxis. 3.   Dilated aortic root:  Follow-up echo demonstrated normal aortic root. 4.   Coronary artery disease involving coronary bypass graft of native heart without angina pectoris:  Recent Myoview low risk.  She has experienced chest tightness with eating certain meals. She also has significant gastrointestinal symptoms including dysphagia and odynophagia.  Continue amlodipine, aspirin, statin, beta blocker, ACE inhibitor.  No further cardiac workup. I will refer to GI.   5.   Essential hypertension:  Controlled. 6.   Pure hypercholesterolemia:  Continue statin. 7.   PVD (peripheral vascular disease):  Follow-up with vascular surgery as planned. 8.  Tobacco abuse:  I suspect that she has COPD. I have recommended tobacco cessation. We discussed whether or not to use Chantix.  She is scared of the side effects of this medication. She does take an SSRI and benzodiazepine.  I would not recommend Chantix.  I have recommended Nicotine patches.  Recent cardiac workup unremarkable. I will refer to pulmonology for evaluation.  9.   History of GI bleed:  Recent Hgb stable. 10. Dysphagia:  Refer to gastroenterology for further evaluation. Continue PPI. 11. Hypokalemia:  Check BMET.    Medication Changes: Current medicines are reviewed at length with the patient today.  Concerns regarding medicines are as outlined above.  The following changes have been made:   Discontinued Medications   AMLODIPINE (NORVASC) 10 MG TABLET    Take 1 tablet (10 mg total) by mouth daily.   Modified Medications   No medications on file   New Prescriptions   No medications on file    Labs/ tests ordered today include:   Orders Placed This Encounter  Procedures  . Basic Metabolic Panel (BMET)  . Ambulatory referral to  Gastroenterology  . Ambulatory referral to Pulmonology     Disposition:  FU Dr. [name] 6 mos.     Signed, Versie Starks, MHS 04/03/2015 4:20 PM    [facility] [place], [place], [place]  [address] Phone: [phone]; Fax: [phone]

## 2015-04-03 ENCOUNTER — Encounter: Payer: Self-pay | Admitting: Physician Assistant

## 2015-04-03 ENCOUNTER — Ambulatory Visit (INDEPENDENT_AMBULATORY_CARE_PROVIDER_SITE_OTHER): Payer: Medicare Other | Admitting: Physician Assistant

## 2015-04-03 VITALS — BP 112/62 | HR 72 | Ht 65.0 in | Wt 158.0 lb

## 2015-04-03 DIAGNOSIS — Z954 Presence of other heart-valve replacement: Secondary | ICD-10-CM | POA: Diagnosis not present

## 2015-04-03 DIAGNOSIS — Z8719 Personal history of other diseases of the digestive system: Secondary | ICD-10-CM

## 2015-04-03 DIAGNOSIS — I359 Nonrheumatic aortic valve disorder, unspecified: Secondary | ICD-10-CM

## 2015-04-03 DIAGNOSIS — I10 Essential (primary) hypertension: Secondary | ICD-10-CM

## 2015-04-03 DIAGNOSIS — R0602 Shortness of breath: Secondary | ICD-10-CM

## 2015-04-03 DIAGNOSIS — Z952 Presence of prosthetic heart valve: Secondary | ICD-10-CM

## 2015-04-03 DIAGNOSIS — I25810 Atherosclerosis of coronary artery bypass graft(s) without angina pectoris: Secondary | ICD-10-CM | POA: Diagnosis not present

## 2015-04-03 DIAGNOSIS — Z720 Tobacco use: Secondary | ICD-10-CM

## 2015-04-03 DIAGNOSIS — E7800 Pure hypercholesterolemia, unspecified: Secondary | ICD-10-CM

## 2015-04-03 DIAGNOSIS — R1310 Dysphagia, unspecified: Secondary | ICD-10-CM

## 2015-04-03 LAB — BASIC METABOLIC PANEL
BUN: 16 mg/dL (ref 6–23)
CO2: 28 mEq/L (ref 19–32)
Calcium: 9.9 mg/dL (ref 8.4–10.5)
Chloride: 106 mEq/L (ref 96–112)
Creatinine, Ser: 0.73 mg/dL (ref 0.40–1.20)
GFR: 100.2 mL/min (ref >60.00)
Glucose, Bld: 70 mg/dL (ref 70–99)
Potassium: 3.5 mEq/L (ref 3.5–5.1)
Sodium: 142 mEq/L (ref 135–145)

## 2015-04-03 NOTE — Patient Instructions (Signed)
Medication Instructions:  Your physician recommends that you continue on your current medications as directed. Please refer to the Current Medication list given to you today.   Labwork: BMET TODAY  Testing/Procedures: NINE  Follow-Up: Your physician wants you to follow-up in: [place] DR. [name]. You will receive a reminder letter in the mail two months in advance. If you don't receive a letter, please call our office to schedule the follow-up appointment.  Any Other Special Instructions Will Be Listed Below (If Applicable). 1. A REFERRAL TO GASTROENTEROLOGY DX DYSPHAGIA  2. A REFERRAL TO PULMONOLOGY DX SOB, SMOKING HISTORY

## 2015-04-04 ENCOUNTER — Telehealth: Payer: Self-pay | Admitting: *Deleted

## 2015-04-04 ENCOUNTER — Encounter: Payer: Self-pay | Admitting: Nurse Practitioner

## 2015-04-04 NOTE — Telephone Encounter (Signed)
Pt notified of lab results by phone with verbal understanding by pt of results

## 2015-04-05 ENCOUNTER — Telehealth: Payer: Self-pay | Admitting: Physician Assistant

## 2015-04-05 ENCOUNTER — Other Ambulatory Visit (HOSPITAL_COMMUNITY): Payer: Medicare Other

## 2015-04-05 ENCOUNTER — Encounter (HOSPITAL_COMMUNITY): Payer: Medicare Other

## 2015-04-05 NOTE — Telephone Encounter (Signed)
I cb pt who just wanted to go back over her lab results from yesterday. I reviewed her results with her and answered any questions she had. Pt said thank you for taking the time.

## 2015-04-05 NOTE — Telephone Encounter (Signed)
New Message    Pt wants you to call her back so she can get information regarding the test results    Thank you

## 2015-04-10 ENCOUNTER — Other Ambulatory Visit: Payer: Self-pay | Admitting: Physician Assistant

## 2015-04-11 ENCOUNTER — Telehealth: Payer: Self-pay | Admitting: *Deleted

## 2015-04-11 NOTE — Telephone Encounter (Signed)
I s/w pt today about a refill request for lasix. I asked pt only took lasix x 3 days as ordered in June, said yes. Pharmacy advised that pt  only on lasix x 3 days though we did give # 30 incase need to continue. Pharm said ok, will d/c order.

## 2015-04-11 NOTE — Telephone Encounter (Signed)
No Richardson Dopp, PA-C   04/11/2015 5:08 PM

## 2015-04-19 ENCOUNTER — Encounter: Payer: Self-pay | Admitting: Nurse Practitioner

## 2015-04-19 ENCOUNTER — Ambulatory Visit (INDEPENDENT_AMBULATORY_CARE_PROVIDER_SITE_OTHER): Payer: Medicare Other | Admitting: Nurse Practitioner

## 2015-04-19 ENCOUNTER — Other Ambulatory Visit (INDEPENDENT_AMBULATORY_CARE_PROVIDER_SITE_OTHER): Payer: Medicare Other

## 2015-04-19 VITALS — BP 102/62 | HR 68

## 2015-04-19 DIAGNOSIS — R1013 Epigastric pain: Secondary | ICD-10-CM | POA: Diagnosis not present

## 2015-04-19 DIAGNOSIS — R1310 Dysphagia, unspecified: Secondary | ICD-10-CM

## 2015-04-19 DIAGNOSIS — K529 Noninfective gastroenteritis and colitis, unspecified: Secondary | ICD-10-CM

## 2015-04-19 LAB — CBC WITH DIFFERENTIAL/PLATELET
BASOS ABS: 0.1 10*3/uL (ref 0.0–0.1)
Basophils Relative: 0.7 % (ref 0.0–3.0)
EOS ABS: 0.2 10*3/uL (ref 0.0–0.7)
EOS PCT: 2.6 % (ref 0.0–5.0)
HEMATOCRIT: 42.5 % (ref 36.0–46.0)
HEMOGLOBIN: 14.4 g/dL (ref 12.0–15.0)
Lymphocytes Relative: 22.3 % (ref 12.0–46.0)
Lymphs Abs: 1.8 10*3/uL (ref 0.7–4.0)
MCHC: 33.8 g/dL (ref 30.0–36.0)
MCV: 92.3 fl (ref 78.0–100.0)
MONO ABS: 0.6 10*3/uL (ref 0.1–1.0)
Monocytes Relative: 7.9 % (ref 3.0–12.0)
NEUTROS ABS: 5.4 10*3/uL (ref 1.4–7.7)
Neutrophils Relative %: 66.5 % (ref 43.0–77.0)
Platelets: 200 10*3/uL (ref 150.0–400.0)
RBC: 4.6 Mil/uL (ref 3.87–5.11)
RDW: 13.8 % (ref 11.5–15.5)
WBC: 8.2 10*3/uL (ref 4.0–10.5)

## 2015-04-19 MED ORDER — SUCRALFATE 1 G PO TABS: 1.0000 g | ORAL_TABLET | Freq: Three times a day (TID) | ORAL | Status: DC

## 2015-04-19 NOTE — Patient Instructions (Addendum)
You have been scheduled for an endoscopy and colonoscopy. Please follow the written instructions given to you at your visit today. Please pick up your prep supplies at the pharmacy within the next 1-3 days. If you use inhalers (even only as needed), please bring them with you on the day of your procedure. Your physician has requested that you go to www.startemmi.com and enter the access code given to you at your visit today. This web site gives a general overview about your procedure. However, you should still follow specific instructions given to you by our office regarding your preparation for the procedure.  We have sent the following medications to your pharmacy for you to pick up at your convenience: carafate 1 gram three times daily  Your physician has requested that you go to the basement for the following lab work before leaving today: CBC

## 2015-04-19 NOTE — Progress Notes (Signed)
HPI :   Patient is a 74 year old female with multiple significant medical problems. Patient referred by cardiology for dysphagia. She actually has multiple GI complaints. Patient followed previously by [facility] but that was mainly for iron deficiency anemia.   Patient gives a 4 month history of solid food dysphagia.  Drinking fluids often helps to facilitate passage of food. Patient has a longstanding history of GERD and has been on a PPI for several years. She has frequent breakthrough pyrosis and also complains of epigastric burning, mainly in the morning and when hungry. Cold drinks help.   Patient gives a two year history of postprandial diarrhea. Occasionally she has a formed stool. No significant cramping. No blood in stools.    Past Medical History  Diagnosis Date  . Hypertension   . GERD (gastroesophageal reflux disease)   . Anxiety   . Hyperlipidemia   . History of GI bleed     With AVM  . Anemia     Iron deficiency anemia  . Shoulder impingement syndrome     Right shoulder  . Aortic stenosis   . CAD (coronary artery disease)     psot bypass x2 to diagonal and obtuse marginal. SVG graft-01/08/2009  . Abdominal pain, unspecified site   . Myocardial infarction 2010  . Peripheral vascular disease   . Hypercholesterolemia   . Iron deficiency anemia   . History of cardiovascular stress test     Myoview 7/16:  Low risk, no ischemia or scar, EF 72%  . History of echocardiogram     Echo 7/16:  EF 55-60%, no RWMA, Gr 2 DD, AVR ok (mean 20 mmHg), PASP 40 mmHg    Family History  Problem Relation Age of Onset  . Hypertension Mother   . Kidney disease Mother   . Heart attack Neg Hx   . Stroke Neg Hx   . Hypertension Father   . Hypertension Sister   . Hypertension Brother   . Hypertension Daughter   . Diabetes Brother    History  Substance Use Topics  . Smoking status: Light Tobacco Smoker -- 0.30 packs/day  . Smokeless tobacco: Never Used     Comment: trying to quit    . Alcohol Use: No   Current Outpatient Prescriptions  Medication Sig Dispense Refill  . amLODipine (NORVASC) 10 MG tablet TAKE 1 TABLET BY MOUTH EVERY DAY 30 tablet 1  . aspirin 81 MG tablet Take 81 mg by mouth daily.    Marland Kitchen atorvastatin (LIPITOR) 80 MG tablet TAKE 1 TABLET BY MOUTH EVERY DAY (MUST MAKE APPOINTMENT FOR LAB WORK) 30 tablet 1  . Calcium Carbonate-Vitamin D (CALCIUM 600+D) 600-400 MG-UNIT per tablet Take 1 tablet by mouth daily.    . COD LIVER OIL W/VIT A & D PO Take by mouth.    . ferrous sulfate 325 (65 FE) MG tablet Take 325 mg by mouth daily with breakfast.    . furosemide (LASIX) 20 MG tablet Take 1 tablet (20 mg total) by mouth as directed. 1 tab for 3 days then STOP 30 tablet 0  . glycopyrrolate (ROBINUL) 2 MG tablet Take 2 mg by mouth 2 (two) times daily.  1  . lisinopril-hydrochlorothiazide (PRINZIDE,ZESTORETIC) 10-12.5 MG per tablet Take 1 tablet by mouth daily. 90 tablet 2  . liver oil-zinc oxide (DESITIN) 40 % ointment Apply topically as needed.    Marland Kitchen LORazepam (ATIVAN) 0.5 MG tablet Take 0.5 mg by mouth at bedtime as needed.    . meloxicam (  MOBIC) 15 MG tablet Take 15 mg by mouth daily.  0  . Multiple Vitamin (MULTIVITAMIN) tablet Take 1 tablet by mouth daily.    . nebivolol (BYSTOLIC) 5 MG tablet Take 1 tablet (5 mg total) by mouth daily. 90 tablet 2  . omeprazole (PRILOSEC) 40 MG capsule Take 40 mg by mouth daily.    . potassium chloride (K-DUR) 10 MEQ tablet Take 2 tablets (20 mEq total) by mouth 2 (two) times daily. 120 tablet 3  . sertraline (ZOLOFT) 50 MG tablet Take 100 mg by mouth daily.      No current facility-administered medications for this visit.   Allergies  Allergen Reactions  . Codeine Other (See Comments)    Feeling intoxicated    Review of Systems: Positive for back pain and shortness of breath. All other systems reviewed and negative except where noted in HPI.   Physical Exam: BP 102/62 mmHg  Pulse 68 Constitutional:  Pleasant,well-developed, black female in no acute distress. HEENT: Normocephalic and atraumatic. Conjunctivae are normal. No scleral icterus. Neck supple.  Cardiovascular: Normal rate, regular rhythm.  Pulmonary/chest: Effort normal and breath sounds normal. No wheezing, rales or rhonchi. Abdominal: Soft, nondistended, nontender. Bowel sounds active throughout. There are no masses palpable. No hepatomegaly. Extremities: no edema Lymphadenopathy: No cervical adenopathy noted. Neurological: Alert and oriented to person place and time. Skin: Skin is warm and dry. No rashes noted. Psychiatric: Normal mood and affect. Behavior is normal.   ASSESSMENT AND PLAN:  8.  74 year old female with 4 month history of solid food dysphagia.  Rule out esophageal stricture.  For further evaluation patient will be scheduled for upper endoscopy. The benefits, risks, and potential complications of EGD with possible biopsies and/or dilation were discussed with the patient and she agrees to proceed.   2.  Bowel changes. Two year history of postprandial diarrhea.  For further evaluation patient will be scheduled for colonoscopy to be done at time of upper endoscopy. The risks, benefits, and alternatives to colonoscopy with possible biopsy and possible polypectomy were discussed with the patient and she consents to proceed.   3. Hx of intestinal AVMs. Remote history of Hemoccult-positive stool and recurrent anemia evaluated by [facility].  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 Colonoscopy July 2009 -  ulcerated cecum and ileocecal valve. Exam otherwise normal.  Ileocecal valve and cecum biopsies revealed surface ulceration/erosion and ischemic changes.  Small bowel video capsule study 2010 revealed nonbleeding AVMs throughout the small bowel. She is taking meloxicam, will check CBC since at increased risk for bleeding.  4. History of diverticular disease, status post rectosigmoid colectomy with low pelvic anastomosis and  mobilization of splenic flexure February 2005   5. Shortness of breath. Non-cardiac origin apparently. She has shortness of breath mainly with exertion. To see Pulmonary. Breathing normal, unlabored during our exam  Cc: Richardson Dopp, P.A-C

## 2015-04-20 NOTE — Progress Notes (Signed)
i agree with the above note, plan 

## 2015-05-03 ENCOUNTER — Other Ambulatory Visit: Payer: Self-pay | Admitting: Cardiology

## 2015-05-09 ENCOUNTER — Encounter: Payer: Self-pay | Admitting: Pulmonary Disease

## 2015-05-09 ENCOUNTER — Ambulatory Visit (INDEPENDENT_AMBULATORY_CARE_PROVIDER_SITE_OTHER)
Admission: RE | Admit: 2015-05-09 | Discharge: 2015-05-09 | Disposition: A | Discharge status: Home / Self Care | Payer: Medicare Other | Source: Ambulatory Visit | Attending: Pulmonary Disease | Admitting: Pulmonary Disease

## 2015-05-09 ENCOUNTER — Ambulatory Visit (INDEPENDENT_AMBULATORY_CARE_PROVIDER_SITE_OTHER): Payer: Medicare Other | Admitting: Pulmonary Disease

## 2015-05-09 VITALS — BP 118/68 | HR 73 | Ht 60.0 in | Wt 160.0 lb

## 2015-05-09 DIAGNOSIS — R0602 Shortness of breath: Secondary | ICD-10-CM

## 2015-05-09 DIAGNOSIS — R0600 Dyspnea, unspecified: Secondary | ICD-10-CM

## 2015-05-09 DIAGNOSIS — Z720 Tobacco use: Secondary | ICD-10-CM

## 2015-05-09 NOTE — Assessment & Plan Note (Addendum)
Today she performs simple spirometry which did not show evidence of airflow obstruction despite her lengthy smoking history. By symptoms, COPD seems most likely but her simple spirometry was not consistent with this. I explained to her that the differential diagnosis of shortness of breath is broad and includes heart disease, anemia, neurologic disorders, and other pulmonary disorders. On exam she does have some crackles in the bases of her lungs so this raises concern for an interstitial lung disease but on my personal review of her most recent chest x-ray I cannot see evidence of an underlying lung disease. On physical exam she did have some mild weakness in her arms and legs and she has noted some cramping recently. A hemoglobin from last week was normal. Her recent stress test and echocardiogram were essentially within normal limits but she did have a slightly elevated PA pressure. If she has a decreased DLCO was normal total lung capacity then she may need a right heart catheterization.  Plan: Obtain full pulmonary function testing Repeat chest x-ray A full pulmonary function testing suggests restrictive lung disease then we will arrange a CT scan of her chest If no evidence of restrictive lung disease but she has a diffusion capacity deficit, then she may need a right heart catheterization

## 2015-05-09 NOTE — Progress Notes (Signed)
Subjective:    Patient ID: [name], female    DOB: 1941/06/20, 74 y.o.   MRN: [id number]  HPI Chief Complaint  Patient presents with  . Advice Only    Referred by Richardson Dopp for sob X1 year.     [name] says that she has struggled from fatigue and dyspnea for quite a while now.  She noticed the dyspnea about a year ago. Prior to that she had never been told she had a respiratory problem, no childhood respiratory problems.  She smoked 1/2 ppd, and is still smoking. She started smoking 52 years ago.  She doesn't cough every day, but every now and then.  No mucus production to speak of.  She has noticed that she gets short of breath with carrying in groceries.  Walking on level ground is OK.  She has noticed that some dust and dirt exposure makes her worse.  When she is short of breath she feels fatigued and feels like her heart is racing.  She hasn't noticed wheezing or cough when she is short of breath.  She has wheezed before, but it is not frequent.  She sometimes has chest congestion.  She has sinus congestion and some indigestion and heartburn.   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  She recently went through an extensive cardiac evaluation which was normal.  She says that she had bad breathing problems before her heart surgery, and her dyspnea improved after surgery.  Past Medical History  Diagnosis Date  . Hypertension   . GERD (gastroesophageal reflux disease)   . Anxiety   . Hyperlipidemia   . History of GI bleed     With AVM  . Anemia     Iron deficiency anemia  . Shoulder impingement syndrome     Right shoulder  . Aortic stenosis   . CAD (coronary artery disease)     psot bypass x2 to diagonal and obtuse marginal. SVG graft-01/08/2009  . Abdominal pain, unspecified site   . Myocardial infarction 2010  . Peripheral vascular disease   . Hypercholesterolemia   . Iron deficiency anemia     . History of cardiovascular stress test     Myoview 7/16:  Low risk, no ischemia or scar, EF 72%  . History of echocardiogram     Echo 7/16:  EF 55-60%, no RWMA, Gr 2 DD, AVR ok (mean 20 mmHg), PASP 40 mmHg     Family History  Problem Relation Age of Onset  . Hypertension Mother   . Kidney disease Mother   . Heart attack Neg Hx   . Stroke Neg Hx   . Hypertension Father   . Hypertension Sister   . Hypertension Brother   . Hypertension Daughter   . Diabetes Brother      Social History   Social History  . Marital Status: Single    Spouse Name: N/A  . Number of Children: 3  . Years of Education: N/A   Occupational History  . Retired    Social History Main Topics  . Smoking status: Light Tobacco Smoker -- 0.40 packs/day for 52 years    Types: Cigarettes  . Smokeless tobacco: Never Used     Comment: trying to quit  . Alcohol Use: No  . Drug Use: No  . Sexual Activity: Not on file   Other Topics Concern  . Not on file   Social  History Narrative     Allergies  Allergen Reactions  . Codeine Other (See Comments)    Feeling intoxicated     Outpatient Prescriptions Prior to Visit  Medication Sig Dispense Refill  . amLODipine (NORVASC) 10 MG tablet TAKE 1 TABLET BY MOUTH EVERY DAY 30 tablet 1  . aspirin 81 MG tablet Take 81 mg by mouth daily.    Marland Kitchen atorvastatin (LIPITOR) 80 MG tablet TAKE 1 TABLET BY MOUTH EVERY DAY (MUST MAKE APPOINTMENT FOR LAB WORK) 30 tablet 1  . BYSTOLIC 5 MG tablet TAKE 1 TABLET BY MOUTH EVERY DAY 90 tablet 1  . Calcium Carbonate-Vitamin D (CALCIUM 600+D) 600-400 MG-UNIT per tablet Take 1 tablet by mouth daily.    . COD LIVER OIL W/VIT A & D PO Take by mouth.    . ferrous sulfate 325 (65 FE) MG tablet Take 325 mg by mouth daily with breakfast.    . furosemide (LASIX) 20 MG tablet Take 1 tablet (20 mg total) by mouth as directed. 1 tab for 3 days then STOP 30 tablet 0  . glycopyrrolate (ROBINUL) 2 MG tablet Take 2 mg by mouth 2 (two) times daily.   1  . lisinopril-hydrochlorothiazide (PRINZIDE,ZESTORETIC) 10-12.5 MG per tablet Take 1 tablet by mouth daily. 90 tablet 2  . liver oil-zinc oxide (DESITIN) 40 % ointment Apply topically as needed.    Marland Kitchen LORazepam (ATIVAN) 0.5 MG tablet Take 0.5 mg by mouth at bedtime as needed.    . meloxicam (MOBIC) 15 MG tablet Take 15 mg by mouth daily.  0  . Multiple Vitamin (MULTIVITAMIN) tablet Take 1 tablet by mouth daily.    Marland Kitchen omeprazole (PRILOSEC) 40 MG capsule Take 40 mg by mouth daily.    . potassium chloride (K-DUR) 10 MEQ tablet Take 2 tablets (20 mEq total) by mouth 2 (two) times daily. 120 tablet 3  . sertraline (ZOLOFT) 50 MG tablet Take 100 mg by mouth daily.     . sucralfate (CARAFATE) 1 G tablet Take 1 tablet (1 g total) by mouth 4 (four) times daily -  with meals and at bedtime. 90 tablet 1   No facility-administered medications prior to visit.       Review of Systems  Constitutional: Negative for fever and unexpected weight change.  HENT: Negative for congestion, dental problem, ear pain, nosebleeds, postnasal drip, rhinorrhea, sinus pressure, sneezing, sore throat and trouble swallowing.   Eyes: Negative for redness and itching.  Respiratory: Positive for cough and shortness of breath. Negative for chest tightness and wheezing.   Cardiovascular: Negative for palpitations and leg swelling.  Gastrointestinal: Negative for nausea and vomiting.  Genitourinary: Negative for dysuria.  Musculoskeletal: Negative for joint swelling.  Skin: Negative for rash.  Neurological: Negative for headaches.  Hematological: Does not bruise/bleed easily.  Psychiatric/Behavioral: Negative for dysphoric mood. The patient is not nervous/anxious.        Objective:   Physical Exam  Filed Vitals:   05/09/15 1532  BP: 118/68  Pulse: 73  Height: 5' (1.524 m)  Weight: 72.576 kg (160 lb)  SpO2: 96%   RA  Gen: well appearing, no acute distress HENT: NCAT, OP clear, neck supple without  masses Eyes: PERRL, EOMi Lymph: no cervical lymphadenopathy PULM: Crackles bases to 1/2 way up bilaterally, normal effort CV: RRR, mechanical valve, no JVD GI: BS+, soft, nontender, no hsm Derm: no rash or skin breakdown MSK: normal bulk and tone Neuro: A&Ox4, CN II-XII intact, strength 5/5 in all 4 extremities Psyche:  normal mood and affect   CBC two weeks ago showed normal hemoglobin Cardiology records from 2016 reviewed where she was referred to gastroenterology for chest tightness. July 2016 nuclear stress test normal Echocardiogram July 2016 shows a normally function aortic valve prosthesis, normal LVEF, mildly elevated PA pressure, normal RV size    Assessment & Plan:  Dyspnea Today she performs simple spirometry which did not show evidence of airflow obstruction despite her lengthy smoking history. By symptoms, COPD seems most likely but her simple spirometry was not consistent with this. I explained to her that the differential diagnosis of shortness of breath is broad and includes heart disease, anemia, neurologic disorders, and other pulmonary disorders. On exam she does have some crackles in the bases of her lungs so this raises concern for an interstitial lung disease but on my personal review of her most recent chest x-ray I cannot see evidence of an underlying lung disease. On physical exam she did have some mild weakness in her arms and legs and she has noted some cramping recently. A hemoglobin from last week was normal. Her recent stress test and echocardiogram were essentially within normal limits but she did have a slightly elevated PA pressure. If she has a decreased DLCO was normal total lung capacity then she may need a right heart catheterization.  Plan: Obtain full pulmonary function testing Repeat chest x-ray A full pulmonary function testing suggests restrictive lung disease then we will arrange a CT scan of her chest If no evidence of restrictive lung disease but  she has a diffusion capacity deficit, then she may need a right heart catheterization  Tobacco use She was advised at length to quit smoking.     Current outpatient prescriptions:  .  amLODipine (NORVASC) 10 MG tablet, TAKE 1 TABLET BY MOUTH EVERY DAY, Disp: 30 tablet, Rfl: 1 .  aspirin 81 MG tablet, Take 81 mg by mouth daily., Disp: , Rfl:  .  atorvastatin (LIPITOR) 80 MG tablet, TAKE 1 TABLET BY MOUTH EVERY DAY (MUST MAKE APPOINTMENT FOR LAB WORK), Disp: 30 tablet, Rfl: 1 .  BYSTOLIC 5 MG tablet, TAKE 1 TABLET BY MOUTH EVERY DAY, Disp: 90 tablet, Rfl: 1 .  Calcium Carbonate-Vitamin D (CALCIUM 600+D) 600-400 MG-UNIT per tablet, Take 1 tablet by mouth daily., Disp: , Rfl:  .  COD LIVER OIL W/VIT A & D PO, Take by mouth., Disp: , Rfl:  .  ferrous sulfate 325 (65 FE) MG tablet, Take 325 mg by mouth daily with breakfast., Disp: , Rfl:  .  furosemide (LASIX) 20 MG tablet, Take 1 tablet (20 mg total) by mouth as directed. 1 tab for 3 days then STOP, Disp: 30 tablet, Rfl: 0 .  glycopyrrolate (ROBINUL) 2 MG tablet, Take 2 mg by mouth 2 (two) times daily., Disp: , Rfl: 1 .  lisinopril-hydrochlorothiazide (PRINZIDE,ZESTORETIC) 10-12.5 MG per tablet, Take 1 tablet by mouth daily., Disp: 90 tablet, Rfl: 2 .  liver oil-zinc oxide (DESITIN) 40 % ointment, Apply topically as needed., Disp: , Rfl:  .  LORazepam (ATIVAN) 0.5 MG tablet, Take 0.5 mg by mouth at bedtime as needed., Disp: , Rfl:  .  meloxicam (MOBIC) 15 MG tablet, Take 15 mg by mouth daily., Disp: , Rfl: 0 .  Multiple Vitamin (MULTIVITAMIN) tablet, Take 1 tablet by mouth daily., Disp: , Rfl:  .  omeprazole (PRILOSEC) 40 MG capsule, Take 40 mg by mouth daily., Disp: , Rfl:  .  potassium chloride (K-DUR) 10 MEQ tablet, Take 2  tablets (20 mEq total) by mouth 2 (two) times daily., Disp: 120 tablet, Rfl: 3 .  sertraline (ZOLOFT) 50 MG tablet, Take 100 mg by mouth daily. , Disp: , Rfl:  .  sucralfate (CARAFATE) 1 G tablet, Take 1 tablet (1 g total)  by mouth 4 (four) times daily -  with meals and at bedtime., Disp: 90 tablet, Rfl: 1

## 2015-05-09 NOTE — Assessment & Plan Note (Signed)
She was advised at length to quit smoking.

## 2015-05-09 NOTE — Patient Instructions (Signed)
We will arrange a pulmonary function testing and a chest x-ray and call you with the results We will see you back in 3-4 weeks or sooner if needed

## 2015-05-11 ENCOUNTER — Emergency Department (HOSPITAL_COMMUNITY): Payer: Medicare Other

## 2015-05-11 ENCOUNTER — Encounter (HOSPITAL_COMMUNITY): Payer: Self-pay | Admitting: Emergency Medicine

## 2015-05-11 ENCOUNTER — Emergency Department (HOSPITAL_COMMUNITY)
Admission: EM | Admit: 2015-05-11 | Discharge: 2015-05-11 | Disposition: A | Discharge status: Home / Self Care | Payer: Medicare Other | Attending: Emergency Medicine | Admitting: Emergency Medicine

## 2015-05-11 DIAGNOSIS — Z951 Presence of aortocoronary bypass graft: Secondary | ICD-10-CM | POA: Insufficient documentation

## 2015-05-11 DIAGNOSIS — Z9889 Other specified postprocedural states: Secondary | ICD-10-CM | POA: Insufficient documentation

## 2015-05-11 DIAGNOSIS — Y999 Unspecified external cause status: Secondary | ICD-10-CM | POA: Diagnosis not present

## 2015-05-11 DIAGNOSIS — F419 Anxiety disorder, unspecified: Secondary | ICD-10-CM | POA: Insufficient documentation

## 2015-05-11 DIAGNOSIS — I10 Essential (primary) hypertension: Secondary | ICD-10-CM | POA: Insufficient documentation

## 2015-05-11 DIAGNOSIS — E785 Hyperlipidemia, unspecified: Secondary | ICD-10-CM | POA: Insufficient documentation

## 2015-05-11 DIAGNOSIS — I2510 Atherosclerotic heart disease of native coronary artery without angina pectoris: Secondary | ICD-10-CM | POA: Diagnosis not present

## 2015-05-11 DIAGNOSIS — S199XXA Unspecified injury of neck, initial encounter: Secondary | ICD-10-CM | POA: Diagnosis not present

## 2015-05-11 DIAGNOSIS — I252 Old myocardial infarction: Secondary | ICD-10-CM | POA: Diagnosis not present

## 2015-05-11 DIAGNOSIS — Z79899 Other long term (current) drug therapy: Secondary | ICD-10-CM | POA: Diagnosis not present

## 2015-05-11 DIAGNOSIS — S79921A Unspecified injury of right thigh, initial encounter: Secondary | ICD-10-CM | POA: Insufficient documentation

## 2015-05-11 DIAGNOSIS — Y92410 Unspecified street and highway as the place of occurrence of the external cause: Secondary | ICD-10-CM | POA: Insufficient documentation

## 2015-05-11 DIAGNOSIS — Z8739 Personal history of other diseases of the musculoskeletal system and connective tissue: Secondary | ICD-10-CM | POA: Diagnosis not present

## 2015-05-11 DIAGNOSIS — Z720 Tobacco use: Secondary | ICD-10-CM | POA: Diagnosis not present

## 2015-05-11 DIAGNOSIS — S299XXA Unspecified injury of thorax, initial encounter: Secondary | ICD-10-CM | POA: Diagnosis not present

## 2015-05-11 DIAGNOSIS — Y9389 Activity, other specified: Secondary | ICD-10-CM | POA: Diagnosis not present

## 2015-05-11 DIAGNOSIS — E780 Pure hypercholesterolemia: Secondary | ICD-10-CM | POA: Diagnosis not present

## 2015-05-11 DIAGNOSIS — Z7982 Long term (current) use of aspirin: Secondary | ICD-10-CM | POA: Insufficient documentation

## 2015-05-11 DIAGNOSIS — K219 Gastro-esophageal reflux disease without esophagitis: Secondary | ICD-10-CM | POA: Diagnosis not present

## 2015-05-11 DIAGNOSIS — Z791 Long term (current) use of non-steroidal anti-inflammatories (NSAID): Secondary | ICD-10-CM | POA: Diagnosis not present

## 2015-05-11 DIAGNOSIS — R0789 Other chest pain: Secondary | ICD-10-CM

## 2015-05-11 DIAGNOSIS — M79604 Pain in right leg: Secondary | ICD-10-CM

## 2015-05-11 DIAGNOSIS — D509 Iron deficiency anemia, unspecified: Secondary | ICD-10-CM | POA: Insufficient documentation

## 2015-05-11 MED ORDER — TRAMADOL HCL 50 MG PO TABS: 50.0000 mg | ORAL_TABLET | Freq: Four times a day (QID) | ORAL | Status: DC | PRN

## 2015-05-11 MED ORDER — FENTANYL CITRATE (PF) 100 MCG/2ML IJ SOLN
75.0000 ug | Freq: Once | INTRAMUSCULAR | Status: AC
  Administered 2015-05-11: 75 ug via INTRAMUSCULAR
  Filled 2015-05-11: qty 2

## 2015-05-11 MED ORDER — FENTANYL CITRATE (PF) 100 MCG/2ML IJ SOLN
75.0000 ug | Freq: Once | INTRAMUSCULAR | Status: DC
Start: 2015-05-11 — End: 2015-05-11

## 2015-05-11 NOTE — Discharge Instructions (Signed)
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
## 2015-05-13 ENCOUNTER — Other Ambulatory Visit: Payer: Self-pay | Admitting: Cardiology

## 2015-05-13 ENCOUNTER — Other Ambulatory Visit: Payer: Self-pay | Admitting: Nurse Practitioner

## 2015-05-15 ENCOUNTER — Ambulatory Visit
Admission: RE | Admit: 2015-05-15 | Discharge: 2015-05-15 | Disposition: A | Discharge status: Home / Self Care | Payer: Medicare Other | Source: Ambulatory Visit | Attending: Family Medicine | Admitting: Family Medicine

## 2015-05-15 ENCOUNTER — Other Ambulatory Visit: Payer: Self-pay | Admitting: Family Medicine

## 2015-05-15 DIAGNOSIS — S9031XA Contusion of right foot, initial encounter: Secondary | ICD-10-CM

## 2015-05-25 ENCOUNTER — Other Ambulatory Visit: Payer: Self-pay | Admitting: Cardiology

## 2015-06-14 ENCOUNTER — Ambulatory Visit (INDEPENDENT_AMBULATORY_CARE_PROVIDER_SITE_OTHER): Payer: Medicare Other | Admitting: Pulmonary Disease

## 2015-06-14 ENCOUNTER — Encounter: Payer: Self-pay | Admitting: Pulmonary Disease

## 2015-06-14 VITALS — BP 108/68 | HR 69 | Ht 60.0 in | Wt 158.0 lb

## 2015-06-14 DIAGNOSIS — I739 Peripheral vascular disease, unspecified: Secondary | ICD-10-CM

## 2015-06-14 DIAGNOSIS — R0600 Dyspnea, unspecified: Secondary | ICD-10-CM

## 2015-06-14 DIAGNOSIS — R0602 Shortness of breath: Secondary | ICD-10-CM | POA: Diagnosis not present

## 2015-06-14 DIAGNOSIS — Z720 Tobacco use: Secondary | ICD-10-CM

## 2015-06-14 LAB — PULMONARY FUNCTION TEST
DL/VA % pred: 94 %
DL/VA: 3.97 ml/min/mmHg/L
DLCO UNC: 8.44 ml/min/mmHg
DLCO unc % pred: 45 %
FEF 25-75 Post: 0.92 L/sec
FEF 25-75 Pre: 0.74 L/sec
FEF2575-%Change-Post: 24 %
FEF2575-%Pred-Post: 74 %
FEF2575-%Pred-Pre: 59 %
FEV1-%Change-Post: 0 %
FEV1-%PRED-POST: 71 %
FEV1-%PRED-PRE: 70 %
FEV1-POST: 0.96 L
FEV1-PRE: 0.96 L
FEV1FVC-%CHANGE-POST: 9 %
FEV1FVC-%Pred-Pre: 102 %
FEV6-%CHANGE-POST: -9 %
FEV6-%PRED-PRE: 71 %
FEV6-%Pred-Post: 65 %
FEV6-POST: 1.09 L
FEV6-PRE: 1.21 L
FEV6FVC-%PRED-POST: 105 %
FEV6FVC-%PRED-PRE: 105 %
FVC-%Change-Post: -8 %
FVC-%PRED-PRE: 68 %
FVC-%Pred-Post: 62 %
FVC-POST: 1.11 L
FVC-PRE: 1.21 L
POST FEV6/FVC RATIO: 100 %
Post FEV1/FVC ratio: 87 %
Pre FEV1/FVC ratio: 79 %
Pre FEV6/FVC Ratio: 100 %
RV % pred: 111 %
RV: 2.3 L
TLC % PRED: 85 %
TLC: 3.76 L

## 2015-06-14 NOTE — Assessment & Plan Note (Signed)
Advised at length to quit smoking

## 2015-06-14 NOTE — Progress Notes (Signed)
PFT done today. 

## 2015-06-14 NOTE — Patient Instructions (Signed)
We will arrange a right heart catheterization We will call you with the results of the CT scan  We will see you back after the right heart cath, 6 weeks from now

## 2015-06-14 NOTE — Assessment & Plan Note (Signed)
I worry that she has pulmonary hypertension because she has depressed diffusion capacity on her pulmonary function test and her echocardiogram in July of this year showed an elevated PA pressure. She did not have evidence of significant lung disease otherwise on her pulmonary function testing, i.e. there is no evidence of airflow obstruction.  I explained to her today that given the lack of airflow obstruction and the isolated diffusion defect in the setting of a normal chest x-ray and a normal CT I think that the most likely etiology is pulmonary hypertension.  Plan: Right heart catheterization Follow-up 6 weeks or sooner if needed

## 2015-06-14 NOTE — Progress Notes (Signed)
Subjective:    Patient ID: [name], female    DOB: 1941/07/30, 74 y.o.   MRN: [id number] Synopsis: Refer to [facility] in 2016 for evaluation of shortness of breath. Pulmonary function testing showed an isolated diffusion defect in September 2016, July 2016 echocardiogram suggested pulmonary hypertension. HPI Chief Complaint  Patient presents with  . Follow-up    Pt is here to discuss PFT results. Pt c/o occasional SOB with activity and prod cough white mucus. Wheezing at bedtime.     [name] has been doing about the same since the last visit. She says that she continues to have shortness of breath on exertion and a cough productive of mucus. She does wheeze from time to time. Her pulmonary function testing was performed today. She denies chest tightness or chest pain. No weight loss, fevers, or chills.  Past Medical History  Diagnosis Date  . Hypertension   . GERD (gastroesophageal reflux disease)   . Anxiety   . Hyperlipidemia   . History of GI bleed     With AVM  . Anemia     Iron deficiency anemia  . Shoulder impingement syndrome     Right shoulder  . Aortic stenosis   . CAD (coronary artery disease)     psot bypass x2 to diagonal and obtuse marginal. SVG graft-01/08/2009  . Abdominal pain, unspecified site   . Myocardial infarction 2010  . Peripheral vascular disease   . Hypercholesterolemia   . Iron deficiency anemia   . History of cardiovascular stress test     Myoview 7/16:  Low risk, no ischemia or scar, EF 72%  . History of echocardiogram     Echo 7/16:  EF 55-60%, no RWMA, Gr 2 DD, AVR ok (mean 20 mmHg), PASP 40 mmHg      Review of Systems     Objective:   Physical Exam Filed Vitals:   06/14/15 1339  BP: 108/68  Pulse: 69  Height: 5' (1.524 m)  Weight: 158 lb (71.668 kg)  SpO2: 93%   Gen: well appearing HENT: OP clear, TM's clear, neck supple PULM: CTA B, normal percussion CV: RRR, no mgr, trace edema GI: BS+, soft, nontender Derm:  no cyanosis or rash Psyche: normal mood and affect       Assessment & Plan:  Tobacco use Advised at length to quit smoking   Dyspnea I worry that she has pulmonary hypertension because she has depressed diffusion capacity on her pulmonary function test and her echocardiogram in July of this year showed an elevated PA pressure. She did not have evidence of significant lung disease otherwise on her pulmonary function testing, i.e. there is no evidence of airflow obstruction.  I explained to her today that given the lack of airflow obstruction and the isolated diffusion defect in the setting of a normal chest x-ray and a normal CT I think that the most likely etiology is pulmonary hypertension.  Plan: Right heart catheterization Follow-up 6 weeks or sooner if needed     Current outpatient prescriptions:  .  amLODipine (NORVASC) 10 MG tablet, TAKE 1 TABLET BY MOUTH EVERY DAY, Disp: 30 tablet, Rfl: 3 .  aspirin 81 MG tablet, Take 81 mg by mouth daily., Disp: , Rfl:  .  atorvastatin (LIPITOR) 80 MG tablet, Take 1 tablet (80 mg total) by mouth daily., Disp: 30 tablet, Rfl: 3 .  BYSTOLIC 5 MG tablet, TAKE 1 TABLET BY MOUTH EVERY DAY, Disp: 90 tablet, Rfl: 1 .  Calcium  Carbonate-Vitamin D (CALCIUM 600+D) 600-400 MG-UNIT per tablet, Take 1 tablet by mouth daily., Disp: , Rfl:  .  COD LIVER OIL W/VIT A & D PO, Take by mouth., Disp: , Rfl:  .  ferrous sulfate 325 (65 FE) MG tablet, Take 325 mg by mouth daily with breakfast., Disp: , Rfl:  .  furosemide (LASIX) 20 MG tablet, Take 1 tablet (20 mg total) by mouth as directed. 1 tab for 3 days then STOP, Disp: 30 tablet, Rfl: 0 .  glycopyrrolate (ROBINUL) 2 MG tablet, Take 2 mg by mouth 2 (two) times daily., Disp: , Rfl: 1 .  lisinopril-hydrochlorothiazide (PRINZIDE,ZESTORETIC) 10-12.5 MG per tablet, TAKE 1 TABLET BY MOUTH EVERY DAY, Disp: 90 tablet, Rfl: 1 .  liver oil-zinc oxide (DESITIN) 40 % ointment, Apply topically as needed., Disp: , Rfl:    .  LORazepam (ATIVAN) 0.5 MG tablet, Take 0.5 mg by mouth at bedtime as needed., Disp: , Rfl:  .  meloxicam (MOBIC) 15 MG tablet, Take 15 mg by mouth daily., Disp: , Rfl: 0 .  Multiple Vitamin (MULTIVITAMIN) tablet, Take 1 tablet by mouth daily., Disp: , Rfl:  .  omeprazole (PRILOSEC) 40 MG capsule, Take 40 mg by mouth daily., Disp: , Rfl:  .  potassium chloride (K-DUR) 10 MEQ tablet, Take 2 tablets (20 mEq total) by mouth 2 (two) times daily., Disp: 120 tablet, Rfl: 3 .  sertraline (ZOLOFT) 50 MG tablet, Take 100 mg by mouth daily. , Disp: , Rfl:  .  sucralfate (CARAFATE) 1 G tablet, TAKE 1 TABLET BY MOUTH 4 TIMES DAILY - WITH MEALS AND AT BEDTIME., Disp: 90 tablet, Rfl: 1 .  traMADol (ULTRAM) 50 MG tablet, Take 1 tablet (50 mg total) by mouth every 6 (six) hours as needed., Disp: 15 tablet, Rfl: 0

## 2015-06-15 ENCOUNTER — Other Ambulatory Visit: Payer: Self-pay | Admitting: Acute Care

## 2015-06-15 DIAGNOSIS — F17210 Nicotine dependence, cigarettes, uncomplicated: Principal | ICD-10-CM

## 2015-06-19 ENCOUNTER — Ambulatory Visit (AMBULATORY_SURGERY_CENTER): Payer: Medicare Other | Admitting: Gastroenterology

## 2015-06-19 ENCOUNTER — Encounter: Payer: Self-pay | Admitting: Gastroenterology

## 2015-06-19 VITALS — BP 136/78 | HR 53 | Temp 98.0°F | Resp 22 | Ht 60.0 in | Wt 158.0 lb

## 2015-06-19 DIAGNOSIS — K2990 Gastroduodenitis, unspecified, without bleeding: Secondary | ICD-10-CM

## 2015-06-19 DIAGNOSIS — R197 Diarrhea, unspecified: Secondary | ICD-10-CM | POA: Diagnosis not present

## 2015-06-19 DIAGNOSIS — K529 Noninfective gastroenteritis and colitis, unspecified: Secondary | ICD-10-CM | POA: Diagnosis not present

## 2015-06-19 DIAGNOSIS — R1310 Dysphagia, unspecified: Secondary | ICD-10-CM

## 2015-06-19 DIAGNOSIS — K2970 Gastritis, unspecified, without bleeding: Secondary | ICD-10-CM

## 2015-06-19 DIAGNOSIS — K2950 Unspecified chronic gastritis without bleeding: Secondary | ICD-10-CM | POA: Diagnosis not present

## 2015-06-19 MED ORDER — SODIUM CHLORIDE 0.9 % IV SOLN: 500.0000 mL | INTRAVENOUS | Status: DC

## 2015-06-19 NOTE — Progress Notes (Signed)
Called to room to assist during endoscopic procedure.  Patient ID and intended procedure confirmed with present staff. Received instructions for my participation in the procedure from the performing physician.  

## 2015-06-19 NOTE — Patient Instructions (Signed)
Biopsies taken in colon and stomach. Waiting on results,a letter in your mail in 1-2 weeks.  For now eat slowly, take small bites and chew your food very well.  YOU HAD AN ENDOSCOPIC PROCEDURE TODAY AT [facility]:   Refer to the procedure report that was given to you for any specific questions about what was found during the examination.  If the procedure report does not answer your questions, please call your gastroenterologist to clarify.  If you requested that your care partner not be given the details of your procedure findings, then the procedure report has been included in a sealed envelope for you to review at your convenience later.  YOU SHOULD EXPECT: Some feelings of bloating in the abdomen. Passage of more gas than usual.  Walking can help get rid of the air that was put into your GI tract during the procedure and reduce the bloating. If you had a lower endoscopy (such as a colonoscopy or flexible sigmoidoscopy) you may notice spotting of blood in your stool or on the toilet paper. If you underwent a bowel prep for your procedure, you may not have a normal bowel movement for a few days.  Please Note:  You might notice some irritation and congestion in your nose or some drainage.  This is from the oxygen used during your procedure.  There is no need for concern and it should clear up in a day or so.  SYMPTOMS TO REPORT IMMEDIATELY:   Following lower endoscopy (colonoscopy or flexible sigmoidoscopy):  Excessive amounts of blood in the stool  Significant tenderness or worsening of abdominal pains  Swelling of the abdomen that is new, acute  Fever of 100F or higher   Following upper endoscopy (EGD)  Vomiting of blood or coffee ground material  New chest pain or pain under the shoulder blades  Painful or persistently difficult swallowing  New shortness of breath  Fever of 100F or higher  Black, tarry-looking stools  For urgent or emergent issues, a  gastroenterologist can be reached at any hour by calling [phone].   DIET: Your first meal following the procedure should be a small meal and then it is ok to progress to your normal diet. Heavy or fried foods are harder to digest and may make you feel nauseous or bloated.  Likewise, meals heavy in dairy and vegetables can increase bloating.  Drink plenty of fluids but you should avoid alcoholic beverages for 24 hours.  ACTIVITY:  You should plan to take it easy for the rest of today and you should NOT DRIVE or use heavy machinery until tomorrow (because of the sedation medicines used during the test).    FOLLOW UP: Our staff will call the number listed on your records the next business day following your procedure to check on you and address any questions or concerns that you may have regarding the information given to you following your procedure. If we do not reach you, we will leave a message.  However, if you are feeling well and you are not experiencing any problems, there is no need to return our call.  We will assume that you have returned to your regular daily activities without incident.  If any biopsies were taken you will be contacted by phone or by letter within the next 1-3 weeks.  Please call us at [phone] if you have not heard about the biopsies in 3 weeks.    SIGNATURES/CONFIDENTIALITY: You and/or your care partner have  signed paperwork which will be entered into your electronic medical record.  These signatures attest to the fact that that the information above on your After Visit Summary has been reviewed and is understood.  Full responsibility of the confidentiality of this discharge information lies with you and/or your care-partner.

## 2015-06-19 NOTE — Op Note (Signed)
[place]  [address] [place], [address]   COLONOSCOPY PROCEDURE REPORT  PATIENT: Adrienne, Weeks  MR#: [id number] BIRTHDATE: March 21, 1941 , 74  yrs. old GENDER: female ENDOSCOPIST: Milus Banister, MD REFERRED OI:[id number] Sun, M.D. PROCEDURE DATE:  06/19/2015 PROCEDURE:   Colonoscopy, diagnostic and Colonoscopy with biopsy First Screening Colonoscopy - Avg.  risk and is 50 yrs.  old or older - No.  Prior Negative Screening - Now for repeat screening. N/A  History of Adenoma - Now for follow-up colonoscopy & has been > or = to 3 yrs.  N/A  poor prep ASA CLASS:   Class II INDICATIONS:loose stools. MEDICATIONS: Monitored anesthesia care and Propofol 150 mg IV  DESCRIPTION OF PROCEDURE:   After the risks benefits and alternatives of the procedure were thoroughly explained, informed consent was obtained.  The digital rectal exam revealed no abnormalities of the rectum.   The LB [id number]  endoscope was introduced through the anus and advanced to the cecum, which was identified by both the appendix and ileocecal valve. No adverse events experienced.   The quality of the prep was poor.  The instrument was then slowly withdrawn as the colon was fully examined. Estimated blood loss is zero unless otherwise noted in this procedure report.   COLON FINDINGS: The prep was fairly poor with a large amount of remaining liquid stool, prep.  The visualized mucosa was normal and was biopsied randomly.  Small lesions, polyps may have been missed given the poor prep.  The examination was otherwise normal. Retroflexed views revealed no abnormalities. The time to cecum = 1.2 Withdrawal time = 8.0   The scope was withdrawn and the procedure completed. COMPLICATIONS: There were no immediate complications.  ENDOSCOPIC IMPRESSION: The prep was fairly poor with a large amount of remaining liquid stool, prep.  The visualized mucosa was normal and was biopsied randomly.   Small lesions, polyps may have been missed given the poor prep.  The examination was otherwise normal  RECOMMENDATIONS: Await final pathology results.  Given the poor prep, you will likely need repeat colonoscopy for colon cancer screening in next 3-6 months.  eSigned:  Milus Banister, MD 06/19/2015 1:43 PM

## 2015-06-19 NOTE — Progress Notes (Signed)
Transferred to recovery room. A/O x3, pleased with MAC.  VSS.  Report to [place], Therapist, sports.

## 2015-06-19 NOTE — Op Note (Signed)
[place]  [address]. [place], [address]   ENDOSCOPY PROCEDURE REPORT  PATIENT: [name], [name]  MR#: [id number] BIRTHDATE: 03-29-1941 , 74  yrs. old GENDER: female ENDOSCOPIST: Milus Banister, MD REFERRED BY:  Donald Prose, M.D. PROCEDURE DATE:  06/19/2015 PROCEDURE:  EGD w/ biopsy ASA CLASS:     Class II INDICATIONS:  intermittent dysphagia to solids; no weight loss. MEDICATIONS: Monitored anesthesia care and Propofol 100 mg IV TOPICAL ANESTHETIC: none  DESCRIPTION OF PROCEDURE: After the risks benefits and alternatives of the procedure were thoroughly explained, informed consent was obtained.  The LB [id number] endoscope was introduced through the mouth and advanced to the second portion of the duodenum , Without limitations.  The instrument was slowly withdrawn as the mucosa was fully examined.  There was mild to moderate non-specific distal gastritis.  This was biopsied and sent to pathology.  The examination was otherwise normal including normal appearing GE junction.  Retroflexed views revealed no abnormalities.     The scope was then withdrawn from the patient and the procedure completed. COMPLICATIONS: There were no immediate complications.  ENDOSCOPIC IMPRESSION: There was mild to moderate non-specific distal gastritis.  This was biopsied and sent to pathology.  The examination was otherwise normal including normal appearing GE junction  RECOMMENDATIONS: Await final pathology results.  For now please try to eat slowly, take small bites and chew your food very well.   eSigned:  Milus Banister, MD 06/19/2015 1:52 PM

## 2015-06-20 ENCOUNTER — Telehealth: Payer: Self-pay | Admitting: Emergency Medicine

## 2015-06-20 NOTE — Telephone Encounter (Signed)
  Follow up Call-  Call back number 06/19/2015  Post procedure Call Back phone  # [phone]  Permission to leave phone message Yes     Patient questions:  Do you have a fever, pain , or abdominal swelling? No. Pain Score  0 *  Have you tolerated food without any problems? Yes.    Have you been able to return to your normal activities? Yes.    Do you have any questions about your discharge instructions: Diet   No. Medications  No. Follow up visit  No.  Do you have questions or concerns about your Care? No.  Actions: * If pain score is 4 or above: No action needed, pain <4.

## 2015-06-28 ENCOUNTER — Encounter: Payer: Self-pay | Admitting: Acute Care

## 2015-06-28 ENCOUNTER — Ambulatory Visit (INDEPENDENT_AMBULATORY_CARE_PROVIDER_SITE_OTHER)
Admission: RE | Admit: 2015-06-28 | Discharge: 2015-06-28 | Disposition: A | Discharge status: Home / Self Care | Payer: Medicare Other | Source: Ambulatory Visit | Attending: Acute Care | Admitting: Acute Care

## 2015-06-28 ENCOUNTER — Telehealth: Payer: Self-pay | Admitting: Acute Care

## 2015-06-28 ENCOUNTER — Ambulatory Visit (INDEPENDENT_AMBULATORY_CARE_PROVIDER_SITE_OTHER): Payer: Medicare Other | Admitting: Acute Care

## 2015-06-28 DIAGNOSIS — F17210 Nicotine dependence, cigarettes, uncomplicated: Secondary | ICD-10-CM

## 2015-06-28 NOTE — Progress Notes (Signed)
Shared Decision Making Visit Lung Cancer Screening Program [phone])   Eligibility:  Age 74 y.o.  Pack Years Smoking History Calculation: 52 pack years (# packs/per year x # years smoked)  Recent History of coughing up blood  no  Unexplained weight loss? no ( >Than 15 pounds within the last 6 months )  Prior History Lung / other cancer no (Diagnosis within the last 5 years already requiring surveillance chest CT Scans).  Smoking Status Current Smoker  Former Smokers: Years since quit:NA  Quit Date: NA  Visit Components:  Discussion included one or more decision making aids. yes  Discussion included risk/benefits of screening. yes  Discussion included potential follow up diagnostic testing for abnormal scans. yes  Discussion included meaning and risk of over diagnosis. yes  Discussion included meaning and risk of False Positives. yes  Discussion included meaning of total radiation exposure. yes  Counseling Included:  Importance of adherence to annual lung cancer LDCT screening. yes  Impact of comorbidities on ability to participate in the program. yes  Ability and willingness to under diagnostic treatment. yes  Smoking Cessation Counseling:  Current Smokers:   Discussed importance of smoking cessation. yes  Information about tobacco cessation classes and interventions provided to patient. yes  Patient provided with "ticket" for LDCT Scan. yes  Symptomatic Patient. no  Counseling:NA  Diagnosis Code: Tobacco Use Z72.0  Asymptomatic Patient yes  Counseling (Intermediate counseling: > three minutes counseling) W8088  Former Smokers:   Discussed the importance of maintaining cigarette abstinence.NA  Diagnosis Code: Personal History of Nicotine Dependence. P10.315  Information about tobacco cessation classes and interventions provided to patient. Yes  Patient provided with "ticket" for LDCT Scan. yes  Written Order for Lung Cancer Screening with LDCT  placed in Epic. Yes (CT Chest Lung Cancer Screening Low Dose W/O CM) [id number] Z12.2-Screening of respiratory organs Z87.891-Personal history of nicotine dependence  I spent 15 minutes of face to face time with [name] explaining the risks and benefits of lung cancer screening. We viewed a power point together reviewing the above noted topics, pausing at intervals to allow for questions to be asked and answered to ensure understanding. We discussed that the single most powerful action that she can take to decrease her risk of lung cancer is to quit smoking. She told me that she did quit for a period of 12 months when she had her open heart surgery. She did this cold [place]. She says she is ready to quit, but has not set a quit date. We talked about nicotine replacement therapy, medication and behavior modification in addition to classes to help support her quit goal. I have told her to let me know when she has decided to set her quit date, and if she would like nicotine replacement and medication to help. I gave her the " Be stronger than your excuses" card with community and national resources and their contact numbers in addition to my contact information. She said she will also speak with her PCP about helping her when she is ready. We did discuss setting small goals to smoke 1 less cigarette per day for a week, and then 2 less per week, because sometimes setting small goals seems more attainable. We discussed the time and location of her scan, and that I will call her within 48 hours to give her the results. She verbalized understanding of the above and had no further questions upon leaving the office.   Magdalen Spatz, NP

## 2015-06-28 NOTE — Telephone Encounter (Signed)
I called [name] with her CT results. I explained that her screening scan resulted as a lung rads 2, which means she has some small benign appearing nodules that are not concerning for cancer at this time. I told her that the radiologist recommends her next scan be done in 12 months to allow [place] to monitor for changes over time. I told her we would call and schedule her for the screening about 3 weeks before it is due in Oct. 2017. She verbalized understanding of all of the above and had no further questions. I have also messaged Dr. Lake Bells with the results of the scan in addition to the incidental findings noted in the exam. The patient had appointments scheduled with cardiology 07/10/15 and Pulmonary 07/27/15.

## 2015-07-10 ENCOUNTER — Ambulatory Visit (INDEPENDENT_AMBULATORY_CARE_PROVIDER_SITE_OTHER): Payer: Medicare Other | Admitting: Cardiology

## 2015-07-10 ENCOUNTER — Encounter: Payer: Self-pay | Admitting: Cardiology

## 2015-07-10 VITALS — BP 118/84 | HR 64 | Ht 60.0 in | Wt 157.0 lb

## 2015-07-10 DIAGNOSIS — F17200 Nicotine dependence, unspecified, uncomplicated: Secondary | ICD-10-CM

## 2015-07-10 DIAGNOSIS — Z8719 Personal history of other diseases of the digestive system: Secondary | ICD-10-CM | POA: Diagnosis not present

## 2015-07-10 DIAGNOSIS — Z954 Presence of other heart-valve replacement: Secondary | ICD-10-CM

## 2015-07-10 DIAGNOSIS — I10 Essential (primary) hypertension: Secondary | ICD-10-CM | POA: Diagnosis not present

## 2015-07-10 DIAGNOSIS — I25810 Atherosclerosis of coronary artery bypass graft(s) without angina pectoris: Secondary | ICD-10-CM

## 2015-07-10 DIAGNOSIS — Z952 Presence of prosthetic heart valve: Secondary | ICD-10-CM

## 2015-07-10 NOTE — Patient Instructions (Signed)
Medication Instructions:  The current medical regimen is effective;  continue present plan and medications. You may use Coricidin HBP for cold signs and symptoms as needed.  Follow-Up: Follow up in 6 months with Richardson Dopp, PA.  You will receive a letter in the mail 2 months before you are due.  Please call us when you receive this letter to schedule your follow up appointment.  Thank you for choosing [facility]!!

## 2015-07-10 NOTE — Progress Notes (Signed)
[name]. [address], [place]  [address] Phone: [phone] Fax:  [phone]  Date:  07/10/2015   ID:  [name], DOB 1941/06/29, MRN [id number]  PCP:  [name], MD   History of Present Illness: [name] is a 74 y.o. female with coronary artery disease status post bypass x2- grafts to diagonal and obtuse marginal 12/29/08, aortic valve replacement with bioprosthetic #21 valve, hypertension, AVM with prior GI bleed (no futher post AVR)  here for followup.  Echocardiogram  demonstrated bioprosthetic aortic valve velocity of 3.0 m/s which is upper limits of normal as well as ascending root dilated aorta of 4.3 cm. Her overall ejection fraction was normal.  In regards to lipids, LDL has been under good control with atorvastatin. Prior lab work reviewed. Previous creatinine 0.6.  Feels tired and breathing faster.   She has had continued somatic complaints, aches and pains she states. No exertional anginal symptoms. Left lateral leg pain and we continued to express the importance of exercise and conditioning. We have tried Bystolic instead of metoprolol because of previously described fatigue.  Holter monitor in May of 2015 demonstrated no significant bradycardia, rare PACs. No new medications needed.  Having some dysphagia, food getting stuck. Continues to have some shortness of breath with activity. Continues to smoke.   Wt Readings from Last 3 Encounters:  07/10/15 157 lb (71.215 kg)  06/19/15 158 lb (71.668 kg)  06/14/15 158 lb (71.668 kg)     Past Medical History  Diagnosis Date  . Hypertension   . GERD (gastroesophageal reflux disease)   . Anxiety   . Hyperlipidemia   . History of GI bleed     With AVM  . Anemia     Iron deficiency anemia  . Shoulder impingement syndrome     Right shoulder  . Aortic stenosis   . CAD (coronary artery disease)     psot bypass x2 to diagonal and obtuse marginal. SVG graft-01/08/2009  . Abdominal  pain, unspecified site   . Myocardial infarction ([place]) 2010  . Peripheral vascular disease ([place])   . Hypercholesterolemia   . Iron deficiency anemia   . History of cardiovascular stress test     Myoview 7/16:  Low risk, no ischemia or scar, EF 72%  . History of echocardiogram     Echo 7/16:  EF 55-60%, no RWMA, Gr 2 DD, AVR ok (mean 20 mmHg), PASP 40 mmHg    Past Surgical History  Procedure Laterality Date  . Aortic valve replacement      [place]wards #21  . Cholecystectomy    . Spine surgery  2003    Herniated diskectomy  . Wrist surgery      right wrist  . Heel spur excision  2000    left heel  . Coronary artery bypass graft    . Cardiac surgery  2010    , 2 blockages  . Abdominal hysterectomy      Current Outpatient Prescriptions  Medication Sig Dispense Refill  . amLODipine (NORVASC) 10 MG tablet TAKE 1 TABLET BY MOUTH EVERY DAY 30 tablet 3  . aspirin 81 MG tablet Take 81 mg by mouth daily.    Marland Kitchen atorvastatin (LIPITOR) 80 MG tablet Take 1 tablet (80 mg total) by mouth daily. 30 tablet 3  . BYSTOLIC 5 MG tablet TAKE 1 TABLET BY MOUTH EVERY DAY 90 tablet 1  . Calcium Carbonate-Vitamin D (CALCIUM 600+D) 600-400 MG-UNIT per tablet Take  1 tablet by mouth daily.    . COD LIVER OIL W/VIT A & D PO Take by mouth.    . ferrous sulfate 325 (65 FE) MG tablet Take 325 mg by mouth daily with breakfast.    . lisinopril-hydrochlorothiazide (PRINZIDE,ZESTORETIC) 10-12.5 MG per tablet TAKE 1 TABLET BY MOUTH EVERY DAY 90 tablet 1  . liver oil-zinc oxide (DESITIN) 40 % ointment Apply topically as needed.    Marland Kitchen LORazepam (ATIVAN) 0.5 MG tablet Take 0.5 mg by mouth at bedtime as needed.    . meloxicam (MOBIC) 15 MG tablet Take 15 mg by mouth daily.  0  . Multiple Vitamin (MULTIVITAMIN) tablet Take 1 tablet by mouth daily.    Marland Kitchen omeprazole (PRILOSEC) 40 MG capsule Take 40 mg by mouth daily.    . potassium chloride (K-DUR) 10 MEQ tablet Take 2 tablets (20 mEq total) by mouth 2 (two) times daily. 120  tablet 3  . sertraline (ZOLOFT) 50 MG tablet Take 100 mg by mouth daily.     . sucralfate (CARAFATE) 1 G tablet TAKE 1 TABLET BY MOUTH 4 TIMES DAILY - WITH MEALS AND AT BEDTIME. 90 tablet 1  . furosemide (LASIX) 20 MG tablet Take 1 tablet (20 mg total) by mouth as directed. 1 tab for 3 days then STOP (Patient not taking: Reported on 07/10/2015) 30 tablet 0  . glycopyrrolate (ROBINUL) 2 MG tablet Take 2 mg by mouth 2 (two) times daily.  1  . traMADol (ULTRAM) 50 MG tablet Take 1 tablet (50 mg total) by mouth every 6 (six) hours as needed. (Patient not taking: Reported on 07/10/2015) 15 tablet 0   No current facility-administered medications for this visit.    Allergies:    Allergies  Allergen Reactions  . Codeine Other (See Comments)    Feeling intoxicated    Social History:  The patient  reports that she has been smoking Cigarettes.  She has a 52 pack-year smoking history. She has never used smokeless tobacco. She reports that she does not drink alcohol or use illicit drugs.   ROS:  Please see the history of present illness.   Denies any syncope, bleeding, orthopnea, PND.  Positive for fatigue, chest pain, leg swelling, shortness of breath, back pain, abdominal pain, blood in stool, cough.   PHYSICAL EXAM: VS:  BP 118/84 mmHg  Pulse 64  Ht 5' (1.524 m)  Wt 157 lb (71.215 kg)  BMI 30.66 kg/m2  SpO2 97% Well nourished, well developed, in no acute distress HEENT: normal Neck: no JVD, radiation of aortic murmur Cardiac:  normal S1, S2; RRR; 2/6 systolic murmur Right upper sternal border. Scar noted. Lungs:  clear to auscultation bilaterally, no wheezing, rhonchi or rales Abd: soft, nontender, no hepatomegaly Ext: no edema Skin: warm and dry Neuro: no focal abnormalities noted  EKG: 07/14/14-sinus rhythm, minimal sinus arrhythmia, no other abnormalities. Heart rate 64.0 06/28/2013-sinus rhythm rate 55, poor R wave progression, nonspecific ST-T wave changes, old septal infarct  pattern. No significant change from prior.  ECHO : 07/14/13 - - Left ventricle: There was mild focal basal hypertrophy of the septum. Systolic function was vigorous. The estimated ejection fraction was in the range of 65% to 70%. Wall motion was normal; there were no regional wall motion abnormalities. Doppler parameters are consistent with abnormal left ventricular relaxation (grade 1 diastolic dysfunction). - Aortic valve: A prosthesis was present and functioning normally. The prosthesis had a normal range of motion. The sewing ring appeared normal, had no rocking  motion, and showed no evidence of dehiscence. Peak velocity: 338cm/s (S) , elevated for bioprosthetic valve. Prior echo 06/11/12 - Peak vel 3.63ms. (Discussed with Dr. BPablo Ledger.  - Mitral valve: Calcified annulus.  Echo 03/30/15-normal EF, normal functioning aortic valve replacement, and aortic root size is normal.    LABS: 06/28/13 - LDL 67.  ASSESSMENT AND PLAN:  1. Coronary artery disease status post bypass-currently overall doing well with no exertional anginal symptoms. Still has baseline shortness of breath. Continue with aspirin., Beta blocker, statin, ACE inhibitor. Previous bypass anatomy reviewed. Secondary prevention. 2. Dyspnea-Dr. MAnastasia Pallnote reviewed. No evidence of obstruction on pulmonary function test. She does have occasional wheezing she states. Her air movement does seem decreased on physical exam by auscultation. He believes that pulmonary hypertension may be playing a role. Her previous measurement was 40 mmHg, mild. Smoking sensation nonetheless is key as well as blood pressure control. Gave her recommendation for Coricidin HBP for colds. 3. Aortic valve replacement-upper normal velocity on echocardiogram. Continue to monitor. Murmur appreciated on exam. Discussed with Dr. BCyndia Bent 4. AVM-prior GI bleed. Once aortic valve replaced, no further bleeding episodes. This can be related to lack of  degradation of von Willebrand's factor. She understands to take dental prophylaxis antibiotics. 5. Hypertension essential-under great control. No changes made. Renal function has been stable, 0.7 creatinine.  6. Dysphagia-had endoscopies with Dr. JArdis Hughs Mild gastritis noted. Recommended to chew food well, small bites. 7. Hyperlipidemia- Overall good control. High-dose statin. 8. Tobacco use-encourage tobacco cessation. 5 cigs a day. She has not taking any medications such as Chantix. She is worried about trying this type of medication. I counseled her on picking a stop date, trying to control cravings. 9. Obesity-encourage weight loss. Discussed BMI with her. Decreasing carbohydrates. Diet. Hard for her to exercise. 10. Dilated aortic root-we will continue to monitor with echocardiogram.  11. 6 month follow  Signed, MCandee Furbish MD [facility] 07/10/2015 10:42 AM

## 2015-07-12 ENCOUNTER — Emergency Department (HOSPITAL_COMMUNITY)
Admission: EM | Admit: 2015-07-12 | Discharge: 2015-07-12 | Disposition: A | Discharge status: Home / Self Care | Payer: No Typology Code available for payment source | Attending: Emergency Medicine | Admitting: Emergency Medicine

## 2015-07-12 ENCOUNTER — Emergency Department (HOSPITAL_COMMUNITY): Payer: No Typology Code available for payment source

## 2015-07-12 ENCOUNTER — Encounter (HOSPITAL_COMMUNITY): Payer: Self-pay

## 2015-07-12 DIAGNOSIS — F419 Anxiety disorder, unspecified: Secondary | ICD-10-CM | POA: Diagnosis not present

## 2015-07-12 DIAGNOSIS — Y9389 Activity, other specified: Secondary | ICD-10-CM | POA: Insufficient documentation

## 2015-07-12 DIAGNOSIS — I252 Old myocardial infarction: Secondary | ICD-10-CM | POA: Diagnosis not present

## 2015-07-12 DIAGNOSIS — Z720 Tobacco use: Secondary | ICD-10-CM | POA: Insufficient documentation

## 2015-07-12 DIAGNOSIS — Y92410 Unspecified street and highway as the place of occurrence of the external cause: Secondary | ICD-10-CM | POA: Diagnosis not present

## 2015-07-12 DIAGNOSIS — S161XXA Strain of muscle, fascia and tendon at neck level, initial encounter: Secondary | ICD-10-CM | POA: Insufficient documentation

## 2015-07-12 DIAGNOSIS — Z8739 Personal history of other diseases of the musculoskeletal system and connective tissue: Secondary | ICD-10-CM | POA: Insufficient documentation

## 2015-07-12 DIAGNOSIS — Z951 Presence of aortocoronary bypass graft: Secondary | ICD-10-CM | POA: Diagnosis not present

## 2015-07-12 DIAGNOSIS — I10 Essential (primary) hypertension: Secondary | ICD-10-CM | POA: Insufficient documentation

## 2015-07-12 DIAGNOSIS — Y998 Other external cause status: Secondary | ICD-10-CM | POA: Diagnosis not present

## 2015-07-12 DIAGNOSIS — E7800 Pure hypercholesterolemia, unspecified: Secondary | ICD-10-CM | POA: Diagnosis not present

## 2015-07-12 DIAGNOSIS — Z791 Long term (current) use of non-steroidal anti-inflammatories (NSAID): Secondary | ICD-10-CM | POA: Diagnosis not present

## 2015-07-12 DIAGNOSIS — Z7982 Long term (current) use of aspirin: Secondary | ICD-10-CM | POA: Insufficient documentation

## 2015-07-12 DIAGNOSIS — D509 Iron deficiency anemia, unspecified: Secondary | ICD-10-CM | POA: Diagnosis not present

## 2015-07-12 DIAGNOSIS — K219 Gastro-esophageal reflux disease without esophagitis: Secondary | ICD-10-CM | POA: Insufficient documentation

## 2015-07-12 DIAGNOSIS — I2510 Atherosclerotic heart disease of native coronary artery without angina pectoris: Secondary | ICD-10-CM | POA: Diagnosis not present

## 2015-07-12 DIAGNOSIS — M79604 Pain in right leg: Secondary | ICD-10-CM

## 2015-07-12 DIAGNOSIS — S8991XA Unspecified injury of right lower leg, initial encounter: Secondary | ICD-10-CM | POA: Diagnosis not present

## 2015-07-12 DIAGNOSIS — Z79899 Other long term (current) drug therapy: Secondary | ICD-10-CM | POA: Diagnosis not present

## 2015-07-12 MED ORDER — TRAMADOL HCL 50 MG PO TABS: 50.0000 mg | ORAL_TABLET | Freq: Four times a day (QID) | ORAL | Status: DC | PRN
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
## 2015-07-19 ENCOUNTER — Other Ambulatory Visit: Payer: Self-pay | Admitting: Physician Assistant

## 2015-07-27 ENCOUNTER — Ambulatory Visit (INDEPENDENT_AMBULATORY_CARE_PROVIDER_SITE_OTHER): Payer: Medicare Other | Admitting: Pulmonary Disease

## 2015-07-27 ENCOUNTER — Encounter: Payer: Self-pay | Admitting: Pulmonary Disease

## 2015-07-27 VITALS — BP 132/70 | HR 77 | Ht 60.0 in | Wt 157.0 lb

## 2015-07-27 DIAGNOSIS — R0600 Dyspnea, unspecified: Secondary | ICD-10-CM

## 2015-07-27 NOTE — Assessment & Plan Note (Signed)
[name] has clear lungs on exam today, lung function testing that shows normal total lung capacity, no airflow obstruction, but a markedly decreased diffusion capacity in the face of a normal hemoglobin. Echocardiogram suggest pulmonary hypertension.  From my standpoint there is no clear evidence of lung disease but I'm very concerned about the possibility of pulmonary arterial hypertension. This may be somehow related to her prior valve disease and she may have diastolic heart failure which is undiagnosed. However, difficult to sort this out without a right heart cath.  Plan: I discussed her case with Dr. Kingsley Plan today She will hear from the cardiology office as we are planning for a right heart catheterization F/u 6-8 weeks or sooner if needed

## 2015-07-27 NOTE — Progress Notes (Signed)
Subjective:    Patient ID: [name], female    DOB: October 13, 1940, 74 y.o.   MRN: [id number] Synopsis: Refer to [facility] in 2016 for evaluation of shortness of breath. Pulmonary function testing showed an isolated diffusion defect in September 2016, July 2016 echocardiogram suggested pulmonary hypertension.  HPI Chief Complaint  Patient presents with  . Follow-up    pt states her SOB, prod cough with white mucus is unchanged.     [name] states that she still has some shortness of breath. Specifically, she has trouble climbing a flight of stairs or carrying something heavy. She has minimal cough. She doesn't have chest congestion. She has only had small amount of leg swelling. She denies chest pain. She saw cardiology where they reviewed her most recent echocardiogram results.   Past Medical History  Diagnosis Date  . Hypertension   . GERD (gastroesophageal reflux disease)   . Anxiety   . Hyperlipidemia   . History of GI bleed     With AVM  . Anemia     Iron deficiency anemia  . Shoulder impingement syndrome     Right shoulder  . Aortic stenosis   . CAD (coronary artery disease)     psot bypass x2 to diagonal and obtuse marginal. SVG graft-01/08/2009  . Abdominal pain, unspecified site   . Myocardial infarction ([place]) 2010  . Peripheral vascular disease ([place])   . Hypercholesterolemia   . Iron deficiency anemia   . History of cardiovascular stress test     Myoview 7/16:  Low risk, no ischemia or scar, EF 72%  . History of echocardiogram     Echo 7/16:  EF 55-60%, no RWMA, Gr 2 DD, AVR ok (mean 20 mmHg), PASP 40 mmHg      Review of Systems  Constitutional: Negative for fever, chills and fatigue.  HENT: Negative for postnasal drip, rhinorrhea and sinus pressure.   Respiratory: Positive for shortness of breath. Negative for cough and wheezing.   Cardiovascular: Positive for leg swelling. Negative for chest pain and palpitations.       Objective:   Physical  Exam Filed Vitals:   07/27/15 1640  BP: 132/70  Pulse: 77  Height: 5' (1.524 m)  Weight: 157 lb (71.215 kg)  SpO2: 99%   Gen: well appearing HENT: OP clear, TM's clear, neck supple PULM: CTA B, normal percussion CV: RRR, no mgr, trace edema GI: BS+, soft, nontender Derm: no cyanosis or rash Psyche: normal mood and affect  Cardiology records reviewed where she was seen for her coronary artery disease, and aortic valvular disease July 2016 echocardiogram shows slightly elevated PA pressure, upper limit of normal aortic valve gradient July 2016 hemoglobin 14.4     Assessment & Plan:  Dyspnea Torryn has clear lungs on exam today, lung function testing that shows normal total lung capacity, no airflow obstruction, but a markedly decreased diffusion capacity in the face of a normal hemoglobin. Echocardiogram suggest pulmonary hypertension.  From my standpoint there is no clear evidence of lung disease but I'm very concerned about the possibility of pulmonary arterial hypertension. This may be somehow related to her prior valve disease and she may have diastolic heart failure which is undiagnosed. However, difficult to sort this out without a right heart cath.  Plan: I discussed her case with Dr. Kingsley Plan today She will hear from the cardiology office as we are planning for a right heart catheterization F/u 6-8 weeks or sooner if needed  Current outpatient prescriptions:  .  amLODipine (NORVASC) 10 MG tablet, TAKE 1 TABLET BY MOUTH EVERY DAY, Disp: 30 tablet, Rfl: 3 .  aspirin 81 MG tablet, Take 81 mg by mouth daily., Disp: , Rfl:  .  atorvastatin (LIPITOR) 80 MG tablet, Take 1 tablet (80 mg total) by mouth daily., Disp: 30 tablet, Rfl: 3 .  BYSTOLIC 5 MG tablet, TAKE 1 TABLET BY MOUTH EVERY DAY, Disp: 90 tablet, Rfl: 1 .  Calcium Carbonate-Vitamin D (CALCIUM 600+D) 600-400 MG-UNIT per tablet, Take 1 tablet by mouth daily., Disp: , Rfl:  .  COD LIVER OIL W/VIT A & D PO, Take by  mouth., Disp: , Rfl:  .  ferrous sulfate 325 (65 FE) MG tablet, Take 325 mg by mouth daily with breakfast., Disp: , Rfl:  .  glycopyrrolate (ROBINUL) 2 MG tablet, Take 2 mg by mouth 2 (two) times daily., Disp: , Rfl: 1 .  lisinopril-hydrochlorothiazide (PRINZIDE,ZESTORETIC) 10-12.5 MG per tablet, TAKE 1 TABLET BY MOUTH EVERY DAY, Disp: 90 tablet, Rfl: 1 .  liver oil-zinc oxide (DESITIN) 40 % ointment, Apply topically as needed., Disp: , Rfl:  .  LORazepam (ATIVAN) 0.5 MG tablet, Take 0.5 mg by mouth at bedtime as needed., Disp: , Rfl:  .  meloxicam (MOBIC) 15 MG tablet, Take 15 mg by mouth daily., Disp: , Rfl: 0 .  Multiple Vitamin (MULTIVITAMIN) tablet, Take 1 tablet by mouth daily., Disp: , Rfl:  .  omeprazole (PRILOSEC) 40 MG capsule, Take 40 mg by mouth daily., Disp: , Rfl:  .  potassium chloride (MICRO-K) 10 MEQ CR capsule, TAKE 2 CAPSULES BY MOUTH TWICE A DAY, Disp: 120 capsule, Rfl: 11 .  sertraline (ZOLOFT) 50 MG tablet, Take 100 mg by mouth daily. , Disp: , Rfl:  .  sucralfate (CARAFATE) 1 G tablet, TAKE 1 TABLET BY MOUTH 4 TIMES DAILY - WITH MEALS AND AT BEDTIME., Disp: 90 tablet, Rfl: 1 .  traMADol (ULTRAM) 50 MG tablet, Take 1 tablet (50 mg total) by mouth every 6 (six) hours as needed., Disp: 10 tablet, Rfl: 0 .  [DISCONTINUED] furosemide (LASIX) 20 MG tablet, Take 1 tablet (20 mg total) by mouth as directed. 1 tab for 3 days then STOP (Patient not taking: Reported on 07/10/2015), Disp: 30 tablet, Rfl: 0

## 2015-07-27 NOTE — Patient Instructions (Signed)
Dr. Kingsley Plan office will contact you for a right heart catheterization I will see you back in 6 weeks or sooner if needed

## 2015-08-01 ENCOUNTER — Telehealth: Payer: Self-pay | Admitting: Cardiology

## 2015-08-01 NOTE — Telephone Encounter (Signed)
Left message for pt to call back to schedule cardiac cath.

## 2015-08-01 NOTE — Telephone Encounter (Signed)
Follow up   Pt called, returning RN call

## 2015-08-01 NOTE — Telephone Encounter (Signed)
  Please set her up for right heart cath at the request of Dr. Lake Bells (Pulmonary) to assess for pulmonary hypertension. Dx. Dyspnea.     Thanks  [name], MD

## 2015-08-02 ENCOUNTER — Telehealth: Payer: Self-pay | Admitting: Cardiology

## 2015-08-02 DIAGNOSIS — R0600 Dyspnea, unspecified: Secondary | ICD-10-CM

## 2015-08-02 NOTE — Telephone Encounter (Signed)
F/u  Pt returning phone call to sched cath. Please call back and discuss.

## 2015-08-02 NOTE — Telephone Encounter (Signed)
Left another message for pt to call back to schedule cardiac cath

## 2015-08-02 NOTE — Telephone Encounter (Signed)
Left another message for pt to call back to schedule cath.  (please see telephone note from 08/01/2015)

## 2015-08-03 ENCOUNTER — Encounter: Payer: Self-pay | Admitting: Gastroenterology

## 2015-08-03 ENCOUNTER — Encounter: Payer: Self-pay | Admitting: *Deleted

## 2015-08-03 NOTE — Telephone Encounter (Signed)
I spoke with pt and she would like to schedule cath for November 16,2016.  Cath arranged for 11:30 on November 16,2016 with Dr. Tamala Julian

## 2015-08-03 NOTE — Telephone Encounter (Signed)
I spoke with pt and verbally went over cath instructions with her.  Pt will come in for lab work on 08/07/15 and I told her I would leave printed copy of instructions at front desk for her to pick up when she came in for lab work.

## 2015-08-07 ENCOUNTER — Other Ambulatory Visit (INDEPENDENT_AMBULATORY_CARE_PROVIDER_SITE_OTHER): Payer: Medicare Other | Admitting: *Deleted

## 2015-08-07 DIAGNOSIS — R0600 Dyspnea, unspecified: Secondary | ICD-10-CM | POA: Diagnosis not present

## 2015-08-07 LAB — CBC WITH DIFFERENTIAL/PLATELET
BASOS PCT: 0 % (ref 0–1)
Basophils Absolute: 0 10*3/uL (ref 0.0–0.1)
EOS ABS: 0.1 10*3/uL (ref 0.0–0.7)
Eosinophils Relative: 2 % (ref 0–5)
HCT: 39.3 % (ref 36.0–46.0)
Hemoglobin: 13.5 g/dL (ref 12.0–15.0)
Lymphocytes Relative: 34 % (ref 12–46)
Lymphs Abs: 2 10*3/uL (ref 0.7–4.0)
MCH: 31.8 pg (ref 26.0–34.0)
MCHC: 34.4 g/dL (ref 30.0–36.0)
MCV: 92.7 fL (ref 78.0–100.0)
MONOS PCT: 8 % (ref 3–12)
MPV: 11 fL (ref 8.6–12.4)
Monocytes Absolute: 0.5 10*3/uL (ref 0.1–1.0)
NEUTROS ABS: 3.3 10*3/uL (ref 1.7–7.7)
NEUTROS PCT: 56 % (ref 43–77)
PLATELETS: 207 10*3/uL (ref 150–400)
RBC: 4.24 MIL/uL (ref 3.87–5.11)
RDW: 14.1 % (ref 11.5–15.5)
WBC: 5.9 10*3/uL (ref 4.0–10.5)

## 2015-08-07 LAB — BASIC METABOLIC PANEL
BUN: 13 mg/dL (ref 7–25)
CALCIUM: 9.9 mg/dL (ref 8.6–10.4)
CO2: 25 mmol/L (ref 20–31)
CREATININE: 0.64 mg/dL (ref 0.60–0.93)
Chloride: 106 mmol/L (ref 98–110)
Glucose, Bld: 89 mg/dL (ref 65–99)
Potassium: 4.2 mmol/L (ref 3.5–5.3)
SODIUM: 143 mmol/L (ref 135–146)

## 2015-08-08 LAB — PROTIME-INR
INR: 1.11 (ref <1.50)
PROTHROMBIN TIME: 14.4 s (ref 11.6–15.2)

## 2015-08-11 ENCOUNTER — Encounter (HOSPITAL_COMMUNITY)
Admission: RE | Disposition: A | Discharge status: Home / Self Care | Payer: Medicare Other | Source: Ambulatory Visit | Attending: Interventional Cardiology

## 2015-08-11 ENCOUNTER — Ambulatory Visit (HOSPITAL_COMMUNITY)
Admission: RE | Admit: 2015-08-11 | Discharge: 2015-08-11 | Disposition: A | Discharge status: Home / Self Care | Payer: Medicare Other | Source: Ambulatory Visit | Attending: Interventional Cardiology | Admitting: Interventional Cardiology

## 2015-08-11 DIAGNOSIS — Z7982 Long term (current) use of aspirin: Secondary | ICD-10-CM | POA: Diagnosis not present

## 2015-08-11 DIAGNOSIS — I2721 Secondary pulmonary arterial hypertension: Secondary | ICD-10-CM | POA: Insufficient documentation

## 2015-08-11 DIAGNOSIS — K219 Gastro-esophageal reflux disease without esophagitis: Secondary | ICD-10-CM | POA: Insufficient documentation

## 2015-08-11 DIAGNOSIS — F419 Anxiety disorder, unspecified: Secondary | ICD-10-CM | POA: Insufficient documentation

## 2015-08-11 DIAGNOSIS — I739 Peripheral vascular disease, unspecified: Secondary | ICD-10-CM | POA: Insufficient documentation

## 2015-08-11 DIAGNOSIS — D509 Iron deficiency anemia, unspecified: Secondary | ICD-10-CM | POA: Diagnosis not present

## 2015-08-11 DIAGNOSIS — I2510 Atherosclerotic heart disease of native coronary artery without angina pectoris: Secondary | ICD-10-CM | POA: Diagnosis not present

## 2015-08-11 DIAGNOSIS — I10 Essential (primary) hypertension: Secondary | ICD-10-CM | POA: Insufficient documentation

## 2015-08-11 DIAGNOSIS — I252 Old myocardial infarction: Secondary | ICD-10-CM | POA: Insufficient documentation

## 2015-08-11 DIAGNOSIS — E7800 Pure hypercholesterolemia, unspecified: Secondary | ICD-10-CM | POA: Insufficient documentation

## 2015-08-11 DIAGNOSIS — I272 Other secondary pulmonary hypertension: Secondary | ICD-10-CM | POA: Diagnosis not present

## 2015-08-11 HISTORY — PX: CARDIAC CATHETERIZATION: SHX172

## 2015-08-11 LAB — POCT I-STAT 3, VENOUS BLOOD GAS (G3P V)
Acid-base deficit: 3 mmol/L — ABNORMAL HIGH (ref 0.0–2.0)
BICARBONATE: 22.3 meq/L (ref 20.0–24.0)
Bicarbonate: 25.4 mEq/L — ABNORMAL HIGH (ref 20.0–24.0)
O2 SAT: 56 %
O2 Saturation: 57 %
PCO2 VEN: 43.5 mmHg — AB (ref 45.0–50.0)
PH VEN: 7.379 — AB (ref 7.250–7.300)
PO2 VEN: 30 mmHg (ref 30.0–45.0)
PO2 VEN: 30 mmHg (ref 30.0–45.0)
TCO2: 27 mmol/L (ref 0–100)
TCO2: 23 mmol/L (ref 0–100)
pCO2, Ven: 37.9 mmHg — ABNORMAL LOW (ref 45.0–50.0)
pH, Ven: 7.375 — ABNORMAL HIGH (ref 7.250–7.300)

## 2015-08-11 SURGERY — RIGHT HEART CATH

## 2015-08-11 MED ORDER — LIDOCAINE HCL (PF) 1 % IJ SOLN
INTRAMUSCULAR | Status: DC | PRN
  Administered 2015-08-11: 13:00:00

## 2015-08-11 MED ORDER — SODIUM CHLORIDE 0.9 % IJ SOLN: 3.0000 mL | INTRAMUSCULAR | Status: DC | PRN

## 2015-08-11 MED ORDER — SODIUM CHLORIDE 0.9 % IJ SOLN: 3.0000 mL | Freq: Two times a day (BID) | INTRAMUSCULAR | Status: DC

## 2015-08-11 MED ORDER — ONDANSETRON HCL 4 MG/2ML IJ SOLN: 4.0000 mg | Freq: Four times a day (QID) | INTRAMUSCULAR | Status: DC | PRN

## 2015-08-11 MED ORDER — HEPARIN (PORCINE) IN NACL 2-0.9 UNIT/ML-% IJ SOLN
INTRAMUSCULAR | Status: AC
  Filled 2015-08-11: qty 1000

## 2015-08-11 MED ORDER — ASPIRIN 81 MG PO CHEW: 81.0000 mg | CHEWABLE_TABLET | ORAL | Status: DC

## 2015-08-11 MED ORDER — SODIUM CHLORIDE 0.9 % IV SOLN: 250.0000 mL | INTRAVENOUS | Status: DC | PRN

## 2015-08-11 MED ORDER — FENTANYL CITRATE (PF) 100 MCG/2ML IJ SOLN
INTRAMUSCULAR | Status: AC
  Filled 2015-08-11: qty 2

## 2015-08-11 MED ORDER — ACETAMINOPHEN 325 MG PO TABS: 650.0000 mg | ORAL_TABLET | ORAL | Status: DC | PRN

## 2015-08-11 MED ORDER — SODIUM CHLORIDE 0.9 % IV SOLN
INTRAVENOUS | Status: DC
  Administered 2015-08-11: 11:00:00 via INTRAVENOUS

## 2015-08-11 MED ORDER — LIDOCAINE HCL (PF) 1 % IJ SOLN
INTRAMUSCULAR | Status: AC
  Filled 2015-08-11: qty 30

## 2015-08-11 MED ORDER — FENTANYL CITRATE (PF) 100 MCG/2ML IJ SOLN
INTRAMUSCULAR | Status: DC | PRN
  Administered 2015-08-11: 25 ug via INTRAVENOUS

## 2015-08-11 SURGICAL SUPPLY — 8 items
CATH BALLN WEDGE 5F 110CM (CATHETERS) ×3 IMPLANT
KIT HEART LEFT (KITS) ×3 IMPLANT
PACK CARDIAC CATHETERIZATION (CUSTOM PROCEDURE TRAY) ×3 IMPLANT
SHEATH FAST CATH BRACH 5F 5CM (SHEATH) ×3 IMPLANT
TRANSDUCER W/STOPCOCK (MISCELLANEOUS) ×4 IMPLANT
TUBING ART PRESS 72  MALE/FEM (TUBING) ×2
TUBING ART PRESS 72 MALE/FEM (TUBING) IMPLANT
WIRE EMERALD 3MM-J .025X260CM (WIRE) ×2 IMPLANT

## 2015-08-11 NOTE — Progress Notes (Signed)
IV saline locked.

## 2015-08-11 NOTE — Interval H&P Note (Signed)
History and Physical Interval Note:  08/11/2015 12:19 PM  [name]  has presented today for surgery, with the diagnosis of pulmonary HTN  The various methods of treatment have been discussed with the patient and family. After consideration of risks, benefits and other options for treatment, the patient has consented to  Procedure(s): Right Heart Cath (N/A) as a surgical intervention .  The patient's history has been reviewed, patient examined, no change in status, stable for surgery.  I have reviewed the patient's chart and labs.  Questions were answered to the patient's satisfaction.     Bensimhon, Quillian Quince

## 2015-08-11 NOTE — H&P (View-Only) (Signed)
Subjective:    Patient ID: [name], female    DOB: 1940-12-10, 74 y.o.   MRN: [id number] Synopsis: Refer to [facility] in 2016 for evaluation of shortness of breath. Pulmonary function testing showed an isolated diffusion defect in September 2016, July 2016 echocardiogram suggested pulmonary hypertension.  HPI Chief Complaint  Patient presents with  . Follow-up    pt states her SOB, prod cough with white mucus is unchanged.     [name] states that she still has some shortness of breath. Specifically, she has trouble climbing a flight of stairs or carrying something heavy. She has minimal cough. She doesn't have chest congestion. She has only had small amount of leg swelling. She denies chest pain. She saw cardiology where they reviewed her most recent echocardiogram results.   Past Medical History  Diagnosis Date  . Hypertension   . GERD (gastroesophageal reflux disease)   . Anxiety   . Hyperlipidemia   . History of GI bleed     With AVM  . Anemia     Iron deficiency anemia  . Shoulder impingement syndrome     Right shoulder  . Aortic stenosis   . CAD (coronary artery disease)     psot bypass x2 to diagonal and obtuse marginal. SVG graft-01/08/2009  . Abdominal pain, unspecified site   . Myocardial infarction ([place]) 2010  . Peripheral vascular disease ([place])   . Hypercholesterolemia   . Iron deficiency anemia   . History of cardiovascular stress test     Myoview 7/16:  Low risk, no ischemia or scar, EF 72%  . History of echocardiogram     Echo 7/16:  EF 55-60%, no RWMA, Gr 2 DD, AVR ok (mean 20 mmHg), PASP 40 mmHg      Review of Systems  Constitutional: Negative for fever, chills and fatigue.  HENT: Negative for postnasal drip, rhinorrhea and sinus pressure.   Respiratory: Positive for shortness of breath. Negative for cough and wheezing.   Cardiovascular: Positive for leg swelling. Negative for chest pain and palpitations.       Objective:   Physical  Exam Filed Vitals:   07/27/15 1640  BP: 132/70  Pulse: 77  Height: 5' (1.524 m)  Weight: 157 lb (71.215 kg)  SpO2: 99%   Gen: well appearing HENT: OP clear, TM's clear, neck supple PULM: CTA B, normal percussion CV: RRR, no mgr, trace edema GI: BS+, soft, nontender Derm: no cyanosis or rash Psyche: normal mood and affect  Cardiology records reviewed where she was seen for her coronary artery disease, and aortic valvular disease July 2016 echocardiogram shows slightly elevated PA pressure, upper limit of normal aortic valve gradient July 2016 hemoglobin 14.4     Assessment & Plan:  Dyspnea Shonnie has clear lungs on exam today, lung function testing that shows normal total lung capacity, no airflow obstruction, but a markedly decreased diffusion capacity in the face of a normal hemoglobin. Echocardiogram suggest pulmonary hypertension.  From my standpoint there is no clear evidence of lung disease but I'm very concerned about the possibility of pulmonary arterial hypertension. This may be somehow related to her prior valve disease and she may have diastolic heart failure which is undiagnosed. However, difficult to sort this out without a right heart cath.  Plan: I discussed her case with Dr. Kingsley Plan today She will hear from the cardiology office as we are planning for a right heart catheterization F/u 6-8 weeks or sooner if needed  Current outpatient prescriptions:  .  amLODipine (NORVASC) 10 MG tablet, TAKE 1 TABLET BY MOUTH EVERY DAY, Disp: 30 tablet, Rfl: 3 .  aspirin 81 MG tablet, Take 81 mg by mouth daily., Disp: , Rfl:  .  atorvastatin (LIPITOR) 80 MG tablet, Take 1 tablet (80 mg total) by mouth daily., Disp: 30 tablet, Rfl: 3 .  BYSTOLIC 5 MG tablet, TAKE 1 TABLET BY MOUTH EVERY DAY, Disp: 90 tablet, Rfl: 1 .  Calcium Carbonate-Vitamin D (CALCIUM 600+D) 600-400 MG-UNIT per tablet, Take 1 tablet by mouth daily., Disp: , Rfl:  .  COD LIVER OIL W/VIT A & D PO, Take by  mouth., Disp: , Rfl:  .  ferrous sulfate 325 (65 FE) MG tablet, Take 325 mg by mouth daily with breakfast., Disp: , Rfl:  .  glycopyrrolate (ROBINUL) 2 MG tablet, Take 2 mg by mouth 2 (two) times daily., Disp: , Rfl: 1 .  lisinopril-hydrochlorothiazide (PRINZIDE,ZESTORETIC) 10-12.5 MG per tablet, TAKE 1 TABLET BY MOUTH EVERY DAY, Disp: 90 tablet, Rfl: 1 .  liver oil-zinc oxide (DESITIN) 40 % ointment, Apply topically as needed., Disp: , Rfl:  .  LORazepam (ATIVAN) 0.5 MG tablet, Take 0.5 mg by mouth at bedtime as needed., Disp: , Rfl:  .  meloxicam (MOBIC) 15 MG tablet, Take 15 mg by mouth daily., Disp: , Rfl: 0 .  Multiple Vitamin (MULTIVITAMIN) tablet, Take 1 tablet by mouth daily., Disp: , Rfl:  .  omeprazole (PRILOSEC) 40 MG capsule, Take 40 mg by mouth daily., Disp: , Rfl:  .  potassium chloride (MICRO-K) 10 MEQ CR capsule, TAKE 2 CAPSULES BY MOUTH TWICE A DAY, Disp: 120 capsule, Rfl: 11 .  sertraline (ZOLOFT) 50 MG tablet, Take 100 mg by mouth daily. , Disp: , Rfl:  .  sucralfate (CARAFATE) 1 G tablet, TAKE 1 TABLET BY MOUTH 4 TIMES DAILY - WITH MEALS AND AT BEDTIME., Disp: 90 tablet, Rfl: 1 .  traMADol (ULTRAM) 50 MG tablet, Take 1 tablet (50 mg total) by mouth every 6 (six) hours as needed., Disp: 10 tablet, Rfl: 0 .  [DISCONTINUED] furosemide (LASIX) 20 MG tablet, Take 1 tablet (20 mg total) by mouth as directed. 1 tab for 3 days then STOP (Patient not taking: Reported on 07/10/2015), Disp: 30 tablet, Rfl: 0

## 2015-08-11 NOTE — Discharge Instructions (Signed)
Site Care Refer to this sheet in the next few weeks. These instructions provide you with information about caring for yourself after your procedure. Your health care provider may also give you more specific instructions. Your treatment has been planned according to current medical practices, but problems sometimes occur. Call your health care provider if you have any problems or questions after your procedure. WHAT TO EXPECT AFTER THE PROCEDURE After your procedure, it is typical to have the following:  Bruising at the radial site that usually fades within 1-2 weeks.  Blood collecting in the tissue (hematoma) that may be painful to the touch. It should usually decrease in size and tenderness within 1-2 weeks. HOME CARE INSTRUCTIONS  Take medicines only as directed by your health care provider.  You may shower 24-48 hours after the procedure or as directed by your health care provider. Remove the bandage (dressing) and gently wash the site with plain soap and water. Pat the area dry with a clean towel. Do not rub the site, because this may cause bleeding.  Do not take baths, swim, or use a hot tub until your health care provider approves.  Check your insertion site every day for redness, swelling, or drainage.  Do not apply powder or lotion to the site.  Do not push or pull heavy objects with the affected arm for 24 hours or as directed by your health care provider.  Do not drive home if you are discharged the same day as the procedure. Have someone else drive you.  You may drive 24 hours after the procedure unless otherwise instructed by your health care provider.  Do not operate machinery or power tools for 24 hours after the procedure.  If your procedure was done as an outpatient procedure, which means that you went home the same day as your procedure, a responsible adult should be with you for the first 24 hours after you arrive home.  Keep all follow-up visits as directed by your  health care provider. This is important. SEEK MEDICAL CARE IF:  You have a fever.  You have chills.  You have increased bleeding from the radial site. Hold pressure on the site. SEEK IMMEDIATE MEDICAL CARE IF:  You have unusual pain at the radial site.  You have redness, warmth, or swelling at the radial site.  You have drainage (other than a small amount of blood on the dressing) from the radial site.  The radial site is bleeding, and the bleeding does not stop after 30 minutes of holding steady pressure on the site.  Your arm or hand becomes pale, cool, tingly, or numb.   This information is not intended to replace advice given to you by your health care provider. Make sure you discuss any questions you have with your health care provider.   Document Released: 10/12/2010 Document Revised: 09/30/2014 Document Reviewed: 03/28/2014 Elsevier Interactive Patient Education [employer].

## 2015-08-14 ENCOUNTER — Encounter (HOSPITAL_COMMUNITY): Payer: Self-pay | Admitting: Internal Medicine

## 2015-08-14 LAB — POCT I-STAT 3, VENOUS BLOOD GAS (G3P V)
BICARBONATE: 25.1 meq/L — AB (ref 20.0–24.0)
O2 Saturation: 54 %
PH VEN: 7.378 — AB (ref 7.250–7.300)
PO2 VEN: 29 mmHg — AB (ref 30.0–45.0)
TCO2: 26 mmol/L (ref 0–100)
pCO2, Ven: 42.6 mmHg — ABNORMAL LOW (ref 45.0–50.0)

## 2015-08-14 MED FILL — Heparin Sodium (Porcine) 2 Unit/ML in Sodium Chloride 0.9%: INTRAMUSCULAR | Qty: 500 | Status: AC

## 2015-09-05 ENCOUNTER — Other Ambulatory Visit: Payer: Self-pay | Admitting: Cardiology

## 2015-09-08 ENCOUNTER — Ambulatory Visit: Payer: Medicare Other | Admitting: Pulmonary Disease

## 2015-09-11 ENCOUNTER — Other Ambulatory Visit: Payer: Self-pay | Admitting: Cardiology

## 2015-09-11 ENCOUNTER — Ambulatory Visit: Payer: Medicare Other | Admitting: Pulmonary Disease

## 2015-09-22 ENCOUNTER — Ambulatory Visit (AMBULATORY_SURGERY_CENTER): Payer: Self-pay | Admitting: *Deleted

## 2015-09-22 DIAGNOSIS — Z1211 Encounter for screening for malignant neoplasm of colon: Secondary | ICD-10-CM

## 2015-09-22 NOTE — Progress Notes (Signed)
[name] presented today for PV. She does not want to repeat a colonoscopy and to have to do another prep. She had colonoscopy 3 months ago for complaints of loose stools. Prep was poor, biopsies were completed and negative. Her recall now is for screening colonoscopy. I encouraged patient to proceed with procedure but she requested to meet with the Dr. Durward Fortes that appointment not available until end of February. She is adamant that she does not want a repeat colonoscopy and wants to proceed with discussing with Dr. Ardis Hughs the need or if other alternatives available. Appointment made for February 27th with Dr. Ardis Hughs in the office. January 13th colonoscopy cancelled.

## 2015-09-28 ENCOUNTER — Telehealth: Payer: Self-pay

## 2015-09-28 NOTE — Telephone Encounter (Signed)
-----   Message from Barron Alvine, [place] sent at 06/27/2015 10:05 AM EDT ----- recall colonoscopy in 3 months with double prep

## 2015-09-28 NOTE — Telephone Encounter (Signed)
Pt discussed repeat colon with previsit and decided to follow up with Dr Ardis Hughs to discuss.  She does not want to have any further testing

## 2015-10-06 ENCOUNTER — Encounter: Payer: Medicare Other | Admitting: Gastroenterology

## 2015-10-19 ENCOUNTER — Other Ambulatory Visit: Payer: Self-pay | Admitting: Cardiology

## 2015-11-20 ENCOUNTER — Encounter: Payer: Self-pay | Admitting: Gastroenterology

## 2015-11-20 ENCOUNTER — Ambulatory Visit (INDEPENDENT_AMBULATORY_CARE_PROVIDER_SITE_OTHER): Payer: PPO | Admitting: Gastroenterology

## 2015-11-20 VITALS — BP 100/60 | HR 72 | Ht 59.0 in | Wt 159.5 lb

## 2015-11-20 DIAGNOSIS — K529 Noninfective gastroenteritis and colitis, unspecified: Secondary | ICD-10-CM | POA: Diagnosis not present

## 2015-11-20 NOTE — Patient Instructions (Signed)
Please try imodium one pill once daily (shortly after waking every AM). Consider repeating the colonoscopy since 05/2015 was incomplete due to residual stool. Double prep that time.

## 2015-11-20 NOTE — Progress Notes (Signed)
Review of pertinent gastrointestinal problems: 1. Dysphagia, EGD Dr. Ardis Hughs 9 2016 showed mild nonspecific gastritis. This was H. pylori negative. GE junction was normal. She was instructed to chew her food well, eat slowly and take small bites. 2. Chronic loose stools, colonoscopy Dr. Ardis Hughs 9 2016 was very poorly prepped, random biopsies of the colon showed no sign of microscopic colitis however. She was recommended to have repeat colonoscopy with improved prep.   HPI: This is a  very pleasant 75 year old woman whom I last saw about 6 months ago  Chief complaint is persistent loose stools  Her bowels are still loose, can't go out of the house.  She has loose stools daily, twice per day.  Always loose, has seen blood on one occasional.  She has taken pepto, helped briefly.  The loose stool has been a problem for 2-3 years ago.  GB was removed remotely, while under care of [facility].  Stable weight.  No FH colon cancer.  Past Medical History  Diagnosis Date  . Hypertension   . GERD (gastroesophageal reflux disease)   . Anxiety   . Hyperlipidemia   . History of GI bleed     With AVM  . Anemia     Iron deficiency anemia  . Shoulder impingement syndrome     Right shoulder  . Aortic stenosis   . CAD (coronary artery disease)     psot bypass x2 to diagonal and obtuse marginal. SVG graft-01/08/2009  . Abdominal pain, unspecified site   . Myocardial infarction ([place]) 2010  . Peripheral vascular disease ([place])   . Hypercholesterolemia   . Iron deficiency anemia   . History of cardiovascular stress test     Myoview 7/16:  Low risk, no ischemia or scar, EF 72%  . History of echocardiogram     Echo 7/16:  EF 55-60%, no RWMA, Gr 2 DD, AVR ok (mean 20 mmHg), PASP 40 mmHg    Past Surgical History  Procedure Laterality Date  . Aortic valve replacement      [facility] #[id number]  . Cholecystectomy    . Spine surgery  2003    Herniated diskectomy  . Wrist surgery      right wrist  . Heel  spur excision  2000    left heel  . Coronary artery bypass graft    . Cardiac surgery  2010    , 2 blockages  . Abdominal hysterectomy    . Cardiac catheterization N/A 08/11/2015    Procedure: Right Heart Cath;  Surgeon: Jolaine Artist, MD;  Location: [facility];  Service: Cardiovascular;  Laterality: N/A;    Current Outpatient Prescriptions  Medication Sig Dispense Refill  . amLODipine (NORVASC) 10 MG tablet TAKE 1 TABLET BY MOUTH EVERY DAY 30 tablet 9  . aspirin 81 MG tablet Take 81 mg by mouth daily.    Marland Kitchen atorvastatin (LIPITOR) 80 MG tablet TAKE 1 TABLET (80 MG TOTAL) BY MOUTH DAILY. 30 tablet 4  . BYSTOLIC 5 MG tablet TAKE 1 TABLET BY MOUTH EVERY DAY 90 tablet 0  . Calcium Carbonate-Vitamin D (CALCIUM 600+D) 600-400 MG-UNIT per tablet Take 1 tablet by mouth daily.    . COD LIVER OIL W/VIT A & D PO Take by mouth.    . ferrous sulfate 325 (65 FE) MG tablet Take 325 mg by mouth daily with breakfast.    . glycopyrrolate (ROBINUL) 2 MG tablet Take 2 mg by mouth 2 (two) times daily.  1  .  lisinopril-hydrochlorothiazide (PRINZIDE,ZESTORETIC) 10-12.5 MG per tablet TAKE 1 TABLET BY MOUTH EVERY DAY 90 tablet 1  . liver oil-zinc oxide (DESITIN) 40 % ointment Apply topically as needed.    Marland Kitchen LORazepam (ATIVAN) 0.5 MG tablet Take 0.5 mg by mouth at bedtime as needed for anxiety or sleep.     . meloxicam (MOBIC) 15 MG tablet Take 15 mg by mouth daily.  0  . Multiple Vitamin (MULTIVITAMIN) tablet Take 1 tablet by mouth daily.    Marland Kitchen omeprazole (PRILOSEC) 40 MG capsule Take 40 mg by mouth daily.    . potassium chloride (MICRO-K) 10 MEQ CR capsule TAKE 2 CAPSULES BY MOUTH TWICE A DAY 120 capsule 11  . sertraline (ZOLOFT) 50 MG tablet Take 100 mg by mouth daily.     . sucralfate (CARAFATE) 1 G tablet TAKE 1 TABLET BY MOUTH 4 TIMES DAILY - WITH MEALS AND AT BEDTIME. 90 tablet 1  . traMADol (ULTRAM) 50 MG tablet Take 1 tablet (50 mg total) by mouth every 6 (six) hours as needed. 10 tablet 0  .  [DISCONTINUED] furosemide (LASIX) 20 MG tablet Take 1 tablet (20 mg total) by mouth as directed. 1 tab for 3 days then STOP (Patient not taking: Reported on 07/10/2015) 30 tablet 0   No current facility-administered medications for this visit.    Allergies as of 11/20/2015 - Review Complete 11/20/2015  Allergen Reaction Noted  . Codeine Other (See Comments) 10/14/2011    Family History  Problem Relation Age of Onset  . Hypertension Mother   . Kidney disease Mother   . Heart attack Neg Hx   . Stroke Neg Hx   . Hypertension Father   . Hypertension Sister   . Hypertension Brother   . Hypertension Daughter   . Diabetes Brother     Social History   Social History  . Marital Status: Single    Spouse Name: N/A  . Number of Children: 3  . Years of Education: N/A   Occupational History  . Retired    Social History Main Topics  . Smoking status: Current Every Day Smoker -- 1.00 packs/day for 52 years    Types: Cigarettes  . Smokeless tobacco: Never Used     Comment: has not set quit date  . Alcohol Use: No  . Drug Use: No  . Sexual Activity: Not on file   Other Topics Concern  . Not on file   Social History Narrative     Physical Exam: BP 100/60 mmHg  Pulse 72  Ht '4\' 11"'$  (1.499 m)  Wt 159 lb 8 oz (72.349 kg)  BMI 32.20 kg/m2 Constitutional: generally well-appearing Psychiatric: alert and oriented x3 Abdomen: soft, nontender, nondistended, no obvious ascites, no peritoneal signs, normal bowel sounds   Assessment and plan: 75 y.o. female with chronic loose stools  She had poorly prepped colonoscopy about 5 months ago and I have recommended she repeat that to exclude underlying inflammation, neoplastic lesions. She is not losing weight and she has no family history of colon cancer. I explained to her that I think it is unlikely she has anything serious going on her: But given the poor prep in 2016 I did recommend repeating with a double prep protocol. She does not  seem very interested in that at all. For her symptoms of chronic loose stools, these are going on for at least 2 or 3 years. She has not tried Imodium which I recommended she start one pill once daily shortly after she  wakes up in the morning. I think this will help her symptoms. She will let me know if she is interested in further testing, colonoscopy.   Owens Loffler, MD [facility] 11/20/2015, 10:07 AM

## 2015-12-06 ENCOUNTER — Encounter: Payer: Self-pay | Admitting: Family

## 2015-12-12 ENCOUNTER — Other Ambulatory Visit: Payer: Self-pay | Admitting: Acute Care

## 2015-12-12 DIAGNOSIS — F17210 Nicotine dependence, cigarettes, uncomplicated: Principal | ICD-10-CM

## 2015-12-13 ENCOUNTER — Ambulatory Visit (INDEPENDENT_AMBULATORY_CARE_PROVIDER_SITE_OTHER): Payer: PPO | Admitting: Family

## 2015-12-13 ENCOUNTER — Encounter: Payer: Self-pay | Admitting: Family

## 2015-12-13 ENCOUNTER — Other Ambulatory Visit: Payer: Self-pay | Admitting: *Deleted

## 2015-12-13 ENCOUNTER — Ambulatory Visit (HOSPITAL_COMMUNITY)
Admission: RE | Admit: 2015-12-13 | Discharge: 2015-12-13 | Disposition: A | Discharge status: Home / Self Care | Payer: PPO | Source: Ambulatory Visit | Attending: Family | Admitting: Family

## 2015-12-13 VITALS — BP 111/65 | HR 67 | Temp 98.5°F | Resp 14 | Ht 59.5 in | Wt 156.0 lb

## 2015-12-13 DIAGNOSIS — Z720 Tobacco use: Secondary | ICD-10-CM | POA: Diagnosis not present

## 2015-12-13 DIAGNOSIS — R0682 Tachypnea, not elsewhere classified: Secondary | ICD-10-CM

## 2015-12-13 DIAGNOSIS — R200 Anesthesia of skin: Secondary | ICD-10-CM

## 2015-12-13 DIAGNOSIS — I779 Disorder of arteries and arterioles, unspecified: Secondary | ICD-10-CM | POA: Diagnosis not present

## 2015-12-13 DIAGNOSIS — E785 Hyperlipidemia, unspecified: Secondary | ICD-10-CM | POA: Diagnosis not present

## 2015-12-13 DIAGNOSIS — K219 Gastro-esophageal reflux disease without esophagitis: Secondary | ICD-10-CM | POA: Insufficient documentation

## 2015-12-13 DIAGNOSIS — I739 Peripheral vascular disease, unspecified: Secondary | ICD-10-CM | POA: Diagnosis present

## 2015-12-13 DIAGNOSIS — F17200 Nicotine dependence, unspecified, uncomplicated: Secondary | ICD-10-CM

## 2015-12-13 DIAGNOSIS — R0689 Other abnormalities of breathing: Secondary | ICD-10-CM

## 2015-12-13 DIAGNOSIS — M25569 Pain in unspecified knee: Secondary | ICD-10-CM

## 2015-12-13 DIAGNOSIS — I2510 Atherosclerotic heart disease of native coronary artery without angina pectoris: Secondary | ICD-10-CM | POA: Insufficient documentation

## 2015-12-13 DIAGNOSIS — I252 Old myocardial infarction: Secondary | ICD-10-CM | POA: Diagnosis not present

## 2015-12-13 DIAGNOSIS — I10 Essential (primary) hypertension: Secondary | ICD-10-CM | POA: Insufficient documentation

## 2015-12-13 DIAGNOSIS — R0600 Dyspnea, unspecified: Secondary | ICD-10-CM

## 2015-12-13 DIAGNOSIS — R208 Other disturbances of skin sensation: Secondary | ICD-10-CM | POA: Diagnosis not present

## 2015-12-13 NOTE — Patient Instructions (Signed)

## 2015-12-13 NOTE — Progress Notes (Signed)
[facility] [place] HISTORY AND PHYSICAL -PAD  History of Present Illness [name] is a 75 y.o. female patient of Dr. Scot Dock with known atherosclerosis of the aorta and PAD. She returns today for ABI's and evaluation.   She reports radiating pain in both legs at rest and walking equally, no worse with walking, but this pain "slows me down". She has not shown her left calf lateral aspect mass to her PCP, advised to do so; this has been present since her 2000 heel surgery. 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  She denies non healing ulcers on legs/feet. Has loose stools after eating.  Pt has not had previous intervention for PAD.  The patient denies New Medical or Surgical History. She had a 2 vessel CABG a few years ago, denies history of MI. She denies known history of stroke or TIA.  She feels tired all of the time.  Pt Diabetic: No Pt smoker: smoker (about 3 cigarettes/day, started at age 58 yrs)  Pt meds include: Statin :Yes Betablocker: Yes ASA: Yes Other anticoagulants/antiplatelets: no     Past Medical History  Diagnosis Date  . Hypertension   . GERD (gastroesophageal reflux disease)   . Anxiety   . Hyperlipidemia   . History of GI bleed     With AVM  . Anemia     Iron deficiency anemia  . Shoulder impingement syndrome     Right shoulder  . Aortic stenosis   . CAD (coronary artery disease)     psot bypass x2 to diagonal and obtuse marginal. SVG graft-01/08/2009  . Abdominal pain, unspecified site   . Myocardial infarction ([place]) 2010  . Peripheral vascular disease ([place])   . Hypercholesterolemia   . Iron deficiency anemia   . History of cardiovascular stress test     Myoview 7/16:  Low risk, no ischemia or scar, EF 72%  . History of echocardiogram     Echo 7/16:  EF 55-60%, no RWMA, Gr 2 DD, AVR ok (mean 20 mmHg), PASP 40 mmHg    Social History Social History   Substance Use Topics  . Smoking status: Current Some Day Smoker -- 1.00 packs/day for 52 years    Types: Cigarettes  . Smokeless tobacco: Never Used     Comment: has not set quit date  . Alcohol Use: No    Family History Family History  Problem Relation Age of Onset  . Hypertension Mother   . Kidney disease Mother   . Heart attack Neg Hx   . Stroke Neg Hx   . Hypertension Father   . Hypertension Sister   . Hypertension Brother   . Hypertension Daughter   . Diabetes Brother     Past Surgical History  Procedure Laterality Date  . Aortic valve replacement      Edwards #21  . Cholecystectomy    . Spine surgery  2003    Herniated diskectomy  . Wrist surgery      right wrist  . Heel spur excision  2000    left heel  . Coronary artery bypass graft    . Cardiac surgery  2010    , 2 blockages  . Abdominal hysterectomy    . Cardiac catheterization N/A 08/11/2015    Procedure: Right Heart Cath;  Surgeon: Jolaine Artist, MD;  Location: [facility];  Service: Cardiovascular;  Laterality: N/A;  Allergies  Allergen Reactions  . Codeine Other (See Comments)    Feeling intoxicated    Current Outpatient Prescriptions  Medication Sig Dispense Refill  . amLODipine (NORVASC) 10 MG tablet TAKE 1 TABLET BY MOUTH EVERY DAY 30 tablet 9  . aspirin 81 MG tablet Take 81 mg by mouth daily.    Marland Kitchen atorvastatin (LIPITOR) 80 MG tablet TAKE 1 TABLET (80 MG TOTAL) BY MOUTH DAILY. 30 tablet 4  . BYSTOLIC 5 MG tablet TAKE 1 TABLET BY MOUTH EVERY DAY 90 tablet 0  . Calcium Carbonate-Vitamin D (CALCIUM 600+D) 600-400 MG-UNIT per tablet Take 1 tablet by mouth daily.    . COD LIVER OIL W/VIT A & D PO Take by mouth.    . ferrous sulfate 325 (65 FE) MG tablet Take 325 mg by mouth daily with breakfast.    . glycopyrrolate (ROBINUL) 2 MG tablet Take 2 mg by mouth 2 (two) times daily.  1  . lisinopril-hydrochlorothiazide (PRINZIDE,ZESTORETIC) 10-12.5 MG per tablet TAKE 1 TABLET BY MOUTH  EVERY DAY 90 tablet 1  . liver oil-zinc oxide (DESITIN) 40 % ointment Apply topically as needed.    Marland Kitchen LORazepam (ATIVAN) 0.5 MG tablet Take 0.5 mg by mouth at bedtime as needed for anxiety or sleep.     . meloxicam (MOBIC) 15 MG tablet Take 15 mg by mouth daily.  0  . Multiple Vitamin (MULTIVITAMIN) tablet Take 1 tablet by mouth daily.    Marland Kitchen omeprazole (PRILOSEC) 40 MG capsule Take 40 mg by mouth daily.    . potassium chloride (MICRO-K) 10 MEQ CR capsule TAKE 2 CAPSULES BY MOUTH TWICE A DAY 120 capsule 11  . sertraline (ZOLOFT) 50 MG tablet Take 100 mg by mouth daily.     . sucralfate (CARAFATE) 1 G tablet TAKE 1 TABLET BY MOUTH 4 TIMES DAILY - WITH MEALS AND AT BEDTIME. 90 tablet 1  . traMADol (ULTRAM) 50 MG tablet Take 1 tablet (50 mg total) by mouth every 6 (six) hours as needed. (Patient not taking: Reported on 12/13/2015) 10 tablet 0  . [DISCONTINUED] furosemide (LASIX) 20 MG tablet Take 1 tablet (20 mg total) by mouth as directed. 1 tab for 3 days then STOP (Patient not taking: Reported on 07/10/2015) 30 tablet 0   No current facility-administered medications for this visit.    ROS: See HPI for pertinent positives and negatives.   Physical Examination  Filed Vitals:   12/13/15 1346  BP: 111/65  Pulse: 67  Temp: 98.5 F (36.9 C)  Resp: 14  Height: 4' 11.5" (1.511 m)  Weight: 156 lb (70.761 kg)  SpO2: 97%   Body mass index is 30.99 kg/(m^2).  General: A&O x 3, WDWN, obese female. Gait: limp Eyes: PERRLA. Pulmonary: Moderately labored respirations at rest, tachypneic at 30 respirations/minute, little air movement in all fields, faint wheezing right anterior fields Cardiac: regular rhythm, no detected murmur.     Carotid Bruits Left Right   Negative Negative  Aorta is not palpable. Radial pulses: are 2+ palpable and =   VASCULAR EXAM: Extremities without ischemic changes  without Gangrene; without open  wounds.     LE Pulses LEFT RIGHT   FEMORAL  palpable palpable    POPLITEAL not palpable  not palpable   POSTERIOR TIBIAL not palpable  not palpable    DORSALIS PEDIS  ANTERIOR TIBIAL not palpable  not palpable    Abdomen: soft, not tender, no palpable masses, central obesity. Skin: no rashes, no ulcers. Musculoskeletal: no muscle  wasting or atrophy. Neurologic: A&O X 3; Appropriate Affect ; SENSATION: normal; MOTOR FUNCTION: moving all extremities equally, motor strength 4/5 in arms, 4/5 in legs. Speech is fluent/normal. CN 2-12 intact.                Non-Invasive Vascular Imaging: DATE: 12/13/2015  ABI   R: 1.08 (0.92, 12/09/14), DP: biphasic, PT: triphasic  L: 0.85 (0.85), DP: biphasic, PT: biphasic   ASSESSMENT: [name] is a 75 y.o. female who has known atherosclerosis of the aorta and PAD. She also has known lumbar spine issues with radiculopathy, leg pain is more likely related to this since her right ABI has improved to normal with tri and biphasic waveforms and the left shows evidence of mild arterial occlusive disease with biphasic waveforms.  She is dyspneic at rest, tachypneic at 30 respirations/minute.  She has been evaluated by a pulmonologist who suggested cardiac evaluation, possible differential dx of pulmonary htn.. She then had a cardiac cath in November 2016 but has not followed up with her cardiologist re the results; her grandson with her today is making an appointment for her now with Dr. [name].   I advised pt to follow up with her PCP re a possible referral to a specialist for a spine evaluation.  Unfortunately she continues to smoke.   PLAN:  The patient was counseled re smoking cessation and given several free resources re smoking  cessation.  Based on the patient's vascular studies and examination, pt will return to clinic in 1 year with ABI's.  I discussed in depth with the patient the nature of atherosclerosis, and emphasized the importance of maximal medical management including strict control of blood pressure, blood glucose, and lipid levels, obtaining regular exercise, and cessation of smoking.  The patient is aware that without maximal medical management the underlying atherosclerotic disease process will progress, limiting the benefit of any interventions.  The patient was given information about PAD including signs, symptoms, treatment, what symptoms should prompt the patient to seek immediate medical care, and risk reduction measures to take.  Clemon Chambers, RN, MSN, FNP-C [facility] Phone: [phone]  Clinic MD: Scot Dock  12/13/2015 1:56 PM

## 2015-12-14 ENCOUNTER — Ambulatory Visit (INDEPENDENT_AMBULATORY_CARE_PROVIDER_SITE_OTHER): Payer: PPO | Admitting: Physician Assistant

## 2015-12-14 ENCOUNTER — Ambulatory Visit
Admission: RE | Admit: 2015-12-14 | Discharge: 2015-12-14 | Disposition: A | Discharge status: Home / Self Care | Payer: PPO | Source: Ambulatory Visit | Attending: Physician Assistant | Admitting: Physician Assistant

## 2015-12-14 ENCOUNTER — Encounter: Payer: Self-pay | Admitting: Physician Assistant

## 2015-12-14 ENCOUNTER — Other Ambulatory Visit: Payer: Self-pay | Admitting: Cardiology

## 2015-12-14 VITALS — BP 110/62 | HR 61 | Ht 59.5 in | Wt 157.8 lb

## 2015-12-14 DIAGNOSIS — I10 Essential (primary) hypertension: Secondary | ICD-10-CM

## 2015-12-14 DIAGNOSIS — I25810 Atherosclerosis of coronary artery bypass graft(s) without angina pectoris: Secondary | ICD-10-CM

## 2015-12-14 DIAGNOSIS — R0600 Dyspnea, unspecified: Secondary | ICD-10-CM

## 2015-12-14 DIAGNOSIS — R002 Palpitations: Secondary | ICD-10-CM

## 2015-12-14 DIAGNOSIS — R05 Cough: Secondary | ICD-10-CM

## 2015-12-14 DIAGNOSIS — R059 Cough, unspecified: Secondary | ICD-10-CM

## 2015-12-14 DIAGNOSIS — R0602 Shortness of breath: Secondary | ICD-10-CM

## 2015-12-14 LAB — BRAIN NATRIURETIC PEPTIDE: Brain Natriuretic Peptide: 36.5 pg/mL (ref <100)

## 2015-12-14 MED ORDER — FUROSEMIDE 20 MG PO TABS: 20.0000 mg | ORAL_TABLET | Freq: Every day | ORAL | Status: DC | PRN

## 2015-12-14 NOTE — Progress Notes (Signed)
Cardiology Office Note   Date:  12/14/2015   ID:  [name], DOB 02/03/1941, MRN [id number]  PCP:  [name], MD  Cardiologist:  Dr. [name]  Chief Complaint  Patient presents with  . Follow-up    seen for Dr. [name]      History of Present Illness: [name] is a 75 y.o. female who presents for cardiology office follow-up. She has a history of CAD s/p CABG 2 with graft to diagonal and obtuse marginal on 12/29/2008, aortic valve replacement with bioprosthetic #21 valve, hypertension, history of AVM with prior GI bleed. She had a Holter monitor placed in May 2015 which demonstrated no significant bradycardia, rare PACs. Her last echocardiogram obtained on 03/30/2015 showed EF 79-02%, grade 2 diastolic dysfunction, no regional wall motion abnormality, bioprosthetic aortic valve present and functioning normally, PA peak pressure 40. She had an normal Myoview on 03/30/2015 which showed low risk study, EF greater than 65%, no ischemia or infarction. She was seen by pulmonology, pulmonary function tests showed a normal lung capacity, no airflow obstruction, but markedly decreased diffusion capacity in the face of normal hemoglobin. Per pulmonologist, there is no clear evidence of lung disease, however very concerned about the possibility of pulmonary arterial hypertension. She recently underwent right heart cath by Dr. Haroldine Laws on 08/11/2015 which showed PA pressure 53/14, with mean of 29, cardiac output of 3.2, cardiac index 1.9. It appears she has very good filling pressure, however she had mild PAH and moderately elevated PVR. Given relatively normal mixed venous saturation, suspect cardiac output may be falsely low. After discussing with pulmonology, it was decided not to start selective pulmonary dilator at this time. However the symptom persist, would consider cardiopulmonary exercise to further evaluate.  She presents today to cardiology office for follow-up. She states she has  been coughing for the past 3 month. She described it as a nonproductive cough. She continued to complain of dyspnea especially on exertion. She is not sure if she is having a cold or something else. She does not have significant lower extremity edema, however does have right basilar rail on exam. Will obtain chest x-ray and BNP today, and I have given the patient PRN prescription of Lasix, I will contact the patient to start Lasix if chest x-ray and BNP are concerning for heart failure. She will follow-up next month, if her symptoms does not improve, she will need a cardiopulmonary stress test    Past Medical History  Diagnosis Date  . Hypertension   . GERD (gastroesophageal reflux disease)   . Anxiety   . Hyperlipidemia   . History of GI bleed     With AVM  . Anemia     Iron deficiency anemia  . Shoulder impingement syndrome     Right shoulder  . Aortic stenosis   . CAD (coronary artery disease)     psot bypass x2 to diagonal and obtuse marginal. SVG graft-01/08/2009  . Abdominal pain, unspecified site   . Myocardial infarction ([place]) 2010  . Peripheral vascular disease ([place])   . Hypercholesterolemia   . Iron deficiency anemia   . History of cardiovascular stress test     Myoview 7/16:  Low risk, no ischemia or scar, EF 72%  . History of echocardiogram     Echo 7/16:  EF 55-60%, no RWMA, Gr 2 DD, AVR ok (mean 20 mmHg), PASP 40 mmHg    Past Surgical History  Procedure Laterality Date  . Aortic valve replacement  Edwards #21  . Cholecystectomy    . Spine surgery  2003    Herniated diskectomy  . Wrist surgery      right wrist  . Heel spur excision  2000    left heel  . Coronary artery bypass graft    . Cardiac surgery  2010    , 2 blockages  . Abdominal hysterectomy    . Cardiac catheterization N/A 08/11/2015    Procedure: Right Heart Cath;  Surgeon: Jolaine Artist, MD;  Location: [facility];  Service: Cardiovascular;  Laterality: N/A;     Current  Outpatient Prescriptions  Medication Sig Dispense Refill  . amLODipine (NORVASC) 10 MG tablet TAKE 1 TABLET BY MOUTH EVERY DAY 30 tablet 9  . aspirin 81 MG tablet Take 81 mg by mouth daily.    Marland Kitchen atorvastatin (LIPITOR) 80 MG tablet TAKE 1 TABLET (80 MG TOTAL) BY MOUTH DAILY. 30 tablet 4  . BYSTOLIC 5 MG tablet TAKE 1 TABLET BY MOUTH EVERY DAY 90 tablet 0  . Calcium Carbonate-Vitamin D (CALCIUM 600+D) 600-400 MG-UNIT per tablet Take 1 tablet by mouth daily.    . COD LIVER OIL W/VIT A & D PO Take by mouth.    . ferrous sulfate 325 (65 FE) MG tablet Take 325 mg by mouth daily with breakfast.    . glycopyrrolate (ROBINUL) 2 MG tablet Take 2 mg by mouth 2 (two) times daily.  1  . LORazepam (ATIVAN) 0.5 MG tablet Take 0.5 mg by mouth at bedtime as needed for anxiety or sleep.     . meloxicam (MOBIC) 15 MG tablet Take 15 mg by mouth daily.  0  . Multiple Vitamin (MULTIVITAMIN) tablet Take 1 tablet by mouth daily.    Marland Kitchen omeprazole (PRILOSEC) 40 MG capsule Take 40 mg by mouth daily.    . potassium chloride (MICRO-K) 10 MEQ CR capsule TAKE 2 CAPSULES BY MOUTH TWICE A DAY 120 capsule 11  . sertraline (ZOLOFT) 100 MG tablet TAKE 1 & 1/2 TABLET BY MOUTH ONCE A DAY  1  . sucralfate (CARAFATE) 1 G tablet TAKE 1 TABLET BY MOUTH 4 TIMES DAILY - WITH MEALS AND AT BEDTIME. 90 tablet 1  . traMADol (ULTRAM) 50 MG tablet Take 50 mg by mouth every 6 (six) hours as needed for moderate pain.    . furosemide (LASIX) 20 MG tablet Take 1 tablet (20 mg total) by mouth daily as needed. 90 tablet 3  . lisinopril-hydrochlorothiazide (PRINZIDE,ZESTORETIC) 10-12.5 MG tablet TAKE 1 TABLET BY MOUTH EVERY DAY 90 tablet 1   No current facility-administered medications for this visit.    Allergies:   Codeine    Social History:  The patient  reports that she has been smoking Cigarettes.  She has a 52 pack-year smoking history. She has never used smokeless tobacco. She reports that she does not drink alcohol or use illicit drugs.     Family History:  The patient's family history includes Diabetes in her brother; Hypertension in her brother, daughter, father, mother, and sister; Kidney disease in her mother. There is no history of Heart attack or Stroke.    ROS:  Please see the history of present illness.   Otherwise, review of systems are positive for DOE, dry cough.   All other systems are reviewed and negative.    PHYSICAL EXAM: VS:  BP 110/62 mmHg  Pulse 61  Ht 4' 11.5" (1.511 m)  Wt 157 lb 12.8 oz (71.578 kg)  BMI 31.35 kg/m2  SpO2 97% , BMI Body mass index is 31.35 kg/(m^2). GEN: Well nourished, well developed, in no acute distress HEENT: normal Neck: no JVD, carotid bruits, or masses Cardiac: RRR; no murmurs, rubs, or gallops. Trace edema in LE Respiratory:  clear to auscultation bilaterally, normal work of breathing Mild intermittent L basilar rale.  GI: soft, nontender, nondistended, + BS MS: no deformity or atrophy Skin: warm and dry, no rash Neuro:  Strength and sensation are intact Psych: euthymic mood, full affect   EKG:  EKG is ordered today. The ekg ordered today demonstrates sinus brady with HR 54, no significant ST-T wave changes   Recent Labs: 01/11/2015: ALT 21 03/14/2015: Pro B Natriuretic peptide (BNP) 32.0 08/07/2015: BUN 13; Creat 0.64; Hemoglobin 13.5; Platelets 207; Potassium 4.2; Sodium 143    Lipid Panel    Component Value Date/Time   CHOL 124 01/31/2014 0926   TRIG 74.0 01/31/2014 0926   HDL 36.10* 01/31/2014 0926   CHOLHDL 3 01/31/2014 0926   VLDL 14.8 01/31/2014 0926   LDLCALC 73 01/31/2014 0926      Wt Readings from Last 3 Encounters:  12/14/15 157 lb 12.8 oz (71.578 kg)  12/13/15 156 lb (70.761 kg)  11/20/15 159 lb 8 oz (72.349 kg)      Other studies Reviewed: Additional studies/ records that were reviewed today include:   Echo 03/30/2015 LV EF: 55% - 60%  ------------------------------------------------------------------- History: PMH: Acquired  from the patient and from the patient&'s chart. Dyspnea.  ------------------------------------------------------------------- Study Conclusions  - Left ventricle: The cavity size was normal. Wall thickness was  normal. Systolic function was normal. The estimated ejection  fraction was in the range of 55% to 60%. Wall motion was normal;  there were no regional wall motion abnormalities. Features are  consistent with a pseudonormal left ventricular filling pattern,  with concomitant abnormal relaxation and increased filling  pressure (grade 2 diastolic dysfunction). - Aortic valve: A bioprosthesis was present and functioning  normally. - Pulmonary arteries: Systolic pressure was mildly increased. PA  peak pressure: 40 mm Hg (S).  Impressions:  - Aortic prosthesis gradients are similar to previous study.    [place] 08/11/2015  Findings:  RA = 7 RV = 49/0/10 PA = 53/14 (29) PCW = 9 Fick cardiac output/index = 3.2/1.9 PVR = 6.4 WU Ao sat = 96% PA sat = 56%, 57%  Assessment: 1. Normal R and L sided filling pressures 2. Mild PAH with moderately elevated PVR 3. Low cardiac output  Plan/Discussion:  Filling pressures look great. She does have mild PAH moderately elevated PVR. Given relatively normal mixed venous saturations suspect Fick cardiac output may be falsely low and this will raise PVR. Would not start selective pulmonary dilators at this time. If symptoms persist could consider cardiopulmonary exercise testing to further evaluate. Will d/w Dr. [place] Bells.    Review of the above records demonstrates:    Extensive workup for SOB include PFT and RHC, pulmonologist concern for PAH, RHC does show mild PAH but data conflicting, recommended if SOB does improve, try cardiopulmonary stress test.    ASSESSMENT AND PLAN:  1.  DOE  - continue to have symptom, had myoview last year which showed no ischemia, previous PFT shows normal lung capacity but markedly  diminished diffusion capacity. RHC performed by Dr. Haroldine Laws showed mixed result, but mild PAH, recommended cardiopulmonary stress test if symptom does not improve.  - she only has trace edema in LE, mild intermittent rale in L base, but not significant enough to  cause her symptom. We will obtain CXR and BNP, I have given her a Rx of PRN lasix, if both CXR and BNP shows CHF, will start lasix. If negative, will obtain cardiopulmonary stress test to further assess her SOB. Followup with Dr. [name] in 1 month  2. CAD s/p CABG 2 with graft to diagonal and obtuse marginal on 12/29/2008: no obvious angina, previous myoview in 2016 was normal  3. aortic valve replacement with bioprosthetic #21 valve: did not appreciate significant murmur on physical exam, will not repeat echo at this time  4. Hypertension: well controlled  5. history of AVM with prior GI bleed: no sign of bleeding.   Current medicines are reviewed at length with the patient today.  The patient does not have concerns regarding medicines.  The following changes have been made:  no change  Labs/ tests ordered today include:   Orders Placed This Encounter  Procedures  . DG Chest 2 View  . B Nat Peptide  . EKG 12-Lead     Disposition:   FU with Dr. [name] in 1 month  Signed, Almyra Deforest, [place]  12/14/2015 11:06 PM    [address], [place], [place]  [address] Phone: [phone]; Fax: [phone]

## 2015-12-14 NOTE — Patient Instructions (Signed)
Medication Instructions:  Your physician has recommended you make the following change in your medication:  1.  START the Lasix 20 mg taking 1 tablet DAILY AS NEEDED (DO NOT START THIS MEDICATION UNTIL WE CALL YOU)   Labwork: TODAY:  BNP  Testing/Procedures: A chest x-ray takes a picture of the organs and structures inside the chest, including the heart, lungs, and blood vessels. This test can show several things, including, whether the heart is enlarges; whether fluid is building up in the lungs; and whether pacemaker / defibrillator leads are still in place.   Follow-Up: Your physician recommends that you schedule a follow-up appointment in: 2-4 [place], [place]   Any Other Special Instructions Will Be Listed Below (If Applicable).  X-rays X-rays are tests that create pictures of the inside of your body using radiation. Different body parts absorb different amounts of radiation, which show up on the X-ray pictures in shades of black, gray, and white.  X-rays are used to look for many health conditions, including broken bones, lung problems, and some types of cancer.  LET [facility] CARE PROVIDER KNOW ABOUT:  Any allergies you have.  All medicines you are taking, including vitamins, herbs, eye drops, creams, and over-the-counter medicines.  Previous surgeries you have had.  Medical conditions you have. RISK AND COMPLICATIONS Getting an X-ray is a safe procedure.  BEFORE THE PROCEDURE  Tell the X-ray technician if you are pregnant or might be pregnant.  You may be asked to wear a protective lead apron to hide parts of your body from the X-ray.  You usually will need to undress whatever part of your body needs the X-ray. You will be given a hospital gown to wear, if needed.  You may need to remove your glasses, jewelry, and other metal objects. PROCEDURE   The X-ray machine creates a picture by using a tiny burst of radiation. It is painless.  You  may need to have several pictures taken at different angles.  You will need to try to be as still as you can during the examination to get the best possible images. AFTER THE PROCEDURE  You will be able to resume your normal activities.  The X-ray will be examined by your health care provider or a radiology specialist.  It is your responsibility to get your test results. Ask your health care provider when to expect your results and how to get them.   This information is not intended to replace advice given to you by your health care provider. Make sure you discuss any questions you have with your health care provider.   Document Released: 09/09/2005 Document Revised: 09/30/2014 Document Reviewed: 11/03/2013 Elsevier Interactive Patient Education [employer].    If you need a refill on your cardiac medications before your next appointment, please call your pharmacy.

## 2015-12-15 ENCOUNTER — Telehealth: Payer: Self-pay | Admitting: Physician Assistant

## 2015-12-15 NOTE — Telephone Encounter (Signed)
Both chest x-ray and BNP came back normal, she likely does not have heart failure. I have contacted the patient and instructed her not to take the Lasix at this point. Question if her shortness breath with exertion is still related to mild pulmonary arterial hypertension. Dr. Haroldine Laws recommended a cardiopulmonary stress test last November the patient continued to be symptomatic. I have discussed this case with Dr. [name], the patient's primary cardiologist, who recommended referral to the heart failure clinic to be reassessed by Dr. Haroldine Laws to see if cardiopulmonary stress test is warranted in this case.  Hilbert Corrigan PA Pager: [phone]

## 2016-01-08 ENCOUNTER — Encounter: Payer: Self-pay | Admitting: Cardiology

## 2016-01-08 ENCOUNTER — Ambulatory Visit (HOSPITAL_COMMUNITY)
Admission: RE | Admit: 2016-01-08 | Discharge: 2016-01-08 | Disposition: A | Discharge status: Home / Self Care | Payer: PPO | Source: Ambulatory Visit | Attending: Internal Medicine | Admitting: Internal Medicine

## 2016-01-08 ENCOUNTER — Ambulatory Visit: Payer: PPO | Admitting: Physician Assistant

## 2016-01-08 ENCOUNTER — Ambulatory Visit (INDEPENDENT_AMBULATORY_CARE_PROVIDER_SITE_OTHER): Payer: PPO | Admitting: Cardiology

## 2016-01-08 VITALS — BP 120/72 | HR 70 | Ht 59.0 in | Wt 157.8 lb

## 2016-01-08 VITALS — BP 112/64 | HR 59 | Wt 158.0 lb

## 2016-01-08 DIAGNOSIS — Z833 Family history of diabetes mellitus: Secondary | ICD-10-CM | POA: Diagnosis not present

## 2016-01-08 DIAGNOSIS — I2510 Atherosclerotic heart disease of native coronary artery without angina pectoris: Secondary | ICD-10-CM | POA: Insufficient documentation

## 2016-01-08 DIAGNOSIS — Z951 Presence of aortocoronary bypass graft: Secondary | ICD-10-CM | POA: Diagnosis not present

## 2016-01-08 DIAGNOSIS — Z885 Allergy status to narcotic agent status: Secondary | ICD-10-CM | POA: Insufficient documentation

## 2016-01-08 DIAGNOSIS — R0600 Dyspnea, unspecified: Secondary | ICD-10-CM | POA: Insufficient documentation

## 2016-01-08 DIAGNOSIS — I739 Peripheral vascular disease, unspecified: Secondary | ICD-10-CM | POA: Insufficient documentation

## 2016-01-08 DIAGNOSIS — Z7982 Long term (current) use of aspirin: Secondary | ICD-10-CM | POA: Diagnosis not present

## 2016-01-08 DIAGNOSIS — Z720 Tobacco use: Secondary | ICD-10-CM | POA: Diagnosis not present

## 2016-01-08 DIAGNOSIS — I359 Nonrheumatic aortic valve disorder, unspecified: Secondary | ICD-10-CM

## 2016-01-08 DIAGNOSIS — M1990 Unspecified osteoarthritis, unspecified site: Secondary | ICD-10-CM | POA: Diagnosis not present

## 2016-01-08 DIAGNOSIS — I272 Other secondary pulmonary hypertension: Secondary | ICD-10-CM | POA: Insufficient documentation

## 2016-01-08 DIAGNOSIS — I2721 Secondary pulmonary arterial hypertension: Secondary | ICD-10-CM

## 2016-01-08 DIAGNOSIS — E785 Hyperlipidemia, unspecified: Secondary | ICD-10-CM | POA: Diagnosis not present

## 2016-01-08 DIAGNOSIS — K219 Gastro-esophageal reflux disease without esophagitis: Secondary | ICD-10-CM | POA: Insufficient documentation

## 2016-01-08 DIAGNOSIS — I252 Old myocardial infarction: Secondary | ICD-10-CM | POA: Diagnosis not present

## 2016-01-08 DIAGNOSIS — F17210 Nicotine dependence, cigarettes, uncomplicated: Secondary | ICD-10-CM | POA: Diagnosis not present

## 2016-01-08 DIAGNOSIS — F419 Anxiety disorder, unspecified: Secondary | ICD-10-CM | POA: Insufficient documentation

## 2016-01-08 DIAGNOSIS — I10 Essential (primary) hypertension: Secondary | ICD-10-CM | POA: Diagnosis not present

## 2016-01-08 DIAGNOSIS — M25551 Pain in right hip: Secondary | ICD-10-CM | POA: Insufficient documentation

## 2016-01-08 DIAGNOSIS — J449 Chronic obstructive pulmonary disease, unspecified: Secondary | ICD-10-CM | POA: Diagnosis not present

## 2016-01-08 DIAGNOSIS — Z953 Presence of xenogenic heart valve: Secondary | ICD-10-CM | POA: Insufficient documentation

## 2016-01-08 DIAGNOSIS — Z952 Presence of prosthetic heart valve: Secondary | ICD-10-CM

## 2016-01-08 DIAGNOSIS — M7541 Impingement syndrome of right shoulder: Secondary | ICD-10-CM | POA: Diagnosis not present

## 2016-01-08 DIAGNOSIS — Z79899 Other long term (current) drug therapy: Secondary | ICD-10-CM | POA: Diagnosis not present

## 2016-01-08 DIAGNOSIS — Z8249 Family history of ischemic heart disease and other diseases of the circulatory system: Secondary | ICD-10-CM | POA: Insufficient documentation

## 2016-01-08 DIAGNOSIS — Z954 Presence of other heart-valve replacement: Secondary | ICD-10-CM

## 2016-01-08 DIAGNOSIS — E7800 Pure hypercholesterolemia, unspecified: Secondary | ICD-10-CM | POA: Insufficient documentation

## 2016-01-08 MED ORDER — TIOTROPIUM BROMIDE MONOHYDRATE 18 MCG IN CAPS: 18.0000 ug | ORAL_CAPSULE | Freq: Every morning | RESPIRATORY_TRACT | Status: DC

## 2016-01-08 NOTE — Progress Notes (Signed)
[facility]. [address], [address] [place], [place]  [address] Phone: [phone] Fax:  [phone]  Date:  01/08/2016   ID:  [name], DOB Sep 04, 1941, MRN [id number]  PCP:  [name], MD   History of Present Illness: [name] is a 75 y.o. female with coronary artery disease status post bypass x2- grafts to diagonal and obtuse marginal 12/29/08, aortic valve replacement with bioprosthetic #21 valve, hypertension, AVM with prior GI bleed (no futher post AVR)  here for followup.  She had a Holter monitor placed in May 2015 which demonstrated no significant bradycardia, rare PACs. Her last echocardiogram obtained on 03/30/2015 showed EF 31-49%, grade 2 diastolic dysfunction, no regional wall motion abnormality, bioprosthetic aortic valve present and functioning normally, PA peak pressure 40. She had an normal Myoview on 03/30/2015 which showed low risk study, EF greater than 65%, no ischemia or infarction. She was seen by pulmonology, pulmonary function tests showed a normal lung capacity, no airflow obstruction, but markedly decreased diffusion capacity in the face of normal hemoglobin. Per pulmonologist, there is no clear evidence of lung disease, however very concerned about the possibility of pulmonary arterial hypertension. Chest x-ray is unremarkable, BNP was normal.  She recently underwent right heart cath by Dr. Haroldine Laws on 08/11/2015 which showed PA pressure 53/14, with mean of 29, cardiac output of 3.2, cardiac index 1.9. It appears she has very good filling pressure, however she had mild PAH and moderately elevated PVR. Given relatively normal mixed venous saturation, suspect cardiac output may be falsely low. After discussing with pulmonology, it was decided not to start selective pulmonary dilator at this time. However the symptom persist, would consider cardiopulmonary exercise to further evaluate.  Echocardiogram  demonstrated bioprosthetic aortic valve velocity of 3.0 m/s  which is upper limits of normal as well as ascending root dilated aorta of 4.3 cm. Her overall ejection fraction was normal.  In regards to lipids, LDL has been under good control with atorvastatin. Prior lab work reviewed. Previous creatinine 0.6.  She has had continued somatic complaints, aches and pains she states. No exertional anginal symptoms. Left lateral leg pain and we continued to express the importance of exercise and conditioning. We have tried Bystolic instead of metoprolol previously because of previously described fatigue.  Holter monitor in May of 2015 demonstrated no significant bradycardia, rare PACs. No new medications needed.  Continues to have some shortness of breath with activity. Continues to smoke.   Wt Readings from Last 3 Encounters:  01/08/16 157 lb 12.8 oz (71.578 kg)  12/14/15 157 lb 12.8 oz (71.578 kg)  12/13/15 156 lb (70.761 kg)     Past Medical History  Diagnosis Date  . Hypertension   . GERD (gastroesophageal reflux disease)   . Anxiety   . Hyperlipidemia   . History of GI bleed     With AVM  . Anemia     Iron deficiency anemia  . Shoulder impingement syndrome     Right shoulder  . Aortic stenosis   . CAD (coronary artery disease)     psot bypass x2 to diagonal and obtuse marginal. SVG graft-01/08/2009  . Abdominal pain, unspecified site   . Myocardial infarction ([place]) 2010  . Peripheral vascular disease ([place])   . Hypercholesterolemia   . Iron deficiency anemia   . History of cardiovascular stress test     Myoview 7/16:  Low risk, no ischemia or scar, EF 72%  . History of echocardiogram  Echo 7/16:  EF 55-60%, no RWMA, Gr 2 DD, AVR ok (mean 20 mmHg), PASP 40 mmHg    Past Surgical History  Procedure Laterality Date  . Aortic valve replacement      Edwards #21  . Cholecystectomy    . Spine surgery  2003    Herniated diskectomy  . Wrist surgery      right wrist  . Heel spur excision  2000    left heel  . Coronary artery bypass  graft    . Cardiac surgery  2010    , 2 blockages  . Abdominal hysterectomy    . Cardiac catheterization N/A 08/11/2015    Procedure: Right Heart Cath;  Surgeon: Jolaine Artist, MD;  Location: [facility];  Service: Cardiovascular;  Laterality: N/A;    Current Outpatient Prescriptions  Medication Sig Dispense Refill  . amLODipine (NORVASC) 10 MG tablet TAKE 1 TABLET BY MOUTH EVERY DAY 30 tablet 9  . aspirin 81 MG tablet Take 81 mg by mouth daily.    Marland Kitchen atorvastatin (LIPITOR) 80 MG tablet TAKE 1 TABLET (80 MG TOTAL) BY MOUTH DAILY. 30 tablet 4  . BYSTOLIC 5 MG tablet TAKE 1 TABLET BY MOUTH EVERY DAY 90 tablet 0  . Calcium Carbonate-Vitamin D (CALCIUM 600+D) 600-400 MG-UNIT per tablet Take 1 tablet by mouth daily.    . COD LIVER OIL W/VIT A & D PO Take by mouth.    . ferrous sulfate 325 (65 FE) MG tablet Take 325 mg by mouth daily with breakfast.    . furosemide (LASIX) 20 MG tablet Take 1 tablet (20 mg total) by mouth daily as needed. 90 tablet 3  . glycopyrrolate (ROBINUL) 2 MG tablet Take 2 mg by mouth 2 (two) times daily.  1  . lisinopril-hydrochlorothiazide (PRINZIDE,ZESTORETIC) 10-12.5 MG tablet TAKE 1 TABLET BY MOUTH EVERY DAY 90 tablet 1  . LORazepam (ATIVAN) 0.5 MG tablet Take 0.5 mg by mouth at bedtime as needed for anxiety or sleep.     . meloxicam (MOBIC) 15 MG tablet Take 15 mg by mouth daily.  0  . Multiple Vitamin (MULTIVITAMIN) tablet Take 1 tablet by mouth daily.    Marland Kitchen omeprazole (PRILOSEC) 40 MG capsule Take 40 mg by mouth daily.    . potassium chloride (MICRO-K) 10 MEQ CR capsule TAKE 2 CAPSULES BY MOUTH TWICE A DAY 120 capsule 11  . sertraline (ZOLOFT) 100 MG tablet TAKE 1 & 1/2 TABLET BY MOUTH ONCE A DAY  1  . sucralfate (CARAFATE) 1 G tablet TAKE 1 TABLET BY MOUTH 4 TIMES DAILY - WITH MEALS AND AT BEDTIME. 90 tablet 1  . traMADol (ULTRAM) 50 MG tablet Take 50 mg by mouth every 6 (six) hours as needed for moderate pain.     No current facility-administered  medications for this visit.    Allergies:    Allergies  Allergen Reactions  . Codeine Other (See Comments)    Feeling intoxicated    Social History:  The patient  reports that she has been smoking Cigarettes.  She has a 52 pack-year smoking history. She has never used smokeless tobacco. She reports that she does not drink alcohol or use illicit drugs.   ROS:  Please see the history of present illness.   Denies any syncope, bleeding, orthopnea, PND.  Positive for fatigue, chest pain, leg swelling, shortness of breath, back pain, abdominal pain, blood in stool, cough.   PHYSICAL EXAM: VS:  BP 120/72 mmHg  Pulse 70  Ht '4\' 11"'$  (1.499 m)  Wt 157 lb 12.8 oz (71.578 kg)  BMI 31.85 kg/m2 Well nourished, well developed, in no acute distress HEENT: normal Neck: no JVD, radiation of aortic murmur Cardiac:  normal S1, S2; RRR; 2/6 systolic murmur Right upper sternal border. Scar noted. Lungs:  Seems to have poor air movement B, subtle wheeze right posterior lung.  clear to auscultation bilaterally, no wheezing, rhonchi or rales Abd: soft, nontender, no hepatomegaly Ext: no edema Skin: warm and dry Neuro: no focal abnormalities noted  EKG: 07/14/14-sinus rhythm, minimal sinus arrhythmia, no other abnormalities. 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 No significant change from prior.  ECHO : 07/14/13 - - Left ventricle: There was mild focal basal hypertrophy of the septum. Systolic function was vigorous. The estimated ejection fraction was in the range of 65% to 70%. Wall motion was normal; there were no regional wall motion abnormalities. Doppler parameters are consistent with abnormal left ventricular relaxation (grade 1 diastolic dysfunction). - Aortic valve: A prosthesis was present and functioning normally. The prosthesis had a normal range of motion. The sewing ring appeared normal, had no  rocking motion, and showed no evidence of dehiscence. Peak velocity: 338cm/s (S) , elevated for bioprosthetic valve. Prior echo 06/11/12 - Peak vel 3.64ms. (Discussed with Dr. BPablo Ledger.  - Mitral valve: Calcified annulus.  Echo 03/30/15-normal EF, normal functioning aortic valve replacement, and aortic root size is normal.    LABS: 06/28/13 - LDL 67.  ASSESSMENT AND PLAN:  1. Coronary artery disease status post bypass-currently overall doing well with no exertional anginal symptoms. Still has baseline shortness of breath. Continue with aspirin., Beta blocker, statin, ACE inhibitor. Previous bypass anatomy reviewed. Secondary prevention. 2. Dyspnea-Dr. MAnastasia Pallnote reviewed. No evidence of obstruction on pulmonary function test. She does have occasional wheezing she states. Her air movement does seem decreased on physical exam by auscultation. He believes that pulmonary hypertension may be playing a role. Right catheterization reviewed as above. Her previous measurement was 40 mmHg, mild. Smoking sensation nonetheless is key as well as blood pressure control. Gave her recommendation for Coricidin HBP for colds. She is going to be evaluated by Dr. BHaroldine Laws question possible need for cardiopulmonary stress testing. 3. Aortic valve replacement-upper normal velocity on echocardiogram. Continue to monitor. Murmur appreciated on exam. Discussed with Dr. BCyndia Bent 4. AVM-prior GI bleed. Once aortic valve replaced, no further bleeding episodes. This can be related to lack of degradation of von Willebrand's factor. She understands to take dental prophylaxis antibiotics. 5. Hypertension essential-under great control. No changes made. Renal function has been stable, 0.7 creatinine.  6. Dysphagia-had endoscopies with Dr. JArdis Hughs Mild gastritis noted. Recommended to chew food well, small bites. 7. Hyperlipidemia- Overall good control. High-dose statin. 8. Tobacco use-encourage tobacco cessation. 5  cigs a day. She has not taking any medications such as Chantix. She is worried about trying this type of medication. I counseled her on picking a stop date, trying to control cravings. 9. Obesity-encourage weight loss. Discussed BMI with her. Decreasing carbohydrates. Diet. Hard for her to exercise. 10. Dilated aortic root-we will continue to monitor with echocardiogram.  11. 6 month follow  Signed, MCandee Furbish MD [facility] 01/08/2016 9:52 AM

## 2016-01-08 NOTE — Progress Notes (Signed)
Patient ID: [name], female   DOB: 10-07-1940, 75 y.o.   MRN: [id number]   ADVANCED HF CLINIC CONSULT NOTE  Referring Physician: [name] Pulmonary: [facility] Primary Cardiologist: [name]  HPI:  SALEM Weeks is a 75 y.o. female smoker, with coronary artery disease status post bypass x2- grafts to diagonal and obtuse marginal 12/29/08, aortic valve replacement with bioprosthetic #21 valve, hypertension, AVM with prior GI bleed (no futher post AVR)referred by Dr. [name] for further evaluation of ongoing dyspnea and PAH.  Her last echocardiogram obtained on 03/30/2015 showed EF 53-64%, grade 2 diastolic dysfunction, no regional wall motion abnormality, bioprosthetic aortic valve present and functioning normally, PA peak pressure 40, mean Aov gradient was 31mHG. She had an normal Myoview on 03/30/2015 which showed low risk study, EF greater than 65%, no ischemia or infarction. She was seen by Dr. MLake Bellsin pulmonology, pulmonary function tests showed a normal lung capacity, no airflow obstruction, but markedly decreased diffusion capacity in the face of normal hemoglobin raising concern about the possibility of pulmonary arterial hypertension. Chest x-ray is unremarkable, BNP was normal. Chest CT with mild COPD.  [place]. I discussed with Dr. MLake Bellsan we decided not to start a selective pulmonary vasodilator at the time as PA pressures only mildly elevated. We felt CPX test would be helpful to further evaluate  RA = 7 RV = 49/0/10 PA = 53/14 (29) PCW = 9 Fick cardiac output/index = 3.2/1.9 PVR = 6.4 WU Ao sat = 96% PA sat = 56%, 57%  PFTs 9/16 FEV1 0.96 (70%) FVC  1.21 L (68%) FEV/FVC 79% DLCO 8.4 (45%) corrects to 94% for [facility] TLC normal RV 111%  Myoview 7/16: Low risk study Normal resting and stress perfusion with no ischemia or infarction EF 72%    Says she gets short of breath with ADLs such as going to the mailbox. Rides the buggy in the store. Mild edema.  Gets SOB with eating. No orthopnea or PND. Smokes 1/2ppd. Occasional chest pain 1x/week. No real change.    Review of Systems: [y] = yes, '[ ]'$  = no   General: Weight gain '[ ]'$ ; Weight loss '[ ]'$ ; Anorexia '[ ]'$ ; Fatigue [ y]; Fever '[ ]'$ ; Chills '[ ]'$ ; Weakness [Blue.Reese]  Cardiac: Chest pain/pressure '[ ]'$ ; Resting SOB [Blue.Reese]; Exertional SOB [Blue.Reese]; Orthopnea '[ ]'$ ; Pedal Edema [ y]; Palpitations '[ ]'$ ; Syncope '[ ]'$ ; Presyncope '[ ]'$ ; Paroxysmal nocturnal dyspnea'[ ]'$   Pulmonary: Cough [Blue.Reese]; Wheezing'[ ]'$ ; Hemoptysis'[ ]'$ ; Sputum '[ ]'$ ; Snoring '[ ]'$   GI: Vomiting'[ ]'$ ; Dysphagia'[ ]'$ ; Melena'[ ]'$ ; Hematochezia '[ ]'$ ; Heartburn'[ ]'$ ; Abdominal pain '[ ]'$ ; Constipation '[ ]'$ ; Diarrhea '[ ]'$ ; BRBPR [Blue.Reese]  GU: Hematuria'[ ]'$ ; Dysuria '[ ]'$ ; Nocturia'[ ]'$   Vascular: Pain in legs with walking '[ ]'$ ; Pain in feet with lying flat '[ ]'$ ; Non-healing sores '[ ]'$ ; Stroke '[ ]'$ ; TIA '[ ]'$ ; Slurred speech '[ ]'$ ;  Neuro: Headaches'[ ]'$ ; Vertigo'[ ]'$ ; Seizures'[ ]'$ ; Paresthesias'[ ]'$ ;Blurred vision '[ ]'$ ; Diplopia '[ ]'$ ; Vision changes '[ ]'$   Ortho/Skin: Arthritis [Blue.Reese]; Joint pain [Blue.Reese]; Muscle pain [ y]; Joint swelling '[ ]'$ ; Back Pain '[ ]'$ ; Rash '[ ]'$   Psych: Depression'[ ]'$ ; Anxiety'[ ]'$   Heme: Bleeding problems '[ ]'$ ; Clotting disorders '[ ]'$ ; Anemia '[ ]'$   Endocrine: Diabetes '[ ]'$ ; Thyroid dysfunction'[ ]'$    Past Medical History  Diagnosis Date  . Hypertension   . GERD (gastroesophageal reflux disease)   . Anxiety   .  Hyperlipidemia   . History of GI bleed     With AVM  . Anemia     Iron deficiency anemia  . Shoulder impingement syndrome     Right shoulder  . Aortic stenosis   . CAD (coronary artery disease)     psot bypass x2 to diagonal and obtuse marginal. SVG graft-01/08/2009  . Abdominal pain, unspecified site   . Myocardial infarction ([place]) 2010  . Peripheral vascular disease ([place])   . Hypercholesterolemia   . Iron deficiency anemia   . History of cardiovascular stress test     Myoview 7/16:  Low risk, no ischemia or scar, EF 72%  . History of echocardiogram     Echo 7/16:  EF  55-60%, no RWMA, Gr 2 DD, AVR ok (mean 20 mmHg), PASP 40 mmHg    Current Outpatient Prescriptions  Medication Sig Dispense Refill  . amLODipine (NORVASC) 10 MG tablet TAKE 1 TABLET BY MOUTH EVERY DAY 30 tablet 9  . aspirin 81 MG tablet Take 81 mg by mouth daily.    Marland Kitchen atorvastatin (LIPITOR) 80 MG tablet TAKE 1 TABLET (80 MG TOTAL) BY MOUTH DAILY. 30 tablet 4  . BYSTOLIC 5 MG tablet TAKE 1 TABLET BY MOUTH EVERY DAY 90 tablet 0  . Calcium Carbonate-Vitamin D (CALCIUM 600+D) 600-400 MG-UNIT per tablet Take 1 tablet by mouth daily.    . ferrous sulfate 325 (65 FE) MG tablet Take 325 mg by mouth daily with breakfast.    . glycopyrrolate (ROBINUL) 2 MG tablet Take 2 mg by mouth 2 (two) times daily.  1  . lisinopril-hydrochlorothiazide (PRINZIDE,ZESTORETIC) 10-12.5 MG tablet TAKE 1 TABLET BY MOUTH EVERY DAY 90 tablet 1  . LORazepam (ATIVAN) 0.5 MG tablet Take 0.5 mg by mouth at bedtime as needed for anxiety or sleep.     . meloxicam (MOBIC) 15 MG tablet Take 15 mg by mouth daily.  0  . Multiple Vitamin (MULTIVITAMIN) tablet Take 1 tablet by mouth daily.    Marland Kitchen omeprazole (PRILOSEC) 40 MG capsule Take 40 mg by mouth daily.    . potassium chloride (MICRO-K) 10 MEQ CR capsule TAKE 2 CAPSULES BY MOUTH TWICE A DAY 120 capsule 11  . sertraline (ZOLOFT) 100 MG tablet TAKE 1 & 1/2 TABLET BY MOUTH ONCE A DAY  1  . sucralfate (CARAFATE) 1 G tablet TAKE 1 TABLET BY MOUTH 4 TIMES DAILY - WITH MEALS AND AT BEDTIME. 90 tablet 1  . traMADol (ULTRAM) 50 MG tablet Take 50 mg by mouth every 6 (six) hours as needed for moderate pain.    . furosemide (LASIX) 20 MG tablet Take 20 mg by mouth daily as needed for edema. Reported on 01/08/2016     No current facility-administered medications for this encounter.    Allergies  Allergen Reactions  . Codeine Other (See Comments)    Feeling intoxicated      Social History   Social History  . Marital Status: Single    Spouse Name: N/A  . Number of Children: 3  . Years  of Education: N/A   Occupational History  . Retired    Social History Main Topics  . Smoking status: Current Some Day Smoker -- 1.00 packs/day for 52 years    Types: Cigarettes  . Smokeless tobacco: Never Used     Comment: has not set quit date  . Alcohol Use: No  . Drug Use: No  . Sexual Activity: Not on file   Other Topics Concern  .  Not on file   Social History Narrative      Family History  Problem Relation Age of Onset  . Hypertension Mother   . Kidney disease Mother   . Heart attack Neg Hx   . Stroke Neg Hx   . Hypertension Father   . Hypertension Sister   . Hypertension Brother   . Hypertension Daughter   . Diabetes Brother     Danley Danker Vitals:   01/08/16 1155  BP: 112/64  Pulse: 59  Weight: 158 lb (71.668 kg)  SpO2: 96%    Hall walk: limited by R hip pain. Sats 96%->92%  PHYSICAL EXAM: General:  Obese.Walks with limp due to R hip pain. Dyspneic on mild exertion HEENT: normal Neck: supple. Hard to see JVD - not overly elevated. Carotids 2+ bilat; no bruits. No lymphadenopathy or thryomegaly appreciated. Cor: PMI nonpalpable. Regular rate & rhythm. 2/g SEM RSB Lungs: moderately diminished clear Abdomen: obese soft, nontender, nondistended.  No bruits or masses. Good bowel sounds. Extremities: no cyanosis, clubbing, rash, edema Neuro: alert & oriented x 3, cranial nerves grossly intact. moves all 4 extremities w/o difficulty. Affect pleasant.   ASSESSMENT & PLAN: 1. Dyspnea 2. Mild PAH 3. Mild COPD 4.CAD s/p CABG 5. AS s/p AVR 6. Tobacco use ongoing 7. Osteoarthritis   Difficult situation. She is quite SOB on just minimal exertion but does not desaturate significantly on hall walk (96% -> 92%). I reviewed all her studies and discussed with Dr. [place] Bells by phone for almost 15 minutes. I think her dyspnea is probably mulitfactorial due to COPD, mild PAH, obesity, deconditioning, diastolic HF. In reviewing PFTs with Dr. [place] Bells she has mild COPD at worst  but DLCO decreased out of proportion. RHC with only very mild PAH though.   Ideally, I would like to perform CPX to further sort out degree of limitation and main contributing factor (heart, lung, deconditioning, obesity) but unfortunately she cannot tolerate due to her right hip pain. At this point will get VQ for completeness sake. Add spiriva. Refer to Pulmonary Rehab. Encouraged her to lose weight and stop smoking.   Gissela Bloch,MD 12:30 AM

## 2016-01-08 NOTE — Progress Notes (Signed)
Advanced Heart Failure Medication Review by a Pharmacist  Does the patient  feel that his/her medications are working for him/her?  yes  Has the patient been experiencing any side effects to the medications prescribed?  no  Does the patient measure his/her own blood pressure or blood glucose at home?  yes   Does the patient have any problems obtaining medications due to transportation or finances?   no  Understanding of regimen: good Understanding of indications: good Potential of compliance: good Patient understands to avoid NSAIDs. Patient understands to avoid decongestants.  Issues to address at subsequent visits: None   Pharmacist comments:  Ms. Lagrand is a pleasant 75 yo F presenting with her family member but without a medication list. She seems to have a good understanding of her regimen though and reports good compliance. She states that she has not taken any lasix since it was prescribed recently PRN. She did not have any other medication-related questions or concerns for me at this time.   Ruta Hinds. Velva Harman, PharmD, BCPS, CPP Clinical Pharmacist Pager: [phone] Phone: [phone] 01/08/2016 12:07 PM      Time with patient: 10 minutes Preparation and documentation time: 2 minutes Total time: 12 minutes

## 2016-01-08 NOTE — Patient Instructions (Signed)
START Spiriva Handihaler once daily.  Will schedule you for a VQ scan at [facility].  Will refer you to pulmonary rehab at [facility].  Follow up 4-6 weeks with Dr. Haroldine Laws.  Do the following things EVERYDAY: 1) Weigh yourself in the morning before breakfast. Write it down and keep it in a log. 2) Take your medicines as prescribed 3) Eat low salt foods-Limit salt (sodium) to 2000 mg per day.  4) Stay as active as you can everyday 5) Limit all fluids for the day to less than 2 liters

## 2016-01-08 NOTE — Patient Instructions (Signed)

## 2016-01-18 ENCOUNTER — Other Ambulatory Visit: Payer: Self-pay | Admitting: Cardiology

## 2016-02-03 ENCOUNTER — Other Ambulatory Visit: Payer: Self-pay | Admitting: Cardiology

## 2016-04-01 ENCOUNTER — Telehealth: Payer: Self-pay | Admitting: Cardiology
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
## 2016-04-01 NOTE — Telephone Encounter (Signed)
Pt c/o back and leg pain for about 2 months now.  States she saw a PCP "awhile ago but she didn't do nothing about it."  Advised she should f/u with her PCP for further evalution and treatment.  She states understanding.

## 2016-06-03 ENCOUNTER — Other Ambulatory Visit: Payer: Self-pay | Admitting: Cardiology

## 2016-06-28 ENCOUNTER — Telehealth: Payer: Self-pay

## 2016-06-28 ENCOUNTER — Ambulatory Visit (INDEPENDENT_AMBULATORY_CARE_PROVIDER_SITE_OTHER)
Admission: RE | Admit: 2016-06-28 | Discharge: 2016-06-28 | Disposition: A | Discharge status: Home / Self Care | Payer: Medicare Other | Source: Ambulatory Visit | Attending: Acute Care | Admitting: Acute Care

## 2016-06-28 DIAGNOSIS — F17210 Nicotine dependence, cigarettes, uncomplicated: Secondary | ICD-10-CM

## 2016-06-28 NOTE — Telephone Encounter (Signed)
Received call report from [facility] radiology who just wanted [place] to aware that results were ready to view. Impression 2 states The "S" modifier represents a potentially clinically significant non pulmonary finding Will route to SG to f/u.  SG please advise. Thanks.

## 2016-07-03 ENCOUNTER — Telehealth: Payer: Self-pay | Admitting: Acute Care

## 2016-07-03 DIAGNOSIS — F17210 Nicotine dependence, cigarettes, uncomplicated: Secondary | ICD-10-CM

## 2016-07-03 NOTE — Telephone Encounter (Signed)
I have called these results to the patient. I told her that her screening scan was read as a Lung RADS 2: nodules that are benign in appearance and behavior with a very low likelihood of becoming a clinically active cancer due to size or lack of growth. Recommendation per radiology is for a repeat LDCT in 12 months.I told her we will schedule her for her follow up scan in 12 months. I exolained that there was an incidental finding of a mild increase  Of her ascending aorta when compared to her previous exam. She has had aortic valve repair in the past. She is seen by Dr. [name]. I told her we will refer her to his office for follow up of this finding. She verbalized understanding.

## 2016-07-04 ENCOUNTER — Other Ambulatory Visit: Payer: Self-pay | Admitting: *Deleted

## 2016-07-04 ENCOUNTER — Other Ambulatory Visit: Payer: Self-pay | Admitting: Acute Care

## 2016-07-04 ENCOUNTER — Telehealth: Payer: Self-pay | Admitting: Cardiology

## 2016-07-04 ENCOUNTER — Encounter: Payer: Self-pay | Admitting: *Deleted

## 2016-07-04 DIAGNOSIS — Z9889 Other specified postprocedural states: Principal | ICD-10-CM

## 2016-07-04 DIAGNOSIS — Z8679 Personal history of other diseases of the circulatory system: Secondary | ICD-10-CM

## 2016-07-04 NOTE — Telephone Encounter (Signed)
Called pt. Pt unavailable. Left voice message to call back.

## 2016-07-04 NOTE — Progress Notes (Signed)
Cardiology Office Note   Date:  07/05/2016   ID:  [name], DOB 07/23/1941, MRN [id number]  PCP:  [name], MD  Cardiologist:  Dr. [name]    Chief Complaint  Patient presents with  . Circulatory Problem    increased size of dialated aortic root      History of Present Illness: [name] is a 75 y.o. female who presents for dilated aortic root.     Pt has a hx. Of coronary artery disease status post bypass x2- grafts to diagonal and obtuse marginal 12/29/08, aortic valve replacement with bioprosthetic #21 valve, hypertension, AVM with prior GI bleed (no futher post AVR) .   She had a Holter monitor placed in May 2015 which demonstrated no significant bradycardia, rare PACs. Her last echocardiogram obtained on 03/30/2015 showed EF 70-48%, grade 2 diastolic dysfunction, no regional wall motion abnormality, bioprosthetic aortic valve present and functioning normally, PA peak pressure 40. She had an normal Myoview on 03/30/2015 which showed low risk study, EF greater than 65%, no ischemia or infarction. She was seen by pulmonology, pulmonary function tests showed a normal lung capacity, no airflow obstruction, but markedly decreased diffusion capacity in the face of normal hemoglobin. Per pulmonologist, there is no clear evidence of lung disease, however very concerned about the possibility of pulmonary arterial hypertension. Chest x-ray is unremarkable, BNP was normal.    She recently underwent right heart cath by Dr. Haroldine Laws on 08/11/2015 which showed PA pressure 53/14, with mean of 29, cardiac output of 3.2, cardiac index 1.9. It appears she has very good filling pressure, however she had mild PAH and moderately elevated PVR. Given relatively normal mixed venous saturation, suspect cardiac output may be falsely low. After discussing with pulmonology, it was decided not to start selective pulmonary dilator at this time. However the symptom persist, would consider  cardiopulmonary exercise to further evaluate- but she was unable to exercise due to hip pain.   Echocardiogram  demonstrated bioprosthetic aortic valve velocity of 3.0 m/s which is upper limits of normal as well as ascending root dilated aorta of 4.3 cm. Her overall ejection fraction was normal.   Last Echo was 03/2015 with  Aortic root of 3.2 cm now by CTA root is 4.3 CM previous CTA had aortic root at 3.9 cm.   Pt complains of increased SOB from last April.  She continues to smoke 3-5 cigarettes per day.  We discussed importance of her stopping.   Past Medical History:  Diagnosis Date  . Abdominal pain, unspecified site   . Anemia    Iron deficiency anemia  . Anxiety   . Aortic stenosis   . CAD (coronary artery disease)    psot bypass x2 to diagonal and obtuse marginal. SVG graft-01/08/2009  . GERD (gastroesophageal reflux disease)   . History of cardiovascular stress test    Myoview 7/16:  Low risk, no ischemia or scar, EF 72%  . History of echocardiogram    Echo 7/16:  EF 55-60%, no RWMA, Gr 2 DD, AVR ok (mean 20 mmHg), PASP 40 mmHg  . History of GI bleed    With AVM  . Hypercholesterolemia   . Hyperlipidemia   . Hypertension   . Iron deficiency anemia   . Myocardial infarction 2010  . Peripheral vascular disease ([place])   . Shoulder impingement syndrome    Right shoulder    Past Surgical History:  Procedure Laterality Date  . ABDOMINAL HYSTERECTOMY    . AORTIC  VALVE REPLACEMENT     [place] #21  . CARDIAC CATHETERIZATION N/A 08/11/2015   Procedure: Right Heart Cath;  Surgeon: Jolaine Artist, MD;  Location: [facility];  Service: Cardiovascular;  Laterality: N/A;  . CARDIAC SURGERY  2010   , 2 blockages  . CHOLECYSTECTOMY    . CORONARY ARTERY BYPASS GRAFT    . HEEL SPUR EXCISION  2000   left heel  . SPINE SURGERY  2003   Herniated diskectomy  . WRIST SURGERY     right wrist     Current Outpatient Prescriptions  Medication Sig Dispense Refill  .  amLODipine (NORVASC) 10 MG tablet TAKE 1 TABLET BY MOUTH EVERY DAY 30 tablet 9  . aspirin 81 MG tablet Take 81 mg by mouth daily.    Marland Kitchen atorvastatin (LIPITOR) 80 MG tablet TAKE 1 TABLET BY MOUTH EVERY DAY 30 tablet 10  . BYSTOLIC 5 MG tablet TAKE 1 TABLET BY MOUTH EVERY DAY 90 tablet 3  . Calcium Carbonate-Vitamin D (CALCIUM 600+D) 600-400 MG-UNIT per tablet Take 1 tablet by mouth daily.    . ferrous sulfate 325 (65 FE) MG tablet Take 325 mg by mouth daily with breakfast.    . furosemide (LASIX) 20 MG tablet Take 20 mg by mouth daily as needed for edema. Reported on 01/08/2016    . glycopyrrolate (ROBINUL) 2 MG tablet Take 2 mg by mouth 2 (two) times daily.  1  . lisinopril-hydrochlorothiazide (PRINZIDE,ZESTORETIC) 10-12.5 MG tablet Take 1 tablet by mouth 2 (two) times daily. 60 tablet 6  . LORazepam (ATIVAN) 0.5 MG tablet Take 0.5 mg by mouth at bedtime as needed for anxiety or sleep.     . meloxicam (MOBIC) 7.5 MG tablet Take 7.5 mg by mouth 2 (two) times daily as needed for pain. for pain  12  . Multiple Vitamin (MULTIVITAMIN) tablet Take 1 tablet by mouth daily.    Marland Kitchen omeprazole (PRILOSEC) 40 MG capsule Take 40 mg by mouth daily.    . potassium chloride (MICRO-K) 10 MEQ CR capsule TAKE 2 CAPSULES BY MOUTH TWICE A DAY 120 capsule 11  . sertraline (ZOLOFT) 100 MG tablet TAKE 1 & 1/2 TABLET BY MOUTH ONCE A DAY  1  . sucralfate (CARAFATE) 1 G tablet TAKE 1 TABLET BY MOUTH 4 TIMES DAILY - WITH MEALS AND AT BEDTIME. 90 tablet 1  . tiotropium (SPIRIVA HANDIHALER) 18 MCG inhalation capsule Place 1 capsule (18 mcg total) into inhaler and inhale every morning. 30 capsule 12  . traMADol (ULTRAM) 50 MG tablet Take 50 mg by mouth every 6 (six) hours as needed for moderate pain.     No current facility-administered medications for this visit.     Allergies:   Codeine    Social History:  The patient  reports that she has been smoking Cigarettes.  She has a 52.00 pack-year smoking history. She has never  used smokeless tobacco. She reports that she does not drink alcohol or use drugs.   Family History:  The patient's family history includes Diabetes in her brother; Hypertension in her brother, daughter, father, mother, and sister; Kidney disease in her mother.    ROS:  General:no colds or fevers, + weight increase. Skin:no rashes or ulcers HEENT:no blurred vision, no congestion CV:see HPI PUL:see HPI GI:no diarrhea constipation or melena, no indigestion GU:no hematuria, no dysuria MS:no joint pain, no claudication Neuro:no syncope, no lightheadedness Endo:no diabetes, no thyroid disease  Wt Readings from Last 3 Encounters:  07/05/16 163  lb (73.9 kg)  01/08/16 158 lb (71.7 kg)  01/08/16 157 lb 12.8 oz (71.6 kg)     PHYSICAL EXAM: VS:  BP 134/70   Pulse 60   Ht 5' (1.524 m)   Wt 163 lb (73.9 kg)   BMI 31.83 kg/m  , BMI Body mass index is 31.83 kg/m. General:Pleasant affect, NAD Skin:Warm and dry, brisk capillary refill HEENT:normocephalic, sclera clear, mucus membranes moist Neck:supple, no JVD, no bruits  Heart:S1S2 RRR with 2/6 systolic murmur, no gallup, rub or click Lungs:diminished without rales, rhonchi, or wheezes PYP:PJKD, non tender, + BS, do not palpate liver spleen or masses Ext:no lower ext edema, 2+ pedal pulses, 2+ radial pulses Neuro:alert and oriented X 3, MAE, follows commands, + facial symmetry    EKG:  EKG is ordered today. The ekg ordered today demonstrates bradycardia at 58, no changes from previous.   Recent Labs: 08/07/2015: BUN 13; Creat 0.64; Hemoglobin 13.5; Platelets 207; Potassium 4.2; Sodium 143 12/14/2015: Brain Natriuretic Peptide 36.5    Lipid Panel    Component Value Date/Time   CHOL 124 01/31/2014 0926   TRIG 74.0 01/31/2014 0926   HDL 36.10 (L) 01/31/2014 0926   CHOLHDL 3 01/31/2014 0926   VLDL 14.8 01/31/2014 0926   LDLCALC 73 01/31/2014 0926       Other studies Reviewed: Additional studies/ records that were reviewed  today include: . [place] 07/2015  RA = 7 RV = 49/0/10 PA = 53/14 (29) PCW = 9 Fick cardiac output/index = 3.2/1.9 PVR = 6.4 WU Ao sat = 96% PA sat = 56%, 57%  PFTs 9/16 FEV1 0.96 (70%) FVC  1.21 L (68%) FEV/FVC 79% DLCO 8.4 (45%) corrects to 94% for [facility] TLC normal RV 111%  Myoview 7/16: Low risk study Normal resting and stress perfusion with no ischemia or infarction EF 72%    CTA 06/28/16 FINDINGS: Cardiovascular: Prior median sternotomy. Aortic and branch vessel atherosclerosis. Status post aortic valve repair. The proximal ascending aorta measures 4.3 x 4.3 cm today versus 4.1 x 4.2 cm on the prior exam. Tortuous descending thoracic aorta. Mild cardiomegaly. No pericardial effusion. Pulmonary artery enlargement again identified, with the outflow tract measuring 3.4 cm.  Mediastinum/Nodes: No middle mediastinal adenopathy. Hilar regions poorly evaluated without intravenous contrast.  Anterior mediastinal soft tissue density measures 9 mm on image 21/ series 2, not readily apparent on the prior exam.  Lungs/Pleura: No pleural fluid. Mild centrilobular emphysema. Scattered pulmonary nodules, including at maximally volume derived equivalent diameter 3.4 mm in the right apex. No significant enlargement. No highly suspicious or dominant nodule.  Upper Abdomen: Cholecystectomy. Normal imaged portions of the liver, spleen, stomach, pancreas, adrenal glands. An upper pole left renal cyst. Normal image right kidney.  Musculoskeletal: No acute osseous abnormality. Moderate thoracic spondylosis.  IMPRESSION: 1. Lung-RADS Category 2S, benign appearance or behavior. Continue annual screening with low-dose chest CT without contrast in 12 months. 2. The "S" modifier represents a potentially clinically significant non pulmonary finding. Increase in mild ascending aortic enlargement, maximally 4.3 cm. An adjacent soft tissue density is most likely an upper normal size  prevascular node. As this was not readily apparent on the prior exam, this warrants attention on follow-up. Consider CTA of the chest for further evaluation of the aorta and exclude unlikely complication of aneurysm and prior aortic valve repair. 3. Pulmonary artery enlargement suggests pulmonary arterial hypertension. 4.  Aortic atherosclerosis. These results will be called to the ordering clinician or representative by the Radiologist Assistant, and  communication documented in the PACS or zVision Dashboard.  ECHO 03/2015 Study Conclusions  - Left ventricle: The cavity size was normal. Wall thickness was   normal. Systolic function was normal. The estimated ejection   fraction was in the range of 55% to 60%. Wall motion was normal;   there were no regional wall motion abnormalities. Features are   consistent with a pseudonormal left ventricular filling pattern,   with concomitant abnormal relaxation and increased filling   pressure (grade 2 diastolic dysfunction). - Aortic valve: A bioprosthesis was present and functioning   normally. - Pulmonary arteries: Systolic pressure was mildly increased. PA   peak pressure: 40 mm Hg (S).  Impressions:  - Aortic prosthesis gradients are similar to previous study.  ASSESSMENT AND PLAN:  1.  Dilated aortic root-we have been continuing to monitor with echocardiogram, now with increase in size by CTA for lung nodule.  Will repeat Echo and have her keep appt with Dr. [name] for follow up.    2.  Dyspnea-Dr. Anastasia Pall and Dr. Haroldine Laws note reviewed. No evidence of obstruction on pulmonary function test. She does have occasional wheezing she states. Her air movement does seem decreased on physical exam by auscultation. He believes that pulmonary hypertension may be playing a role. Right catheterization reviewed as above. Her previous measurement was 40 mmHg, mild. Smoking sensation nonetheless is key as well as blood pressure control   Increasing lisinopril to 10-12.5 BID.  Again reviewed importance of stopping tobacco   3. Coronary artery disease status post bypass-currently overall doing well with no exertional anginal symptoms. Still has baseline shortness of breath. Continue with aspirin., Beta blocker, statin, ACE inhibitor, SB at 59 will not increase BB   4.  Aortic valve replacement-upper normal velocity on echocardiogram  5. Hypertension essential increase the lisinipril hct, check BMP  6. Tobacco use-encourage tobacco cessation   Current medicines are reviewed with the patient today.  The patient Has no concerns regarding medicines.  The following changes have been made:  See above Labs/ tests ordered today include:see above  Disposition:   FU:  see above  Signed, Cecilie Kicks, NP  07/05/2016 8:38 PM    [facility], [place] [place], [place] Phone: [phone]; Fax: [phone]

## 2016-07-04 NOTE — Telephone Encounter (Signed)
New Message  Pt call stating she received call from RN. Please call back to discuss

## 2016-07-05 ENCOUNTER — Ambulatory Visit (INDEPENDENT_AMBULATORY_CARE_PROVIDER_SITE_OTHER): Payer: Medicare Other | Admitting: Cardiology

## 2016-07-05 ENCOUNTER — Encounter: Payer: Self-pay | Admitting: Cardiology

## 2016-07-05 VITALS — BP 134/70 | HR 60 | Ht 60.0 in | Wt 163.0 lb

## 2016-07-05 DIAGNOSIS — Z952 Presence of prosthetic heart valve: Secondary | ICD-10-CM | POA: Diagnosis not present

## 2016-07-05 DIAGNOSIS — I2510 Atherosclerotic heart disease of native coronary artery without angina pectoris: Secondary | ICD-10-CM

## 2016-07-05 DIAGNOSIS — I2721 Secondary pulmonary arterial hypertension: Secondary | ICD-10-CM | POA: Diagnosis not present

## 2016-07-05 DIAGNOSIS — I77810 Thoracic aortic ectasia: Secondary | ICD-10-CM

## 2016-07-05 DIAGNOSIS — Z720 Tobacco use: Secondary | ICD-10-CM | POA: Diagnosis not present

## 2016-07-05 DIAGNOSIS — R0600 Dyspnea, unspecified: Secondary | ICD-10-CM

## 2016-07-05 MED ORDER — LISINOPRIL-HYDROCHLOROTHIAZIDE 10-12.5 MG PO TABS: 1.0000 | ORAL_TABLET | Freq: Two times a day (BID) | ORAL | 6 refills | Status: DC

## 2016-07-05 NOTE — Patient Instructions (Addendum)
Medication Instructions:  INCREASE Lisinopril HCTZ 10/12.'5MG'$  from once a day to 1 tablet twice a day   Labwork: None   Testing/Procedures: Your physician has requested that you have an echocardiogram. Echocardiography is a painless test that uses sound waves to create images of your heart. It provides your doctor with information about the size and shape of your heart and how well your heart's chambers and valves are working. This procedure takes approximately one hour. There are no restrictions for this procedure.   Follow-Up: Your physician recommends that you schedule a follow-up appointment in: Keep upcoming appointment with Dr [name].   Any Other Special Instructions Will Be Listed Below (If Applicable).  ECHO NEED TO BE SCHEDULED BEFORE 07/17/2016.   If you need a refill on your cardiac medications before your next appointment, please call your pharmacy.

## 2016-07-09 NOTE — Telephone Encounter (Signed)
Pt was seen by Cecilie Kicks 10/13.  She is scheduled for 2 D Echo 10/24 for dilated aortic root and a f/u appt with Dr [name] on 10/25.

## 2016-07-16 ENCOUNTER — Ambulatory Visit (HOSPITAL_COMMUNITY)
Admission: RE | Admit: 2016-07-16 | Discharge: 2016-07-16 | Disposition: A | Discharge status: Home / Self Care | Payer: Medicare Other | Source: Ambulatory Visit | Attending: Cardiology | Admitting: Cardiology

## 2016-07-16 DIAGNOSIS — Z720 Tobacco use: Secondary | ICD-10-CM | POA: Diagnosis not present

## 2016-07-16 DIAGNOSIS — Z952 Presence of prosthetic heart valve: Secondary | ICD-10-CM

## 2016-07-16 DIAGNOSIS — I2510 Atherosclerotic heart disease of native coronary artery without angina pectoris: Secondary | ICD-10-CM | POA: Diagnosis not present

## 2016-07-16 DIAGNOSIS — Z953 Presence of xenogenic heart valve: Secondary | ICD-10-CM | POA: Insufficient documentation

## 2016-07-16 DIAGNOSIS — I071 Rheumatic tricuspid insufficiency: Secondary | ICD-10-CM | POA: Insufficient documentation

## 2016-07-16 DIAGNOSIS — I119 Hypertensive heart disease without heart failure: Secondary | ICD-10-CM | POA: Insufficient documentation

## 2016-07-16 DIAGNOSIS — I059 Rheumatic mitral valve disease, unspecified: Secondary | ICD-10-CM | POA: Diagnosis not present

## 2016-07-16 NOTE — Progress Notes (Signed)
*  PRELIMINARY RESULTS* Echocardiogram 2D Echocardiogram has been performed.  [name] 07/16/2016, 4:36 PM

## 2016-07-17 ENCOUNTER — Encounter: Payer: Self-pay | Admitting: Cardiology

## 2016-07-17 ENCOUNTER — Ambulatory Visit (INDEPENDENT_AMBULATORY_CARE_PROVIDER_SITE_OTHER): Payer: Medicare Other | Admitting: Cardiology

## 2016-07-17 VITALS — BP 130/78 | HR 72 | Ht <= 58 in | Wt 162.8 lb

## 2016-07-17 DIAGNOSIS — Z720 Tobacco use: Secondary | ICD-10-CM | POA: Diagnosis not present

## 2016-07-17 DIAGNOSIS — Z952 Presence of prosthetic heart valve: Secondary | ICD-10-CM | POA: Diagnosis not present

## 2016-07-17 DIAGNOSIS — R0600 Dyspnea, unspecified: Secondary | ICD-10-CM

## 2016-07-17 DIAGNOSIS — I10 Essential (primary) hypertension: Secondary | ICD-10-CM

## 2016-07-17 DIAGNOSIS — I77810 Thoracic aortic ectasia: Secondary | ICD-10-CM | POA: Diagnosis not present

## 2016-07-17 NOTE — Patient Instructions (Signed)
Your physician recommends that you continue on your current medications as directed. Please refer to the Current Medication list given to you today.  Your physician wants you to follow-up in: 1 year with Dr. Skains. You will receive a reminder letter in the mail two months in advance. If you don't receive a letter, please call our office to schedule the follow-up appointment.  

## 2016-07-17 NOTE — Progress Notes (Signed)
[place]. [address], [address], [place]  [address] Phone: [phone] Fax:  [phone]  Date:  07/17/2016   ID:  [name], DOB 1941/09/06, MRN [id number]  PCP:  [name], MD   History of Present Illness: [name] is a 75 y.o. female with coronary artery disease status post bypass x2- grafts to diagonal and obtuse marginal 12/29/08, aortic valve replacement with bioprosthetic #21 valve, hypertension, AVM with prior GI bleed (no futher post AVR)  here for followup.  She had a Holter monitor placed in May 2015 which demonstrated no significant bradycardia, rare PACs. Her last echocardiogram obtained on 03/30/2015 showed EF 75-10%, grade 2 diastolic dysfunction, no regional wall motion abnormality, bioprosthetic aortic valve present and functioning normally, PA peak pressure 40. She had an normal Myoview on 03/30/2015 which showed low risk study, EF greater than 65%, no ischemia or infarction. She was seen by pulmonology, pulmonary function tests showed a normal lung capacity, no airflow obstruction, but markedly decreased diffusion capacity in the face of normal hemoglobin. Per pulmonologist, there is no clear evidence of lung disease, however very concerned about the possibility of pulmonary arterial hypertension. Chest x-ray is unremarkable, BNP was normal.  She underwent right heart cath by Dr. Haroldine Laws on 08/11/2015 which showed PA pressure 53/14, with mean of 29, cardiac output of 3.2, cardiac index 1.9. It appears she has very good filling pressure, however she had mild PAH and moderately elevated PVR. Given relatively normal mixed venous saturation, suspect cardiac output may be falsely low. After discussing with pulmonology, it was decided not to start selective pulmonary dilator at this time. However the symptom persist, would consider cardiopulmonary exercise to further evaluate.  Echocardiogram (repeated multiple times) demonstrated bioprosthetic aortic valve  velocity of 3.0 m/s which is upper limits of normal as well as ascending root dilated aorta of 4.3 cm. Her overall ejection fraction was normal.  In regards to lipids, LDL has been under good control with atorvastatin. Prior lab work reviewed. Previous creatinine 0.6.  She has had continued somatic complaints, aches and pains she states. No exertional anginal symptoms. Left lateral leg pain and we continued to express the importance of exercise and conditioning. We have tried Bystolic instead of metoprolol previously because of previously described fatigue.  Holter monitor in May of 2015 demonstrated no significant bradycardia, rare PACs. No new medications needed.  Continues to have shortness of breath with activity. Continues to smoke. Complains of low back pain, occasional leg pain.   Wt Readings from Last 3 Encounters:  07/17/16 162 lb 12.8 oz (73.8 kg)  07/05/16 163 lb (73.9 kg)  01/08/16 158 lb (71.7 kg)     Past Medical History:  Diagnosis Date  . Abdominal pain, unspecified site   . Anemia    Iron deficiency anemia  . Anxiety   . Aortic stenosis   . CAD (coronary artery disease)    psot bypass x2 to diagonal and obtuse marginal. SVG graft-01/08/2009  . GERD (gastroesophageal reflux disease)   . History of cardiovascular stress test    Myoview 7/16:  Low risk, no ischemia or scar, EF 72%  . History of echocardiogram    Echo 7/16:  EF 55-60%, no RWMA, Gr 2 DD, AVR ok (mean 20 mmHg), PASP 40 mmHg  . History of GI bleed    With AVM  . Hypercholesterolemia   . Hyperlipidemia   . Hypertension   . Iron deficiency anemia   .  Myocardial infarction 2010  . Peripheral vascular disease ([place])   . Shoulder impingement syndrome    Right shoulder    Past Surgical History:  Procedure Laterality Date  . ABDOMINAL HYSTERECTOMY    . AORTIC VALVE REPLACEMENT     Edwards #21  . CARDIAC CATHETERIZATION N/A 08/11/2015   Procedure: Right Heart Cath;  Surgeon: Jolaine Artist, MD;   Location: [place] [facility];  Service: Cardiovascular;  Laterality: N/A;  . CARDIAC SURGERY  2010   , 2 blockages  . CHOLECYSTECTOMY    . CORONARY ARTERY BYPASS GRAFT    . HEEL SPUR EXCISION  2000   left heel  . SPINE SURGERY  2003   Herniated diskectomy  . WRIST SURGERY     right wrist    Current Outpatient Prescriptions  Medication Sig Dispense Refill  . amLODipine (NORVASC) 10 MG tablet TAKE 1 TABLET BY MOUTH EVERY DAY 30 tablet 9  . aspirin 81 MG tablet Take 81 mg by mouth daily.    Marland Kitchen atorvastatin (LIPITOR) 80 MG tablet TAKE 1 TABLET BY MOUTH EVERY DAY 30 tablet 10  . BYSTOLIC 5 MG tablet TAKE 1 TABLET BY MOUTH EVERY DAY 90 tablet 3  . Calcium Carbonate-Vitamin D (CALCIUM 600+D) 600-400 MG-UNIT per tablet Take 1 tablet by mouth daily.    . ferrous sulfate 325 (65 FE) MG tablet Take 325 mg by mouth daily with breakfast.    . furosemide (LASIX) 20 MG tablet Take 20 mg by mouth daily as needed for edema. Reported on 01/08/2016    . glycopyrrolate (ROBINUL) 2 MG tablet Take 2 mg by mouth 2 (two) times daily.  1  . lisinopril-hydrochlorothiazide (PRINZIDE,ZESTORETIC) 10-12.5 MG tablet Take 1 tablet by mouth 2 (two) times daily. 60 tablet 6  . LORazepam (ATIVAN) 0.5 MG tablet Take 0.5 mg by mouth at bedtime as needed for anxiety or sleep.     . meloxicam (MOBIC) 7.5 MG tablet Take 7.5 mg by mouth 2 (two) times daily as needed for pain. for pain  12  . Multiple Vitamin (MULTIVITAMIN) tablet Take 1 tablet by mouth daily.    Marland Kitchen omeprazole (PRILOSEC) 40 MG capsule Take 40 mg by mouth daily.    . potassium chloride (MICRO-K) 10 MEQ CR capsule TAKE 2 CAPSULES BY MOUTH TWICE A DAY 120 capsule 11  . sertraline (ZOLOFT) 100 MG tablet TAKE 1 & 1/2 TABLET BY MOUTH ONCE A DAY  1  . sucralfate (CARAFATE) 1 G tablet TAKE 1 TABLET BY MOUTH 4 TIMES DAILY - WITH MEALS AND AT BEDTIME. 90 tablet 1  . tiotropium (SPIRIVA HANDIHALER) 18 MCG inhalation capsule Place 1 capsule (18 mcg total) into inhaler and  inhale every morning. 30 capsule 12  . traMADol (ULTRAM) 50 MG tablet Take 50 mg by mouth every 6 (six) hours as needed for moderate pain.     No current facility-administered medications for this visit.     Allergies:    Allergies  Allergen Reactions  . Codeine Other (See Comments)    Feeling intoxicated    Social History:  The patient  reports that she has been smoking Cigarettes.  She has a 52.00 pack-year smoking history. She has never used smokeless tobacco. She reports that she does not drink alcohol or use drugs.   ROS:  Please see the history of present illness.   Denies any syncope, bleeding, orthopnea, PND.  Positive for fatigue, chest pain, leg swelling, shortness of breath, back pain, abdominal pain, blood  in stool, cough.   PHYSICAL EXAM: VS:  BP 130/78   Pulse 72   Ht '4\' 9"'$  (1.448 m)   Wt 162 lb 12.8 oz (73.8 kg)   LMP  (LMP Unknown)   BMI 35.23 kg/m  Well nourished, well developed, in no acute distress  HEENT: normal  Neck: no JVD, radiation of aortic murmur  Cardiac:  normal S1, S2; RRR; 2/6 systolic murmur Right upper sternal border. Scar noted. Lungs:  Seems to have poor air movement B, subtle wheeze right posterior lung.  clear to auscultation bilaterally, no wheezing, rhonchi or rales  Abd: soft, nontender, no hepatomegaly  Ext: no edema  Skin: warm and dry  Neuro: no focal abnormalities noted  EKG: 07/14/14-sinus rhythm, minimal sinus arrhythmia, no other abnormalities. 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 No significant change from prior.  Right heart cath 08/11/15 (dx SOB) Findings:  RA = 7 RV = 49/0/10 PA = 53/14 (29) PCW = 9 Fick cardiac output/index = 3.2/1.9 PVR = 6.4 WU Ao sat = 96% PA sat = 56%, 57%  Assessment: 1. Normal R and L sided filling pressures 2. Mild PAH with moderately elevated PVR 3. Low cardiac  output  Plan/Discussion:  Filling pressures look great. She does have mild PAH moderately elevated PVR. Given relatively normal mixed venous saturations suspect Fick cardiac output may be falsely low and this will raise PVR. Would not start selective pulmonary dilators at this time. If symptoms persist could consider cardiopulmonary exercise testing to further evaluate. Will d/w Dr. Lake Bells.  Bensimhon, Daniel,MD  ECHO : 07/16/16  - Left ventricle: The cavity size was normal. There was moderate   concentric hypertrophy. Systolic function was normal. The   estimated ejection fraction was in the range of 60% to 65%. Wall   motion was normal; there were no regional wall motion   abnormalities. Doppler parameters are consistent with abnormal   left ventricular relaxation (grade 1 diastolic dysfunction). - Aortic valve: A prosthesis was present and functioning normally.   The prosthesis had a normal range of motion. The sewing ring   appeared normal, had no rocking motion, and showed no evidence of   dehiscence. Peak velocity (S): 289 cm/s. (within normal limits   post valve replacement) Mean gradient (S): 19 mm Hg. - Mitral valve: Calcified annulus. - Tricuspid valve: There was trivial regurgitation. - Pulmonary arteries: Systolic pressure was mildly increased. PA   peak pressure: 38 mm Hg (S).  Impressions:  - Similar aortic valve gradients when compared to prior study.  LABS: 06/28/13 - LDL 67.  ASSESSMENT AND PLAN:  1. Coronary artery disease status post bypass-Still has baseline shortness of breath. Continue with aspirin., Beta blocker (bystolic), statin, ACE inhibitor. Previous bypass anatomy reviewed. Secondary prevention. 2. Dyspnea-Dr. Anastasia Pall note reviewed. No evidence of obstruction on pulmonary function test. She does have occasional wheezing she states. Her air movement does seem decreased on physical exam by auscultation.Right catheterization reviewed as above. Her  previous measurement was 40 mmHg, mild. Smoking cessation nonetheless is key as well as blood pressure control. Continue to encourage exercise. Gave her recommendation for Coricidin HBP for colds.  3. Aortic valve replacement-upper normal velocity on echocardiogram. Continue to monitor. Murmur appreciated on exam. Discussed with Dr. [name]. 4. AVM-prior GI bleed. Once aortic valve replaced, no further bleeding episodes. This can be related to lack of degradation of von Willebrand's factor. She understands to take  dental prophylaxis antibiotics. 5. Hypertension essential-under great control. No changes made. Renal function has been stable, 0.7 creatinine.  6. Dysphagia-had endoscopies with Dr. Ardis Hughs. Mild gastritis noted. Recommended to chew food well, small bites. 7. Hyperlipidemia- Overall good control. High-dose statin. 8. Tobacco use-encourage tobacco cessation. 5 cigs a day.  I counseled her on picking a stop date, trying to control cravings. Her daughter was present. 9. Obesity-encourage weight loss. Discussed BMI with her. Decreasing carbohydrates. Diet. Hard for her to exercise. 10. Dilated aortic root-we will continue to monitor with echocardiogram. Stable 11. 12 month follow  Signed, [name], MD [facility]  07/17/2016 10:36 AM

## 2016-07-23 ENCOUNTER — Telehealth: Payer: Self-pay | Admitting: *Deleted

## 2016-07-23 MED ORDER — LISINOPRIL-HYDROCHLOROTHIAZIDE 20-25 MG PO TABS: ORAL_TABLET | ORAL | 3 refills | Status: DC

## 2016-07-23 NOTE — Telephone Encounter (Signed)
I discussed medication change with pt, pt agreed to change and verbalized understanding. Pt requested 90 day prescription to [facility].

## 2016-07-23 NOTE — Telephone Encounter (Signed)
Dr [name] received copy of letter to patient from [facility] stating that Lisinopril/HCTZ 10-12.5 bid is not covered, will only pay QD dosing.   [facility] pharmacist recommended change to Lisinopril/HCTZ 20/25 with directions 1/2 tablet bid. Dr [name] agreed with this change.   LMTCB for pt to discuss.

## 2016-07-28 ENCOUNTER — Other Ambulatory Visit: Payer: Self-pay | Admitting: Cardiology

## 2016-08-12 ENCOUNTER — Telehealth: Payer: Self-pay | Admitting: Cardiology

## 2016-08-12 ENCOUNTER — Other Ambulatory Visit: Payer: Self-pay | Admitting: *Deleted

## 2016-08-12 MED ORDER — LISINOPRIL-HYDROCHLOROTHIAZIDE 20-25 MG PO TABS: ORAL_TABLET | ORAL | 3 refills | Status: DC

## 2016-08-12 NOTE — Telephone Encounter (Signed)
New message    *STAT* If patient is at the pharmacy, call can be transferred to refill team.   1. Which medications need to be refilled? (please list name of each medication and dose if known) lisinopril HCTZ 10.12.'5mg'$   2. Which pharmacy/location (including street and city if local pharmacy) is medication to be sent to? [facility] 3. Do they need a 30 day or 90 day supply?  [place]

## 2016-08-13 ENCOUNTER — Other Ambulatory Visit: Payer: Self-pay | Admitting: Cardiology

## 2016-08-13 ENCOUNTER — Telehealth: Payer: Self-pay | Admitting: Cardiology

## 2016-08-13 MED ORDER — LISINOPRIL-HYDROCHLOROTHIAZIDE 20-25 MG PO TABS: ORAL_TABLET | ORAL | 3 refills | Status: DC

## 2016-08-13 NOTE — Telephone Encounter (Signed)
Called pt and left message asking pt to call back concerning her pharmacy. [facility] stated that the pt is not a member and that the pt would have to contact their insurance to become a member to receive their medication with [facility]. Pt's medication of Lisinopril-hydrochlorothiazide 20-25 mg tablet was sent to [place].

## 2016-08-19 ENCOUNTER — Other Ambulatory Visit: Payer: Self-pay | Admitting: Cardiology
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
## 2016-09-25 ENCOUNTER — Ambulatory Visit (INDEPENDENT_AMBULATORY_CARE_PROVIDER_SITE_OTHER): Payer: Medicare Other

## 2016-09-25 ENCOUNTER — Encounter (INDEPENDENT_AMBULATORY_CARE_PROVIDER_SITE_OTHER): Payer: Self-pay | Admitting: Specialist

## 2016-09-25 ENCOUNTER — Ambulatory Visit (INDEPENDENT_AMBULATORY_CARE_PROVIDER_SITE_OTHER): Payer: Medicare Other | Admitting: Specialist

## 2016-09-25 VITALS — BP 102/63 | HR 64 | Ht <= 58 in | Wt 163.0 lb

## 2016-09-25 DIAGNOSIS — M4316 Spondylolisthesis, lumbar region: Secondary | ICD-10-CM

## 2016-09-25 DIAGNOSIS — R202 Paresthesia of skin: Secondary | ICD-10-CM | POA: Diagnosis not present

## 2016-09-25 DIAGNOSIS — M5441 Lumbago with sciatica, right side: Secondary | ICD-10-CM

## 2016-09-25 DIAGNOSIS — M160 Bilateral primary osteoarthritis of hip: Secondary | ICD-10-CM

## 2016-09-25 DIAGNOSIS — R200 Anesthesia of skin: Secondary | ICD-10-CM | POA: Diagnosis not present

## 2016-09-25 MED ORDER — METHYLPREDNISOLONE 4 MG PO TBPK: ORAL_TABLET | ORAL | 0 refills | Status: DC

## 2016-09-25 MED ORDER — TRAMADOL HCL 50 MG PO TABS: 50.0000 mg | ORAL_TABLET | Freq: Four times a day (QID) | ORAL | 0 refills | Status: DC | PRN

## 2016-09-25 NOTE — Patient Instructions (Addendum)
Avoid bending, stooping and avoid lifting weights greater than 10 lbs. Avoid prolong standing and walking. Avoid frequent bending and stooping  No lifting greater than 10 lbs. May use ice or moist heat for pain. Weight loss is of benefit. Handicap license is approved. MRI of the lumbar spine is to be scheduled to evaluate for spinal stenosis
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
## 2016-10-02 ENCOUNTER — Ambulatory Visit
Admission: RE | Admit: 2016-10-02 | Discharge: 2016-10-02 | Disposition: A | Discharge status: Home / Self Care | Payer: Medicare Other | Source: Ambulatory Visit | Attending: Specialist | Admitting: Specialist

## 2016-10-02 DIAGNOSIS — R202 Paresthesia of skin: Secondary | ICD-10-CM

## 2016-10-02 DIAGNOSIS — M5441 Lumbago with sciatica, right side: Secondary | ICD-10-CM

## 2016-10-02 DIAGNOSIS — M4316 Spondylolisthesis, lumbar region: Secondary | ICD-10-CM

## 2016-10-02 DIAGNOSIS — R200 Anesthesia of skin: Secondary | ICD-10-CM

## 2016-10-02 MED ORDER — GADOBENATE DIMEGLUMINE 529 MG/ML IV SOLN
15.0000 mL | Freq: Once | INTRAVENOUS | Status: AC | PRN
  Administered 2016-10-02: 15 mL via INTRAVENOUS

## 2016-11-13 ENCOUNTER — Ambulatory Visit (INDEPENDENT_AMBULATORY_CARE_PROVIDER_SITE_OTHER): Payer: Medicare Other | Admitting: Specialist

## 2016-11-13 ENCOUNTER — Encounter (INDEPENDENT_AMBULATORY_CARE_PROVIDER_SITE_OTHER): Payer: Self-pay | Admitting: Specialist

## 2016-11-13 VITALS — BP 100/57 | HR 71 | Ht <= 58 in | Wt 163.0 lb

## 2016-11-13 DIAGNOSIS — R202 Paresthesia of skin: Secondary | ICD-10-CM

## 2016-11-13 DIAGNOSIS — M4316 Spondylolisthesis, lumbar region: Secondary | ICD-10-CM

## 2016-11-13 DIAGNOSIS — M1611 Unilateral primary osteoarthritis, right hip: Secondary | ICD-10-CM

## 2016-11-13 DIAGNOSIS — M542 Cervicalgia: Secondary | ICD-10-CM

## 2016-11-13 DIAGNOSIS — M48062 Spinal stenosis, lumbar region with neurogenic claudication: Secondary | ICD-10-CM

## 2016-11-13 DIAGNOSIS — R200 Anesthesia of skin: Secondary | ICD-10-CM

## 2016-11-13 DIAGNOSIS — M5441 Lumbago with sciatica, right side: Secondary | ICD-10-CM | POA: Diagnosis not present

## 2016-11-13 MED ORDER — TRAMADOL HCL 50 MG PO TABS: 50.0000 mg | ORAL_TABLET | Freq: Four times a day (QID) | ORAL | 0 refills | Status: DC | PRN

## 2016-11-13 MED ORDER — MELOXICAM 7.5 MG PO TABS: 7.5000 mg | ORAL_TABLET | Freq: Two times a day (BID) | ORAL | 3 refills | Status: DC | PRN

## 2016-11-13 NOTE — Patient Instructions (Signed)
The main ways of treat osteoarthritis, that are found to be success. Weight loss helps to decrease pain. Exercise is important to maintaining cartilage and thickness and strengthening. Ice is okay  In afternoon and evening and hot shower in the am  Avoid bending, stooping and avoid lifting weights greater than 10 lbs. Avoid prolong standing and walking. Avoid frequent bending and stooping  No lifting greater than 10 lbs. May use ice or moist heat for pain. Weight loss is of benefit. Handicap license is approved.  Will schedule you to se Dr. Ninfa Linden to consider a right total hip replacement.

## 2016-11-13 NOTE — Progress Notes (Signed)
Office Visit Note   Patient: [name]           Date of Birth: 09/03/1941           MRN: [id number] Visit Date: 11/13/2016              Requested by: Donald Prose, MD [place], [place] [address] PCP: [name], MD   Assessment & Plan: Visit Diagnoses:  1. Cervicalgia   2. Unilateral primary osteoarthritis, right hip   3. Spinal stenosis of lumbar region with neurogenic claudication     Plan: The main ways of treat osteoarthritis, that are found to be success. Weight loss helps to decrease pain. Exercise is important to maintaining cartilage and thickness and strengthening. Ice is okay  In afternoon and evening and hot shower in the am  Avoid bending, stooping and avoid lifting weights greater than 10 lbs. Avoid prolong standing and walking. Avoid frequent bending and stooping  No lifting greater than 10 lbs. May use ice or moist heat for pain. Weight loss is of benefit. Handicap license is approved.  Will schedule you to se Dr. Ninfa Linden to consider a right total hip replacement.   Follow-Up Instructions: Return in about 4 weeks (around 12/11/2016).   Orders:  No orders of the defined types were placed in this encounter.  No orders of the defined types were placed in this encounter.     Procedures: No procedures performed   Clinical Data: Findings:  MRI with moderate right L3-4 subarticular stenosis, grade 1 slip L3-4 and L4-5,  Left L4 foramenal narrowing. Previous radiographs from 1/18 with severe right hip OA with joint line narrowing and osteophytes off the right femoral head inferolateral and medial, subchondral sclerosis, bone on bone Appearance medially, head is misshapened.    Subjective: Chief Complaint  Patient presents with  . Lower Back - Follow-up    MRI Review    Ms. [name] is here to follow up after her MRI of her Lumbar spine. She does states that her pain is worsening since her last office visit. Has  pain with standing and ambulation, unable to walk further than the mail box and back. Pain with sleeping on the right side. Unable to reach right foot for bathing and for donning shoes and socks. Difficulty ascending slopes. Does have SOB and history of heart condition.     Review of Systems  Constitutional: Negative.   HENT: Negative.   Eyes: Negative.   Respiratory: Negative.   Cardiovascular: Negative.   Gastrointestinal: Negative.   Endocrine: Negative.   Genitourinary: Negative.   Musculoskeletal: Negative.   Skin: Negative.   Allergic/Immunologic: Negative.   Neurological: Negative.   Hematological: Negative.   Psychiatric/Behavioral: Negative.      Objective: Vital Signs: BP (!) 100/57 (BP Location: Left Arm, Patient Position: Sitting)   Pulse 71   Ht '4\' 5"'$  (1.346 m)   Wt 163 lb (73.9 kg)   LMP  (LMP Unknown)   BMI 40.80 kg/m   Physical Exam  Constitutional: She is oriented to person, place, and time. She appears well-developed and well-nourished.  HENT:  Head: Normocephalic and atraumatic.  Eyes: EOM are normal. Pupils are equal, round, and reactive to light.  Neck: Normal range of motion. Neck supple.  Pulmonary/Chest: Effort normal and breath sounds normal.  Abdominal: Soft. Bowel sounds are normal.  Neurological: She is alert and oriented to person, place, and time.  Skin: Skin is warm and dry.  Psychiatric: She has a normal mood and affect. Her behavior is normal. Judgment and thought content normal.    Right Hip Exam   Tenderness  The patient is experiencing tenderness in the anterior and lateral.  Range of Motion  Extension: abnormal  Flexion: abnormal  Internal Rotation: abnormal  External Rotation: abnormal  Abduction: abnormal   Muscle Strength  Abduction: 5/5  Adduction: 5/5  Flexion: 5/5   Tests  FABER: positive Ober: positive  Other  Erythema: absent Scars: absent Sensation: normal Pulse: present   Left Hip Exam  Left hip  exam is normal.   Back Exam   Tenderness  The patient is experiencing no tenderness.   Range of Motion  Extension: abnormal  Flexion: abnormal  Lateral Bend Right: abnormal  Lateral Bend Left: abnormal  Rotation Right: abnormal  Rotation Left: abnormal   Muscle Strength  Right Quadriceps:  5/5  Left Quadriceps:  5/5  Right Hamstrings:  5/5  Left Hamstrings:  5/5   Tests  Straight leg raise right: negative Straight leg raise left: negative  Reflexes  Patellar: normal Achilles: normal Babinski's sign: normal   Other  Toe Walk: normal Heel Walk: normal Gait: normal       Specialty Comments:  No specialty comments available.  Imaging: No results found.   PMFS History: Patient Active Problem List   Diagnosis Date Noted  . PAH (pulmonary artery hypertension)   . Epigastric burning sensation 04/19/2015  . Chronic diarrhea 04/19/2015  . History of GI bleed 03/14/2015  . Long-term use of high-risk medication 01/11/2015  . Dysphagia 01/11/2015  . S/P AVR 01/11/2015  . Dyspnea 01/11/2015  . Tobacco abuse 01/11/2015  . Numbness of anterior thigh-Left 12/09/2014  . Pain in joint, lower leg 12/09/2014  . Essential hypertension 07/14/2014  . Coronary artery disease involving coronary bypass graft of native heart without angina pectoris 07/14/2014  . Dilated aortic root ([place]) 07/14/2014  . H/O aortic valve replacement 07/14/2014  . Tobacco use 07/14/2014  . Obesity 07/14/2014  . Palpitations 01/03/2014  . Aortic valve disorder 06/19/2013  . Heart valve replaced by other means 06/19/2013  . Flatulence, eructation, and gas pain 06/19/2013  . Pure hypercholesterolemia 06/19/2013  . 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 Hypopotassemia 06/19/2013  . Anemia, unspecified 06/19/2013  . Anxiety state, unspecified 06/19/2013  . Esophageal reflux 06/19/2013  . Personal history of other diseases of digestive system 06/19/2013  . Iron  deficiency anemia, unspecified 06/19/2013  . Proteinuria 06/19/2013  . Arthropathy, unspecified, site unspecified 06/19/2013  . Major depressive disorder, single episode, unspecified 06/19/2013  . 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 Tobacco use disorder 06/19/2013  . Peripheral vascular disease ([place]) 10/14/2012  . Atherosclerosis of aorta ([place]) 10/14/2012  . Pain in limb 10/14/2012  . Atherosclerosis of abdominal aorta ([place]) 10/16/2011  . PVD (peripheral vascular disease) ([place]) 10/16/2011   Past Medical History:  Diagnosis Date  . Abdominal pain, unspecified site   . Anemia    Iron deficiency anemia  . Anxiety   . Aortic stenosis   . CAD (coronary artery disease)    psot bypass x2 to diagonal and obtuse marginal. SVG graft-01/08/2009  . GERD (gastroesophageal reflux disease)   . History of cardiovascular stress test    Myoview 7/16:  Low risk, no ischemia or scar, EF 72%  . History of echocardiogram    Echo 7/16:  EF 55-60%, no RWMA, Gr 2 DD, AVR ok (mean 20 mmHg), PASP 40 mmHg  .  History of GI bleed    With AVM  . Hypercholesterolemia   . Hyperlipidemia   . Hypertension   . Iron deficiency anemia   . Myocardial infarction 2010  . Peripheral vascular disease ([place])   . Shoulder impingement syndrome    Right shoulder    Family History  Problem Relation Age of Onset  . Hypertension Mother   . Kidney disease Mother   . Hypertension Father   . Hypertension Sister   . Hypertension Brother   . Hypertension Daughter   . Diabetes Brother   . Heart attack Neg Hx   . Stroke Neg Hx     Past Surgical History:  Procedure Laterality Date  . ABDOMINAL HYSTERECTOMY    . AORTIC VALVE REPLACEMENT     [facility] #21  . CARDIAC CATHETERIZATION N/A 08/11/2015   Procedure: Right Heart Cath;  Surgeon: Jolaine Artist, MD;  Location: [facility];  Service: Cardiovascular;  Laterality: N/A;  . CARDIAC SURGERY  2010   , 2 blockages  . CHOLECYSTECTOMY    . CORONARY ARTERY BYPASS  GRAFT    . HEEL SPUR EXCISION  2000   left heel  . SPINE SURGERY  2003   Herniated diskectomy  . WRIST SURGERY     right wrist   Social History   Occupational History  . Retired    Social History Main Topics  . Smoking status: Current Some Day Smoker    Packs/day: 1.00    Years: 52.00    Types: Cigarettes  . Smokeless tobacco: Never Used     Comment: has not set quit date  . Alcohol use No  . Drug use: No  . Sexual activity: Not on file

## 2016-11-13 NOTE — Addendum Note (Signed)
Addended by: Basil Dess on: 11/13/2016 10:21 AM   Modules accepted: Orders

## 2016-11-15 ENCOUNTER — Other Ambulatory Visit (HOSPITAL_COMMUNITY): Payer: Self-pay | Admitting: Cardiology

## 2016-12-02 ENCOUNTER — Telehealth: Payer: Self-pay | Admitting: *Deleted

## 2016-12-02 NOTE — Telephone Encounter (Signed)
Lauren member advocate for [facility] called and stated that the patient is going to have a gap in coverage and she is requesting samples of spiriva on behalf of the patient. Please call patient if these are available.

## 2016-12-03 NOTE — Telephone Encounter (Signed)
As this is not a HF medication it would be best suited for patient to contact PCP and or pulmonary for further assistance, our office does not have samples of spiriva

## 2016-12-05 ENCOUNTER — Encounter: Payer: Self-pay | Admitting: Family

## 2016-12-05 NOTE — Telephone Encounter (Signed)
PATIENT AWARE AND VOICED UNDERSTANDING

## 2016-12-11 ENCOUNTER — Ambulatory Visit (INDEPENDENT_AMBULATORY_CARE_PROVIDER_SITE_OTHER): Payer: Medicare Other | Admitting: Orthopaedic Surgery

## 2016-12-12 ENCOUNTER — Other Ambulatory Visit (INDEPENDENT_AMBULATORY_CARE_PROVIDER_SITE_OTHER): Payer: Self-pay | Admitting: Specialist

## 2016-12-12 DIAGNOSIS — M5441 Lumbago with sciatica, right side: Secondary | ICD-10-CM

## 2016-12-12 DIAGNOSIS — R202 Paresthesia of skin: Secondary | ICD-10-CM

## 2016-12-12 DIAGNOSIS — M4316 Spondylolisthesis, lumbar region: Secondary | ICD-10-CM

## 2016-12-12 DIAGNOSIS — R200 Anesthesia of skin: Secondary | ICD-10-CM

## 2016-12-13 NOTE — Telephone Encounter (Signed)
Tramadol refill request 

## 2016-12-17 NOTE — Telephone Encounter (Signed)
Called rx to [facility].

## 2016-12-17 NOTE — Telephone Encounter (Signed)
I called Rx to [facility]

## 2016-12-18 ENCOUNTER — Encounter (HOSPITAL_COMMUNITY): Payer: Medicare Other

## 2016-12-18 ENCOUNTER — Ambulatory Visit: Payer: PPO | Admitting: Family

## 2017-01-01 ENCOUNTER — Ambulatory Visit (INDEPENDENT_AMBULATORY_CARE_PROVIDER_SITE_OTHER): Payer: Self-pay

## 2017-01-01 ENCOUNTER — Encounter (INDEPENDENT_AMBULATORY_CARE_PROVIDER_SITE_OTHER): Payer: Self-pay | Admitting: Orthopaedic Surgery

## 2017-01-01 ENCOUNTER — Ambulatory Visit (INDEPENDENT_AMBULATORY_CARE_PROVIDER_SITE_OTHER): Payer: Medicare Other | Admitting: Physician Assistant

## 2017-01-01 DIAGNOSIS — M25551 Pain in right hip: Secondary | ICD-10-CM

## 2017-01-01 DIAGNOSIS — M160 Bilateral primary osteoarthritis of hip: Secondary | ICD-10-CM | POA: Diagnosis not present

## 2017-01-01 MED ORDER — TRIAMCINOLONE ACETONIDE 40 MG/ML IJ SUSP
80.0000 mg | INTRAMUSCULAR | Status: AC | PRN
  Administered 2017-01-01: 80 mg via INTRA_ARTICULAR

## 2017-01-01 MED ORDER — BUPIVACAINE HCL 0.5 % IJ SOLN
3.0000 mL | INTRAMUSCULAR | Status: AC | PRN
  Administered 2017-01-01: 3 mL via INTRA_ARTICULAR

## 2017-01-01 NOTE — Progress Notes (Signed)
Office Visit Note   Patient: [name]           Date of Birth: 1941/05/06           MRN: [id number] Visit Date: 01/01/2017              Requested by: Donald Prose, MD [place], [place] [id number] PCP: [name], MD   Assessment & Plan: Visit Diagnoses:  1. Pain in right hip   2. Primary osteoarthritis of both hips     Plan: We will send her for an intra-articular injection with Dr. Ernestina Patches in right hip. We'll see her back in the 2-4 weeks to check progress lack of.  Follow-Up Instructions: Return in about 4 weeks (around 01/29/2017).   Orders:  Orders Placed This Encounter  Procedures  . Large Joint Injection/Arthrocentesis  . XR C-ARM NO REPORT   No orders of the defined types were placed in this encounter.     Procedures: No procedures performed   Clinical Data: No additional findings.   Subjective: Right hip pain  HPI  [name] is 76 year old female seen at the request of Dr. Louanne Skye  for right hip arthritis. His low back pain with spinal stenosis and also has a significant cardiac history. He states she's been having pain in her right hip for years getting progressively worse. She does have groin pain. She has difficulty donning shoes socks. As typically getting in and out of a car due to the right hip pain. Radiographs of her lumbar spine dated 09/25/2016 P view of the lumbar spine shows a complete pelvis. Severe arthritic changes of the right hip with flattening of the femoral head. No acute fracture noted. She has moderate narrowing of the left hip joint.  Review of Systems Positive for shortness of breath with exertion. Denies any chest pain, orthopnea, nausea, vomiting, fevers or chills. Please see history of present illness  Objective: Vital Signs: LMP  (LMP Unknown)   Physical Exam  Constitutional: She is oriented to person, place, and time. She appears well-developed and well-nourished. No distress.    Pulmonary/Chest: Effort normal.  Neurological: She is alert and oriented to person, place, and time.  Psychiatric: She has a normal mood and affect.    Ortho Exam Slightly Limited range of motion left hip. Severely limited range of motion right hip. Pain with internal rotation right hip. Specialty Comments:  No specialty comments available.  Imaging: Xr C-arm No Report  Result Date: 01/01/2017 Please see Notes or Procedures tab for imaging impression.    PMFS History: Patient Active Problem List   Diagnosis Date Noted  . PAH (pulmonary artery hypertension)   . Epigastric burning sensation 04/19/2015  . Chronic diarrhea 04/19/2015  . History of GI bleed 03/14/2015  . Long-term use of high-risk medication 01/11/2015  . Dysphagia 01/11/2015  . S/P AVR 01/11/2015  . Dyspnea 01/11/2015  . Tobacco abuse 01/11/2015  . Numbness of anterior thigh-Left 12/09/2014  . Pain in joint, lower leg 12/09/2014  . Essential hypertension 07/14/2014  . Coronary artery disease involving coronary bypass graft of native heart without angina pectoris 07/14/2014  . Dilated aortic root ([place]) 07/14/2014  . H/O aortic valve replacement 07/14/2014  . Tobacco use 07/14/2014  . Obesity 07/14/2014  . Palpitations 01/03/2014  . Aortic valve disorder 06/19/2013  . Heart valve replaced by other means 06/19/2013  . Flatulence, eructation, and gas pain 06/19/2013  . Pure hypercholesterolemia 06/19/2013  . Chronic total  occlusion of coronary artery(414.2) 06/19/2013  . Hypopotassemia 06/19/2013  . Anemia, unspecified 06/19/2013  . Anxiety state, unspecified 06/19/2013  . Esophageal reflux 06/19/2013  . Personal history of other diseases of digestive system 06/19/2013  . Iron deficiency anemia, unspecified 06/19/2013  . Proteinuria 06/19/2013  . Arthropathy, unspecified, site unspecified 06/19/2013  . Major depressive disorder, single episode, unspecified 06/19/2013  . 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  Tobacco use disorder 06/19/2013  . Peripheral vascular disease ([place]) 10/14/2012  . Atherosclerosis of aorta ([place]) 10/14/2012  . Pain in limb 10/14/2012  . Atherosclerosis of abdominal aorta ([place]) 10/16/2011  . PVD (peripheral vascular disease) ([place]) 10/16/2011   Past Medical History:  Diagnosis Date  . Abdominal pain, unspecified site   . Anemia    Iron deficiency anemia  . Anxiety   . Aortic stenosis   . CAD (coronary artery disease)    psot bypass x2 to diagonal and obtuse marginal. SVG graft-01/08/2009  . GERD (gastroesophageal reflux disease)   . History of cardiovascular stress test    Myoview 7/16:  Low risk, no ischemia or scar, EF 72%  . History of echocardiogram    Echo 7/16:  EF 55-60%, no RWMA, Gr 2 DD, AVR ok (mean 20 mmHg), PASP 40 mmHg  . History of GI bleed    With AVM  . Hypercholesterolemia   . Hyperlipidemia   . Hypertension   . Iron deficiency anemia   . Myocardial infarction 2010  . Peripheral vascular disease ([place])   . Shoulder impingement syndrome    Right shoulder    Family History  Problem Relation Age of Onset  . Hypertension Mother   . Kidney disease Mother   . Hypertension Father   . Hypertension Sister   . Hypertension Brother   . Hypertension Daughter   . Diabetes Brother   . Heart attack Neg Hx   . Stroke Neg Hx     Past Surgical History:  Procedure Laterality Date  . ABDOMINAL HYSTERECTOMY    . AORTIC VALVE REPLACEMENT     [facility] #21  . CARDIAC CATHETERIZATION N/A 08/11/2015   Procedure: Right Heart Cath;  Surgeon: Jolaine Artist, MD;  Location: [facility];  Service: Cardiovascular;  Laterality: N/A;  . CARDIAC SURGERY  2010   , 2 blockages  . CHOLECYSTECTOMY    . CORONARY ARTERY BYPASS GRAFT    . HEEL SPUR EXCISION  2000   left heel  . SPINE SURGERY  2003   Herniated diskectomy  . WRIST SURGERY     right wrist   Social History   Occupational History  . Retired    Social History Main Topics  . Smoking  status: Current Some Day Smoker    Packs/day: 1.00    Years: 52.00    Types: Cigarettes  . Smokeless tobacco: Never Used     Comment: has not set quit date  . Alcohol use No  . Drug use: No  . Sexual activity: Not on file

## 2017-01-01 NOTE — Patient Instructions (Signed)

## 2017-01-01 NOTE — Progress Notes (Signed)
CHAKA BOYSON - 76 y.o. female MRN [id number]  Date of birth: 10-May-1941  Office Visit Note: Visit Date: 01/01/2017 PCP: [name], MD Referred by: Donald Prose, MD  Subjective: No chief complaint on file.  HPI: Mrs. Sistare is a very pleasant 76 year old female with severe right hip pain and in stays osteoarthritic changes of the right hip. She's been followed by Dr. Juanna Cao, PA. Artis Delay saw her today and requested work and diagnostic of therapeutic anesthetic hip arthrogram on the right.    ROS Otherwise per HPI.  Assessment & Plan: Visit Diagnoses:  1. Pain in right hip   2. Primary osteoarthritis of both hips     Plan: Findings:  Diagnostic of therapeutic right hip anesthetic arthrogram with fluoroscopic guidance. Patient did get some relief during the anesthetic phase but still having significant mechanical difficulty.    Meds & Orders: No orders of the defined types were placed in this encounter.   Orders Placed This Encounter  Procedures  . Large Joint Injection/Arthrocentesis  . XR C-ARM NO REPORT    Follow-up: Return if symptoms worsen or fail to improve, 2week.   Procedures: Hip anesthetic arthrogram Date/Time: 01/01/2017 11:15 AM Performed by: Magnus Sinning Authorized by: Magnus Sinning   Consent Given by:  Patient Site marked: the procedure site was marked   Timeout: prior to procedure the correct patient, procedure, and site was verified   Indications:  Pain and diagnostic evaluation Location:  Hip Site:  R hip joint Prep: patient was prepped and draped in usual sterile fashion   Needle Size:  22 G Approach:  Anterior Ultrasound Guidance: No   Fluoroscopic Guidance: No   Arthrogram: Yes   Medications:  3 mL bupivacaine 0.5 %; 80 mg triamcinolone acetonide 40 MG/ML Aspiration Attempted: Yes   Patient tolerance:  Patient tolerated the procedure well with no immediate complications  Arthrogram demonstrated excellent flow of contrast  throughout the joint surface without extravasation or obvious defect.  Fluoroscopic imaging shows end-stage osteoarthritis. The patient had relief of symptoms during the anesthetic phase of the injection.      No notes on file   Clinical History: No specialty comments available.  She reports that she has been smoking Cigarettes.  She has a 52.00 pack-year smoking history. She has never used smokeless tobacco. No results for input(s): HGBA1C, LABURIC in the last 8760 hours.  Objective:  VS:  HT:    WT:   BMI:     BP:   HR: bpm  TEMP: ( )  RESP:  Physical Exam  Musculoskeletal:  Patient ambulates with a cane with an antalgic gait to the right. She has significant pain with hip rotation.    Ortho Exam Imaging: No results found.  Past Medical/Family/Surgical/Social History: Medications & Allergies reviewed per EMR Patient Active Problem List   Diagnosis Date Noted  . PAH (pulmonary artery hypertension)   . Epigastric burning sensation 04/19/2015  . Chronic diarrhea 04/19/2015  . History of GI bleed 03/14/2015  . Long-term use of high-risk medication 01/11/2015  . Dysphagia 01/11/2015  . S/P AVR 01/11/2015  . Dyspnea 01/11/2015  . Tobacco abuse 01/11/2015  . Numbness of anterior thigh-Left 12/09/2014  . Pain in joint, lower leg 12/09/2014  . Essential hypertension 07/14/2014  . Coronary artery disease involving coronary bypass graft of native heart without angina pectoris 07/14/2014  . Dilated aortic root ([place]) 07/14/2014  . H/O aortic valve replacement 07/14/2014  . Tobacco use 07/14/2014  . Obesity 07/14/2014  .  Palpitations 01/03/2014  . Aortic valve disorder 06/19/2013  . Heart valve replaced by other means 06/19/2013  . Flatulence, eructation, and gas pain 06/19/2013  . Pure hypercholesterolemia 06/19/2013  . 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 Hypopotassemia 06/19/2013  . Anemia, unspecified 06/19/2013  . Anxiety state, unspecified  06/19/2013  . Esophageal reflux 06/19/2013  . Personal history of other diseases of digestive system 06/19/2013  . Iron deficiency anemia, unspecified 06/19/2013  . Proteinuria 06/19/2013  . Arthropathy, unspecified, site unspecified 06/19/2013  . Major depressive disorder, single episode, unspecified 06/19/2013  . 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 Tobacco use disorder 06/19/2013  . Peripheral vascular disease ([place]) 10/14/2012  . Atherosclerosis of aorta ([place]) 10/14/2012  . Pain in limb 10/14/2012  . Atherosclerosis of abdominal aorta ([place]) 10/16/2011  . PVD (peripheral vascular disease) ([place]) 10/16/2011   Past Medical History:  Diagnosis Date  . Abdominal pain, unspecified site   . Anemia    Iron deficiency anemia  . Anxiety   . Aortic stenosis   . CAD (coronary artery disease)    psot bypass x2 to diagonal and obtuse marginal. SVG graft-01/08/2009  . GERD (gastroesophageal reflux disease)   . History of cardiovascular stress test    Myoview 7/16:  Low risk, no ischemia or scar, EF 72%  . History of echocardiogram    Echo 7/16:  EF 55-60%, no RWMA, Gr 2 DD, AVR ok (mean 20 mmHg), PASP 40 mmHg  . History of GI bleed    With AVM  . Hypercholesterolemia   . Hyperlipidemia   . Hypertension   . Iron deficiency anemia   . Myocardial infarction 2010  . Peripheral vascular disease ([place])   . Shoulder impingement syndrome    Right shoulder   Family History  Problem Relation Age of Onset  . Hypertension Mother   . Kidney disease Mother   . Hypertension Father   . Hypertension Sister   . Hypertension Brother   . Hypertension Daughter   . Diabetes Brother   . Heart attack Neg Hx   . Stroke Neg Hx    Past Surgical History:  Procedure Laterality Date  . ABDOMINAL HYSTERECTOMY    . AORTIC VALVE REPLACEMENT     [place] #21  . CARDIAC CATHETERIZATION N/A 08/11/2015   Procedure: Right Heart Cath;  Surgeon: Jolaine Artist, MD;  Location: [facility];  Service:  Cardiovascular;  Laterality: N/A;  . CARDIAC SURGERY  2010   , 2 blockages  . CHOLECYSTECTOMY    . CORONARY ARTERY BYPASS GRAFT    . HEEL SPUR EXCISION  2000   left heel  . SPINE SURGERY  2003   Herniated diskectomy  . WRIST SURGERY     right wrist   Social History   Occupational History  . Retired    Social History Main Topics  . Smoking status: Current Some Day Smoker    Packs/day: 1.00    Years: 52.00    Types: Cigarettes  . Smokeless tobacco: Never Used     Comment: has not set quit date  . Alcohol use No  . Drug use: No  . Sexual activity: Not on file

## 2017-01-25 ENCOUNTER — Other Ambulatory Visit: Payer: Self-pay | Admitting: Physician Assistant

## 2017-01-27 NOTE — Telephone Encounter (Signed)
Refill Request.  

## 2017-02-06 ENCOUNTER — Other Ambulatory Visit: Payer: Self-pay | Admitting: Cardiology

## 2017-03-07 ENCOUNTER — Ambulatory Visit (INDEPENDENT_AMBULATORY_CARE_PROVIDER_SITE_OTHER): Payer: Medicare Other | Admitting: Physician Assistant

## 2017-03-07 ENCOUNTER — Ambulatory Visit (INDEPENDENT_AMBULATORY_CARE_PROVIDER_SITE_OTHER): Payer: Medicare Other

## 2017-03-07 DIAGNOSIS — M25551 Pain in right hip: Secondary | ICD-10-CM

## 2017-03-07 NOTE — Progress Notes (Signed)
Mrs. Ebers returns today follow-up of her right hip pain. She underwent a right hip intra-articular injection by Dr. Ernestina Patches on 01/01/2017. She states the injection gave her good relief for about 2 months. Now her pain is returning. Says bad as it was prior. She is wanting to proceed with a right total hip arthroplasty in the near future. Dr. Ninfa Linden and myself of both discussed and gone over with her in the past the benefits risks of surgery, postoperative protocol and showed her the hip components. She does have a significant cardiac history and will require clearance from cardiology prior to scheduling.  Physical exam right hip she has pain with internal/external rotation and limited range of motion compared to the left hip which he has no pain with internal/external rotation. Leg lengths she's slightly shorter on the right compared to left.  Radiographs AP pelvis lateral view of the right hip: No acute fracture. Severely arthritic hip joint with slight flattening of the femoral head. Femoral head is well located.  Plan: She will obtain clearance from her cardiologist and then she will give Kandice Hams call to set up surgery. Sutures card was given to the patient today. We'll then see her at 2 weeks postop. Questions are encouraged and answered length today.

## 2017-03-09 ENCOUNTER — Other Ambulatory Visit (HOSPITAL_COMMUNITY): Payer: Self-pay | Admitting: Internal Medicine

## 2017-04-01 ENCOUNTER — Other Ambulatory Visit: Payer: Self-pay | Admitting: Cardiology

## 2017-04-08 ENCOUNTER — Telehealth (HOSPITAL_COMMUNITY): Payer: Self-pay | Admitting: *Deleted

## 2017-04-08 ENCOUNTER — Encounter: Payer: Self-pay | Admitting: Nurse Practitioner

## 2017-04-08 ENCOUNTER — Ambulatory Visit (INDEPENDENT_AMBULATORY_CARE_PROVIDER_SITE_OTHER): Payer: Medicare Other | Admitting: Nurse Practitioner

## 2017-04-08 VITALS — BP 110/64 | HR 71 | Ht 59.0 in | Wt 159.4 lb

## 2017-04-08 DIAGNOSIS — Z01810 Encounter for preprocedural cardiovascular examination: Secondary | ICD-10-CM | POA: Diagnosis not present

## 2017-04-08 DIAGNOSIS — Z952 Presence of prosthetic heart valve: Secondary | ICD-10-CM

## 2017-04-08 DIAGNOSIS — R0602 Shortness of breath: Secondary | ICD-10-CM

## 2017-04-08 DIAGNOSIS — Z720 Tobacco use: Secondary | ICD-10-CM | POA: Diagnosis not present

## 2017-04-08 DIAGNOSIS — I25810 Atherosclerosis of coronary artery bypass graft(s) without angina pectoris: Secondary | ICD-10-CM

## 2017-04-08 DIAGNOSIS — I10 Essential (primary) hypertension: Secondary | ICD-10-CM | POA: Diagnosis not present

## 2017-04-08 NOTE — Progress Notes (Addendum)
CARDIOLOGY OFFICE NOTE  Date:  04/08/2017    [name] Date of Birth: 1940-11-17 Medical Record #[id number]  PCP:  Donald Prose, MD  Cardiologist:  [facility]  Chief Complaint  Patient presents with  . Pre-op Exam    Seen for Dr. [name]    History of Present Illness: [name] is a 76 y.o. female who presents today for a pre op visit. Seen for Dr. [name].   She has a complex medical history that includes coronary artery disease status post bypass x2- grafts to diagonal and obtuse marginal 12/29/08, aortic valve replacement with bioprosthetic #21 valve, hypertension, & AVM with prior GI bleed (no futher post AVR).   She had a Holter monitor placed in May 2015 which demonstrated no significant bradycardia, rare PACs. Her last echocardiogram obtained in October of 2017 showed EF of 60 to 42%, grade 1 diastolic dysfunction, normal functioning AVR, PAP of 38.   She had an normal Myoview on 03/30/2015 which showed low risk study, EF greater than 65%, no ischemia or infarction. She has been seen in the past by pulmonology, pulmonary function tests showed a normal lung capacity, no airflow obstruction, but markedly decreased diffusion capacity in the face of normal hemoglobin. Per pulmonologist, there was no clear evidence of lung disease, however very concerned about the possibility of pulmonary arterial hypertension.   She underwent right heart cath by Dr. Haroldine Laws on 08/11/2015 which showed PA pressure 53/14, with mean of 29, cardiac output of 3.2, cardiac index 1.9. She had very good filling pressure, however she had mild PAH and moderately elevated PVR. Given relatively normal mixed venous saturation, suspect cardiac output may be falsely low. After discussing with pulmonology, it was decided not to start selective pulmonary dilator at that time. However if the symptom persist, would consider cardiopulmonary exercise to further evaluate.  Her other issues include HLD, HTN,  multiple somatic complaints, fatigue (tried on Bystolic instead of metoprolol in the past), ongoing tobacco abuse, anemia and anxiety. Other issues as noted below.   Last seen in October of 2017 by Dr. [name]. Continued to be short of breath and continued to smoke.   Comes in today. Here with her daughter.  Wanting clearance for right total hip surgery. Quite short of breath with coming into the exam room - almost grunting at times. Moving very slow. Using a cane. Says she has stopped smoking - but not able to tell me when but says its because her breathing has gotten worse.  She says her hip is giving out on her. Still with considerable shortness of breath - mostly with activity. She is pretty much house bound because she cannot walk without being short of breath. Little dizzy at times. No syncope. When asked about chest pain she says "not really" but then feels like she has had some chest pain - very vague on this topic.  Limited by breathing and her hip pain primarily. Has not been back to see pulmonary.    Past Medical History:  Diagnosis Date  . Abdominal pain, unspecified site   . Anemia    Iron deficiency anemia  . Anxiety   . Aortic stenosis   . CAD (coronary artery disease)    psot bypass x2 to diagonal and obtuse marginal. SVG graft-01/08/2009  . GERD (gastroesophageal reflux disease)   . History of cardiovascular stress test    Myoview 7/16:  Low risk, no ischemia or scar, EF 72%  . History of echocardiogram  Echo 7/16:  EF 55-60%, no RWMA, Gr 2 DD, AVR ok (mean 20 mmHg), PASP 40 mmHg  . History of GI bleed    With AVM  . Hypercholesterolemia   . Hyperlipidemia   . Hypertension   . Iron deficiency anemia   . Myocardial infarction ([place]) 2010  . Peripheral vascular disease ([place])   . Shoulder impingement syndrome    Right shoulder    Past Surgical History:  Procedure Laterality Date  . ABDOMINAL HYSTERECTOMY    . AORTIC VALVE REPLACEMENT     [place] #21  . CARDIAC  CATHETERIZATION N/A 08/11/2015   Procedure: Right Heart Cath;  Surgeon: Jolaine Artist, MD;  Location: [facility];  Service: Cardiovascular;  Laterality: N/A;  . CARDIAC SURGERY  2010   , 2 blockages  . CHOLECYSTECTOMY    . CORONARY ARTERY BYPASS GRAFT    . HEEL SPUR EXCISION  2000   left heel  . SPINE SURGERY  2003   Herniated diskectomy  . WRIST SURGERY     right wrist     Medications: No outpatient prescriptions have been marked as taking for the 04/08/17 encounter (Office Visit) with Burtis Junes, NP.     Allergies: Allergies  Allergen Reactions  . Codeine Other (See Comments)    Feeling intoxicated    Social History: The patient  reports that she quit smoking about 2 months ago. Her smoking use included Cigarettes. She has a 52.00 pack-year smoking history. She has never used smokeless tobacco. She reports that she does not drink alcohol or use drugs.   Family History: The patient's family history includes Diabetes in her brother; Hypertension in her brother, daughter, father, mother, and sister; Kidney disease in her mother.   Review of Systems: Please see the history of present illness.   Otherwise, the review of systems is positive for none.   All other systems are reviewed and negative.   Physical Exam: VS:  BP 110/64 (BP Location: Left Arm, Patient Position: Sitting, Cuff Size: Normal)   Pulse 71   Ht 4\' 11"  (1.499 m)   Wt 159 lb 6.4 oz (72.3 kg)   LMP  (LMP Unknown)   SpO2 95% Comment: walking in room  BMI 32.19 kg/m  .  BMI Body mass index is 32.19 kg/m.  Wt Readings from Last 3 Encounters:  04/08/17 159 lb 6.4 oz (72.3 kg)  11/13/16 163 lb (73.9 kg)  09/25/16 163 lb (73.9 kg)    General: Elderly. Kyphotic. Short of breath with walking into the exam room from lobby. Smells of tobacco.  She is alert and in no acute distress. She has lost a few pounds.   HEENT: Normal.  Neck: Supple, no JVD, carotid bruits, or masses noted.  Cardiac:  Regular rate and rhythm. Soft outflow murmur. No edema.  Respiratory:  Lungs with very decreased breath sounds and with mild increase in work of breathing.  GI: Soft and nontender.  MS: No deformity or atrophy. Gait and ROM intact. But using a kane.  Skin: Warm and dry. Color is normal.  Neuro:  Strength and sensation are intact and no gross focal deficits noted.  Psych: Alert, appropriate and with normal affect.   LABORATORY DATA:  EKG:  EKG is ordered today. This demonstrates NSR with nonspecific T wave changes.  Lab Results  Component Value Date   WBC 5.9 08/07/2015   HGB 13.5 08/07/2015   HCT 39.3 08/07/2015   PLT 207 08/07/2015  GLUCOSE 89 08/07/2015   CHOL 124 01/31/2014   TRIG 74.0 01/31/2014   HDL 36.10 (L) 01/31/2014   LDLCALC 73 01/31/2014   ALT 21 01/11/2015   AST 20 01/31/2014   NA 143 08/07/2015   K 4.2 08/07/2015   CL 106 08/07/2015   CREATININE 0.64 08/07/2015   BUN 13 08/07/2015   CO2 25 08/07/2015   INR 1.11 08/07/2015   HGBA1C  12/27/2008    5.6 (NOTE) The ADA recommends the following therapeutic goal for glycemic control related to Hgb A1c measurement: Goal of therapy: <6.5 Hgb A1c  Reference: American Diabetes Association: Clinical Practice Recommendations 2010, Diabetes Care, 2010, 33: (Suppl  1).     BNP (last 3 results) No results for input(s): BNP in the last 8760 hours.  ProBNP (last 3 results) No results for input(s): PROBNP in the last 8760 hours.   Other Studies Reviewed Today:  Echo Study Conclusions October 2017  - Left ventricle: The cavity size was normal. There was moderate   concentric hypertrophy. Systolic function was normal. The   estimated ejection fraction was in the range of 60% to 65%. Wall   motion was normal; there were no regional wall motion   abnormalities. Doppler parameters are consistent with abnormal   left ventricular relaxation (grade 1 diastolic dysfunction). - Aortic valve: A prosthesis was present and  functioning normally.   The prosthesis had a normal range of motion. The sewing ring   appeared normal, had no rocking motion, and showed no evidence of   dehiscence. Peak velocity (S): 289 cm/s. (within normal limits   post valve replacement) Mean gradient (S): 19 mm Hg. - Mitral valve: Calcified annulus. - Tricuspid valve: There was trivial regurgitation. - Pulmonary arteries: Systolic pressure was mildly increased. PA   peak pressure: 38 mm Hg (S).  Impressions:  - Similar aortic valve gradients when compared to prior study.   Right heart cath 08/11/15 (dx SOB) Findings:  RA = 7 RV = 49/0/10 PA = 53/14 (29) PCW = 9 Fick cardiac output/index = 3.2/1.9 PVR = 6.4 WU Ao sat = 96% PA sat = 56%, 57%  Assessment: 1. Normal R and L sided filling pressures 2. Mild PAH with moderately elevated PVR 3. Low cardiac output  Plan/Discussion:  Filling pressures look great. She does have mild PAH moderately elevated PVR. Given relatively normal mixed venous saturations suspect Fick cardiac output may be falsely low and this will raise PVR. Would not start selective pulmonary dilators at this time. If symptoms persist could consider cardiopulmonary exercise testing to further evaluate. Will d/w Dr. Lake Bells.  Bensimhon, Daniel,MD  Myoview Study Highlights 03/2015    Nuclear stress EF: 72%.  There was no ST segment deviation noted during stress.  The study is normal.  This is a low risk study.  The left ventricular ejection fraction is hyperdynamic (>65%).   Low risk study Normal resting and stress perfusion with no ischemia or infarction EF 72%       ASSESSMENT AND PLAN:   1. Pre op clearance - felt to be at increased - at least moderate risk - given her other health issues - more shortness of breath noted - has not seen pulmonary in some time - also endorses chest pain. She is quite sedentary. Will arrange for [facility] and refer back to pulmonary to get  Dr. Anastasia Pall input. Further disposition to follow. Labs updated today.   2. CAD with prior CABG - updating Myoview.   3.  Progressive shortness of breath - referring back to pulmonary - has stopped smoking.   4. Prior AVR - most recent echo noted. Continue SBE.   5. HTN - BP ok on current regimen.   6. HLD - no recent lab noted. On statin therapy.   7. Prior mention of dilated aortic root - not noted on last echo - would follow.     Current medicines are reviewed with the patient today.  The patient does not have concerns regarding medicines other than what has been noted above.  The following changes have been made:  See above.  Labs/ tests ordered today include:    Orders Placed This Encounter  Procedures  . Basic metabolic panel  . CBC  . Hepatic function panel  . Lipid panel  . TSH  . Pro b natriuretic peptide (BNP)  . Ambulatory referral to Pulmonology  . Myocardial Perfusion Imaging  . EKG 12-Lead     Disposition:   FU with Dr. [name] - has recall in October - she is wanting to be seen back sooner.   Patient is agreeable to this plan and will call if any problems develop in the interim.   SignedTruitt Merle, NP  04/08/2017 8:29 AM  [facility] [address] [place], [place]  [address] Phone: [phone] Fax: [phone]       Addendum: Her labs are noted for marked anemia - hemoglobin down to 8.4. I suspect this is causing worsening shortness of breath. She has been advised to see her PCP. She is somewhat resistant to this plan. Would proceed with Myoview. She has seen pulmonary - felt to be at moderate risk from their standpoint as well.   Burtis Junes, RN, [facility] [address] [place], [place]  [address] [phone]

## 2017-04-08 NOTE — Patient Instructions (Addendum)
We will be checking the following labs today - BMET, CBC, HPF, lipids, TSH and BNP   Medication Instructions:    Continue with your current medicines.     Testing/Procedures To Be Arranged:  Lexiscan Myoview  Follow-Up:   Referral back to pulmonary - has seen Dr. Lake Bells  See Dr. [name] as planned.     Other Special Instructions:   N/A    If you need a refill on your cardiac medications before your next appointment, please call your pharmacy.   Call the [place] office at [phone] if you have any questions, problems or concerns.

## 2017-04-08 NOTE — Telephone Encounter (Signed)
Patient given detailed instructions per Myocardial Perfusion Study Information Sheet for the test on 04/10/17. Patient notified to arrive 15 minutes early and that it is imperative to arrive on time for appointment to keep from having the test rescheduled.  If you need to cancel or reschedule your appointment, please call the office within 24 hours of your appointment. . Patient verbalized understanding.[name] [name]    

## 2017-04-09 ENCOUNTER — Ambulatory Visit (INDEPENDENT_AMBULATORY_CARE_PROVIDER_SITE_OTHER)
Admission: RE | Admit: 2017-04-09 | Discharge: 2017-04-09 | Disposition: A | Discharge status: Home / Self Care | Payer: Medicare Other | Source: Ambulatory Visit | Attending: Pulmonary Disease | Admitting: Pulmonary Disease

## 2017-04-09 ENCOUNTER — Ambulatory Visit (INDEPENDENT_AMBULATORY_CARE_PROVIDER_SITE_OTHER): Payer: Medicare Other | Admitting: Pulmonary Disease

## 2017-04-09 ENCOUNTER — Telehealth: Payer: Self-pay | Admitting: Cardiology

## 2017-04-09 ENCOUNTER — Encounter: Payer: Self-pay | Admitting: Pulmonary Disease

## 2017-04-09 VITALS — BP 116/72 | HR 74 | Ht 59.0 in | Wt 158.0 lb

## 2017-04-09 DIAGNOSIS — Z720 Tobacco use: Secondary | ICD-10-CM

## 2017-04-09 DIAGNOSIS — I2721 Secondary pulmonary arterial hypertension: Secondary | ICD-10-CM

## 2017-04-09 LAB — BASIC METABOLIC PANEL
BUN/Creatinine Ratio: 21 (ref 12–28)
BUN: 14 mg/dL (ref 8–27)
CO2: 16 mmol/L — ABNORMAL LOW (ref 20–29)
Calcium: 9.4 mg/dL (ref 8.7–10.3)
Chloride: 104 mmol/L (ref 96–106)
Creatinine, Ser: 0.67 mg/dL (ref 0.57–1.00)
GFR calc Af Amer: 99 mL/min/{1.73_m2} (ref >59)
GFR calc non Af Amer: 86 mL/min/{1.73_m2} (ref >59)
Glucose: 111 mg/dL — ABNORMAL HIGH (ref 65–99)
Potassium: 4 mmol/L (ref 3.5–5.2)
Sodium: 144 mmol/L (ref 134–144)

## 2017-04-09 LAB — CBC
Hematocrit: 28.6 % — ABNORMAL LOW (ref 34.0–46.6)
Hemoglobin: 8.4 g/dL — ABNORMAL LOW (ref 11.1–15.9)
MCH: 23.1 pg — ABNORMAL LOW (ref 26.6–33.0)
MCHC: 29.4 g/dL — ABNORMAL LOW (ref 31.5–35.7)
MCV: 79 fL (ref 79–97)
Platelets: 265 10*3/uL (ref 150–379)
RBC: 3.63 x10E6/uL — ABNORMAL LOW (ref 3.77–5.28)
RDW: 14.4 % (ref 12.3–15.4)
WBC: 6.8 10*3/uL (ref 3.4–10.8)

## 2017-04-09 LAB — LIPID PANEL
Chol/HDL Ratio: 3.8 ratio (ref 0.0–4.4)
Cholesterol, Total: 114 mg/dL (ref 100–199)
HDL: 30 mg/dL — ABNORMAL LOW (ref >39)
LDL Calculated: 62 mg/dL (ref 0–99)
Triglycerides: 108 mg/dL (ref 0–149)
VLDL Cholesterol Cal: 22 mg/dL (ref 5–40)

## 2017-04-09 LAB — HEPATIC FUNCTION PANEL
ALT: 19 IU/L (ref 0–32)
AST: 23 IU/L (ref 0–40)
Albumin: 3.9 g/dL (ref 3.5–4.8)
Alkaline Phosphatase: 105 IU/L (ref 39–117)
Bilirubin Total: <0.2 mg/dL (ref 0.0–1.2)
Bilirubin, Direct: 0.06 mg/dL (ref 0.00–0.40)
Total Protein: 7.2 g/dL (ref 6.0–8.5)

## 2017-04-09 LAB — PRO B NATRIURETIC PEPTIDE: NT-Pro BNP: 258 pg/mL (ref 0–738)

## 2017-04-09 LAB — TSH: TSH: 1.39 u[IU]/mL (ref 0.450–4.500)

## 2017-04-09 MED ORDER — TIOTROPIUM BROMIDE MONOHYDRATE 18 MCG IN CAPS: 18.0000 ug | ORAL_CAPSULE | Freq: Every morning | RESPIRATORY_TRACT | 12 refills | Status: DC

## 2017-04-09 NOTE — Progress Notes (Signed)
[facility]   Chief Complaint  Patient presents with  . Surgical Clearance    Total right hip replacement. Has had issues with SOB and wheezing. Denies any issues with coughing. CP every once in a while.      Primary Pulmonologist: Dr. Lake Bells  Current Outpatient Prescriptions on File Prior to Visit  Medication Sig  . amLODipine (NORVASC) 10 MG tablet TAKE 1 TABLET BY MOUTH EVERY DAY  . aspirin 81 MG tablet Take 81 mg by mouth daily.  Marland Kitchen atorvastatin (LIPITOR) 80 MG tablet TAKE 1 TABLET BY MOUTH EVERY DAY  . BYSTOLIC 5 MG tablet TAKE 1 TABLET BY MOUTH EVERY DAY  . Calcium Carbonate-Vitamin D (CALCIUM 600+D) 600-400 MG-UNIT per tablet Take 1 tablet by mouth daily.  . ferrous sulfate 325 (65 FE) MG tablet Take 325 mg by mouth daily with breakfast.  . furosemide (LASIX) 20 MG tablet TAKE 1 TABLET BY MOUTH EVERY DAY AS NEEDED  . glycopyrrolate (ROBINUL) 2 MG tablet Take 2 mg by mouth 2 (two) times daily.  Marland Kitchen lisinopril-hydrochlorothiazide (PRINZIDE,ZESTORETIC) 20-25 MG tablet Take 1/2 tablet (10-12.5mg ) by mouth two times a day  . LORazepam (ATIVAN) 0.5 MG tablet Take 0.5 mg by mouth at bedtime as needed for anxiety or sleep.   . meloxicam (MOBIC) 7.5 MG tablet Take 1 tablet (7.5 mg total) by mouth 2 (two) times daily as needed for pain. for pain  . Multiple Vitamin (MULTIVITAMIN) tablet Take 1 tablet by mouth daily.  Marland Kitchen omeprazole (PRILOSEC) 40 MG capsule Take 40 mg by mouth daily.  . potassium chloride (MICRO-K) 10 MEQ CR capsule TAKE 2 CAPSULES BY MOUTH TWICE A DAY  . sertraline (ZOLOFT) 100 MG tablet TAKE 1 & 1/2 TABLET BY MOUTH ONCE A DAY  . sucralfate (CARAFATE) 1 G tablet TAKE 1 TABLET BY MOUTH 4 TIMES DAILY - WITH MEALS AND AT BEDTIME.  . traMADol (ULTRAM) 50 MG tablet TAKE 1 TABLET BY MOUTH EVERY 6 HOURS AS NEEDED FOR MODERATE PAIN   No current facility-administered medications on file prior to visit.      Studies: 08/11/15  [facility]  RA = 7 RV = 49/0/10 PA = 53/14  (29) PCW = 9 Fick cardiac output/index = 3.2/1.9 PVR = 6.4 WU Ao sat = 96% PA sat = 56%, 57%  1. Normal R and L sided filling pressures 2. Mild PAH with moderately elevated PVR 3. Low cardiac output   Myoview 03/2015  Myoview Study Highlights 03/2015    Nuclear stress EF: 72%.  There was no ST segment deviation noted during stress.  The study is normal.  This is a low risk study.  The left ventricular ejection fraction is hyperdynamic (>65%).  Low risk study Normal resting and stress perfusion with no ischemia or infarction EF 72%       Past Medical Hx:  has a past medical history of Abdominal pain, unspecified site; Anemia; Anxiety; Aortic stenosis; CAD (coronary artery disease); GERD (gastroesophageal reflux disease); History of cardiovascular stress test; History of echocardiogram; History of GI bleed; Hypercholesterolemia; Hyperlipidemia; Hypertension; Iron deficiency anemia; Myocardial infarction ([place]) (2010); Peripheral vascular disease ([place]); and Shoulder impingement syndrome.   Past Surgical hx, Allergies, Family hx, Social hx all reviewed.  Vital Signs BP 116/72 (BP Location: Left Arm, Patient Position: Sitting, Cuff Size: Normal)   Pulse 74   Ht 4\' 11"  (1.499 m)   Wt 158 lb (71.7 kg)   LMP  (LMP Unknown)   SpO2 96%   BMI 31.91 kg/m  History of Present Illness [name] is a 76 y.o. female, current smoker  with a history of CAD s/p CABGx2, AVR with bioprosthetic valve, PVD, AVM with GIB, mild PAH who presented to the pulmonary office for pre-operative surgical clearance for hip replacement per Dr. Ninfa Linden.    She reports she began smoking at age 23, continues to smoke.  At her heaviest she smoked 0.5 ppd.  Currently, she continues to smoke 3-4 cigarettes per day.  She worked at [employer] and grew up in [place]. [place].    She is followed by Dr. Lake Bells (last seen in 2016) for dyspnea & PAH that was initially noted on ECHO.  Prior PFT shows normal  total lung capacity, no airflow obstruction & decreased diffusion capacity with normal hemoglobin.  Follow up [place] demonstrated mild PAH with moderately elevated PVR > not recommended for pulmonary vasodilators.  She is followed by Dr. Haroldine Laws and was recommended for a VQ scan but do not see record that she completed testing.     The patient reports she has been SOB in the last few weeks to months b/c she tires walking.  She feels this is mostly realted to her hip pain.  It has significantly reduced her mobility / endurance.  She denies cough, sputum production, fevers, chills, chest pain, pain with inspiration. Reports some wheezing most with exertion and occasionally her feet will swell. She ambulated very slowly coming into the office.  At rest she is not short of breath in appearance.     Physical Exam  General - well developed adult F in no acute distress ENT - No sinus tenderness, no oral exudate, no LAN Cardiac - s1s2 regular, soft murmur Chest - even/non-labored, lungs bilaterally with bibasilar faint crackles that clear with cough. No wheeze/rales Back - No focal tenderness Abd - Soft, non-tender Ext - No edema Neuro - Normal strength Skin - No rashes Psych - normal mood, and behavior   Assessment/Plan  Discussion: 76 y/o F, current active smoker with mild PAH who presents for pre-operative pulmonary clearance.  She has not been since 2016.  We reviewed her overall risk for surgery, prior medical issues and how this can impact her outcome from surgery.  She is at least moderate risk for anesthesia.    Pre-Operative Pulmonary Clearance  Plan: Assess CXR today to ensure no new active disease Repeat PFT's to assess for changes given ongoing smoking prior to surgery  Smoking cessation counseling completed > we reviewed her risk for poor bone healing, wound healing and difficulty with anesthesia Renewal of Spiriva given as she has been taking the medication.  This was initially  prescribed by Dr. Haroldine Laws.  Pending PFT's review, she may not need it given not prior airflow limitation.   Reviewed deep breathing techniques to mimic incentive spirometry / open lower airways prior to surgery Encouraged adequate rest, ongoing exercise as able and nutritious foods pre-operatively   Patient Instructions  1.  We will review your chest xray today and call you with the results 2.  Please stop smoking!  You need to quit for at least one month before your surgery.  3.  Take 10 long slow deep breaths 3 times per day in the two weeks leading up to your surgery  4.  Continue your Spiriva as ordered, we will send you a refill  5.  We will repeat your PFT's (pulmonary function testing) and call you with the results 6. Call if new or worsening symptoms  Noe Gens, NP-C [facility]  [phone] 04/09/2017, 12:54 PM

## 2017-04-09 NOTE — Patient Instructions (Addendum)
1.  We will review your chest xray today and call you with the results 2.  Please stop smoking!  You need to quit for at least one month before your surgery.  3.  Take 10 long slow deep breaths 3 times per day in the two weeks leading up to your surgery  4.  Continue your Spiriva as ordered, we will send you a refill  5.  We will repeat your PFT's (pulmonary function testing) and call you with the results 6. Call if new or worsening symptoms

## 2017-04-09 NOTE — Telephone Encounter (Signed)
[name] is returning a call about her lab results . Please call

## 2017-04-09 NOTE — Telephone Encounter (Signed)
Follow up ° ° ° ° ° °Returning a call to the nurse °

## 2017-04-09 NOTE — Progress Notes (Signed)
reviewed

## 2017-04-09 NOTE — Telephone Encounter (Signed)
New Message ° ° pt verbalized that she is returning call for rn °

## 2017-04-09 NOTE — Telephone Encounter (Signed)
Patient is aware of lab results and verbalized understanding.

## 2017-04-09 NOTE — Telephone Encounter (Signed)
Pt's daughter per [facility]) called in to see what instruction's were given to pt earlier.  Went over in detail and addressed issues that were discussed in result note.

## 2017-04-10 ENCOUNTER — Encounter (HOSPITAL_COMMUNITY): Payer: Self-pay | Admitting: Emergency Medicine

## 2017-04-10 ENCOUNTER — Encounter (HOSPITAL_COMMUNITY): Payer: Medicare Other

## 2017-04-10 ENCOUNTER — Observation Stay (HOSPITAL_COMMUNITY)
Admission: EM | Admit: 2017-04-10 | Discharge: 2017-04-12 | Disposition: A | Discharge status: Home / Self Care | Payer: Medicare Other | Attending: Nephrology | Admitting: Nephrology

## 2017-04-10 DIAGNOSIS — K31819 Angiodysplasia of stomach and duodenum without bleeding: Secondary | ICD-10-CM | POA: Diagnosis not present

## 2017-04-10 DIAGNOSIS — K319 Disease of stomach and duodenum, unspecified: Secondary | ICD-10-CM | POA: Insufficient documentation

## 2017-04-10 DIAGNOSIS — I350 Nonrheumatic aortic (valve) stenosis: Secondary | ICD-10-CM | POA: Diagnosis not present

## 2017-04-10 DIAGNOSIS — Z953 Presence of xenogenic heart valve: Secondary | ICD-10-CM | POA: Diagnosis not present

## 2017-04-10 DIAGNOSIS — K259 Gastric ulcer, unspecified as acute or chronic, without hemorrhage or perforation: Secondary | ICD-10-CM | POA: Insufficient documentation

## 2017-04-10 DIAGNOSIS — I252 Old myocardial infarction: Secondary | ICD-10-CM | POA: Diagnosis not present

## 2017-04-10 DIAGNOSIS — F419 Anxiety disorder, unspecified: Secondary | ICD-10-CM | POA: Diagnosis not present

## 2017-04-10 DIAGNOSIS — E785 Hyperlipidemia, unspecified: Secondary | ICD-10-CM | POA: Diagnosis not present

## 2017-04-10 DIAGNOSIS — Z720 Tobacco use: Secondary | ICD-10-CM | POA: Diagnosis present

## 2017-04-10 DIAGNOSIS — D649 Anemia, unspecified: Secondary | ICD-10-CM | POA: Diagnosis present

## 2017-04-10 DIAGNOSIS — I25810 Atherosclerosis of coronary artery bypass graft(s) without angina pectoris: Secondary | ICD-10-CM | POA: Diagnosis present

## 2017-04-10 DIAGNOSIS — I2510 Atherosclerotic heart disease of native coronary artery without angina pectoris: Secondary | ICD-10-CM | POA: Diagnosis not present

## 2017-04-10 DIAGNOSIS — Z8719 Personal history of other diseases of the digestive system: Secondary | ICD-10-CM

## 2017-04-10 DIAGNOSIS — Z79899 Other long term (current) drug therapy: Secondary | ICD-10-CM | POA: Insufficient documentation

## 2017-04-10 DIAGNOSIS — I10 Essential (primary) hypertension: Secondary | ICD-10-CM | POA: Diagnosis not present

## 2017-04-10 DIAGNOSIS — R195 Other fecal abnormalities: Secondary | ICD-10-CM | POA: Insufficient documentation

## 2017-04-10 DIAGNOSIS — D508 Other iron deficiency anemias: Secondary | ICD-10-CM | POA: Diagnosis not present

## 2017-04-10 DIAGNOSIS — I739 Peripheral vascular disease, unspecified: Secondary | ICD-10-CM | POA: Diagnosis not present

## 2017-04-10 DIAGNOSIS — Z951 Presence of aortocoronary bypass graft: Secondary | ICD-10-CM | POA: Diagnosis not present

## 2017-04-10 DIAGNOSIS — F17210 Nicotine dependence, cigarettes, uncomplicated: Secondary | ICD-10-CM | POA: Insufficient documentation

## 2017-04-10 DIAGNOSIS — D509 Iron deficiency anemia, unspecified: Principal | ICD-10-CM | POA: Insufficient documentation

## 2017-04-10 DIAGNOSIS — R531 Weakness: Secondary | ICD-10-CM

## 2017-04-10 DIAGNOSIS — Z952 Presence of prosthetic heart valve: Secondary | ICD-10-CM

## 2017-04-10 DIAGNOSIS — F329 Major depressive disorder, single episode, unspecified: Secondary | ICD-10-CM | POA: Insufficient documentation

## 2017-04-10 DIAGNOSIS — K5520 Angiodysplasia of colon without hemorrhage: Secondary | ICD-10-CM | POA: Diagnosis not present

## 2017-04-10 DIAGNOSIS — K6289 Other specified diseases of anus and rectum: Secondary | ICD-10-CM | POA: Diagnosis not present

## 2017-04-10 DIAGNOSIS — K219 Gastro-esophageal reflux disease without esophagitis: Secondary | ICD-10-CM | POA: Insufficient documentation

## 2017-04-10 DIAGNOSIS — Z7982 Long term (current) use of aspirin: Secondary | ICD-10-CM | POA: Insufficient documentation

## 2017-04-10 LAB — RETICULOCYTES
RBC.: 3.54 MIL/uL — AB (ref 3.87–5.11)
RETIC CT PCT: 3.9 % — AB (ref 0.4–3.1)
Retic Count, Absolute: 138.1 10*3/uL (ref 19.0–186.0)

## 2017-04-10 LAB — COMPREHENSIVE METABOLIC PANEL
ALBUMIN: 3.5 g/dL (ref 3.5–5.0)
ALT: 22 U/L (ref 14–54)
ANION GAP: 10 (ref 5–15)
AST: 26 U/L (ref 15–41)
Alkaline Phosphatase: 87 U/L (ref 38–126)
BUN: 14 mg/dL (ref 6–20)
CO2: 24 mmol/L (ref 22–32)
Calcium: 9.4 mg/dL (ref 8.9–10.3)
Chloride: 107 mmol/L (ref 101–111)
Creatinine, Ser: 0.71 mg/dL (ref 0.44–1.00)
GFR calc Af Amer: >60 mL/min (ref >60)
GFR calc non Af Amer: >60 mL/min (ref >60)
GLUCOSE: 108 mg/dL — AB (ref 65–99)
POTASSIUM: 3.8 mmol/L (ref 3.5–5.1)
SODIUM: 141 mmol/L (ref 135–145)
Total Bilirubin: 0.3 mg/dL (ref 0.3–1.2)
Total Protein: 7.3 g/dL (ref 6.5–8.1)

## 2017-04-10 LAB — URINALYSIS, ROUTINE W REFLEX MICROSCOPIC
BILIRUBIN URINE: NEGATIVE
Glucose, UA: NEGATIVE mg/dL
HGB URINE DIPSTICK: NEGATIVE
KETONES UR: NEGATIVE mg/dL
NITRITE: POSITIVE — AB
PH: 5 (ref 5.0–8.0)
Protein, ur: NEGATIVE mg/dL
Specific Gravity, Urine: 1.016 (ref 1.005–1.030)

## 2017-04-10 LAB — I-STAT TROPONIN, ED: Troponin i, poc: 0 ng/mL (ref 0.00–0.08)

## 2017-04-10 LAB — POC OCCULT BLOOD, ED: FECAL OCCULT BLD: POSITIVE — AB

## 2017-04-10 LAB — IRON AND TIBC
Iron: 9 ug/dL — ABNORMAL LOW (ref 28–170)
SATURATION RATIOS: 2 % — AB (ref 10.4–31.8)
TIBC: 459 ug/dL — ABNORMAL HIGH (ref 250–450)
UIBC: 450 ug/dL

## 2017-04-10 LAB — PROTIME-INR
INR: 1.19
Prothrombin Time: 15.2 seconds (ref 11.4–15.2)

## 2017-04-10 LAB — TROPONIN I

## 2017-04-10 LAB — CBC
HEMATOCRIT: 28.2 % — AB (ref 36.0–46.0)
HEMOGLOBIN: 8.2 g/dL — AB (ref 12.0–15.0)
MCH: 23.2 pg — AB (ref 26.0–34.0)
MCHC: 29.1 g/dL — AB (ref 30.0–36.0)
MCV: 79.7 fL (ref 78.0–100.0)
Platelets: 242 10*3/uL (ref 150–400)
RBC: 3.54 MIL/uL — AB (ref 3.87–5.11)
RDW: 15.2 % (ref 11.5–15.5)
WBC: 6.4 10*3/uL (ref 4.0–10.5)

## 2017-04-10 LAB — FOLATE: FOLATE: 15.5 ng/mL (ref >5.9)

## 2017-04-10 LAB — PREPARE RBC (CROSSMATCH)

## 2017-04-10 LAB — VITAMIN B12: Vitamin B-12: 559 pg/mL (ref 180–914)

## 2017-04-10 LAB — FERRITIN: Ferritin: 23 ng/mL (ref 11–307)

## 2017-04-10 MED ORDER — SODIUM CHLORIDE 0.9 % IV BOLUS (SEPSIS)
500.0000 mL | Freq: Once | INTRAVENOUS | Status: AC
  Administered 2017-04-10: 500 mL via INTRAVENOUS

## 2017-04-10 MED ORDER — LORAZEPAM 0.5 MG PO TABS: 0.5000 mg | ORAL_TABLET | Freq: Every evening | ORAL | Status: DC | PRN

## 2017-04-10 MED ORDER — SODIUM CHLORIDE 0.9% FLUSH: 3.0000 mL | INTRAVENOUS | Status: DC | PRN

## 2017-04-10 MED ORDER — FERROUS SULFATE 325 (65 FE) MG PO TABS: 325.0000 mg | ORAL_TABLET | Freq: Every day | ORAL | Status: DC

## 2017-04-10 MED ORDER — SODIUM CHLORIDE 0.9% FLUSH
3.0000 mL | Freq: Two times a day (BID) | INTRAVENOUS | Status: DC
  Administered 2017-04-11: 3 mL via INTRAVENOUS

## 2017-04-10 MED ORDER — ONDANSETRON HCL 4 MG/2ML IJ SOLN: 4.0000 mg | Freq: Four times a day (QID) | INTRAMUSCULAR | Status: DC | PRN

## 2017-04-10 MED ORDER — SODIUM CHLORIDE 0.9 % IV SOLN
80.0000 mg | Freq: Once | INTRAVENOUS | Status: AC
  Administered 2017-04-10: 12:00:00 80 mg via INTRAVENOUS
  Filled 2017-04-10: qty 80

## 2017-04-10 MED ORDER — FUROSEMIDE 10 MG/ML IJ SOLN
20.0000 mg | Freq: Once | INTRAMUSCULAR | Status: AC
  Administered 2017-04-10: 20 mg via INTRAVENOUS
  Filled 2017-04-10: qty 2

## 2017-04-10 MED ORDER — TIOTROPIUM BROMIDE MONOHYDRATE 18 MCG IN CAPS
18.0000 ug | ORAL_CAPSULE | Freq: Every morning | RESPIRATORY_TRACT | Status: DC
  Administered 2017-04-11 – 2017-04-12 (×2): 18 ug via RESPIRATORY_TRACT
  Filled 2017-04-10: qty 5

## 2017-04-10 MED ORDER — ONDANSETRON HCL 4 MG PO TABS: 4.0000 mg | ORAL_TABLET | Freq: Four times a day (QID) | ORAL | Status: DC | PRN

## 2017-04-10 MED ORDER — NEBIVOLOL HCL 5 MG PO TABS
5.0000 mg | ORAL_TABLET | Freq: Every day | ORAL | Status: DC
  Administered 2017-04-10 – 2017-04-12 (×3): 5 mg via ORAL
  Filled 2017-04-10 (×3): qty 1

## 2017-04-10 MED ORDER — SODIUM CHLORIDE 0.9 % IV SOLN: 250.0000 mL | INTRAVENOUS | Status: DC | PRN

## 2017-04-10 MED ORDER — SODIUM CHLORIDE 0.9 % IV SOLN
Freq: Once | INTRAVENOUS | Status: AC
  Administered 2017-04-10: 18:00:00 via INTRAVENOUS

## 2017-04-10 MED ORDER — SODIUM CHLORIDE 0.9 % IV SOLN
10.0000 mL/h | Freq: Once | INTRAVENOUS | Status: AC
  Administered 2017-04-10: 10 mL/h via INTRAVENOUS

## 2017-04-10 MED ORDER — ACETAMINOPHEN 650 MG RE SUPP: 650.0000 mg | Freq: Four times a day (QID) | RECTAL | Status: DC | PRN

## 2017-04-10 MED ORDER — SUCRALFATE 1 G PO TABS
1.0000 g | ORAL_TABLET | Freq: Two times a day (BID) | ORAL | Status: DC
  Administered 2017-04-10 – 2017-04-12 (×4): 1 g via ORAL
  Filled 2017-04-10 (×4): qty 1

## 2017-04-10 MED ORDER — PANTOPRAZOLE SODIUM 40 MG IV SOLR
40.0000 mg | Freq: Two times a day (BID) | INTRAVENOUS | Status: DC
  Administered 2017-04-10 – 2017-04-11 (×3): 40 mg via INTRAVENOUS
  Filled 2017-04-10 (×3): qty 40

## 2017-04-10 MED ORDER — PEG-KCL-NACL-NASULF-NA ASC-C 100 G PO SOLR
1.0000 | Freq: Once | ORAL | Status: AC
  Administered 2017-04-10: 200 g via ORAL
  Filled 2017-04-10: qty 1

## 2017-04-10 MED ORDER — SERTRALINE HCL 100 MG PO TABS
100.0000 mg | ORAL_TABLET | Freq: Every day | ORAL | Status: DC
  Administered 2017-04-10 – 2017-04-12 (×3): 100 mg via ORAL
  Filled 2017-04-10 (×3): qty 1

## 2017-04-10 MED ORDER — ACETAMINOPHEN 325 MG PO TABS
650.0000 mg | ORAL_TABLET | Freq: Four times a day (QID) | ORAL | Status: DC | PRN
  Administered 2017-04-10 – 2017-04-11 (×2): 650 mg via ORAL
  Filled 2017-04-10 (×2): qty 2

## 2017-04-10 MED ORDER — SODIUM CHLORIDE 0.9% FLUSH
3.0000 mL | Freq: Two times a day (BID) | INTRAVENOUS | Status: DC
  Administered 2017-04-10 – 2017-04-11 (×4): 3 mL via INTRAVENOUS

## 2017-04-10 NOTE — H&P (Signed)
[facility] History and Physical  [name] VHQ:[id number] DOB: 12-Feb-1941 DOA: 04/10/2017  Referring physician: Ashok Cordia, MD [facility] PCP: Donald Prose, MD  Cardiology: Lossie FaesArdis Hughs Pulmonology: [place]   Chief Complaint: sent by PCP office with low hemoglobin   HPI: [name] is a 76 y.o. 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 Pt was seen by cardiology and pulm yesterday for surgical clearance for total right hip replacement. Routine labs done during visit showed hgb 8.4 and pt instructed to see PCP and subsequently sent to ED. Pt does report episode of dizziness yesterday causing near fall. She describes chronic sob that has been slightly worse over the last 'few weeks'. She has occasional midsternal cp, but this is chronic in nature and unchanged. Pt takes iron supplement so stool is always dark. She denies any hematochezia or hematemesis. No recent abdominal pain, n/v/d.    ED COURSE Hbg 8.2<<8.4 on 7/18. Prior to this, last CBC show hgb 13.5 in 07/2015. BP somewhat soft at 103/58 on arrival, but has improved with IVFs. Protonix gtt started by EDP.   Review of Systems:  As stated in hpi, otherwise negative   Past Medical History:  Diagnosis Date  . Abdominal pain, unspecified site   . Anemia    Iron deficiency anemia  . Anxiety   . Aortic stenosis   . CAD (coronary artery disease)    psot bypass x2 to diagonal and obtuse marginal. SVG graft-01/08/2009  . GERD (gastroesophageal reflux disease)   . History of cardiovascular stress test    Myoview 7/16:  Low risk, no ischemia or scar, EF 72%  . History of echocardiogram    Echo 7/16:  EF 55-60%, no RWMA, Gr 2 DD, AVR ok (mean 20 mmHg), PASP 40 mmHg  . History of GI bleed    With AVM  . Hypercholesterolemia   .  Hyperlipidemia   . Hypertension   . Iron deficiency anemia   . Myocardial infarction ([place]) 2010  . Peripheral vascular disease ([place])   . Shoulder impingement syndrome    Right shoulder    Social History:  reports that she has been smoking Cigarettes.  She has a 52.00 pack-year smoking history. She has never used smokeless tobacco. She reports that she does not drink alcohol or use drugs.  Allergies  Allergen Reactions  . Codeine Other (See Comments)    Feeling intoxicated    Family History  Problem Relation Age of Onset  . Hypertension Mother   . Kidney disease Mother   . Hypertension Father   . Hypertension Sister   . Hypertension Brother   . Hypertension Daughter   . Diabetes Brother   . Heart attack Neg Hx   . Stroke Neg Hx      Prior to Admission medications   Medication Sig Start Date End Date Taking? Authorizing Provider  amLODipine (NORVASC) 10 MG tablet TAKE 1 TABLET BY MOUTH EVERY DAY 07/29/16   Jerline Pain, MD  aspirin 81 MG tablet Take 81 mg by mouth daily.    [provider]  atorvastatin (LIPITOR) 80 MG tablet TAKE 1 TABLET BY MOUTH EVERY DAY 02/06/17   Jerline Pain, MD  BYSTOLIC 5 MG tablet TAKE 1 TABLET BY MOUTH EVERY DAY 04/01/17   Jerline Pain, MD  Calcium Carbonate-Vitamin D (CALCIUM 600+D) 600-400 MG-UNIT per tablet Take 1 tablet by mouth daily.    [provider]  ferrous sulfate 325 (65 FE) MG tablet Take 325 mg by mouth daily with breakfast.    [provider]  furosemide (LASIX) 20 MG tablet TAKE 1 TABLET BY MOUTH EVERY DAY AS NEEDED 01/28/17   Almyra Deforest, PA  glycopyrrolate (ROBINUL) 2 MG tablet Take 2 mg by mouth 2 (two) times daily. 12/09/14   [provider]  lisinopril-hydrochlorothiazide (PRINZIDE,ZESTORETIC) 20-25 MG tablet Take 1/2 tablet (10-12.5mg ) by mouth two times a day 08/13/16   Jerline Pain, MD  LORazepam (ATIVAN) 0.5 MG tablet Take 0.5 mg by mouth at bedtime as needed for anxiety or sleep.      [provider]  meloxicam (MOBIC) 7.5 MG tablet Take 1 tablet (7.5 mg total) by mouth 2 (two) times daily as needed for pain. for pain 11/13/16   Jessy Oto, MD  Multiple Vitamin (MULTIVITAMIN) tablet Take 1 tablet by mouth daily.    [provider]  omeprazole (PRILOSEC) 40 MG capsule Take 40 mg by mouth daily.    [provider]  potassium chloride (MICRO-K) 10 MEQ CR capsule TAKE 2 CAPSULES BY MOUTH TWICE A DAY 08/21/16   Jerline Pain, MD  sertraline (ZOLOFT) 100 MG tablet TAKE 1 & 1/2 TABLET BY MOUTH ONCE A DAY 10/30/15   [provider]  sucralfate (CARAFATE) 1 G tablet TAKE 1 TABLET BY MOUTH 4 TIMES DAILY - WITH MEALS AND AT BEDTIME. 05/15/15   Willia Craze, NP  tiotropium (SPIRIVA HANDIHALER) 18 MCG inhalation capsule Place 1 capsule (18 mcg total) into inhaler and inhale every morning. 04/09/17   Donita Brooks, NP  traMADol (ULTRAM) 50 MG tablet TAKE 1 TABLET BY MOUTH EVERY 6 HOURS AS NEEDED FOR MODERATE PAIN 12/16/16   Jessy Oto, MD   Physical Exam: Vitals:   04/10/17 1007  BP: (!) 103/58  Pulse: 65  Resp: 17  Temp: 98.4 F (36.9 C)  TempSrc: Oral  SpO2: 97%  Weight: 72.1 kg (159 lb)  Height: 4\' 11"  (1.499 m)    Wt Readings from Last 3 Encounters:  04/10/17 72.1 kg (159 lb)  04/09/17 71.7 kg (158 lb)  04/08/17 72.3 kg (159 lb 6.4 oz)    General: Appears calm and comfortable, awake, alert in NAD Eyes: EOMI, normal lids, iris ENT: grossly normal hearing, lips & tongue, mucous membranes moist Neck: no LAD, masses or thyromegaly Cardiovascular: RRR, no m/r/g. No LE edema.  Respiratory: diminished throughout, no w/r/r. Normal respiratory effort. Abdomen: obese, soft, NT, BS+ Skin: no rash or induration seen on limited exam Musculoskeletal: grossly normal tone BUE/BLE Psychiatric: grossly normal mood and affect, speech fluent and appropriate Neurologic: CN 2-12 grossly intact          Labs on Admission:  Basic Metabolic  Panel:  Recent Labs Lab 04/08/17 0841 04/10/17 1028  NA 144 141  K 4.0 3.8  CL 104 107  CO2 16* 24  GLUCOSE 111* 108*  BUN 14 14  CREATININE 0.67 0.71  CALCIUM 9.4 9.4   Liver Function Tests:  Recent Labs Lab 04/08/17 0841 04/10/17 1028  AST 23 26  ALT 19 22  ALKPHOS 105 87  BILITOT <0.2 0.3  PROT 7.2 7.3  ALBUMIN 3.9 3.5   No results for input(s): LIPASE, AMYLASE in the last 168 hours. No results for input(s): AMMONIA in the last 168 hours. CBC:  Recent Labs Lab 04/08/17 0841 04/10/17 1028  WBC 6.8 6.4  HGB 8.4* 8.2*  HCT 28.6* 28.2*  MCV 79 79.7  PLT 265 242   Cardiac Enzymes: No results for input(s): CKTOTAL, CKMB, CKMBINDEX, TROPONINI in the last 168 hours.  BNP (last 3 results) No results for input(s): BNP in the last 8760 hours.  ProBNP (last 3 results)  Recent Labs  04/08/17 0841  PROBNP 258     Serum creatinine: 0.71 mg/dL 04/10/17 1028 Estimated creatinine clearance: 51.8 mL/min  CBG: No results for input(s): GLUCAP in the last 168 hours.  Radiological Exams on Admission: Dg Chest 2 View  Result Date: 04/09/2017 CLINICAL DATA:  Preoperative evaluation for upcoming hip replacement EXAM: CHEST  2 VIEW COMPARISON:  06/28/2016 FINDINGS: Postsurgical changes are again noted. Lungs are mildly hyperinflated. No focal infiltrate or sizable effusion is seen. Stable soft tissue density is noted along the left chest wall. No bony abnormality is seen. IMPRESSION: COPD without acute abnormality. Electronically Signed   By: Inez Catalina M.D.   On: 04/09/2017 14:17    EKG: Independently reviewed. NSR at 63bpm, no acute finding, unchanged compared to earlier ekg  Assessment/Plan Active Problems:   Peripheral vascular disease (HCC)   Iron deficiency anemia, unspecified   Major depressive disorder, single episode, unspecified   Essential hypertension   Coronary artery disease involving coronary bypass graft of native heart without angina pectoris    H/O aortic valve replacement   S/P AVR   Tobacco abuse   History of GI bleed   Symptomatic anemia   Anemia, ABL + chronic iron def  -Hx of AVMs remotely 2008 as well as ulcerated cecum and ileocecal valve on colonoscopy in 2009 -FOB +. Will ask GI to evaluate -PPI gtt started in ED so will finish and await GI recs. NPO  -pt uncertain about how often she takes meloxicam. Avoid for now -Transfuse 2units of PRBCs. Will give 20mg  Lasix in between as holding diuretics, serial H/Hs  Hx hypertension -soft BP on admit -will hold lisinopril/hctz, norvasc and reassess in am. On bystolic with holding parameters -monitor  Hx CAD s/p CABG -nonspecific cardiac complaints that seem chronic in nature but with some increasing sob likely r/t anemia -ekg unremarkable -cycle troponins.  Tobacco abuse -Pt counseled    Code Status: full code DVT Prophylaxis: active bleed Family Communication: daughter unavailable at time of exam Disposition Plan: Pending Improvement    Dionne Milo, NP [phone] [facility] [email or web address] Password [email or web address]

## 2017-04-10 NOTE — Progress Notes (Signed)
New pt admission from ED. Pt brought to the floor in stable condition. Vitals taken. Initial Assessment done. All immediate pertinent needs to patient addressed. Patient Guide given to patient. Important safety instructions relating to hospitalization reviewed with patient. Patient verbalized understanding. Will continue to monitor pt. 

## 2017-04-10 NOTE — Consult Note (Signed)
Referring Provider: [facility] Primary Care Physician:  Donald Prose, MD Primary Gastroenterologist:  Owens Loffler,  MD  Reason for Consultation:  anemia  ASSESSMENT AND PLAN:   49. 76 yo female with multiple medical problems here with symptomatic iron deficiency anemia (recurrent) and heme positive stools. Hx of gastric and intestinal AVMs. She needs EGD or enteroscopy and a colonoscopy as the bowel prep for one she had in 2016 was poor.  -hold home iron, can make hard to clean out for bowel prep -she is getting blood transfusion now. Monitor CBC -BID PPI is fine, not actively bleeding.  -EGD and colonoscopy in the am. Patient understands the risks and benefits of the procedures and she agrees to proceed.    2. CAD. Chest pain earlier. Trop negative. No significant change on EKG.Chest pain may be secondary to anemia.   3. COPD. No acute CXR changes  HPI: [name] is a 76 y.o. female with multiple medical problems. GI history pertinent for rectosigmoid colectomy 2005 for diverticular disease, large gastric AVM in 2012, small bowel AVMs  (capsule 2012). She had a Colonoscopy in Sept 2016 for bowel changes. Prep was poor, 3 month recall recommended but wasn't done.   Patient is in ED with weakness, near syncope, intermittent chest pain. Pain is dull, non-radiating, non exertional. She has chronic SOB which is at baseline. Occasional non-productive cough.  Her hgb in Nov 2016 was 13.4, down to mid 8 range now. MCV 79, ferritin 23. Normal  BUN,. Stool heme +. She takes a daily ASA and per friend in room she also takes Mobic everyday.   She doesn't take iron everyday, maybe a few times a week. Stools sometimes dark with iron but no black stools. Stools vary in consistency based on diet. No abdominal pain. No nausea.   Past Medical History:  Diagnosis Date  . Abdominal pain, unspecified site   . Anemia    Iron deficiency anemia  . Anxiety   . Aortic stenosis   . CAD  (coronary artery disease)    psot bypass x2 to diagonal and obtuse marginal. SVG graft-01/08/2009  . GERD (gastroesophageal reflux disease)   . History of cardiovascular stress test    Myoview 7/16:  Low risk, no ischemia or scar, EF 72%  . History of echocardiogram    Echo 7/16:  EF 55-60%, no RWMA, Gr 2 DD, AVR ok (mean 20 mmHg), PASP 40 mmHg  . History of GI bleed    With AVM  . Hypercholesterolemia   . Hyperlipidemia   . Hypertension   . Iron deficiency anemia   . Myocardial infarction ([place]) 2010  . Peripheral vascular disease ([place])   . Shoulder impingement syndrome    Right shoulder    Past Surgical History:  Procedure Laterality Date  . ABDOMINAL HYSTERECTOMY    . AORTIC VALVE REPLACEMENT     [facility] #[id number]  . CARDIAC CATHETERIZATION N/A 08/11/2015   Procedure: Right Heart Cath;  Surgeon: Jolaine Artist, MD;  Location: [facility];  Service: Cardiovascular;  Laterality: N/A;  . CARDIAC SURGERY  2010   , 2 blockages  . CHOLECYSTECTOMY    . CORONARY ARTERY BYPASS GRAFT    . HEEL SPUR EXCISION  2000   left heel  . SPINE SURGERY  2003   Herniated diskectomy  . WRIST SURGERY     right wrist    Prior to Admission medications   Medication Sig Start Date End Date Taking? Authorizing  Provider  amLODipine (NORVASC) 10 MG tablet TAKE 1 TABLET BY MOUTH EVERY DAY 07/29/16  Yes Jerline Pain, MD  aspirin 81 MG tablet Take 81 mg by mouth daily.   Yes [provider]  atorvastatin (LIPITOR) 80 MG tablet TAKE 1 TABLET BY MOUTH EVERY DAY 02/06/17  Yes Jerline Pain, MD  BYSTOLIC 5 MG tablet TAKE 1 TABLET BY MOUTH EVERY DAY 04/01/17  Yes Jerline Pain, MD  Calcium Carbonate-Vitamin D (CALCIUM 600+D) 600-400 MG-UNIT per tablet Take 1 tablet by mouth daily.   Yes [provider]  ferrous sulfate 325 (65 FE) MG tablet Take 325 mg by mouth daily with breakfast.   Yes [provider]  furosemide (LASIX) 20 MG tablet TAKE 1 TABLET BY MOUTH EVERY DAY AS  NEEDED Patient taking differently: TAKE 1 TABLET BY MOUTH EVERY DAY AS NEEDED FOR FLUID 01/28/17  Yes Almyra Deforest, PA  glycopyrrolate (ROBINUL) 2 MG tablet Take 2 mg by mouth 2 (two) times daily. 12/09/14  Yes [provider]  lisinopril-hydrochlorothiazide (PRINZIDE,ZESTORETIC) 20-25 MG tablet Take 1/2 tablet (10-12.5mg ) by mouth two times a day 08/13/16  Yes Jerline Pain, MD  LORazepam (ATIVAN) 0.5 MG tablet Take 0.5 mg by mouth at bedtime as needed for anxiety or sleep.    Yes [provider]  meloxicam (MOBIC) 7.5 MG tablet Take 1 tablet (7.5 mg total) by mouth 2 (two) times daily as needed for pain. for pain 11/13/16  Yes Jessy Oto, MD  Multiple Vitamin (MULTIVITAMIN) tablet Take 1 tablet by mouth daily.   Yes [provider]  omeprazole (PRILOSEC) 40 MG capsule Take 40 mg by mouth daily.   Yes [provider]  potassium chloride (MICRO-K) 10 MEQ CR capsule TAKE 2 CAPSULES BY MOUTH TWICE A DAY 08/21/16  Yes Jerline Pain, MD  sertraline (ZOLOFT) 100 MG tablet TAKE 2 TABLETS (200MG ) BY MOUTH ONCE A DAY 10/30/15  Yes [provider]  sucralfate (CARAFATE) 1 G tablet TAKE 1 TABLET BY MOUTH 4 TIMES DAILY - WITH MEALS AND AT BEDTIME. Patient taking differently: TAKE 1 TABLET BY MOUTH TWICE DAILY 05/15/15  Yes Willia Craze, NP  tiotropium (SPIRIVA HANDIHALER) 18 MCG inhalation capsule Place 1 capsule (18 mcg total) into inhaler and inhale every morning. 04/09/17  Yes Ollis, Cleaster Corin, NP  traMADol (ULTRAM) 50 MG tablet TAKE 1 TABLET BY MOUTH EVERY 6 HOURS AS NEEDED FOR MODERATE PAIN 12/16/16  Yes Jessy Oto, MD    Current Facility-Administered Medications  Medication Dose Route Frequency Provider Last Rate Last Dose  . 0.9 %  sodium chloride infusion  10 mL/hr Intravenous Once Lajean Saver, MD      . 0.9 %  sodium chloride infusion   Intravenous Once Dionne Milo, NP      . 0.9 %  sodium chloride infusion  250 mL Intravenous PRN Dionne Milo, NP      . acetaminophen (TYLENOL) tablet 650 mg  650 mg Oral Q6H PRN Dionne Milo, NP       Or  . acetaminophen (TYLENOL) suppository 650 mg  650 mg Rectal Q6H PRN Dionne Milo, NP      . Derrill Memo ON 04/11/2017] ferrous sulfate tablet 325 mg  325 mg Oral Q breakfast Dionne Milo, NP      . furosemide (LASIX) injection 20 mg  20 mg Intravenous Once Dionne Milo, NP      . LORazepam (ATIVAN) tablet 0.5  mg  0.5 mg Oral QHS PRN Dionne Milo, NP      . nebivolol (BYSTOLIC) tablet 5 mg  5 mg Oral Daily Dionne Milo, NP      . ondansetron [facility]) tablet 4 mg  4 mg Oral Q6H PRN Dionne Milo, NP       Or  . ondansetron [facility]) injection 4 mg  4 mg Intravenous Q6H PRN Dionne Milo, NP      . sertraline (ZOLOFT) tablet 100 mg  100 mg Oral Daily Dionne Milo, NP      . sodium chloride flush (NS) 0.9 % injection 3 mL  3 mL Intravenous Q12H Dionne Milo, NP      . sodium chloride flush (NS) 0.9 % injection 3 mL  3 mL Intravenous Q12H Dionne Milo, NP      . sodium chloride flush (NS) 0.9 % injection 3 mL  3 mL Intravenous PRN Dionne Milo, NP      . sucralfate (CARAFATE) tablet 1 g  1 g Oral BID Dionne Milo, NP      . tiotropium (SPIRIVA) inhalation capsule 18 mcg  18 mcg Inhalation q morning - 10a Dionne Milo, NP       Current Outpatient Prescriptions  Medication Sig Dispense Refill  . amLODipine (NORVASC) 10 MG tablet TAKE 1 TABLET BY MOUTH EVERY DAY 30 tablet 11  . aspirin 81 MG tablet Take 81 mg by mouth daily.    Marland Kitchen atorvastatin (LIPITOR) 80 MG tablet TAKE 1 TABLET BY MOUTH EVERY DAY 30 tablet 5  . BYSTOLIC 5 MG tablet TAKE 1 TABLET BY MOUTH EVERY DAY 90 tablet 0  . Calcium Carbonate-Vitamin D (CALCIUM 600+D) 600-400 MG-UNIT per tablet Take 1 tablet by mouth daily.    . ferrous sulfate 325 (65 FE) MG tablet Take 325 mg by mouth daily with breakfast.    . furosemide (LASIX) 20 MG tablet TAKE 1  TABLET BY MOUTH EVERY DAY AS NEEDED (Patient taking differently: TAKE 1 TABLET BY MOUTH EVERY DAY AS NEEDED FOR FLUID) 90 tablet 1  . glycopyrrolate (ROBINUL) 2 MG tablet Take 2 mg by mouth 2 (two) times daily.  1  . lisinopril-hydrochlorothiazide (PRINZIDE,ZESTORETIC) 20-25 MG tablet Take 1/2 tablet (10-12.5mg ) by mouth two times a day 90 tablet 3  . LORazepam (ATIVAN) 0.5 MG tablet Take 0.5 mg by mouth at bedtime as needed for anxiety or sleep.     . meloxicam (MOBIC) 7.5 MG tablet Take 1 tablet (7.5 mg total) by mouth 2 (two) times daily as needed for pain. for pain 30 tablet 3  . Multiple Vitamin (MULTIVITAMIN) tablet Take 1 tablet by mouth daily.    Marland Kitchen omeprazole (PRILOSEC) 40 MG capsule Take 40 mg by mouth daily.    . potassium chloride (MICRO-K) 10 MEQ CR capsule TAKE 2 CAPSULES BY MOUTH TWICE A DAY 120 capsule 10  . sertraline (ZOLOFT) 100 MG tablet TAKE 2 TABLETS (200MG ) BY MOUTH ONCE A DAY  1  . sucralfate (CARAFATE) 1 G tablet TAKE 1 TABLET BY MOUTH 4 TIMES DAILY - WITH MEALS AND AT BEDTIME. (Patient taking differently: TAKE 1 TABLET BY MOUTH TWICE DAILY) 90 tablet 1  . tiotropium (SPIRIVA HANDIHALER) 18 MCG inhalation capsule Place 1 capsule (18 mcg total) into inhaler and inhale every morning. 30 capsule 12  . traMADol (ULTRAM) 50 MG tablet TAKE 1 TABLET BY MOUTH EVERY 6 HOURS AS NEEDED FOR MODERATE PAIN 30 tablet 0  Allergies as of 04/10/2017 - Review Complete 04/10/2017  Allergen Reaction Noted  . Codeine Other (See Comments) 10/14/2011    Family History  Problem Relation Age of Onset  . Hypertension Mother   . Kidney disease Mother   . Hypertension Father   . Hypertension Sister   . Hypertension Brother   . Hypertension Daughter   . Diabetes Brother   . Heart attack Neg Hx   . Stroke Neg Hx     Social History   Social History  . Marital status: Single    Spouse name: N/A  . Number of children: 3  . Years of education: N/A   Occupational History  . Retired     Social History Main Topics  . Smoking status: Current Every Day Smoker    Packs/day: 1.00    Years: 52.00    Types: Cigarettes    Last attempt to quit: 01/21/2017  . Smokeless tobacco: Never Used     Comment: Now down to 3-4 cigs per day  . Alcohol use No  . Drug use: No  . Sexual activity: Not on file   Other Topics Concern  . Not on file   Social History Narrative  . No narrative on file    Review of Systems: All systems reviewed and negative except where noted in HPI.  Physical Exam: Vital signs in last 24 hours: Temp:  [98.4 F (36.9 C)] 98.4 F (36.9 C) (07/19 1256) Pulse Rate:  [59-70] 59 (07/19 1256) Resp:  [17-24] 24 (07/19 1256) BP: (103-116)/(55-60) 116/55 (07/19 1256) SpO2:  [97 %] 97 % (07/19 1256) Weight:  [159 lb (72.1 kg)] 159 lb (72.1 kg) (07/19 1007)   General:   Alert, obese black female in NAD Psych:  Pleasant, cooperative. Normal mood and affect. Eyes:  Pupils equal, sclera clear, no icterus.   Conjunctiva pink. Ears:  Normal auditory acuity. Nose:  No deformity, discharge,  or lesions. Neck:  Supple; no masses Lungs:  Occasional wheeze, otherwise CTA.  Heart:  Regular rate and rhythm; no murmurs, no edema Abdomen:  Soft, non-distended, nontender, BS active, no palp mass    Rectal:  Deferred  Msk:  Symmetrical without gross deformities. . Pulses:  Normal pulses noted. Neurologic:  Alert and  oriented x4;  grossly normal neurologically. Skin:  Intact without significant lesions or rashes..   Intake/Output from previous day: No intake/output data recorded. Intake/Output this shift: Total I/O In: 600 [IV Piggyback:600] Out: -   Lab Results:  Recent Labs  04/08/17 0841 04/10/17 1028  WBC 6.8 6.4  HGB 8.4* 8.2*  HCT 28.6* 28.2*  PLT 265 242   BMET  Recent Labs  04/08/17 0841 04/10/17 1028  NA 144 141  K 4.0 3.8  CL 104 107  CO2 16* 24  GLUCOSE 111* 108*  BUN 14 14  CREATININE 0.67 0.71  CALCIUM 9.4 9.4   LFT  Recent  Labs  04/08/17 0841 04/10/17 1028  PROT 7.2 7.3  ALBUMIN 3.9 3.5  AST 23 26  ALT 19 22  ALKPHOS 105 87  BILITOT <0.2 0.3  BILIDIR 0.06  --    PT/INR  Recent Labs  04/10/17 1028  LABPROT 15.2  INR 1.19   Hepatitis Panel No results for input(s): HEPBSAG, HCVAB, HEPAIGM, HEPBIGM in the last 72 hours.  Studies/Results: Dg Chest 2 View  Result Date: 04/09/2017 CLINICAL DATA:  Preoperative evaluation for upcoming hip replacement EXAM: CHEST  2 VIEW COMPARISON:  06/28/2016 FINDINGS: Postsurgical changes are again  noted. Lungs are mildly hyperinflated. No focal infiltrate or sizable effusion is seen. Stable soft tissue density is noted along the left chest wall. No bony abnormality is seen. IMPRESSION: COPD without acute abnormality. Electronically Signed   By: Inez Catalina M.D.   On: 04/09/2017 14:17     Tye Savoy, NP-C @  04/10/2017, 1:15 PM  Pager number [phone]

## 2017-04-10 NOTE — ED Provider Notes (Signed)
[facility] DEPT Provider Note   CSN: [id number] Arrival date & time: 04/10/17  5427     History   Chief Complaint Chief Complaint  Patient presents with  . Hypotension    HPI [name] is a 76 y.o. female.  Patient with hx avr, cad/cabg, c/o generalized weakness in the past few days. States feels shaky. No syncope. Denies fever or chills.  States has had some mid chest pain - comes and goes, at rest, lasts minutes.  Mild-mod. Dull. Non radiating. Mild increased sob. No nv. No diaphoresis. Compliant w home meds, denies change. Denies melena, blood in stool. States is on iron pills. No dysuria.    The history is provided by the patient.    Past Medical History:  Diagnosis Date  . Abdominal pain, unspecified site   . Anemia    Iron deficiency anemia  . Anxiety   . Aortic stenosis   . CAD (coronary artery disease)    psot bypass x2 to diagonal and obtuse marginal. SVG graft-01/08/2009  . GERD (gastroesophageal reflux disease)   . History of cardiovascular stress test    Myoview 7/16:  Low risk, no ischemia or scar, EF 72%  . History of echocardiogram    Echo 7/16:  EF 55-60%, no RWMA, Gr 2 DD, AVR ok (mean 20 mmHg), PASP 40 mmHg  . History of GI bleed    With AVM  . Hypercholesterolemia   . Hyperlipidemia   . Hypertension   . Iron deficiency anemia   . Myocardial infarction ([place]) 2010  . Peripheral vascular disease ([place])   . Shoulder impingement syndrome    Right shoulder    Patient Active Problem List   Diagnosis Date Noted  . PAH (pulmonary artery hypertension) ([place])   . Epigastric burning sensation 04/19/2015  . Chronic diarrhea 04/19/2015  . History of GI bleed 03/14/2015  . Long-term use of high-risk medication 01/11/2015  . Dysphagia 01/11/2015  . S/P AVR 01/11/2015  . Dyspnea 01/11/2015  . Tobacco abuse 01/11/2015  . Numbness of anterior thigh-Left 12/09/2014  . Pain in joint, lower leg 12/09/2014  . Essential hypertension 07/14/2014  .  Coronary artery disease involving coronary bypass graft of native heart without angina pectoris 07/14/2014  . Dilated aortic root ([place]) 07/14/2014  . H/O aortic valve replacement 07/14/2014  . Tobacco use 07/14/2014  . Obesity 07/14/2014  . Palpitations 01/03/2014  . Aortic valve disorder 06/19/2013  . Heart valve replaced by other means 06/19/2013  . Flatulence, eructation, and gas pain 06/19/2013  . Pure hypercholesterolemia 06/19/2013  . 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 Hypopotassemia 06/19/2013  . Anemia, unspecified 06/19/2013  . Anxiety state, unspecified 06/19/2013  . Esophageal reflux 06/19/2013  . Personal history of other diseases of digestive system 06/19/2013  . Iron deficiency anemia, unspecified 06/19/2013  . Proteinuria 06/19/2013  . Arthropathy, unspecified, site unspecified 06/19/2013  . Major depressive disorder, single episode, unspecified 06/19/2013  . 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 Tobacco use disorder 06/19/2013  . Peripheral vascular disease ([place]) 10/14/2012  . Atherosclerosis of aorta ([place]) 10/14/2012  . Pain in limb 10/14/2012  . Atherosclerosis of abdominal aorta ([place]) 10/16/2011  . PVD (peripheral vascular disease) ([place]) 10/16/2011    Past Surgical History:  Procedure Laterality Date  . ABDOMINAL HYSTERECTOMY    . AORTIC VALVE REPLACEMENT     [facility] #[id number]  . CARDIAC CATHETERIZATION N/A 08/11/2015   Procedure: Right Heart Cath;  Surgeon: Jolaine Artist, MD;  Location:  [facility];  Service: Cardiovascular;  Laterality: N/A;  . CARDIAC SURGERY  2010   , 2 blockages  . CHOLECYSTECTOMY    . CORONARY ARTERY BYPASS GRAFT    . HEEL SPUR EXCISION  2000   left heel  . SPINE SURGERY  2003   Herniated diskectomy  . WRIST SURGERY     right wrist    OB History    No data available       Home Medications    Prior to Admission medications   Medication Sig Start Date End Date Taking? Authorizing Provider    amLODipine (NORVASC) 10 MG tablet TAKE 1 TABLET BY MOUTH EVERY DAY 07/29/16   Jerline Pain, MD  aspirin 81 MG tablet Take 81 mg by mouth daily.    [provider]  atorvastatin (LIPITOR) 80 MG tablet TAKE 1 TABLET BY MOUTH EVERY DAY 02/06/17   Jerline Pain, MD  BYSTOLIC 5 MG tablet TAKE 1 TABLET BY MOUTH EVERY DAY 04/01/17   Jerline Pain, MD  Calcium Carbonate-Vitamin D (CALCIUM 600+D) 600-400 MG-UNIT per tablet Take 1 tablet by mouth daily.    [provider]  ferrous sulfate 325 (65 FE) MG tablet Take 325 mg by mouth daily with breakfast.    [provider]  furosemide (LASIX) 20 MG tablet TAKE 1 TABLET BY MOUTH EVERY DAY AS NEEDED 01/28/17   Almyra Deforest, PA  glycopyrrolate (ROBINUL) 2 MG tablet Take 2 mg by mouth 2 (two) times daily. 12/09/14   [provider]  lisinopril-hydrochlorothiazide (PRINZIDE,ZESTORETIC) 20-25 MG tablet Take 1/2 tablet (10-12.5mg ) by mouth two times a day 08/13/16   Jerline Pain, MD  LORazepam (ATIVAN) 0.5 MG tablet Take 0.5 mg by mouth at bedtime as needed for anxiety or sleep.     [provider]  meloxicam (MOBIC) 7.5 MG tablet Take 1 tablet (7.5 mg total) by mouth 2 (two) times daily as needed for pain. for pain 11/13/16   Jessy Oto, MD  Multiple Vitamin (MULTIVITAMIN) tablet Take 1 tablet by mouth daily.    [provider]  omeprazole (PRILOSEC) 40 MG capsule Take 40 mg by mouth daily.    [provider]  potassium chloride (MICRO-K) 10 MEQ CR capsule TAKE 2 CAPSULES BY MOUTH TWICE A DAY 08/21/16   Jerline Pain, MD  sertraline (ZOLOFT) 100 MG tablet TAKE 1 & 1/2 TABLET BY MOUTH ONCE A DAY 10/30/15   [provider]  sucralfate (CARAFATE) 1 G tablet TAKE 1 TABLET BY MOUTH 4 TIMES DAILY - WITH MEALS AND AT BEDTIME. 05/15/15   Willia Craze, NP  tiotropium (SPIRIVA HANDIHALER) 18 MCG inhalation capsule Place 1 capsule (18 mcg total) into inhaler and inhale every morning. 04/09/17   Donita Brooks, NP  traMADol (ULTRAM) 50 MG tablet TAKE 1 TABLET BY MOUTH EVERY 6 HOURS AS NEEDED FOR MODERATE PAIN 12/16/16   Jessy Oto, MD    Family History Family History  Problem Relation Age of Onset  . Hypertension Mother   . Kidney disease Mother   . Hypertension Father   . Hypertension Sister   . Hypertension Brother   . Hypertension Daughter   . Diabetes Brother   . Heart attack Neg Hx   . Stroke Neg Hx     Social History Social History  Substance Use Topics  . Smoking status: Current Every Day Smoker    Packs/day: 1.00    Years: 52.00  Types: Cigarettes    Last attempt to quit: 01/21/2017  . Smokeless tobacco: Never Used     Comment: Now down to 3-4 cigs per day  . Alcohol use No     Allergies   Codeine   Review of Systems Review of Systems  Constitutional: Negative for fever.  HENT: Negative for sore throat.   Eyes: Negative for redness.  Respiratory: Positive for shortness of breath.   Cardiovascular: Positive for chest pain. Negative for leg swelling.  Gastrointestinal: Negative for abdominal pain, blood in stool and vomiting.  Endocrine: Negative for polyuria.  Genitourinary: Negative for dysuria and flank pain.  Musculoskeletal: Negative for back pain and neck pain.  Skin: Negative for rash.  Neurological: Negative for headaches.  Hematological: Does not bruise/bleed easily.  Psychiatric/Behavioral: Negative for confusion.     Physical Exam Updated Vital Signs BP (!) 103/58 (BP Location: Left Arm)   Pulse 65   Temp 98.4 F (36.9 C) (Oral)   Resp 17   Ht 1.499 m (4\' 11" )   Wt 72.1 kg (159 lb)   LMP  (LMP Unknown)   SpO2 97%   BMI 32.11 kg/m   Physical Exam  Constitutional: She appears well-developed and well-nourished. No distress.  HENT:  Mouth/Throat: Oropharynx is clear and moist.  Eyes: Conjunctivae are normal. No scleral icterus.  Neck: Neck supple. No tracheal deviation present. No thyromegaly present.  Cardiovascular: Normal  rate, regular rhythm and intact distal pulses.  Exam reveals no gallop and no friction rub.   Murmur heard. Pulmonary/Chest: Effort normal and breath sounds normal. No respiratory distress.  Abdominal: Soft. Normal appearance and bowel sounds are normal. She exhibits no distension and no mass. There is no tenderness. There is no rebound and no guarding. No hernia.  Genitourinary:  Genitourinary Comments: No cva tenderness. Scant amt v dark brown stool, heme pos  Musculoskeletal: She exhibits no edema.  Neurological: She is alert.  Skin: Skin is warm and dry. No rash noted. She is not diaphoretic.  Psychiatric: She has a normal mood and affect.  Nursing note and vitals reviewed.    ED Treatments / Results  Labs (all labs ordered are listed, but only abnormal results are displayed) Results for orders placed or performed during the hospital encounter of 04/10/17  Comprehensive metabolic panel  Result Value Ref Range   Sodium 141 135 - 145 mmol/L   Potassium 3.8 3.5 - 5.1 mmol/L   Chloride 107 101 - 111 mmol/L   CO2 24 22 - 32 mmol/L   Glucose, Bld 108 (H) 65 - 99 mg/dL   BUN 14 6 - 20 mg/dL   Creatinine, Ser 0.71 0.44 - 1.00 mg/dL   Calcium 9.4 8.9 - 10.3 mg/dL   Total Protein 7.3 6.5 - 8.1 g/dL   Albumin 3.5 3.5 - 5.0 g/dL   AST 26 15 - 41 U/L   ALT 22 14 - 54 U/L   Alkaline Phosphatase 87 38 - 126 U/L   Total Bilirubin 0.3 0.3 - 1.2 mg/dL   GFR calc non Af Amer >60 >60 mL/min   GFR calc Af Amer >60 >60 mL/min   Anion gap 10 5 - 15  CBC  Result Value Ref Range   WBC 6.4 4.0 - 10.5 K/uL   RBC 3.54 (L) 3.87 - 5.11 MIL/uL   Hemoglobin 8.2 (L) 12.0 - 15.0 g/dL   HCT 28.2 (L) 36.0 - 46.0 %   MCV 79.7 78.0 - 100.0 fL   MCH 23.2 (  L) 26.0 - 34.0 pg   MCHC 29.1 (L) 30.0 - 36.0 g/dL   RDW 15.2 11.5 - 15.5 %   Platelets 242 150 - 400 K/uL  Protime-INR  Result Value Ref Range   Prothrombin Time 15.2 11.4 - 15.2 seconds   INR 1.19   Reticulocytes  Result Value Ref Range   Retic  Ct Pct 3.9 (H) 0.4 - 3.1 %   RBC. 3.54 (L) 3.87 - 5.11 MIL/uL   Retic Count, Absolute 138.1 19.0 - 186.0 K/uL  I-stat troponin, ED  Result Value Ref Range   Troponin i, poc 0.00 0.00 - 0.08 ng/mL   Comment 3          POC occult blood, ED Provider will collect  Result Value Ref Range   Fecal Occult Bld POSITIVE (A) NEGATIVE  Type and screen  Result Value Ref Range   ABO/RH(D) O POS    Antibody Screen PENDING    Sample Expiration 04/13/2017    Dg Chest 2 View  Result Date: 04/09/2017 CLINICAL DATA:  Preoperative evaluation for upcoming hip replacement EXAM: CHEST  2 VIEW COMPARISON:  06/28/2016 FINDINGS: Postsurgical changes are again noted. Lungs are mildly hyperinflated. No focal infiltrate or sizable effusion is seen. Stable soft tissue density is noted along the left chest wall. No bony abnormality is seen. IMPRESSION: COPD without acute abnormality. Electronically Signed   By: Inez Catalina M.D.   On: 04/09/2017 14:17    EKG  EKG Interpretation  Date/Time:  Thursday April 10 2017 10:59:45 EDT Ventricular Rate:  63 PR Interval:    QRS Duration: 81 QT Interval:  451 QTC Calculation: 462 R Axis:   61 Text Interpretation:  Sinus rhythm Low voltage, precordial leads Nonspecific T abnrm, anterolateral leads No significant change since last tracing Confirmed by Lajean Saver [phone]) on 04/10/2017 11:38:21 AM       Radiology Dg Chest 2 View  Result Date: 04/09/2017 CLINICAL DATA:  Preoperative evaluation for upcoming hip replacement EXAM: CHEST  2 VIEW COMPARISON:  06/28/2016 FINDINGS: Postsurgical changes are again noted. Lungs are mildly hyperinflated. No focal infiltrate or sizable effusion is seen. Stable soft tissue density is noted along the left chest wall. No bony abnormality is seen. IMPRESSION: COPD without acute abnormality. Electronically Signed   By: Inez Catalina M.D.   On: 04/09/2017 14:17    Procedures Procedures (including critical care time)  Medications Ordered  in ED Medications  sodium chloride 0.9 % bolus 500 mL (not administered)     Initial Impression / Assessment and Plan / ED Course  I have reviewed the triage vital signs and the nursing notes.  Pertinent labs & imaging results that were available during my care of the patient were reviewed by me and considered in my medical decision making (see chart for details).  Iv ns. Labs.   Reviewed nursing notes and prior charts for additional history. Old charts, ?remote hx avm.   Given low hgb, several gm lower than prior check 2 years ago, symptomatic, soft bp, heme pos stool, will transfuse.  protonix 80 mg iv.     Medical service consulted for admission.   GI consulted as well.  Recheck abd soft nt, bp sl improved.     Final Clinical Impressions(s) / ED Diagnoses   Final diagnoses:  None    New Prescriptions New Prescriptions   No medications on file     Lajean Saver, MD 04/10/17 1142

## 2017-04-10 NOTE — ED Triage Notes (Signed)
Pt sts sent here by PCP for weakness, low normal BP and possibly "loosing blood"

## 2017-04-10 NOTE — Progress Notes (Signed)
Pt's second unit blood transfusion started, pre vitals checked and recorded, no any complain of pain, during first 66min, stayed with the patient, no any side effects and reactions noted

## 2017-04-10 NOTE — ED Notes (Signed)
Pt aware that a urine sample is needed.  

## 2017-04-11 ENCOUNTER — Observation Stay (HOSPITAL_COMMUNITY): Payer: Medicare Other | Admitting: Anesthesiology

## 2017-04-11 ENCOUNTER — Encounter (HOSPITAL_COMMUNITY)
Admission: EM | Disposition: A | Discharge status: Home / Self Care | Payer: Self-pay | Source: Home / Self Care | Attending: Emergency Medicine

## 2017-04-11 DIAGNOSIS — D509 Iron deficiency anemia, unspecified: Secondary | ICD-10-CM

## 2017-04-11 DIAGNOSIS — R195 Other fecal abnormalities: Secondary | ICD-10-CM | POA: Diagnosis not present

## 2017-04-11 DIAGNOSIS — K5520 Angiodysplasia of colon without hemorrhage: Secondary | ICD-10-CM | POA: Diagnosis not present

## 2017-04-11 DIAGNOSIS — R531 Weakness: Secondary | ICD-10-CM | POA: Diagnosis not present

## 2017-04-11 DIAGNOSIS — D649 Anemia, unspecified: Secondary | ICD-10-CM | POA: Diagnosis not present

## 2017-04-11 DIAGNOSIS — I10 Essential (primary) hypertension: Secondary | ICD-10-CM | POA: Diagnosis not present

## 2017-04-11 DIAGNOSIS — K6289 Other specified diseases of anus and rectum: Secondary | ICD-10-CM | POA: Diagnosis not present

## 2017-04-11 DIAGNOSIS — K3189 Other diseases of stomach and duodenum: Secondary | ICD-10-CM | POA: Diagnosis not present

## 2017-04-11 DIAGNOSIS — K31819 Angiodysplasia of stomach and duodenum without bleeding: Secondary | ICD-10-CM | POA: Diagnosis not present

## 2017-04-11 DIAGNOSIS — D508 Other iron deficiency anemias: Secondary | ICD-10-CM | POA: Diagnosis not present

## 2017-04-11 LAB — TYPE AND SCREEN
ABO/RH(D): O POS
Antibody Screen: NEGATIVE
Donor AG Type: NEGATIVE
Donor AG Type: NEGATIVE
UNIT DIVISION: 0
Unit division: 0

## 2017-04-11 LAB — BPAM RBC
BLOOD PRODUCT EXPIRATION DATE: 201808112359
Blood Product Expiration Date: 201808182359
ISSUE DATE / TIME: 201807191301
ISSUE DATE / TIME: 201807191827
Unit Type and Rh: 5100
Unit Type and Rh: 5100

## 2017-04-11 LAB — BASIC METABOLIC PANEL
ANION GAP: 11 (ref 5–15)
BUN: 12 mg/dL (ref 6–20)
CALCIUM: 9.3 mg/dL (ref 8.9–10.3)
CHLORIDE: 106 mmol/L (ref 101–111)
CO2: 23 mmol/L (ref 22–32)
Creatinine, Ser: 0.87 mg/dL (ref 0.44–1.00)
GFR calc non Af Amer: >60 mL/min (ref >60)
Glucose, Bld: 106 mg/dL — ABNORMAL HIGH (ref 65–99)
POTASSIUM: 3.6 mmol/L (ref 3.5–5.1)
Sodium: 140 mmol/L (ref 135–145)

## 2017-04-11 LAB — TROPONIN I: Troponin I: <0.03 ng/mL (ref <0.03)

## 2017-04-11 LAB — CBC
HEMATOCRIT: 35.9 % — AB (ref 36.0–46.0)
HEMOGLOBIN: 11 g/dL — AB (ref 12.0–15.0)
MCH: 24.8 pg — AB (ref 26.0–34.0)
MCHC: 30.1 g/dL (ref 30.0–36.0)
MCV: 82.5 fL (ref 78.0–100.0)
Platelets: 208 10*3/uL (ref 150–400)
RBC: 4.35 MIL/uL (ref 3.87–5.11)
RDW: 15.4 % (ref 11.5–15.5)
WBC: 9.8 10*3/uL (ref 4.0–10.5)

## 2017-04-11 SURGERY — CANCELLED PROCEDURE

## 2017-04-11 MED ORDER — POLYETHYLENE GLYCOL 3350 17 GM/SCOOP PO POWD
1.0000 | Freq: Once | ORAL | Status: AC
  Administered 2017-04-11: 255 g via ORAL
  Filled 2017-04-11 (×2): qty 255

## 2017-04-11 MED ORDER — IPRATROPIUM-ALBUTEROL 0.5-2.5 (3) MG/3ML IN SOLN
3.0000 mL | Freq: Three times a day (TID) | RESPIRATORY_TRACT | Status: DC
  Administered 2017-04-11 – 2017-04-12 (×2): 3 mL via RESPIRATORY_TRACT
  Filled 2017-04-11 (×2): qty 3

## 2017-04-11 MED ORDER — IPRATROPIUM-ALBUTEROL 0.5-2.5 (3) MG/3ML IN SOLN
3.0000 mL | Freq: Four times a day (QID) | RESPIRATORY_TRACT | Status: DC
  Administered 2017-04-11: 3 mL via RESPIRATORY_TRACT
  Filled 2017-04-11: qty 3

## 2017-04-11 MED ORDER — MORPHINE SULFATE (PF) 2 MG/ML IV SOLN
1.0000 mg | INTRAVENOUS | Status: DC | PRN
  Administered 2017-04-11: 2 mg via INTRAVENOUS
  Administered 2017-04-11: 1 mg via INTRAVENOUS
  Administered 2017-04-12: 2 mg via INTRAVENOUS
  Filled 2017-04-11 (×4): qty 1

## 2017-04-11 MED ORDER — ATORVASTATIN CALCIUM 80 MG PO TABS
80.0000 mg | ORAL_TABLET | Freq: Every day | ORAL | Status: DC
  Administered 2017-04-11 – 2017-04-12 (×2): 80 mg via ORAL
  Filled 2017-04-11 (×2): qty 1

## 2017-04-11 NOTE — Anesthesia Preprocedure Evaluation (Signed)
Anesthesia Evaluation  Patient identified by MRN, date of birth, ID band Patient awake    Reviewed: Allergy & Precautions, H&P , NPO status , Patient's Chart, lab work & pertinent test results  Airway Mallampati: II   Neck ROM: full    Dental   Pulmonary shortness of breath, Current Smoker,    breath sounds clear to auscultation       Cardiovascular hypertension, + CAD, + Past MI, + CABG and + Peripheral Vascular Disease  + Valvular Problems/Murmurs  Rhythm:regular Rate:Normal  S/p AVR   Neuro/Psych PSYCHIATRIC DISORDERS Anxiety Depression    GI/Hepatic GERD  ,  Endo/Other    Renal/GU      Musculoskeletal   Abdominal   Peds  Hematology  (+) anemia ,   Anesthesia Other Findings   Reproductive/Obstetrics                             Anesthesia Physical Anesthesia Plan  ASA: III  Anesthesia Plan: MAC   Post-op Pain Management:    Induction: Intravenous  PONV Risk Score and Plan: 1 and Ondansetron, Dexamethasone and Treatment may vary due to age or medical condition  Airway Management Planned: Simple Face Mask  Additional Equipment:   Intra-op Plan:   Post-operative Plan:   Informed Consent: I have reviewed the patients History and Physical, chart, labs and discussed the procedure including the risks, benefits and alternatives for the proposed anesthesia with the patient or authorized representative who has indicated his/her understanding and acceptance.     Plan Discussed with: CRNA and Anesthesiologist  Anesthesia Plan Comments:         Anesthesia Quick Evaluation

## 2017-04-11 NOTE — Progress Notes (Signed)
Patient had no complications after blood transfusion. Post CBC= Hgb 11.

## 2017-04-11 NOTE — Progress Notes (Signed)
      Progress Note   Subjective  Patient drank part but not all of the preparation overnight. Stools would not clear. Enema given today with solid brown stools. Not ready for bowel prep. She is drinking liquids.    Objective   Vital signs in last 24 hours: Temp:  [97.6 F (36.4 C)-98.7 F (37.1 C)] 97.8 F (36.6 C) (07/20 1202) Pulse Rate:  [61-71] 61 (07/20 0602) Resp:  [18-20] 18 (07/20 1202) BP: (106-136)/(51-92) 108/51 (07/20 1202) SpO2:  [95 %-100 %] 98 % (07/20 1202) Weight:  [158 lb 14.4 oz (72.1 kg)-160 lb 14.4 oz (73 kg)] 158 lb 14.4 oz (72.1 kg) (07/20 0602) Last BM Date: 04/11/17 General:    AA female in NAD Heart:  Regular rate and rhythm; no murmurs Lungs: Respirations even and unlabored,  Abdomen:  Soft, nontender and nondistended.  Extremities:  Without edema. Neurologic:  Alert and oriented,  grossly normal neurologically. Psych:  Cooperative. Normal mood and affect.  Intake/Output from previous day: 07/19 0701 - 07/20 0700 In: 2742.5 [P.O.:1780; I.V.:10; Blood:352.5; IV SELTRVUYE:334] Out: 0  Intake/Output this shift: Total I/O In: 0  Out: 301 [Urine:300; Stool:1]  Lab Results:  Recent Labs  04/10/17 1028 04/11/17 0109  WBC 6.4 9.8  HGB 8.2* 11.0*  HCT 28.2* 35.9*  PLT 242 208   BMET  Recent Labs  04/10/17 1028 04/11/17 0109  NA 141 140  K 3.8 3.6  CL 107 106  CO2 24 23  GLUCOSE 108* 106*  BUN 14 12  CREATININE 0.71 0.87  CALCIUM 9.4 9.3   LFT  Recent Labs  04/10/17 1028  PROT 7.3  ALBUMIN 3.5  AST 26  ALT 22  ALKPHOS 87  BILITOT 0.3   PT/INR  Recent Labs  04/10/17 1028  LABPROT 15.2  INR 1.19    Studies/Results: No results found.     Assessment / Plan:   76 y/o female with multiple cormorbidies, using aspirin and mobic, presenting with symptomatic anemia, iron deficient, and heme positive stools. Remote history of AVMs (last EGD in 2016 was normal however) and colonoscopy was a poor prep. We have recommended  upper and lower endoscopy to further evaluate her anemia. Unfortunately she was not able to complete her bowel preparation overnight, states she won't drink any more Moviprep, her procedures were cancelled today. Will order Miralax prep tonight, can continue clear liquids tonight and NPO after MN other than prep. We will plan for her procedures to be done tomorrow. Continue PPI for now. I have discussed the risks / benefits of EGD / colonoscopy with the patient and she wished to proceed. All questions answered.  [place] Cellar, MD [facility] Pager [phone]

## 2017-04-11 NOTE — Progress Notes (Signed)
PROGRESS NOTE    [name]  NWG:[id number] DOB: Aug 13, 1941 DOA: 04/10/2017 PCP: Donald Prose, MD   Brief Narrative: 76 y.o. 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  Assessment & Plan:  #  Normocytic anemia likely due to chronic iron deficiency anemia probably contributed by GI bleed: Received 2 units of red blood cell transfusion with improvement in hemoglobin. Plan for endoscopy evaluation by GI today. Monitor CBC. -continue PPI  # hypertension: Monitor blood pressure. Currently on bystolic. Holding other home medicine. Blood pressure soft this morning  # h/o CAD s/p CABG: Takes aspirin and statin at home.  # tobacco abuse: Counseled. duoneb ordered. Continue other current medical and supportive care.  Active Problems:   Peripheral vascular disease (H[facility])   Iron deficiency anemia, unspecified   Major depressive disorder, single episode, unspecified   Essential hypertension   Coronary artery disease involving coronary bypass graft of native heart without angina pectoris   H/O aortic valve replacement   S/P AVR   Tobacco abuse   History of GI bleed   Symptomatic anemia   Generalized weakness   Heme positive stool  DVT prophylaxis:SCD. No anticoagulation because of possible GI bleed Code Status: Full code Family Communication: Patient does not at bedside Disposition Plan: GI eval is ongoing. Likely discharge home in 1-2 days    Consultants:   GI  Procedures: Endoscopy planned for today Antimicrobials: None  Subjective: Seen and examined at bedside. Has nausea but denied vomiting or abdominal pain. No chest pain. Mild short of breath.  Objective: Vitals:   04/11/17 0056 04/11/17 0602 04/11/17 0846 04/11/17 1202  BP: (!) 108/54 (!) 118/54  (!) 108/51  Pulse: 64 61      Resp: 18 18  18   Temp: 98.6 F (37 C) 98.7 F (37.1 C)  97.8 F (36.6 C)  TempSrc: Oral Oral  Oral  SpO2: 100% 97% 98% 98%  Weight:  72.1 kg (158 lb 14.4 oz)    Height:        Intake/Output Summary (Last 24 hours) at 04/11/17 1454 Last data filed at 04/11/17 1203  Gross per 24 hour  Intake           2142.5 ml  Output              301 ml  Net           1841.5 ml   Filed Weights   04/10/17 1007 04/10/17 1653 04/11/17 0602  Weight: 72.1 kg (159 lb) 73 kg (160 lb 14.4 oz) 72.1 kg (158 lb 14.4 oz)    Examination:  General exam: Appears calm and comfortable  Respiratory system: Clear to auscultation. Respiratory effort normal. No wheezing or crackle Cardiovascular system: S1 & S2 heard, RRR.  No pedal edema. Gastrointestinal system: Abdomen is soft and nontender. Normal bowel sounds heard. Central nervous system: Alert Awake and nonfocal. Extremities: Symmetric 5 x 5 power. Skin: No rashes, lesions or ulcers Psychiatry: Judgement and insight appear normal. Mood & affect appropriate.     Data Reviewed: I have personally reviewed following labs and imaging studies  CBC:  Recent Labs Lab 04/08/17 0841 04/10/17 1028 04/11/17 0109  WBC 6.8 6.4 9.8  HGB 8.4* 8.2* 11.0*  HCT 28.6* 28.2* 35.9*  MCV 79 79.7 82.5  PLT 265 242  888   Basic Metabolic Panel:  Recent Labs Lab 04/08/17 0841 04/10/17 1028 04/11/17 0109  NA 144 141 140  K 4.0 3.8 3.6  CL 104 107 106  CO2 16* 24 23  GLUCOSE 111* 108* 106*  BUN 14 14 12   CREATININE 0.67 0.71 0.87  CALCIUM 9.4 9.4 9.3   GFR: Estimated Creatinine Clearance: 47.6 mL/min (by C-G formula based on SCr of 0.87 mg/dL). Liver Function Tests:  Recent Labs Lab 04/08/17 0841 04/10/17 1028  AST 23 26  ALT 19 22  ALKPHOS 105 87  BILITOT <0.2 0.3  PROT 7.2 7.3  ALBUMIN 3.9 3.5   No results for input(s): LIPASE, AMYLASE in the last 168 hours. No results for input(s): AMMONIA in the last 168 hours. Coagulation  Profile:  Recent Labs Lab 04/10/17 1028  INR 1.19   Cardiac Enzymes:  Recent Labs Lab 04/10/17 1300 04/11/17 0109  TROPONINI <0.03 <0.03   BNP (last 3 results)  Recent Labs  04/08/17 0841  PROBNP 258   HbA1C: No results for input(s): HGBA1C in the last 72 hours. CBG: No results for input(s): GLUCAP in the last 168 hours. Lipid Profile: No results for input(s): CHOL, HDL, LDLCALC, TRIG, CHOLHDL, LDLDIRECT in the last 72 hours. Thyroid Function Tests: No results for input(s): TSH, T4TOTAL, FREET4, T3FREE, THYROIDAB in the last 72 hours. Anemia Panel:  Recent Labs  04/10/17 1028  VITAMINB12 559  FOLATE 15.5  FERRITIN 23  TIBC 459*  IRON 9*  RETICCTPCT 3.9*   Sepsis Labs: No results for input(s): PROCALCITON, LATICACIDVEN in the last 168 hours.  No results found for this or any previous visit (from the past 240 hour(s)).       Radiology Studies: No results found.      Scheduled Meds: . ipratropium-albuterol  3 mL Nebulization Q6H  . nebivolol  5 mg Oral Daily  . pantoprazole (PROTONIX) IV  40 mg Intravenous Q12H  . sertraline  100 mg Oral Daily  . sodium chloride flush  3 mL Intravenous Q12H  . sodium chloride flush  3 mL Intravenous Q12H  . sucralfate  1 g Oral BID  . tiotropium  18 mcg Inhalation q morning - 10a   Continuous Infusions: . sodium chloride       LOS: 0 days    Dron Tanna Furry, MD [facility] Pager [phone]  If 7PM-7AM, please contact night-coverage [email or web address] Password [email or web address] 04/11/2017, 2:54 PM

## 2017-04-11 NOTE — Progress Notes (Signed)
Patient was not able to finish bowel prep, it made her stomach hurt.

## 2017-04-11 NOTE — Progress Notes (Signed)
Pre-procedure check  Unable to complete 2nd dose of Moviprep. She had upper and lower abdominal discomfort, some nausea but no vomiting. She has a little of 1/2 left. Needs to be NPO now for anesthesia purposes (requires minimum of 4 hours). Patient's BMs are not totally clear at this point. Will give 2 tap water enemas stat and evaluate effluent. If clear then will proceed with EGD / colon. Otherwise, will need to continue prepping today and reschedule procedures for tomorrow.

## 2017-04-12 ENCOUNTER — Encounter (HOSPITAL_COMMUNITY): Payer: Self-pay | Admitting: *Deleted

## 2017-04-12 ENCOUNTER — Encounter (HOSPITAL_COMMUNITY)
Admission: EM | Disposition: A | Discharge status: Home / Self Care | Payer: Self-pay | Source: Home / Self Care | Attending: Emergency Medicine

## 2017-04-12 DIAGNOSIS — K3189 Other diseases of stomach and duodenum: Secondary | ICD-10-CM | POA: Diagnosis not present

## 2017-04-12 DIAGNOSIS — K5520 Angiodysplasia of colon without hemorrhage: Secondary | ICD-10-CM

## 2017-04-12 DIAGNOSIS — K31819 Angiodysplasia of stomach and duodenum without bleeding: Secondary | ICD-10-CM

## 2017-04-12 DIAGNOSIS — K259 Gastric ulcer, unspecified as acute or chronic, without hemorrhage or perforation: Secondary | ICD-10-CM

## 2017-04-12 DIAGNOSIS — D649 Anemia, unspecified: Secondary | ICD-10-CM | POA: Diagnosis not present

## 2017-04-12 DIAGNOSIS — I10 Essential (primary) hypertension: Secondary | ICD-10-CM | POA: Diagnosis not present

## 2017-04-12 DIAGNOSIS — K6289 Other specified diseases of anus and rectum: Secondary | ICD-10-CM

## 2017-04-12 DIAGNOSIS — D509 Iron deficiency anemia, unspecified: Secondary | ICD-10-CM | POA: Diagnosis not present

## 2017-04-12 HISTORY — PX: COLONOSCOPY: SHX5424

## 2017-04-12 HISTORY — PX: ESOPHAGOGASTRODUODENOSCOPY: SHX5428

## 2017-04-12 LAB — CBC
HEMATOCRIT: 36.1 % (ref 36.0–46.0)
HEMOGLOBIN: 10.8 g/dL — AB (ref 12.0–15.0)
MCH: 24.5 pg — AB (ref 26.0–34.0)
MCHC: 29.9 g/dL — ABNORMAL LOW (ref 30.0–36.0)
MCV: 81.9 fL (ref 78.0–100.0)
Platelets: 193 10*3/uL (ref 150–400)
RBC: 4.41 MIL/uL (ref 3.87–5.11)
RDW: 15.9 % — ABNORMAL HIGH (ref 11.5–15.5)
WBC: 9.6 10*3/uL (ref 4.0–10.5)

## 2017-04-12 SURGERY — EGD (ESOPHAGOGASTRODUODENOSCOPY)
Anesthesia: Moderate Sedation

## 2017-04-12 MED ORDER — FENTANYL CITRATE (PF) 100 MCG/2ML IJ SOLN
INTRAMUSCULAR | Status: AC
  Filled 2017-04-12: qty 4

## 2017-04-12 MED ORDER — SODIUM CHLORIDE 0.9 % IV SOLN
INTRAVENOUS | Status: DC
  Administered 2017-04-12: 14:00:00 via INTRAVENOUS

## 2017-04-12 MED ORDER — MIDAZOLAM HCL 5 MG/ML IJ SOLN
INTRAMUSCULAR | Status: AC
  Filled 2017-04-12: qty 3

## 2017-04-12 MED ORDER — ACETAMINOPHEN 325 MG PO TABS: 650.0000 mg | ORAL_TABLET | Freq: Four times a day (QID) | ORAL | Status: AC | PRN

## 2017-04-12 MED ORDER — FENTANYL CITRATE (PF) 100 MCG/2ML IJ SOLN
INTRAMUSCULAR | Status: DC | PRN
  Administered 2017-04-12: 50 ug via INTRAVENOUS

## 2017-04-12 MED ORDER — MIDAZOLAM HCL 10 MG/2ML IJ SOLN
INTRAMUSCULAR | Status: DC | PRN
  Administered 2017-04-12: 2 mg via INTRAVENOUS
  Administered 2017-04-12 (×2): 1 mg via INTRAVENOUS

## 2017-04-12 MED ORDER — OMEPRAZOLE 40 MG PO CPDR: 40.0000 mg | DELAYED_RELEASE_CAPSULE | Freq: Two times a day (BID) | ORAL | 0 refills | Status: AC

## 2017-04-12 MED ORDER — IPRATROPIUM-ALBUTEROL 0.5-2.5 (3) MG/3ML IN SOLN: 3.0000 mL | RESPIRATORY_TRACT | Status: DC | PRN

## 2017-04-12 MED ORDER — FERROUS SULFATE 325 (65 FE) MG PO TABS: 325.0000 mg | ORAL_TABLET | Freq: Two times a day (BID) | ORAL | 0 refills | Status: DC

## 2017-04-12 MED ORDER — PANTOPRAZOLE SODIUM 40 MG PO TBEC: 40.0000 mg | DELAYED_RELEASE_TABLET | Freq: Two times a day (BID) | ORAL | Status: DC

## 2017-04-12 MED ORDER — BUTAMBEN-TETRACAINE-BENZOCAINE 2-2-14 % EX AERO
INHALATION_SPRAY | CUTANEOUS | Status: DC | PRN
  Administered 2017-04-12: 1 via TOPICAL

## 2017-04-12 NOTE — Discharge Summary (Signed)
Physician Discharge Summary  [name] NWG:[id number] DOB: 07/01/1941 DOA: 04/10/2017  PCP: Donald Prose, MD  Admit date: 04/10/2017 Discharge date: 04/12/2017  Admitted From:home Disposition:home  Recommendations for Outpatient Follow-up:  1. Follow up with PCP in 1-2 weeks 2. Please obtain BMP/CBC in one week 3. Follow up with GI for the biopsy result   Home Health:no Equipment/Devices:no Discharge Condition:stable CODE STATUS:full Diet recommendation:heart healthy  Brief/Interim Summary: 76 y.o.femalewith hx of Aortic stenosis, CAD s/p CABG x 2, AVR with bioprosthetic valve 2010, htn, hyperlipidemia, hx GIB in setting of gastric AVMs 2008, PAD, GERD, current tobacco abuse sent to ED by PCP office with symptomatic anemia.  #  Normocytic anemia likely due to chronic iron deficiency anemia probably contributed by subacute upper GI bleed: Received 2 units of red blood cell transfusion with improvement in hemoglobin.  -s/p upper GI egd with non bleeding gastric ulcer and AVF which might be the source of bleeding contributed by NSAIDs use. Recommended to avoid NSAIDs. Ok for aspirin. -continue PPI Bid and outpatient follow up with PCP and GI.  # hypertension: resume home meds. Recommended to monitor BP.  # h/o CAD s/p CABG: continue aspirin and statin   # tobacco abuse: Counseled. Bronchodilators. Medically stable on discharge.  Discharge Diagnoses:  Active Problems:   Peripheral vascular disease (HCC)   Iron deficiency anemia, unspecified   Major depressive disorder, single episode, unspecified   Essential hypertension   Coronary artery disease involving coronary bypass graft of native heart without angina pectoris   H/O aortic valve replacement   S/P AVR   Tobacco abuse   History of GI bleed   Symptomatic anemia   Generalized weakness   Heme positive stool    Discharge Instructions  Discharge Instructions    Call MD for:  difficulty breathing, headache or  visual disturbances    Complete by:  As directed    Call MD for:  extreme fatigue    Complete by:  As directed    Call MD for:  hives    Complete by:  As directed    Call MD for:  persistant dizziness or light-headedness    Complete by:  As directed    Call MD for:  persistant nausea and vomiting    Complete by:  As directed    Call MD for:  severe uncontrolled pain    Complete by:  As directed    Call MD for:  temperature >100.4    Complete by:  As directed    Diet - low sodium heart healthy    Complete by:  As directed    Discharge instructions    Complete by:  As directed    Please check your BP at home daily and follow up with your PCP.   Increase activity slowly    Complete by:  As directed      Allergies as of 04/12/2017      Reactions   Codeine Other (See Comments)   Feeling intoxicated      Medication List    STOP taking these medications   meloxicam 7.5 MG tablet Commonly known as:  MOBIC     TAKE these medications   acetaminophen 325 MG tablet Commonly known as:  TYLENOL Take 2 tablets (650 mg total) by mouth every 6 (six) hours as needed for mild pain (or Fever >/= 101).   amLODipine 10 MG tablet Commonly known as:  NORVASC TAKE 1 TABLET BY MOUTH EVERY DAY   aspirin 81  MG tablet Take 81 mg by mouth daily.   atorvastatin 80 MG tablet Commonly known as:  LIPITOR TAKE 1 TABLET BY MOUTH EVERY DAY   BYSTOLIC 5 MG tablet Generic drug:  nebivolol TAKE 1 TABLET BY MOUTH EVERY DAY   CALCIUM 600+D 600-400 MG-UNIT tablet Generic drug:  Calcium Carbonate-Vitamin D Take 1 tablet by mouth daily.   ferrous sulfate 325 (65 FE) MG tablet Take 1 tablet (325 mg total) by mouth 2 (two) times daily with a meal. What changed:  when to take this   furosemide 20 MG tablet Commonly known as:  LASIX TAKE 1 TABLET BY MOUTH EVERY DAY AS NEEDED What changed:  See the new instructions.   glycopyrrolate 2 MG tablet Commonly known as:  ROBINUL Take 2 mg by mouth 2  (two) times daily.   lisinopril-hydrochlorothiazide 20-25 MG tablet Commonly known as:  PRINZIDE,ZESTORETIC Take 1/2 tablet (10-12.5mg ) by mouth two times a day   LORazepam 0.5 MG tablet Commonly known as:  ATIVAN Take 0.5 mg by mouth at bedtime as needed for anxiety or sleep.   multivitamin tablet Take 1 tablet by mouth daily.   omeprazole 40 MG capsule Commonly known as:  PRILOSEC Take 1 capsule (40 mg total) by mouth 2 (two) times daily. What changed:  when to take this   potassium chloride 10 MEQ CR capsule Commonly known as:  MICRO-K TAKE 2 CAPSULES BY MOUTH TWICE A DAY   sertraline 100 MG tablet Commonly known as:  ZOLOFT TAKE 2 TABLETS (200MG ) BY MOUTH ONCE A DAY   sucralfate 1 g tablet Commonly known as:  CARAFATE TAKE 1 TABLET BY MOUTH 4 TIMES DAILY - WITH MEALS AND AT BEDTIME. What changed:  See the new instructions.   tiotropium 18 MCG inhalation capsule Commonly known as:  SPIRIVA HANDIHALER Place 1 capsule (18 mcg total) into inhaler and inhale every morning.   traMADol 50 MG tablet Commonly known as:  ULTRAM TAKE 1 TABLET BY MOUTH EVERY 6 HOURS AS NEEDED FOR MODERATE PAIN      Follow-up Information    Donald Prose, MD. Schedule an appointment as soon as possible for a visit in 1 week(s).   Specialty:  Family Medicine Contact information: [address] [place] [address] [phone]        Manus Gunning, MD. Schedule an appointment as soon as possible for a visit in 2 week(s).   Specialty:  Gastroenterology Why:  follow up biopsy result Contact information: [address] [place] [address] [phone]          Allergies  Allergen Reactions  . Codeine Other (See Comments)    Feeling intoxicated    Consultations: Gi  Procedures/Studies: Upper EGD  Subjective: Seen and examined at bedside. No nausea, vomiting, abdomen pain or Gi bleed. No chest pain, SOB.  Discharge Exam: Vitals:    04/12/17 1605 04/12/17 1632  BP: (!) 123/58 115/65  Pulse: 71   Resp: (!) 22 20  Temp:  98.4 F (36.9 C)   Vitals:   04/12/17 1550 04/12/17 1555 04/12/17 1605 04/12/17 1632  BP: (!) 122/37 (!) 105/44 (!) 123/58 115/65  Pulse: 73 69 71   Resp: (!) 23 (!) 25 (!) 22 20  Temp:    98.4 F (36.9 C)  TempSrc:      SpO2: 100% 98% 95% 95%  Weight:      Height:        General: Pt is alert,  awake, not in acute distress Cardiovascular: RRR, S1/S2 +, no rubs, no gallops Respiratory: CTA bilaterally, no wheezing, no rhonchi Abdominal: Soft, NT, ND, bowel sounds + Extremities: no edema, no cyanosis    The results of significant diagnostics from this hospitalization (including imaging, microbiology, ancillary and laboratory) are listed below for reference.     Microbiology: No results found for this or any previous visit (from the past 240 hour(s)).   Labs: BNP (last 3 results) No results for input(s): BNP in the last 8760 hours. Basic Metabolic Panel:  Recent Labs Lab 04/08/17 0841 04/10/17 1028 04/11/17 0109  NA 144 141 140  K 4.0 3.8 3.6  CL 104 107 106  CO2 16* 24 23  GLUCOSE 111* 108* 106*  BUN 14 14 12   CREATININE 0.67 0.71 0.87  CALCIUM 9.4 9.4 9.3   Liver Function Tests:  Recent Labs Lab 04/08/17 0841 04/10/17 1028  AST 23 26  ALT 19 22  ALKPHOS 105 87  BILITOT <0.2 0.3  PROT 7.2 7.3  ALBUMIN 3.9 3.5   No results for input(s): LIPASE, AMYLASE in the last 168 hours. No results for input(s): AMMONIA in the last 168 hours. CBC:  Recent Labs Lab 04/08/17 0841 04/10/17 1028 04/11/17 0109 04/12/17 0343  WBC 6.8 6.4 9.8 9.6  HGB 8.4* 8.2* 11.0* 10.8*  HCT 28.6* 28.2* 35.9* 36.1  MCV 79 79.7 82.5 81.9  PLT 265 242 208 193   Cardiac Enzymes:  Recent Labs Lab 04/10/17 1300 04/11/17 0109  TROPONINI <0.03 <0.03   BNP: Invalid input(s): POCBNP CBG: No results for input(s): GLUCAP in the last 168 hours. D-Dimer No results for input(s): DDIMER  in the last 72 hours. Hgb A1c No results for input(s): HGBA1C in the last 72 hours. Lipid Profile No results for input(s): CHOL, HDL, LDLCALC, TRIG, CHOLHDL, LDLDIRECT in the last 72 hours. Thyroid function studies No results for input(s): TSH, T4TOTAL, T3FREE, THYROIDAB in the last 72 hours.  Invalid input(s): FREET3 Anemia work up  Recent Labs  04/10/17 1028  VITAMINB12 559  FOLATE 15.5  FERRITIN 23  TIBC 459*  IRON 9*  RETICCTPCT 3.9*   Urinalysis    Component Value Date/Time   COLORURINE YELLOW 04/10/2017 1210   APPEARANCEUR HAZY (A) 04/10/2017 1210   LABSPEC 1.016 04/10/2017 1210   PHURINE 5.0 04/10/2017 1210   GLUCOSEU NEGATIVE 04/10/2017 1210   HGBUR NEGATIVE 04/10/2017 1210   BILIRUBINUR NEGATIVE 04/10/2017 1210   KETONESUR NEGATIVE 04/10/2017 1210   PROTEINUR NEGATIVE 04/10/2017 1210   UROBILINOGEN 0.2 12/27/2008 1056   NITRITE POSITIVE (A) 04/10/2017 1210   LEUKOCYTESUR LARGE (A) 04/10/2017 1210   Sepsis Labs Invalid input(s): PROCALCITONIN,  WBC,  LACTICIDVEN Microbiology No results found for this or any previous visit (from the past 240 hour(s)).   Time coordinating discharge: 28 minutes  SIGNED:   Rosita Fire, MD  [facility] 04/12/2017, 4:46 PM  If 7PM-7AM, please contact night-coverage [email or web address] Password [email or web address]

## 2017-04-12 NOTE — Op Note (Signed)
[facility] Patient Name: [name] Procedure Date : 04/12/2017 MRN: [id number] Attending MD: Carlota Raspberry. Jaima Janney MD, MD Date of Birth: 1940-12-11 CSN: [id number] Age: 76 Admit Type: Inpatient Procedure:                Colonoscopy Indications:              Heme positive stool, Iron deficiency anemia Providers:                Remo Lipps P. Jaleah Lefevre MD, MD, Vista Lawman, RN,                            Elspeth Cho Tech., Technician Referring MD:              Medicines:                None (sedated from EGD - no additional medications                            given during this case) Complications:            No immediate complications. Estimated blood loss:                            Minimal. Estimated Blood Loss:     Estimated blood loss was minimal. Procedure:                Pre-Anesthesia Assessment:                           - Prior to the procedure, a History and Physical                            was performed, and patient medications and                            allergies were reviewed. The patient's tolerance of                            previous anesthesia was also reviewed. The risks                            and benefits of the procedure and the sedation                            options and risks were discussed with the patient.                            All questions were answered, and informed consent                            was obtained. Prior Anticoagulants: The patient has                            taken no previous anticoagulant or antiplatelet  agents. ASA Grade Assessment: III - A patient with                            severe systemic disease. After reviewing the risks                            and benefits, the patient was deemed in                            satisfactory condition to undergo the procedure.                           After obtaining informed consent, the colonoscope     was passed under direct vision. Throughout the                            procedure, the patient's blood pressure, pulse, and                            oxygen saturations were monitored continuously. The                            Colonoscope was introduced through the anus and                            advanced to the the terminal ileum, with                            identification of the appendiceal orifice and IC                            valve. The colonoscopy was performed without                            difficulty. The patient tolerated the procedure                            well. The quality of the bowel preparation was                            fair. The terminal ileum, ileocecal valve,                            appendiceal orifice, and rectum were photographed. Scope In: 3:30:38 PM Scope Out: 3:42:30 PM Scope Withdrawal Time: 0 hours 9 minutes 44 seconds  Total Procedure Duration: 0 hours 11 minutes 52 seconds  Findings:      The perianal and digital rectal examinations were normal.      The terminal ileum appeared normal.      A single small angiodysplastic lesion was found in the cecum, it was       friable to contact. Fulguration to ablate the lesion by argon plasma was       successful.      Anal papilla(e) were hypertrophied.      The exam was  otherwise without abnormality. Prep was fair - no large       polyps or mass lesions noted. Impression:               - Preparation of the colon was fair.                           - The examined portion of the ileum was normal.                           - A single colonic angiodysplastic lesion in the                            cecum. Treated with argon plasma coagulation (APC).                           - Anal papilla(e) were hypertrophied.                           - The examination was otherwise normal.                           - No polyps noted, very small or flat polyps may                            not have  been well appreciated given the prep. Moderate Sedation:      Moderate (conscious) sedation was administered by the endoscopy nurse       and supervised by the endoscopist. The following parameters were       monitored: oxygen saturation, heart rate, blood pressure, and response       to care. Total physician intraservice time was 12 minutes. Recommendation:           - Return patient to hospital ward for ongoing care.                           - Resume previous diet.                           - Continue present medications.                           - Recommendations per EGD note Procedure Code(s):        --- Professional ---                           [phone], Colonoscopy, flexible; with ablation of                            tumor(s), polyp(s), or other lesion(s) (includes                            pre- and post-dilation and guide wire passage, when                            performed)  70017, Moderate sedation services provided by the                            same physician or other qualified health care                            professional performing the diagnostic or                            therapeutic service that the sedation supports,                            requiring the presence of an independent trained                            observer to assist in the monitoring of the                            patient's level of consciousness and physiological                            status; initial 15 minutes of intraservice time,                            patient age 73 years or older Diagnosis Code(s):        --- Professional ---                           K55.20, Angiodysplasia of colon without hemorrhage                           K62.89, Other specified diseases of anus and rectum                           R19.5, Other fecal abnormalities                           D50.9, Iron deficiency anemia, unspecified CPT copyright 2016 American Medical  Association. All rights reserved. The codes documented in this report are preliminary and upon coder review may  be revised to meet current compliance requirements. Remo Lipps P. Aurelie Dicenzo MD, MD 04/12/2017 3:53:33 PM This report has been signed electronically. Number of Addenda: 0

## 2017-04-12 NOTE — Progress Notes (Signed)
PROGRESS NOTE    [name]  GMW:[id number] DOB: 1941-08-08 DOA: 04/10/2017 PCP: Donald Prose, MD   Brief Narrative: 76 y.o. 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  Assessment & Plan:  #  Normocytic anemia likely due to chronic iron deficiency anemia probably contributed by GI bleed: Received 2 units of red blood cell transfusion with improvement in hemoglobin.  -Continue Protonix. Patient is waiting for endoscopy. GI is following. Unable to do endoscopy yesterday because of poor bowel preparation. Patient is currently nothing by mouth.  # hypertension: Blood pressure acceptable. Monitor blood pressure. Currently on bystolic. Holding other home medicine. Blood pressure on lower side.  # h/o CAD s/p CABG: Takes aspirin and statin at home.  # tobacco abuse: Counseled. duoneb ordered. Continue other current medical and supportive care.  Active Problems:   Peripheral vascular disease (HCC)   Iron deficiency anemia, unspecified   Major depressive disorder, single episode, unspecified   Essential hypertension   Coronary artery disease involving coronary bypass graft of native heart without angina pectoris   H/O aortic valve replacement   S/P AVR   Tobacco abuse   History of GI bleed   Symptomatic anemia   Generalized weakness   Heme positive stool  DVT prophylaxis:SCD. No anticoagulation because of possible GI bleed Code Status: Full code Family Communication: Patient does not at bedside Disposition Plan: GI eval is ongoing. Likely discharge home in 1-2 days when GI workup completes.    Consultants:   GI  Procedures: Endoscopy planned for today Antimicrobials: None  Subjective: Seen and examined at bedside. No nausea vomiting, chest pain or dizziness. Reported mild shortness of  breath but improving with nebulization.  Objective: Vitals:   04/11/17 2109 04/12/17 0530 04/12/17 0802 04/12/17 0807  BP: 114/66 115/65    Pulse: 72 70    Resp: 18 18    Temp: 98.4 F (36.9 C) 98.7 F (37.1 C)    TempSrc: Oral Oral    SpO2: 100% 97% 98% 98%  Weight:  71.7 kg (158 lb)    Height:        Intake/Output Summary (Last 24 hours) at 04/12/17 1231 Last data filed at 04/12/17 0600  Gross per 24 hour  Intake             1920 ml  Output              200 ml  Net             1720 ml   Filed Weights   04/10/17 1653 04/11/17 0602 04/12/17 0530  Weight: 73 kg (160 lb 14.4 oz) 72.1 kg (158 lb 14.4 oz) 71.7 kg (158 lb)    Examination:  General exam: Not in distress Respiratory system: Clear bilaterally, respiratory effort normal, no wheezing Cardiovascular system: Regular rate rhythm S1-S2 normal. No pedal edema. Gastrointestinal system: Abdomen soft, nontender nondistended. Bowel sound positive. Central nervous system: Alert and awake and nonfocal. Extremities: Symmetric 5 x 5 power. Skin: No rashes, lesions or ulcers Psychiatry: Judgement and insight appear normal. Mood & affect appropriate.     Data Reviewed: I have personally reviewed following labs and imaging studies  CBC:  Recent Labs Lab 04/08/17 0841 04/10/17 1028 04/11/17 0109 04/12/17 0343  WBC 6.8 6.4 9.8 9.6  HGB 8.4* 8.2*  11.0* 10.8*  HCT 28.6* 28.2* 35.9* 36.1  MCV 79 79.7 82.5 81.9  PLT 265 242 208 828   Basic Metabolic Panel:  Recent Labs Lab 04/08/17 0841 04/10/17 1028 04/11/17 0109  NA 144 141 140  K 4.0 3.8 3.6  CL 104 107 106  CO2 16* 24 23  GLUCOSE 111* 108* 106*  BUN 14 14 12   CREATININE 0.67 0.71 0.87  CALCIUM 9.4 9.4 9.3   GFR: Estimated Creatinine Clearance: 47.4 mL/min (by C-G formula based on SCr of 0.87 mg/dL). Liver Function Tests:  Recent Labs Lab 04/08/17 0841 04/10/17 1028  AST 23 26  ALT 19 22  ALKPHOS 105 87  BILITOT <0.2 0.3  PROT 7.2 7.3    ALBUMIN 3.9 3.5   No results for input(s): LIPASE, AMYLASE in the last 168 hours. No results for input(s): AMMONIA in the last 168 hours. Coagulation Profile:  Recent Labs Lab 04/10/17 1028  INR 1.19   Cardiac Enzymes:  Recent Labs Lab 04/10/17 1300 04/11/17 0109  TROPONINI <0.03 <0.03   BNP (last 3 results)  Recent Labs  04/08/17 0841  PROBNP 258   HbA1C: No results for input(s): HGBA1C in the last 72 hours. CBG: No results for input(s): GLUCAP in the last 168 hours. Lipid Profile: No results for input(s): CHOL, HDL, LDLCALC, TRIG, CHOLHDL, LDLDIRECT in the last 72 hours. Thyroid Function Tests: No results for input(s): TSH, T4TOTAL, FREET4, T3FREE, THYROIDAB in the last 72 hours. Anemia Panel:  Recent Labs  04/10/17 1028  VITAMINB12 559  FOLATE 15.5  FERRITIN 23  TIBC 459*  IRON 9*  RETICCTPCT 3.9*   Sepsis Labs: No results for input(s): PROCALCITON, LATICACIDVEN in the last 168 hours.  No results found for this or any previous visit (from the past 240 hour(s)).       Radiology Studies: No results found.      Scheduled Meds: . atorvastatin  80 mg Oral q1800  . nebivolol  5 mg Oral Daily  . pantoprazole (PROTONIX) IV  40 mg Intravenous Q12H  . sertraline  100 mg Oral Daily  . sodium chloride flush  3 mL Intravenous Q12H  . sodium chloride flush  3 mL Intravenous Q12H  . sucralfate  1 g Oral BID  . tiotropium  18 mcg Inhalation q morning - 10a   Continuous Infusions: . sodium chloride       LOS: 0 days    Tya Haughey Tanna Furry, MD [facility] Pager [phone]  If 7PM-7AM, please contact night-coverage [email or web address] Password [facility] 04/12/2017, 12:31 PM

## 2017-04-12 NOTE — Op Note (Signed)
[facility] Patient Name: [name] Procedure Date : 04/12/2017 MRN: [id number] Attending MD: Carlota Raspberry. Armbruster MD, MD Date of Birth: July 14, 1941 CSN: [id number] Age: 76 Admit Type: Inpatient Procedure:                Upper GI endoscopy Indications:              Iron deficiency anemia, Heme positive stool Providers:                Remo Lipps P. Armbruster MD, MD, Vista Lawman, RN,                            Elspeth Cho Tech., Technician Referring MD:              Medicines:                Fentanyl 50 micrograms IV, Midazolam 4 mg IV Complications:            No immediate complications. Estimated blood loss:                            Minimal. Estimated Blood Loss:     Estimated blood loss was minimal. Procedure:                Pre-Anesthesia Assessment:                           - Prior to the procedure, a History and Physical                            was performed, and patient medications and                            allergies were reviewed. The patient's tolerance of                            previous anesthesia was also reviewed. The risks                            and benefits of the procedure and the sedation                            options and risks were discussed with the patient.                            All questions were answered, and informed consent                            was obtained. Prior Anticoagulants: The patient has                            taken no previous anticoagulant or antiplatelet                            agents. ASA Grade Assessment: III - A patient with  severe systemic disease. After reviewing the risks                            and benefits, the patient was deemed in                            satisfactory condition to undergo the procedure.                           After obtaining informed consent, the endoscope was                            passed under direct vision. Throughout the                           procedure, the patient's blood pressure, pulse, and                            oxygen saturations were monitored continuously. The                            EG-[id number] ([id number]) scope was introduced through the                            mouth, and advanced to the third part of duodenum.                            The upper GI endoscopy was accomplished without                            difficulty. The patient tolerated the procedure                            well. Scope In: Scope Out: Findings:      Esophagogastric landmarks were identified: the Z-line was found at 35       cm, the gastroesophageal junction was found at 35 cm and the upper       extent of the gastric folds was found at 35 cm from the incisors.      The exam of the esophagus was otherwise normal.      Two non-bleeding superficial gastric ulcers with no stigmata of bleeding       were found in the gastric antrum. The largest lesion was 2 mm in largest       dimension.      The exam of the stomach was otherwise normal.      Biopsies were taken with a cold forceps in the gastric body, at the       incisura and in the gastric antrum for Helicobacter pylori testing.      A single 3 mm angiodysplastic lesion was found in the third portion of       the duodenum. Fulguration to ablate the lesion to prevent bleeding by       argon plasma was successful.      The exam of the duodenum was otherwise normal. Impression:               -  Esophagogastric landmarks identified.                           - Normal esophagus                           - 2 diminutive non-bleeding gastric ulcers with no                            stigmata of bleeding.                           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                            Treated with argon plasma coagulation (APC).                           - Biopsies were taken with a cold forceps for                            Helicobacter pylori  testing.                           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 Moderate Sedation:      Moderate (conscious) sedation was administered by the endoscopy nurse       and supervised by the endoscopist. The following parameters were       monitored: oxygen saturation, heart rate, blood pressure, and response       to care. Total physician intraservice time was 12 minutes. Recommendation:           - Return patient to hospital ward for ongoing care.                           - Resume previous diet.                           - Continue present medications.                           - Omeprazole 40mg  twice daily at time of discharge                           - Add oral ferrous sulfate 325mg  BID                           - okay to use aspirin, no other NSAIDS                           - Await pathology results.                           - Okay to  discharge from GI perspective, we will                            coordinate outpatient follow up for her Procedure Code(s):        --- Professional ---                           01093, 59, Esophagogastroduodenoscopy, flexible,                            transoral; with control of bleeding, any method                           43239, Esophagogastroduodenoscopy, flexible,                            transoral; with biopsy, single or multiple                           99152, Moderate sedation services provided by the                            same physician or other qualified health care                            professional performing the diagnostic or                            therapeutic service that the sedation supports,                            requiring the presence of an independent trained                            observer to assist in the monitoring of the                            patient's level of consciousness and physiological                             status; initial 15 minutes of intraservice time,                            patient age 29 years or older Diagnosis Code(s):        --- Professional ---                           K25.9, Gastric ulcer, unspecified as acute or                            chronic, without hemorrhage or perforation                           K31.819, Angiodysplasia of stomach and duodenum  without bleeding                           D50.9, Iron deficiency anemia, unspecified                           R19.5, Other fecal abnormalities CPT copyright 2016 American Medical Association. All rights reserved. The codes documented in this report are preliminary and upon coder review may  be revised to meet current compliance requirements. Remo Lipps P. Armbruster MD, MD 04/12/2017 3:59:47 PM This report has been signed electronically. Number of Addenda: 0

## 2017-04-12 NOTE — H&P (View-Only) (Signed)
      Progress Note   Subjective  Patient drank part but not all of the preparation overnight. Stools would not clear. Enema given today with solid brown stools. Not ready for bowel prep. She is drinking liquids.    Objective   Vital signs in last 24 hours: Temp:  [97.6 F (36.4 C)-98.7 F (37.1 C)] 97.8 F (36.6 C) (07/20 1202) Pulse Rate:  [61-71] 61 (07/20 0602) Resp:  [18-20] 18 (07/20 1202) BP: (106-136)/(51-92) 108/51 (07/20 1202) SpO2:  [95 %-100 %] 98 % (07/20 1202) Weight:  [158 lb 14.4 oz (72.1 kg)-160 lb 14.4 oz (73 kg)] 158 lb 14.4 oz (72.1 kg) (07/20 0602) Last BM Date: 04/11/17 General:    AA female in NAD Heart:  Regular rate and rhythm; no murmurs Lungs: Respirations even and unlabored,  Abdomen:  Soft, nontender and nondistended.  Extremities:  Without edema. Neurologic:  Alert and oriented,  grossly normal neurologically. Psych:  Cooperative. Normal mood and affect.  Intake/Output from previous day: 07/19 0701 - 07/20 0700 In: 2742.5 [P.O.:1780; I.V.:10; Blood:352.5; IV XBWIOMBTD:974] Out: 0  Intake/Output this shift: Total I/O In: 0  Out: 301 [Urine:300; Stool:1]  Lab Results:  Recent Labs  04/10/17 1028 04/11/17 0109  WBC 6.4 9.8  HGB 8.2* 11.0*  HCT 28.2* 35.9*  PLT 242 208   BMET  Recent Labs  04/10/17 1028 04/11/17 0109  NA 141 140  K 3.8 3.6  CL 107 106  CO2 24 23  GLUCOSE 108* 106*  BUN 14 12  CREATININE 0.71 0.87  CALCIUM 9.4 9.3   LFT  Recent Labs  04/10/17 1028  PROT 7.3  ALBUMIN 3.5  AST 26  ALT 22  ALKPHOS 87  BILITOT 0.3   PT/INR  Recent Labs  04/10/17 1028  LABPROT 15.2  INR 1.19    Studies/Results: No results found.     Assessment / Plan:   76 y/o female with multiple cormorbidies, using aspirin and mobic, presenting with symptomatic anemia, iron deficient, and heme positive stools. Remote history of AVMs (last EGD in 2016 was normal however) and colonoscopy was a poor prep. We have recommended  upper and lower endoscopy to further evaluate her anemia. Unfortunately she was not able to complete her bowel preparation overnight, states she won't drink any more Moviprep, her procedures were cancelled today. Will order Miralax prep tonight, can continue clear liquids tonight and NPO after MN other than prep. We will plan for her procedures to be done tomorrow. Continue PPI for now. I have discussed the risks / benefits of EGD / colonoscopy with the patient and she wished to proceed. All questions answered.  [place] Cellar, MD [facility] Pager [phone]

## 2017-04-12 NOTE — Interval H&P Note (Signed)
History and Physical Interval Note:  04/12/2017 3:07 PM  [name]  has presented today for surgery, with the diagnosis of anemia  The various methods of treatment have been discussed with the patient and family. After consideration of risks, benefits and other options for treatment, the patient has consented to  Procedure(s): ESOPHAGOGASTRODUODENOSCOPY (EGD) (N/A) COLONOSCOPY (N/A) as a surgical intervention .  The patient's history has been reviewed, patient examined, no change in status, stable for surgery.  I have reviewed the patient's chart and labs.  Questions were answered to the patient's satisfaction.     Renelda Loma [name]

## 2017-04-13 ENCOUNTER — Other Ambulatory Visit (INDEPENDENT_AMBULATORY_CARE_PROVIDER_SITE_OTHER): Payer: Self-pay | Admitting: Specialist

## 2017-04-13 DIAGNOSIS — M4316 Spondylolisthesis, lumbar region: Secondary | ICD-10-CM

## 2017-04-13 DIAGNOSIS — R202 Paresthesia of skin: Secondary | ICD-10-CM

## 2017-04-13 DIAGNOSIS — R200 Anesthesia of skin: Secondary | ICD-10-CM

## 2017-04-13 DIAGNOSIS — M5441 Lumbago with sciatica, right side: Secondary | ICD-10-CM

## 2017-04-14 ENCOUNTER — Telehealth: Payer: Self-pay | Admitting: Pulmonary Disease

## 2017-04-14 ENCOUNTER — Encounter (HOSPITAL_COMMUNITY): Payer: Self-pay | Admitting: Gastroenterology

## 2017-04-14 NOTE — Telephone Encounter (Signed)
Called Mrs. Flippin to update on CXR results.  Message left on voice mail.  Await PFT's.  Scheduled 7/25.    Noe Gens, NP-C [facility] Pgr: [phone] or if no answer [phone] 04/14/2017, 4:03 PM

## 2017-04-14 NOTE — Telephone Encounter (Signed)
Tramadol refill request 

## 2017-04-15 ENCOUNTER — Telehealth: Payer: Self-pay | Admitting: Gastroenterology

## 2017-04-15 NOTE — Telephone Encounter (Signed)
Called to [facility] on [address]

## 2017-04-15 NOTE — Telephone Encounter (Signed)
The pt advised that we will call her as soon as pathology result received

## 2017-04-16 ENCOUNTER — Ambulatory Visit (INDEPENDENT_AMBULATORY_CARE_PROVIDER_SITE_OTHER): Payer: Medicare Other | Admitting: Pulmonary Disease

## 2017-04-16 DIAGNOSIS — I2721 Secondary pulmonary arterial hypertension: Secondary | ICD-10-CM

## 2017-04-16 DIAGNOSIS — Z720 Tobacco use: Secondary | ICD-10-CM

## 2017-04-16 LAB — PULMONARY FUNCTION TEST
FEF 25-75 POST: 0.97 L/s
FEF 25-75 Pre: 0.99 L/sec
FEF2575-%Change-Post: -2 %
FEF2575-%PRED-PRE: 84 %
FEF2575-%Pred-Post: 82 %
FEV1-%Change-Post: 0 %
FEV1-%PRED-PRE: 69 %
FEV1-%Pred-Post: 69 %
FEV1-POST: 0.9 L
FEV1-Pre: 0.9 L
FEV1FVC-%CHANGE-POST: 3 %
FEV1FVC-%PRED-PRE: 107 %
FEV6-%CHANGE-POST: -3 %
FEV6-%PRED-PRE: 68 %
FEV6-%Pred-Post: 65 %
FEV6-Post: 1.06 L
FEV6-Pre: 1.1 L
FEV6FVC-%PRED-POST: 105 %
FEV6FVC-%Pred-Pre: 105 %
FVC-%Change-Post: -3 %
FVC-%Pred-Post: 62 %
FVC-%Pred-Pre: 64 %
FVC-POST: 1.06 L
FVC-Pre: 1.1 L
POST FEV6/FVC RATIO: 100 %
PRE FEV1/FVC RATIO: 82 %
Post FEV1/FVC ratio: 85 %
Pre FEV6/FVC Ratio: 100 %

## 2017-04-16 NOTE — Progress Notes (Signed)
PFT done today. 

## 2017-04-17 ENCOUNTER — Other Ambulatory Visit: Payer: Self-pay

## 2017-04-17 DIAGNOSIS — D649 Anemia, unspecified: Secondary | ICD-10-CM

## 2017-04-19 NOTE — Progress Notes (Signed)
Late entry for coding clarification:  Patient has history of hyperlipidemia. Exact type unspecified.

## 2017-05-10 ENCOUNTER — Other Ambulatory Visit (INDEPENDENT_AMBULATORY_CARE_PROVIDER_SITE_OTHER): Payer: Self-pay | Admitting: Orthopaedic Surgery

## 2017-05-11 ENCOUNTER — Other Ambulatory Visit (INDEPENDENT_AMBULATORY_CARE_PROVIDER_SITE_OTHER): Payer: Self-pay | Admitting: Specialist

## 2017-05-11 DIAGNOSIS — R200 Anesthesia of skin: Secondary | ICD-10-CM

## 2017-05-11 DIAGNOSIS — R202 Paresthesia of skin: Secondary | ICD-10-CM

## 2017-05-11 DIAGNOSIS — M4316 Spondylolisthesis, lumbar region: Secondary | ICD-10-CM

## 2017-05-11 DIAGNOSIS — M5441 Lumbago with sciatica, right side: Secondary | ICD-10-CM

## 2017-05-12 NOTE — Telephone Encounter (Signed)
Ok to refill 

## 2017-05-12 NOTE — Telephone Encounter (Signed)
Artis Delay, Can you please advise, Dr. Louanne Skye is out of the office till Wednesday. I can call it in if you can approve it.  Thanks

## 2017-05-13 NOTE — Telephone Encounter (Signed)
Called into pharmacy

## 2017-05-19 ENCOUNTER — Telehealth: Payer: Self-pay | Admitting: Pulmonary Disease

## 2017-05-19 NOTE — Telephone Encounter (Signed)
Spoke with pt. Advised her per Brandi's AVS instructions, she is just to follow up with [place] as needed. Pt verbalized understanding. Nothing further was needed.

## 2017-05-21 ENCOUNTER — Telehealth: Payer: Self-pay | Admitting: Cardiology

## 2017-05-21 NOTE — Telephone Encounter (Signed)
New message    Pt is calling asking for a call back. She has a question for the nurse before scheduling a procedure with the orthopedic doctor.

## 2017-05-21 NOTE — Telephone Encounter (Signed)
Pt states she is going to be scheduled for a hip surgery and wants to know if she is OK to have it.  Advised that she should have her surgeon fax over a surical clearance request.  She states understanding.

## 2017-05-27 ENCOUNTER — Telehealth (HOSPITAL_COMMUNITY): Payer: Self-pay

## 2017-05-27 NOTE — Telephone Encounter (Signed)
Encounter complete. 

## 2017-05-28 ENCOUNTER — Ambulatory Visit (HOSPITAL_COMMUNITY)
Admission: RE | Admit: 2017-05-28 | Discharge: 2017-05-28 | Disposition: A | Discharge status: Home / Self Care | Payer: Medicare Other | Source: Ambulatory Visit | Attending: Cardiology | Admitting: Cardiology

## 2017-05-28 DIAGNOSIS — I252 Old myocardial infarction: Secondary | ICD-10-CM | POA: Diagnosis not present

## 2017-05-28 DIAGNOSIS — I10 Essential (primary) hypertension: Secondary | ICD-10-CM | POA: Diagnosis not present

## 2017-05-28 DIAGNOSIS — R0602 Shortness of breath: Secondary | ICD-10-CM | POA: Diagnosis present

## 2017-05-28 DIAGNOSIS — Z01810 Encounter for preprocedural cardiovascular examination: Secondary | ICD-10-CM

## 2017-05-28 DIAGNOSIS — R5383 Other fatigue: Secondary | ICD-10-CM | POA: Diagnosis not present

## 2017-05-28 DIAGNOSIS — I25810 Atherosclerosis of coronary artery bypass graft(s) without angina pectoris: Secondary | ICD-10-CM

## 2017-05-28 DIAGNOSIS — Z952 Presence of prosthetic heart valve: Secondary | ICD-10-CM

## 2017-05-28 DIAGNOSIS — I739 Peripheral vascular disease, unspecified: Secondary | ICD-10-CM | POA: Diagnosis not present

## 2017-05-28 DIAGNOSIS — Z951 Presence of aortocoronary bypass graft: Secondary | ICD-10-CM | POA: Insufficient documentation

## 2017-05-28 DIAGNOSIS — Z720 Tobacco use: Secondary | ICD-10-CM

## 2017-05-28 DIAGNOSIS — R0609 Other forms of dyspnea: Secondary | ICD-10-CM | POA: Insufficient documentation

## 2017-05-28 LAB — MYOCARDIAL PERFUSION IMAGING
LV dias vol: 58 mL (ref 46–106)
LV sys vol: 18 mL
Peak HR: 88 {beats}/min
Rest HR: 61 {beats}/min
SDS: 3
SRS: 1
SSS: 4
TID: 1.26

## 2017-05-28 MED ORDER — REGADENOSON 0.4 MG/5ML IV SOLN
0.4000 mg | Freq: Once | INTRAVENOUS | Status: AC
  Administered 2017-05-28: 0.4 mg via INTRAVENOUS

## 2017-05-28 MED ORDER — TECHNETIUM TC 99M TETROFOSMIN IV KIT
9.5000 | PACK | Freq: Once | INTRAVENOUS | Status: AC | PRN
  Administered 2017-05-28: 9.5 via INTRAVENOUS
  Filled 2017-05-28: qty 10

## 2017-05-28 MED ORDER — TECHNETIUM TC 99M TETROFOSMIN IV KIT
30.8000 | PACK | Freq: Once | INTRAVENOUS | Status: AC | PRN
  Administered 2017-05-28: 30.8 via INTRAVENOUS
  Filled 2017-05-28: qty 31

## 2017-06-10 ENCOUNTER — Encounter: Payer: Self-pay | Admitting: Gastroenterology

## 2017-06-10 ENCOUNTER — Other Ambulatory Visit (INDEPENDENT_AMBULATORY_CARE_PROVIDER_SITE_OTHER): Payer: Medicare Other

## 2017-06-10 ENCOUNTER — Ambulatory Visit (INDEPENDENT_AMBULATORY_CARE_PROVIDER_SITE_OTHER): Payer: Medicare Other | Admitting: Gastroenterology

## 2017-06-10 VITALS — BP 110/60 | HR 80 | Ht 59.0 in | Wt 151.2 lb

## 2017-06-10 DIAGNOSIS — D649 Anemia, unspecified: Secondary | ICD-10-CM

## 2017-06-10 LAB — CBC WITH DIFFERENTIAL/PLATELET
BASOS PCT: 0.9 % (ref 0.0–3.0)
Basophils Absolute: 0.1 10*3/uL (ref 0.0–0.1)
EOS PCT: 1.3 % (ref 0.0–5.0)
Eosinophils Absolute: 0.1 10*3/uL (ref 0.0–0.7)
HEMATOCRIT: 35.3 % — AB (ref 36.0–46.0)
HEMOGLOBIN: 11.2 g/dL — AB (ref 12.0–15.0)
LYMPHS PCT: 16.9 % (ref 12.0–46.0)
Lymphs Abs: 1.3 10*3/uL (ref 0.7–4.0)
MCHC: 31.7 g/dL (ref 30.0–36.0)
MCV: 86.1 fl (ref 78.0–100.0)
MONOS PCT: 8.1 % (ref 3.0–12.0)
Monocytes Absolute: 0.6 10*3/uL (ref 0.1–1.0)
NEUTROS ABS: 5.5 10*3/uL (ref 1.4–7.7)
Neutrophils Relative %: 72.8 % (ref 43.0–77.0)
Platelets: 218 10*3/uL (ref 150.0–400.0)
RBC: 4.1 Mil/uL (ref 3.87–5.11)
RDW: 20.7 % — AB (ref 11.5–15.5)
WBC: 7.5 10*3/uL (ref 4.0–10.5)

## 2017-06-10 NOTE — Progress Notes (Signed)
Review of pertinent gastrointestinal problems: 1.  Hx of intestinal AVMs. Remote history of Hemoccult-positive stool and recurrent anemia evaluated by [facility].  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 Colonoscopy July 2009 -  ulcerated cecum and ileocecal valve. Exam otherwise normal.  Ileocecal valve and cecum biopsies revealed surface ulceration/erosion and ischemic changes.  Small bowel video capsule study 2010 revealed nonbleeding AVMs throughout the small bowel.  Admitted  2018 with heme + anemia (hb 8s when it normally was 13).  03/2017 colonoscopy Dr. Havery Moros: normal TI, single friable AVM in cecum ablated with APC. 03/2017 EGD Dr. Havery Moros: 2 'dimunitive' non-bleeding gastric ulcers.  Single AVM treated with ACP.  Biopsies from stomach neg for H. Pylori.  2. Dysphagia, EGD Dr. Ardis Hughs 9 2016 showed mild nonspecific gastritis. This was H. pylori negative. GE junction was normal. She was instructed to chew her food well, eat slowly and take small bites. 3. Chronic loose stools, colonoscopy Dr. Ardis Hughs 9 2016 was very poorly prepped, random biopsies of the colon showed no sign of microscopic colitis however. She was recommended to have repeat colonoscopy with improved prep.  HPI: This is a very pleasant 76 year old woman whom I last saw about 2 years ago.  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 Her hemoglobin was around 8. Colonoscopy and upper endoscopy results are summarized above. Since then she has been taking 2 pills of iron a day as well as proton pump inhibitor twice daily. Her biggest complaint today is right hip pain, she is going to be undergoing a right hip replacement at some point.  She continues to need NSAIDs for her severe right hip pain she tries to limit to one pill once daily.  She has had dark  stools since starting iron. No overtly bloody appearing stools. No nausea or vomiting.  ROS: complete GI ROS as described in HPI, all other review negative.  Constitutional:  No unintentional weight loss   Past Medical History:  Diagnosis Date  . Abdominal pain, unspecified site   . Anemia    Iron deficiency anemia  . Anxiety   . Aortic stenosis   . CAD (coronary artery disease)    psot bypass x2 to diagonal and obtuse marginal. SVG graft-01/08/2009  . GERD (gastroesophageal reflux disease)   . History of cardiovascular stress test    Myoview 7/16:  Low risk, no ischemia or scar, EF 72%  . History of echocardiogram    Echo 7/16:  EF 55-60%, no RWMA, Gr 2 DD, AVR ok (mean 20 mmHg), PASP 40 mmHg  . History of GI bleed    With AVM  . Hypercholesterolemia   . Hyperlipidemia   . Hypertension   . Iron deficiency anemia   . Myocardial infarction ([place]) 2010  . Peripheral vascular disease ([place])   . Shoulder impingement syndrome    Right shoulder    Past Surgical History:  Procedure Laterality Date  . ABDOMINAL HYSTERECTOMY    . AORTIC VALVE REPLACEMENT     [place] #21  . CARDIAC CATHETERIZATION N/A 08/11/2015   Procedure: Right Heart Cath;  Surgeon: Jolaine Artist, MD;  Location: [facility];  Service: Cardiovascular;  Laterality: N/A;  . CARDIAC SURGERY  2010   , 2 blockages  . CHOLECYSTECTOMY    . COLONOSCOPY N/A 04/12/2017   Procedure: COLONOSCOPY;  Surgeon: Manus Gunning, MD;  Location: [facility];  Service: Gastroenterology;  Laterality: N/A;  . CORONARY ARTERY BYPASS GRAFT    . ESOPHAGOGASTRODUODENOSCOPY N/A 04/12/2017   Procedure: ESOPHAGOGASTRODUODENOSCOPY (EGD);  Surgeon: Manus Gunning, MD;  Location: [facility];  Service: Gastroenterology;  Laterality: N/A;  . HEEL SPUR EXCISION  2000   left heel  . SPINE SURGERY  2003   Herniated diskectomy  . WRIST SURGERY     right wrist    Current Outpatient Prescriptions  Medication Sig  Dispense Refill  . acetaminophen (TYLENOL) 325 MG tablet Take 2 tablets (650 mg total) by mouth every 6 (six) hours as needed for mild pain (or Fever >/= 101).    Marland Kitchen amLODipine (NORVASC) 10 MG tablet TAKE 1 TABLET BY MOUTH EVERY DAY 30 tablet 11  . aspirin 81 MG tablet Take 81 mg by mouth daily.    Marland Kitchen atorvastatin (LIPITOR) 80 MG tablet TAKE 1 TABLET BY MOUTH EVERY DAY 30 tablet 5  . BYSTOLIC 5 MG tablet TAKE 1 TABLET BY MOUTH EVERY DAY 90 tablet 0  . Calcium Carbonate-Vitamin D (CALCIUM 600+D) 600-400 MG-UNIT per tablet Take 1 tablet by mouth daily.    . ferrous sulfate 325 (65 FE) MG tablet Take 1 tablet (325 mg total) by mouth 2 (two) times daily with a meal. 60 tablet 0  . furosemide (LASIX) 20 MG tablet TAKE 1 TABLET BY MOUTH EVERY DAY AS NEEDED (Patient taking differently: TAKE 1 TABLET BY MOUTH EVERY DAY AS NEEDED FOR FLUID) 90 tablet 1  . glycopyrrolate (ROBINUL) 2 MG tablet Take 2 mg by mouth 2 (two) times daily.  1  . lisinopril-hydrochlorothiazide (PRINZIDE,ZESTORETIC) 20-25 MG tablet Take 1/2 tablet (10-12.5mg ) by mouth two times a day 90 tablet 3  . LORazepam (ATIVAN) 0.5 MG tablet Take 0.5 mg by mouth at bedtime as needed for anxiety or sleep.     . meloxicam (MOBIC) 7.5 MG tablet TAKE 1 TABLET BY MOUTH TWICE A DAY AS NEEDED FOR PAIN 60 tablet 5  . Multiple Vitamin (MULTIVITAMIN) tablet Take 1 tablet by mouth daily.    Marland Kitchen omeprazole (PRILOSEC) 40 MG capsule Take 1 capsule (40 mg total) by mouth 2 (two) times daily. 60 capsule 0  . potassium chloride (MICRO-K) 10 MEQ CR capsule TAKE 2 CAPSULES BY MOUTH TWICE A DAY 120 capsule 10  . sertraline (ZOLOFT) 100 MG tablet TAKE 2 TABLETS (200MG ) BY MOUTH ONCE A DAY  1  . sucralfate (CARAFATE) 1 G tablet TAKE 1 TABLET BY MOUTH 4 TIMES DAILY - WITH MEALS AND AT BEDTIME. (Patient taking differently: TAKE 1 TABLET BY MOUTH TWICE DAILY) 90 tablet 1  . tiotropium (SPIRIVA HANDIHALER) 18 MCG inhalation capsule Place 1 capsule (18 mcg total) into inhaler  and inhale every morning. 30 capsule 12  . traMADol (ULTRAM) 50 MG tablet TAKE 1 TABLET BY MOUTH EVERY 6 HOURS AS NEEDED FOR PAIN 30 tablet 0   No current facility-administered medications for this visit.     Allergies as of 06/10/2017 - Review Complete 06/10/2017  Allergen Reaction Noted  . Codeine Other (See Comments) 10/14/2011    Family History  Problem Relation Age of Onset  . Hypertension Mother   . Kidney disease Mother   . Hypertension Father   . Hypertension Sister   . Hypertension Brother   . Hypertension Daughter   . Diabetes Brother   . Heart attack Neg Hx   . Stroke Neg Hx     Social History  Social History  . Marital status: Single    Spouse name: N/A  . Number of children: 3  . Years of education: N/A   Occupational History  . Retired    Social History Main Topics  . Smoking status: Current Every Day Smoker    Packs/day: 1.00    Years: 52.00    Types: Cigarettes    Last attempt to quit: 01/21/2017  . Smokeless tobacco: Never Used     Comment: Now down to 3-4 cigs per day  . Alcohol use No  . Drug use: No  . Sexual activity: Not on file   Other Topics Concern  . Not on file   Social History Narrative  . No narrative on file     Physical Exam: BP 110/60 (BP Location: Left Arm, Patient Position: Sitting, Cuff Size: Normal)   Pulse 80   Ht 4\' 11"  (1.499 m)   Wt 151 lb 4 oz (68.6 kg)   LMP  (LMP Unknown)   BMI 30.55 kg/m  Constitutional: generally well-appearing Psychiatric: alert and oriented x3 Abdomen: soft, nontender, nondistended, no obvious ascites, no peritoneal signs, normal bowel sounds No peripheral edema noted in lower extremities  Assessment and plan: 76 y.o. female with Recent anemia from gastric, colon AVM, gastric ulcers  I reviewed the upper endoscopy reports and images and she did have 2 "diminutive ulcers". These are very innocent appearing and appear to be more like small erosions then ulcers to me. I do not think that  she needs repeat EGD for surveillance to confirm healing since they were so small. She has not had overt bleeding. She takes iron twice daily and this seems to upset her stomach a little bit inside recommended she cut back to once daily. We will get a CBC on her today confirm that her blood counts are improving. She will stay on proton pump inhibitor twice daily for as long as she continues to need NSAIDs which I suspect will be quite a while given her right hip pain. She knows to keep limiting the NSAIDs to once a day if possible.  Please see the "Patient Instructions" section for addition details about the plan.  Owens Loffler, MD [facility] 06/10/2017, 1:47 PM

## 2017-06-10 NOTE — Patient Instructions (Addendum)
You will have labs checked today in the basement lab.  Please head down after you check out with the front desk  (cbc).  Decrease to iron one pill once daily.  Stay on prilosec one pill twice daily for now.  Normal BMI (Body Mass Index- based on height and weight) is between 23 and 30. Your BMI today is Body mass index is 30.55 kg/m. Marland Kitchen Please consider follow up  regarding your BMI with your Primary Care Provider.

## 2017-06-17 ENCOUNTER — Other Ambulatory Visit: Payer: Self-pay

## 2017-06-17 DIAGNOSIS — D649 Anemia, unspecified: Secondary | ICD-10-CM

## 2017-06-28 ENCOUNTER — Other Ambulatory Visit: Payer: Self-pay | Admitting: Cardiology

## 2017-07-15 ENCOUNTER — Ambulatory Visit (INDEPENDENT_AMBULATORY_CARE_PROVIDER_SITE_OTHER): Payer: Medicare Other | Admitting: Cardiology

## 2017-07-15 ENCOUNTER — Encounter: Payer: Self-pay | Admitting: Cardiology

## 2017-07-15 VITALS — BP 125/60 | HR 72 | Ht <= 58 in | Wt 151.6 lb

## 2017-07-15 DIAGNOSIS — I25810 Atherosclerosis of coronary artery bypass graft(s) without angina pectoris: Secondary | ICD-10-CM | POA: Diagnosis not present

## 2017-07-15 DIAGNOSIS — Z952 Presence of prosthetic heart valve: Secondary | ICD-10-CM

## 2017-07-15 DIAGNOSIS — I2721 Secondary pulmonary arterial hypertension: Secondary | ICD-10-CM

## 2017-07-15 DIAGNOSIS — Z01810 Encounter for preprocedural cardiovascular examination: Secondary | ICD-10-CM

## 2017-07-15 NOTE — Progress Notes (Signed)
[place] [address], [place]  [address] Phone: [phone] Fax:  [phone]  Date:  07/15/2017   ID:  [name], DOB 24-Jun-1941, MRN [id number]  PCP:  Donald Prose, MD   History of Present Illness: [name] is a 76 y.o. female with coronary artery disease status post bypass x2- grafts to diagonal and obtuse marginal 12/29/08, aortic valve replacement with bioprosthetic #21 valve, hypertension, AVM/gastric ulcer with prior GI bleed here for followup.  She had a Holter monitor placed in May 2015 which demonstrated no significant bradycardia, rare PACs. Echocardiogram obtained on 03/30/2015 showed EF 31-49%, grade 2 diastolic dysfunction, no regional wall motion abnormality, bioprosthetic aortic valve present and functioning normally, PA peak pressure 40. She had an normal Myoview on 03/30/2015 which showed low risk study, EF greater than 65%, no ischemia or infarction. She was seen by pulmonology, pulmonary function tests showed a normal lung capacity, no airflow obstruction, but markedly decreased diffusion capacity in the face of normal hemoglobin. Per pulmonologist, there is no clear evidence of lung disease, however very concerned about the possibility of pulmonary arterial hypertension. Chest x-ray is unremarkable, BNP was normal.  She underwent right heart cath by Dr. Haroldine Laws on 08/11/2015 which showed PA pressure 53/14, with mean of 29, cardiac output of 3.2, cardiac index 1.9. It appears she has very good filling pressure, however she had mild PAH and moderately elevated PVR. Given relatively normal mixed venous saturation, suspect cardiac output may be falsely low. After discussing with pulmonology, it was decided not to start selective pulmonary dilator at this time.   Echocardiogram (repeated multiple times) demonstrated bioprosthetic aortic valve velocity of 3.0 m/s which is upper limits of normal as well as ascending root dilated aorta of 4.3 cm. Her  overall ejection fraction was normal.  In regards to lipids, LDL has been under good control with atorvastatin. Prior lab work reviewed. Previous creatinine 0.6.  She has had continued somatic complaints, aches and pains she states. No exertional anginal symptoms. Left lateral leg pain and we continued to express the importance of exercise and conditioning. We have tried Bystolic instead of metoprolol previously because of previously described fatigue.  Holter monitor in May of 2015 demonstrated no significant bradycardia, rare PACs. No new medications needed.  07/15/17-today she is here for preoperative hip surgery.  Reviewed nuclear stress test done recently with no ischemia.  She has baseline shortness of breath.  She has hindered with ambulation.  No syncope, no orthopnea.  No heart failure-like symptoms.  She did have a GI bleeding episode, 2 units, gastric ulcer noted in July hospitalization.   Wt Readings from Last 3 Encounters:  07/15/17 151 lb 9.6 oz (68.8 kg)  06/10/17 151 lb 4 oz (68.6 kg)  05/28/17 158 lb (71.7 kg)     Past Medical History:  Diagnosis Date  . Abdominal pain, unspecified site   . Anemia    Iron deficiency anemia  . Anxiety   . Aortic stenosis   . CAD (coronary artery disease)    psot bypass x2 to diagonal and obtuse marginal. SVG graft-01/08/2009  . GERD (gastroesophageal reflux disease)   . History of cardiovascular stress test    Myoview 7/16:  Low risk, no ischemia or scar, EF 72%  . History of echocardiogram    Echo 7/16:  EF 55-60%, no RWMA, Gr 2 DD, AVR ok (mean 20 mmHg), PASP 40 mmHg  . History of GI bleed  With AVM  . Hypercholesterolemia   . Hyperlipidemia   . Hypertension   . Iron deficiency anemia   . Myocardial infarction ([place]) 2010  . Peripheral vascular disease ([employer])   . Shoulder impingement syndrome    Right shoulder    Past Surgical History:  Procedure Laterality Date  . ABDOMINAL HYSTERECTOMY    . AORTIC VALVE REPLACEMENT       [place] #[id number]  . CARDIAC CATHETERIZATION N/A 08/11/2015   Procedure: Right Heart Cath;  Surgeon: Jolaine Artist, MD;  Location: [facility];  Service: Cardiovascular;  Laterality: N/A;  . CARDIAC SURGERY  2010   , 2 blockages  . CHOLECYSTECTOMY    . COLONOSCOPY N/A 04/12/2017   Procedure: COLONOSCOPY;  Surgeon: Manus Gunning, MD;  Location[facility];  Service: Gastroenterology;  Laterality: N/A;  . CORONARY ARTERY BYPASS GRAFT    . ESOPHAGOGASTRODUODENOSCOPY N/A 04/12/2017   Procedure: ESOPHAGOGASTRODUODENOSCOPY (EGD);  Surgeon: Manus Gunning, MD;  Location: [facility];  Service: Gastroenterology;  Laterality: N/A;  . HEEL SPUR EXCISION  2000   left heel  . SPINE SURGERY  2003   Herniated diskectomy  . WRIST SURGERY     right wrist    Current Outpatient Prescriptions  Medication Sig Dispense Refill  . acetaminophen (TYLENOL) 325 MG tablet Take 2 tablets (650 mg total) by mouth every 6 (six) hours as needed for mild pain (or Fever >/= 101).    Marland Kitchen amLODipine (NORVASC) 10 MG tablet TAKE 1 TABLET BY MOUTH EVERY DAY 30 tablet 11  . aspirin 81 MG tablet Take 81 mg by mouth daily.    Marland Kitchen atorvastatin (LIPITOR) 80 MG tablet TAKE 1 TABLET BY MOUTH EVERY DAY 30 tablet 5  . BYSTOLIC 5 MG tablet TAKE 1 TABLET BY MOUTH EVERY DAY 90 tablet 2  . Calcium Carbonate-Vitamin D (CALCIUM 600+D) 600-400 MG-UNIT per tablet Take 1 tablet by mouth daily.    . ferrous sulfate 325 (65 FE) MG tablet Take 1 tablet (325 mg total) by mouth 2 (two) times daily with a meal. 60 tablet 0  . furosemide (LASIX) 20 MG tablet Take 20 mg by mouth daily as needed for fluid.    Marland Kitchen glycopyrrolate (ROBINUL) 2 MG tablet Take 2 mg by mouth 2 (two) times daily.  1  . lisinopril-hydrochlorothiazide (PRINZIDE,ZESTORETIC) 20-25 MG tablet Take 1/2 tablet (10-12.5mg ) by mouth two times a day 90 tablet 3  . LORazepam (ATIVAN) 0.5 MG tablet Take 0.5 mg by mouth at bedtime as needed for anxiety or sleep.      . meloxicam (MOBIC) 7.5 MG tablet TAKE 1 TABLET BY MOUTH TWICE A DAY AS NEEDED FOR PAIN 60 tablet 5  . Multiple Vitamin (MULTIVITAMIN) tablet Take 1 tablet by mouth daily.    Marland Kitchen omeprazole (PRILOSEC) 40 MG capsule Take 1 capsule (40 mg total) by mouth 2 (two) times daily. 60 capsule 0  . potassium chloride (MICRO-K) 10 MEQ CR capsule TAKE 2 CAPSULES BY MOUTH TWICE A DAY 120 capsule 10  . sertraline (ZOLOFT) 100 MG tablet TAKE 2 TABLETS (200MG ) BY MOUTH ONCE A DAY  1  . sucralfate (CARAFATE) 1 g tablet Take 1 g by mouth 2 (two) times daily.    Marland Kitchen tiotropium (SPIRIVA HANDIHALER) 18 MCG inhalation capsule Place 1 capsule (18 mcg total) into inhaler and inhale every morning. 30 capsule 12  . traMADol (ULTRAM) 50 MG tablet TAKE 1 TABLET BY MOUTH EVERY 6 HOURS AS NEEDED FOR PAIN 30 tablet 0  No current facility-administered medications for this visit.     Allergies:    Allergies  Allergen Reactions  . Codeine Other (See Comments)    Feeling intoxicated    Social History:  The patient  reports that she has been smoking Cigarettes.  She has a 52.00 pack-year smoking history. She has never used smokeless tobacco. She reports that she does not drink alcohol or use drugs.   ROS:  Please see the history of present illness.   No melena, no syncope, no orthopnea.  Positive for orthopedic issues. PHYSICAL EXAM: VS:  BP 125/60   Pulse 72   Ht 4\' 9"  (1.448 m)   Wt 151 lb 9.6 oz (68.8 kg)   LMP  (LMP Unknown)   BMI 32.81 kg/m  GEN: Well nourished, well developed, in no acute distress, walking slowly with a cane HEENT: normal  Neck: no JVD, carotid bruits, or masses Cardiac: RRR; 2/6 SM, no rubs, or gallops,no edema  Respiratory: Decreased air movement bilaterally, no active wheezing  GI: soft, nontender, nondistended, + BS MS: no deformity or atrophy  Skin: warm and dry, no rash Neuro:  Alert and Oriented x 3, Strength and sensation are intact Psych: euthymic mood, full affect   EKG:  07/14/14-sinus rhythm, minimal sinus arrhythmia, no other abnormalities. 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 No significant change from prior.  Right heart cath 08/11/15 (dx SOB) Findings:  RA = 7 RV = 49/0/10 PA = 53/14 (29) PCW = 9 Fick cardiac output/index = 3.2/1.9 PVR = 6.4 WU Ao sat = 96% PA sat = 56%, 57%  Assessment: 1. Normal R and L sided filling pressures 2. Mild PAH with moderately elevated PVR 3. Low cardiac output  Plan/Discussion:  Filling pressures look great. She does have mild PAH moderately elevated PVR. Given relatively normal mixed venous saturations suspect Fick cardiac output may be falsely low and this will raise PVR. Would not start selective pulmonary dilators at this time. If symptoms persist could consider cardiopulmonary exercise testing to further evaluate. Will d/w Dr. Lake Bells.  Bensimhon, Daniel,MD  ECHO : 07/16/16  - Left ventricle: The cavity size was normal. There was moderate   concentric hypertrophy. Systolic function was normal. The   estimated ejection fraction was in the range of 60% to 65%. Wall   motion was normal; there were no regional wall motion   abnormalities. Doppler parameters are consistent with abnormal   left ventricular relaxation (grade 1 diastolic dysfunction). - Aortic valve: A prosthesis was present and functioning normally.   The prosthesis had a normal range of motion. The sewing ring   appeared normal, had no rocking motion, and showed no evidence of   dehiscence. Peak velocity (S): 289 cm/s. (within normal limits   post valve replacement) Mean gradient (S): 19 mm Hg. - Mitral valve: Calcified annulus. - Tricuspid valve: There was trivial regurgitation. - Pulmonary arteries: Systolic pressure was mildly increased. PA   peak pressure: 38 mm Hg (S).  Impressions:  - Similar aortic valve gradients when compared  to prior study.  LABS: 06/28/13 - LDL 67.  ASSESSMENT AND PLAN:   1. Preoperative cardiac risk stratification prior to hip replacement-nuclear stress test 05/28/17 is low risk with no ischemia.  She may proceed with orthopedic surgery with moderate overall cardiac risk based upon her prior bypass surgery and aortic valve replacement.  She does also have underlying COPD which could prove  to be a hindrance from a ventilatory standpoint.  She also had a possible bleeding gastric ulcer in July 2018 which required 2 units of blood.  Endoscopy is performed.  She is quite debilitated with her hip and is looking forward to possibly having it replaced. 2. Coronary artery disease status post bypass-no real change with baseline shortness of breath.  No chest pain.  Continue with aspirin., Beta blocker (bystolic), statin, ACE inhibitor. Previous bypass anatomy reviewed. Secondary prevention. 3. Dyspnea-Dr. Anastasia Pall note reviewed. No evidence of obstruction on pulmonary function test.  Her x-ray does show evidence of COPD.  She does have occasional wheezing she states. Right catheterization reviewed as above. Her previous measurement was 40 mmHg, mild. Smoking cessation nonetheless is key as well as blood pressure control. Continue to encourage exercise. Gave her recommendation for Coricidin HBP for colds.  4. Aortic valve replacement-upper normal velocity on echocardiogram. Continue to monitor. Murmur appreciated on exam. Discussed with Dr. [name] in the past.  No major changes.  Continue with antibiotic prophylaxis. 5. AVM-prior GI bleed.  2 units of blood given in July 2018.  No NSAIDs.  Endoscopy performed possible gastric ulcer.   6. Hypertension essential-under great control. No changes made.  Stable. 7. Dysphagia-had endoscopies with Dr. Ardis Hughs. Mild gastritis noted. Recommended to chew food well, small bites. 8. Hyperlipidemia- Overall good control. High-dose statin. 9. Tobacco use-encourage tobacco  cessation. 5 cigs a day.  Continue to work on cessation  10. obesity-encourage weight loss.. 11. Dilated aortic root-we will continue to monitor with echocardiogram. Stable 12. 12 month follow  Signed, [name], MD [facility]  07/15/2017 9:28 AM

## 2017-07-15 NOTE — Patient Instructions (Signed)
Your physician recommends that you continue on your current medications as directed. Please refer to the Current Medication list given to you today.  Your physician wants you to follow-up in: [place] will receive a reminder letter in the mail two months in advance. If you don't receive a letter, please call our office to schedule the follow-up appointment.

## 2017-07-18 ENCOUNTER — Encounter: Payer: Self-pay | Admitting: Acute Care

## 2017-07-23 ENCOUNTER — Other Ambulatory Visit (INDEPENDENT_AMBULATORY_CARE_PROVIDER_SITE_OTHER): Payer: Self-pay | Admitting: Orthopaedic Surgery

## 2017-07-23 DIAGNOSIS — R200 Anesthesia of skin: Secondary | ICD-10-CM

## 2017-07-23 DIAGNOSIS — M4316 Spondylolisthesis, lumbar region: Secondary | ICD-10-CM

## 2017-07-23 DIAGNOSIS — R202 Paresthesia of skin: Secondary | ICD-10-CM

## 2017-07-23 DIAGNOSIS — M5441 Lumbago with sciatica, right side: Secondary | ICD-10-CM

## 2017-07-23 NOTE — Telephone Encounter (Signed)
Please advise 

## 2017-07-24 NOTE — Telephone Encounter (Signed)
Called into pharmacy

## 2017-07-29 ENCOUNTER — Ambulatory Visit (INDEPENDENT_AMBULATORY_CARE_PROVIDER_SITE_OTHER): Payer: Medicare Other | Admitting: Pulmonary Disease

## 2017-07-29 ENCOUNTER — Encounter: Payer: Self-pay | Admitting: Pulmonary Disease

## 2017-07-29 VITALS — BP 128/64 | HR 75 | Ht <= 58 in | Wt 151.0 lb

## 2017-07-29 DIAGNOSIS — R400 Somnolence: Secondary | ICD-10-CM

## 2017-07-29 DIAGNOSIS — F17210 Nicotine dependence, cigarettes, uncomplicated: Secondary | ICD-10-CM

## 2017-07-29 DIAGNOSIS — Z23 Encounter for immunization: Secondary | ICD-10-CM

## 2017-07-29 DIAGNOSIS — I2721 Secondary pulmonary arterial hypertension: Secondary | ICD-10-CM

## 2017-07-29 DIAGNOSIS — Z720 Tobacco use: Secondary | ICD-10-CM

## 2017-07-29 DIAGNOSIS — J432 Centrilobular emphysema: Secondary | ICD-10-CM | POA: Diagnosis not present

## 2017-07-29 NOTE — Progress Notes (Signed)
Subjective:    Patient ID: [name], female    DOB: January 04, 1941, 76 y.o.   MRN: [id number] Synopsis: Refer to [facility] in 2016 for evaluation of shortness of breath. Pulmonary function testing showed an isolated diffusion defect in September 2016, July 2016 echocardiogram suggested pulmonary hypertension.  She still smokes as of 07/2017.  She also has a history of aortic valve repair.    HPI Chief Complaint  Patient presents with  . Follow-up    pt has good and bad days.  does c/o DOE.  needs sx clearance for R hip replacement.    [name] states that she has been having trouble with her left hip since last visit and she is here to see [place] today for perioperative risk stratification for upcoming hip replacement.  She tells me that in the last year she has not had pneumonia or bronchitis.  She has not needed to take antibiotics or prednisone for an acute respiratory problem.  She has been treated with Spiriva daily which she says does not make much of a difference in her breathing.  In general she says that she can walk on level ground and she has to stop because of hip pain before she develops any sort of breathing difficulty.  She does have a dry cough from time to time but she denies hemoptysis or mucus production.  She says that the shortness of breath has not worsened at all and in fact she thinks it may be better in the last several months compared to when I first met her in 2016.  She notes some chest tightness from time to time chest pain.  He tells me that she feels sleepy frequently during the daytime.  She has been told that she snores.   Past Medical History:  Diagnosis Date  . Abdominal pain, unspecified site   . Anemia    Iron deficiency anemia  . Anxiety   . Aortic stenosis   . CAD (coronary artery disease)    psot bypass x2 to diagonal and obtuse marginal. SVG graft-01/08/2009  . GERD (gastroesophageal reflux disease)   . History of cardiovascular stress test    Myoview 7/16:  Low risk, no ischemia or scar, EF 72%  . History of echocardiogram    Echo 7/16:  EF 55-60%, no RWMA, Gr 2 DD, AVR ok (mean 20 mmHg), PASP 40 mmHg  . History of GI bleed    With AVM  . Hypercholesterolemia   . Hyperlipidemia   . Hypertension   . Iron deficiency anemia   . Myocardial infarction ([place]) 2010  . Peripheral vascular disease ([place])   . Shoulder impingement syndrome    Right shoulder      Review of Systems  Constitutional: Negative for chills, fatigue and fever.  HENT: Negative for postnasal drip, rhinorrhea and sinus pressure.   Respiratory: Positive for shortness of breath. Negative for cough and wheezing.   Cardiovascular: Positive for leg swelling. Negative for chest pain and palpitations.       Objective:   Physical Exam Vitals:   07/29/17 1641  BP: 128/64  Pulse: 75  SpO2: 97%  Weight: 151 lb (68.5 kg)  Height: '4\' 9"'$  (1.448 m)    Gen: chronically ill appearing HENT: OP clear, TM's clear, neck supple, neck circumference is 15 inches PULM: Few crackles L base, normal percussion CV: RRR, systolic murmur, trace edema GI: BS+, soft, nontender Derm: no cyanosis or rash Psyche: normal mood and affect  Cardiology records  reviewed where she was seen for her coronary artery disease, and aortic valvular disease  Chest imaging: 2017 Screening CT chest images reviewed: s/p aortic valve replacement, RADS2, perhaps mild centrilobular emphysema, likely PAH, soft tissue swelling, perhaps peri-aortic lymph node near enlarged ascending aora  Echo: July 2016 echocardiogram shows slightly elevated PA pressure, upper limit of normal aortic valve gradient  Labs: July 2016 hemoglobin 14.4  Right Heart Catheterization 08/11/15  RHC RA = 7 RV = 49/0/10 PA = 53/14 (29) PCW = 9 Fick cardiac output/index = 3.2/1.9 PVR = 6.4 WU Ao sat = 96% PA sat = 56%, 57%   Nuclear stress test: Myoview 03/2015  Myoview Study Highlights 03/2015   Nuclear stress EF:  72%.  There was no ST segment deviation noted during stress.  The study is normal.  This is a low risk study.  The left ventricular ejection fraction is hyperdynamic (>65%).  Low risk study Normal resting and stress perfusion with no ischemia or infarction EF 72%    PFT: 03/2017 Ratio 85% FEV 0.9L (69% pred), FVC 1.06 L (62% pred), TLC: 2.55L (62% pred), ERV -78%?; couldn't perform DLCO      Assessment & Plan:   Daytime somnolence - Plan: Home sleep test  PAH (pulmonary artery hypertension) ([place])  Tobacco abuse  Cigarette smoker  Centrilobular emphysema ([place])  Discussion: Bina comes today for perioperative risk stratification.  She does have mild pulmonary hypertension in the setting of centrilobular emphysema seen on the CT scan of her lungs.  This is due undoubtedly to cor pulmonale perhaps obstructive sleep apnea based on her symptoms, see discussion below.  I think that her aortic disease may also contribute there that seems to be fairly well controlled based on her recent cardiac assessment.  From a respiratory standpoint I think it is fine for her to proceed with surgery, I told her that her likelihood of having a respiratory complication would be less than 5%.  She needs to continue taking Spiriva daily.  She will need a flu shot today.  I am concerned that she may have sleep apnea.  Her neck size is elevated, she is a postmenopausal female, she is sleepy during the day she has COPD.  All of these are factors for obstructive sleep apnea.  Plan:  Centrilobular Emphysema: This means that the tobacco smoke has caused damage to your lungs Need to stop smoking right away Continue Spiriva daily Flu shot today Take Spiriva the day of surgery  Pulmonary hypertension: Because you have this and have experienced sleepiness recently during the daytime I am concerned that you have sleep apnea We will arrange for a home sleep study and call you with the results  Upcoming  surgery: As stated today at your risk of having perioperative pulmonary complication should be less than 5% if you are feeling well prior to surgery It's OK from my perspective for you to proceed with surgery  Cigarette smoking: Stop smoking Continue follow-up with a lung cancer screening program  We will see you back in 3 months or sooner if needed    Current Outpatient Medications:  .  acetaminophen (TYLENOL) 325 MG tablet, Take 2 tablets (650 mg total) by mouth every 6 (six) hours as needed for mild pain (or Fever >/= 101)., Disp: , Rfl:  .  amLODipine (NORVASC) 10 MG tablet, TAKE 1 TABLET BY MOUTH EVERY DAY, Disp: 30 tablet, Rfl: 11 .  aspirin 81 MG tablet, Take 81 mg by mouth daily., Disp: ,  Rfl:  .  atorvastatin (LIPITOR) 80 MG tablet, TAKE 1 TABLET BY MOUTH EVERY DAY, Disp: 30 tablet, Rfl: 5 .  BYSTOLIC 5 MG tablet, TAKE 1 TABLET BY MOUTH EVERY DAY, Disp: 90 tablet, Rfl: 2 .  Calcium Carbonate-Vitamin D (CALCIUM 600+D) 600-400 MG-UNIT per tablet, Take 1 tablet by mouth daily., Disp: , Rfl:  .  ferrous sulfate 325 (65 FE) MG tablet, Take 1 tablet (325 mg total) by mouth 2 (two) times daily with a meal., Disp: 60 tablet, Rfl: 0 .  furosemide (LASIX) 20 MG tablet, Take 20 mg by mouth daily as needed for fluid., Disp: , Rfl:  .  glycopyrrolate (ROBINUL) 2 MG tablet, Take 2 mg by mouth 2 (two) times daily., Disp: , Rfl: 1 .  lisinopril-hydrochlorothiazide (PRINZIDE,ZESTORETIC) 20-25 MG tablet, Take 1/2 tablet (10-12.'5mg'$ ) by mouth two times a day, Disp: 90 tablet, Rfl: 3 .  LORazepam (ATIVAN) 0.5 MG tablet, Take 0.5 mg by mouth at bedtime as needed for anxiety or sleep. , Disp: , Rfl:  .  meloxicam (MOBIC) 7.5 MG tablet, TAKE 1 TABLET BY MOUTH TWICE A DAY AS NEEDED FOR PAIN, Disp: 60 tablet, Rfl: 5 .  Multiple Vitamin (MULTIVITAMIN) tablet, Take 1 tablet by mouth daily., Disp: , Rfl:  .  omeprazole (PRILOSEC) 40 MG capsule, Take 1 capsule (40 mg total) by mouth 2 (two) times daily., Disp:  60 capsule, Rfl: 0 .  potassium chloride (MICRO-K) 10 MEQ CR capsule, TAKE 2 CAPSULES BY MOUTH TWICE A DAY, Disp: 120 capsule, Rfl: 10 .  sertraline (ZOLOFT) 100 MG tablet, TAKE 2 TABLETS ('200MG'$ ) BY MOUTH ONCE A DAY, Disp: , Rfl: 1 .  sucralfate (CARAFATE) 1 g tablet, Take 1 g by mouth 2 (two) times daily., Disp: , Rfl:  .  tiotropium (SPIRIVA HANDIHALER) 18 MCG inhalation capsule, Place 1 capsule (18 mcg total) into inhaler and inhale every morning., Disp: 30 capsule, Rfl: 12 .  traMADol (ULTRAM) 50 MG tablet, TAKE 1 TABLET BY MOUTH EVERY 6 HOURS AS NEEDED FOR PAIN, Disp: 30 tablet, Rfl: 0

## 2017-07-29 NOTE — Patient Instructions (Signed)
Centrilobular Emphysema: This means that the tobacco smoke has caused damage to your lungs Need to stop smoking right away Continue Spiriva daily Flu shot today Take Spiriva the day of surgery  Pulmonary hypertension: Because you have this and have experienced sleepiness recently during the daytime I am concerned that you have sleep apnea We will arrange for a home sleep study and call you with the results  Upcoming surgery: As stated today at your risk of having perioperative pulmonary complication should be less than 5% if you are feeling well prior to surgery It's OK from my perspective for you to proceed with surgery  Cigarette smoking: Stop smoking Continue follow-up with a lung cancer screening program  We will see you back in 3 months or sooner if needed

## 2017-08-02 ENCOUNTER — Other Ambulatory Visit: Payer: Self-pay | Admitting: Cardiology

## 2017-08-06 ENCOUNTER — Other Ambulatory Visit: Payer: Self-pay | Admitting: Cardiology

## 2017-08-07 NOTE — Progress Notes (Signed)
Please place orders in Epic as patient is being scheduled for a pre-op appointment! Thank you! 

## 2017-08-08 ENCOUNTER — Other Ambulatory Visit (INDEPENDENT_AMBULATORY_CARE_PROVIDER_SITE_OTHER): Payer: Self-pay | Admitting: Physician Assistant

## 2017-08-08 NOTE — Patient Instructions (Addendum)
[name]  08/08/2017   Your procedure is scheduled on: Friday 08-22-17  Report to [facility] Main  Entrance             Report to admitting at 1200 pm  Call this number if you have problems the morning of surgery [phone]   Remember: ONLY 1 PERSON MAY GO WITH YOU TO SHORT STAY TO GET  READY MORNING OF [place].  Do not eat food  :After Midnight.clear liquids from midnight until 830am, then nothting by mouth after 830 am day of surgery.     Take these medicines the morning of surgery with A SIP OF WATER: AMLODIPINE (NORVASC), ATORVASTATIN(LIPOTOR), BYSTOLIC,  OMEPRAZOLE (PRILOSEC), SERTRALINE (ZOLOFT), SPIRIVA                                You may not have any metal on your body including hair pins and              piercings  Do not wear jewelry, make-up, lotions, powders or perfumes, deodorant             Do not wear nail polish.  Do not shave  48 hours prior to surgery.            .   Do not bring valuables to the hospital. [place].  Contacts, dentures or bridgework may not be worn into surgery.  Leave suitcase in the car. After surgery it may be brought to your room.                Please read over the following fact sheets you were given: _____________________________________________________________________    CLEAR LIQUID DIET   Foods Allowed                                                                     Foods Excluded  Coffee and tea, regular and decaf                             liquids that you cannot  Plain Jell-O in any flavor                                             see through such as: Fruit ices (not with fruit pulp)                                     milk, soups, orange juice  Iced Popsicles                                    All solid food Carbonated beverages, regular and diet  Cranberry, grape and apple juices Sports drinks  like Gatorade Lightly seasoned clear broth or consume(fat free) Sugar, honey syrup  _____________________________________________________________________            [facility] - Preparing for Surgery Before surgery, you can play an important role.  Because skin is not sterile, your skin needs to be as free of germs as possible.  You can reduce the number of germs on your skin by washing with CHG (chlorahexidine gluconate) soap before surgery.  CHG is an antiseptic cleaner which kills germs and bonds with the skin to continue killing germs even after washing. Please DO NOT use if you have an allergy to CHG or antibacterial soaps.  If your skin becomes reddened/irritated stop using the CHG and inform your nurse when you arrive at Short Stay. Do not shave (including legs and underarms) for at least 48 hours prior to the first CHG shower.  You may shave your face/neck. Please follow these instructions carefully:  1.  Shower with CHG Soap the night before surgery and the  morning of Surgery.  2.  If you choose to wash your hair, wash your hair first as usual with your  normal  shampoo.  3.  After you shampoo, rinse your hair and body thoroughly to remove the  shampoo.                           4.  Use CHG as you would any other liquid soap.  You can apply chg directly  to the skin and wash                       Gently with a scrungie or clean washcloth.  5.  Apply the CHG Soap to your body ONLY FROM THE NECK DOWN.   Do not use on face/ open                           Wound or open sores. Avoid contact with eyes, ears mouth and genitals (private parts).                       Wash face,  Genitals (private parts) with your normal soap.             6.  Wash thoroughly, paying special attention to the area where your surgery  will be performed.  7.  Thoroughly rinse your body with warm water from the neck down.  8.  DO NOT shower/wash with your normal soap after using and rinsing off  the CHG Soap.                 9.  Pat yourself dry with a clean towel.            10.  Wear clean pajamas.            11.  Place clean sheets on your bed the night of your first shower and do not  sleep with pets. Day of Surgery : Do not apply any lotions/deodorants the morning of surgery.  Please wear clean clothes to the hospital/surgery center.  FAILURE TO FOLLOW THESE INSTRUCTIONS MAY RESULT IN THE CANCELLATION OF YOUR SURGERY PATIENT SIGNATURE_________________________________  NURSE SIGNATURE__________________________________  ________________________________________________________________________  WHAT IS A BLOOD TRANSFUSION? Blood Transfusion Information  A transfusion is the replacement of blood or some of its parts. Blood is made  up of multiple cells which provide different functions.  Red blood cells carry oxygen and are used for blood loss replacement.  White blood cells fight against infection.  Platelets control bleeding.  Plasma helps clot blood.  Other blood products are available for specialized needs, such as hemophilia or other clotting disorders. BEFORE THE TRANSFUSION  Who gives blood for transfusions?   Healthy volunteers who are fully evaluated to make sure their blood is safe. This is blood bank blood. Transfusion therapy is the safest it has ever been in the practice of medicine. Before blood is taken from a donor, a complete history is taken to make sure that person has no history of diseases nor engages in risky social behavior (examples are intravenous drug use or sexual activity with multiple partners). The donor's travel history is screened to minimize risk of transmitting infections, such as malaria. The donated blood is tested for signs of infectious diseases, such as HIV and hepatitis. The blood is then tested to be sure it is compatible with you in order to minimize the chance of a transfusion reaction. If you or a relative donates blood, this is often done in anticipation  of surgery and is not appropriate for emergency situations. It takes many days to process the donated blood. RISKS AND COMPLICATIONS Although transfusion therapy is very safe and saves many lives, the main dangers of transfusion include:   Getting an infectious disease.  Developing a transfusion reaction. This is an allergic reaction to something in the blood you were given. Every precaution is taken to prevent this. The decision to have a blood transfusion has been considered carefully by your caregiver before blood is given. Blood is not given unless the benefits outweigh the risks. AFTER THE TRANSFUSION  Right after receiving a blood transfusion, you will usually feel much better and more energetic. This is especially true if your red blood cells have gotten low (anemic). The transfusion raises the level of the red blood cells which carry oxygen, and this usually causes an energy increase.  The nurse administering the transfusion will monitor you carefully for complications. HOME CARE INSTRUCTIONS  No special instructions are needed after a transfusion. You may find your energy is better. Speak with your caregiver about any limitations on activity for underlying diseases you may have. SEEK MEDICAL CARE IF:   Your condition is not improving after your transfusion.  You develop redness or irritation at the intravenous (IV) site. SEEK IMMEDIATE MEDICAL CARE IF:  Any of the following symptoms occur over the next 12 hours:  Shaking chills.  You have a temperature by mouth above 102 F (38.9 C), not controlled by medicine.  Chest, back, or muscle pain.  People around you feel you are not acting correctly or are confused.  Shortness of breath or difficulty breathing.  Dizziness and fainting.  You get a rash or develop hives.  You have a decrease in urine output.  Your urine turns a dark color or changes to pink, red, or brown. Any of the following symptoms occur over the next 10  days:  You have a temperature by mouth above 102 F (38.9 C), not controlled by medicine.  Shortness of breath.  Weakness after normal activity.  The white part of the eye turns yellow (jaundice).  You have a decrease in the amount of urine or are urinating less often.  Your urine turns a dark color or changes to pink, red, or brown. Document Released: 09/06/2000 Document Revised: 12/02/2011 Document  Reviewed: 04/25/2008 ExitCare Patient Information 2014 [place].  _______________________________________________________________________  Adam [place]ix  An incentive spirometer is a tool that can help keep your lungs clear and active. This tool measures how well you are filling your lungs with each breath. Taking long deep breaths may help reverse or decrease the chance of developing breathing (pulmonary) problems (especially infection) following:  A long period of time when you are unable to move or be active. BEFORE THE PROCEDURE   If the spirometer includes an indicator to show your best effort, your nurse or respiratory therapist will set it to a desired goal.  If possible, sit up straight or lean slightly forward. Try not to slouch.  Hold the incentive spirometer in an upright position. INSTRUCTIONS FOR USE  1. Sit on the edge of your bed if possible, or sit up as far as you can in bed or on a chair. 2. Hold the incentive spirometer in an upright position. 3. Breathe out normally. 4. Place the mouthpiece in your mouth and seal your lips tightly around it. 5. Breathe in slowly and as deeply as possible, raising the piston or the ball toward the top of the column. 6. Hold your breath for 3-5 seconds or for as long as possible. Allow the piston or ball to fall to the bottom of the column. 7. Remove the mouthpiece from your mouth and breathe out normally. 8. Rest for a few seconds and repeat Steps 1 through 7 at least 10 times every 1-2 hours when you are awake.  Take your time and take a few normal breaths between deep breaths. 9. The spirometer may include an indicator to show your best effort. Use the indicator as a goal to work toward during each repetition. 10. After each set of 10 deep breaths, practice coughing to be sure your lungs are clear. If you have an incision (the cut made at the time of surgery), support your incision when coughing by placing a pillow or rolled up towels firmly against it. Once you are able to get out of bed, walk around indoors and cough well. You may stop using the incentive spirometer when instructed by your caregiver.  RISKS AND COMPLICATIONS  Take your time so you do not get dizzy or light-headed.  If you are in pain, you may need to take or ask for pain medication before doing incentive spirometry. It is harder to take a deep breath if you are having pain. AFTER USE  Rest and breathe slowly and easily.  It can be helpful to keep track of a log of your progress. Your caregiver can provide you with a simple table to help with this. If you are using the spirometer at home, follow these instructions: [place] IF:   You are having difficultly using the spirometer.  You have trouble using the spirometer as often as instructed.  Your pain medication is not giving enough relief while using the spirometer.  You develop fever of 100.5 F (38.1 C) or higher. SEEK IMMEDIATE MEDICAL CARE IF:   You cough up bloody sputum that had not been present before.  You develop fever of 102 F (38.9 C) or greater.  You develop worsening pain at or near the incision site. MAKE SURE YOU:   Understand these instructions.  Will watch your condition.  Will get help right away if you are not doing well or get worse. Document Released: 01/20/2007 Document Revised: 12/02/2011 Document Reviewed: 03/23/2007 ExitCare Patient Information 2014 [employer]   ________________________________________________________________________

## 2017-08-08 NOTE — Progress Notes (Signed)
CARDIAC CLEARANCE NOTE 07-15-17 Epic DR Elta Guadeloupe [facility] FOR 07-22-17 SURGERY  EKG 04-10-17 Epic STRESS TEST  ECHO 05-28-17 Epic CHEST XRAY 04-10-17 Epic

## 2017-08-11 ENCOUNTER — Encounter (HOSPITAL_COMMUNITY): Payer: Self-pay

## 2017-08-11 ENCOUNTER — Other Ambulatory Visit: Payer: Self-pay

## 2017-08-11 ENCOUNTER — Encounter (HOSPITAL_COMMUNITY)
Admission: RE | Admit: 2017-08-11 | Discharge: 2017-08-11 | Disposition: A | Discharge status: Home / Self Care | Payer: Medicare Other | Source: Ambulatory Visit | Attending: Orthopaedic Surgery | Admitting: Orthopaedic Surgery

## 2017-08-11 DIAGNOSIS — M1611 Unilateral primary osteoarthritis, right hip: Secondary | ICD-10-CM | POA: Insufficient documentation

## 2017-08-11 DIAGNOSIS — Z01812 Encounter for preprocedural laboratory examination: Secondary | ICD-10-CM | POA: Diagnosis not present

## 2017-08-11 HISTORY — DX: Depression, unspecified: F32.A

## 2017-08-11 HISTORY — DX: Major depressive disorder, single episode, unspecified: F32.9

## 2017-08-11 HISTORY — DX: Myoneural disorder, unspecified: G70.9

## 2017-08-11 HISTORY — DX: Dyspnea, unspecified: R06.00

## 2017-08-11 HISTORY — DX: Unspecified osteoarthritis, unspecified site: M19.90

## 2017-08-11 LAB — CBC
HEMATOCRIT: 32.9 % — AB (ref 36.0–46.0)
Hemoglobin: 10.2 g/dL — ABNORMAL LOW (ref 12.0–15.0)
MCH: 29.1 pg (ref 26.0–34.0)
MCHC: 31 g/dL (ref 30.0–36.0)
MCV: 93.7 fL (ref 78.0–100.0)
PLATELETS: 242 10*3/uL (ref 150–400)
RBC: 3.51 MIL/uL — AB (ref 3.87–5.11)
RDW: 15.2 % (ref 11.5–15.5)
WBC: 6.4 10*3/uL (ref 4.0–10.5)

## 2017-08-11 LAB — BASIC METABOLIC PANEL
ANION GAP: 7 (ref 5–15)
BUN: 10 mg/dL (ref 6–20)
CO2: 27 mmol/L (ref 22–32)
CREATININE: 0.61 mg/dL (ref 0.44–1.00)
Calcium: 9.4 mg/dL (ref 8.9–10.3)
Chloride: 107 mmol/L (ref 101–111)
GFR calc non Af Amer: >60 mL/min (ref >60)
GLUCOSE: 98 mg/dL (ref 65–99)
Potassium: 3.9 mmol/L (ref 3.5–5.1)
SODIUM: 141 mmol/L (ref 135–145)

## 2017-08-11 LAB — SURGICAL PCR SCREEN
MRSA, PCR: NEGATIVE
STAPHYLOCOCCUS AUREUS: NEGATIVE

## 2017-08-11 NOTE — Progress Notes (Signed)
Pulmonary clearance 07/29/17 epic

## 2017-08-11 NOTE — Progress Notes (Signed)
CBC done 08-11-17 routed to Dr. Ninfa Linden via epic.

## 2017-08-13 ENCOUNTER — Other Ambulatory Visit (INDEPENDENT_AMBULATORY_CARE_PROVIDER_SITE_OTHER): Payer: Self-pay

## 2017-08-16 ENCOUNTER — Other Ambulatory Visit: Payer: Self-pay | Admitting: Cardiology

## 2017-08-18 DIAGNOSIS — G4733 Obstructive sleep apnea (adult) (pediatric): Secondary | ICD-10-CM | POA: Diagnosis not present

## 2017-08-19 DIAGNOSIS — G4733 Obstructive sleep apnea (adult) (pediatric): Secondary | ICD-10-CM | POA: Diagnosis not present

## 2017-08-20 ENCOUNTER — Telehealth: Payer: Self-pay | Admitting: Pulmonary Disease

## 2017-08-20 ENCOUNTER — Telehealth: Payer: Self-pay

## 2017-08-20 NOTE — Telephone Encounter (Signed)
Spoke with pt, she states she is having hip surgery with Dr. Rush Farmer and wanted to know if this diagnosis would prevent her from having surgery. She states she is having surgery on Friday. BQ please advise.     08/20/17 3:11 PM  Note    ----- Message from Juanito Doom, MD sent at 08/20/2017  2:30 PM EST ----- A, Can you let her know that her home sleep study showed sleep apnea.  We need to discuss more in an OV.  Please make sure she has an OV with me or an NP in the next 2-4 weeks. Thanks,m B ----- Message ----- From: Chesley Mires, MD Sent: 08/20/2017   1:27 PM To: Juanito Doom, MD  Ruby Cola,  Home sleep study 08/18/17 >> AHI 20.4, SaO2 low 80%.  Moderate OSA.  Thanks.

## 2017-08-20 NOTE — Telephone Encounter (Signed)
Pt is aware of below message and voiced her understanding.  Nothing further needed.   Upcoming surgery: As stated today at your risk of having perioperative pulmonary complication should be less than 5% if you are feeling well prior to surgery It's OK from my perspective for you to proceed with surgery

## 2017-08-20 NOTE — Telephone Encounter (Signed)
Discussed sleep study results/recs with pt.  Pt scheduled to see BQ on 09/10/17 at 11:00.  Nothing further needed.

## 2017-08-20 NOTE — Telephone Encounter (Signed)
Yes that's fine, please refer to my last office note which addressed this.  We can fax this note to whoever needs if necessary.

## 2017-08-20 NOTE — Telephone Encounter (Signed)
-----   Message from Juanito Doom, MD sent at 08/20/2017  2:30 PM EST ----- A, Can you let her know that her home sleep study showed sleep apnea.  We need to discuss more in an OV.  Please make sure she has an OV with me or an NP in the next 2-4 weeks. Thanks,m B ----- Message ----- From: Chesley Mires, MD Sent: 08/20/2017   1:27 PM To: Juanito Doom, MD  Ruby Cola,  Home sleep study 08/18/17 >> AHI 20.4, SaO2 low 80%.  Moderate OSA.  Thanks.  [place]

## 2017-08-22 ENCOUNTER — Inpatient Hospital Stay (HOSPITAL_COMMUNITY): Payer: Medicare Other

## 2017-08-22 ENCOUNTER — Inpatient Hospital Stay (HOSPITAL_COMMUNITY): Payer: Medicare Other | Admitting: Anesthesiology

## 2017-08-22 ENCOUNTER — Other Ambulatory Visit: Payer: Self-pay | Admitting: *Deleted

## 2017-08-22 ENCOUNTER — Other Ambulatory Visit: Payer: Self-pay

## 2017-08-22 ENCOUNTER — Encounter (HOSPITAL_COMMUNITY): Payer: Self-pay | Admitting: Anesthesiology

## 2017-08-22 ENCOUNTER — Encounter (HOSPITAL_COMMUNITY)
Admission: RE | Disposition: A | Discharge status: Skilled Nursing Facility | Payer: Self-pay | Source: Ambulatory Visit | Attending: Orthopaedic Surgery

## 2017-08-22 ENCOUNTER — Inpatient Hospital Stay (HOSPITAL_COMMUNITY)
Admission: RE | Admit: 2017-08-22 | Discharge: 2017-08-25 | DRG: 470 | Disposition: A | Discharge status: Skilled Nursing Facility | Payer: Medicare Other | Source: Ambulatory Visit | Attending: Orthopaedic Surgery | Admitting: Orthopaedic Surgery

## 2017-08-22 DIAGNOSIS — F419 Anxiety disorder, unspecified: Secondary | ICD-10-CM | POA: Diagnosis present

## 2017-08-22 DIAGNOSIS — M247 Protrusio acetabuli: Secondary | ICD-10-CM | POA: Diagnosis present

## 2017-08-22 DIAGNOSIS — R400 Somnolence: Secondary | ICD-10-CM

## 2017-08-22 DIAGNOSIS — I350 Nonrheumatic aortic (valve) stenosis: Secondary | ICD-10-CM | POA: Diagnosis present

## 2017-08-22 DIAGNOSIS — Z951 Presence of aortocoronary bypass graft: Secondary | ICD-10-CM | POA: Diagnosis not present

## 2017-08-22 DIAGNOSIS — E7800 Pure hypercholesterolemia, unspecified: Secondary | ICD-10-CM | POA: Diagnosis present

## 2017-08-22 DIAGNOSIS — E785 Hyperlipidemia, unspecified: Secondary | ICD-10-CM | POA: Diagnosis present

## 2017-08-22 DIAGNOSIS — Z885 Allergy status to narcotic agent status: Secondary | ICD-10-CM | POA: Diagnosis not present

## 2017-08-22 DIAGNOSIS — Z419 Encounter for procedure for purposes other than remedying health state, unspecified: Secondary | ICD-10-CM

## 2017-08-22 DIAGNOSIS — I252 Old myocardial infarction: Secondary | ICD-10-CM | POA: Diagnosis not present

## 2017-08-22 DIAGNOSIS — F17210 Nicotine dependence, cigarettes, uncomplicated: Secondary | ICD-10-CM | POA: Diagnosis present

## 2017-08-22 DIAGNOSIS — Z96641 Presence of right artificial hip joint: Secondary | ICD-10-CM | POA: Diagnosis not present

## 2017-08-22 DIAGNOSIS — I10 Essential (primary) hypertension: Secondary | ICD-10-CM | POA: Diagnosis present

## 2017-08-22 DIAGNOSIS — M21751 Unequal limb length (acquired), right femur: Secondary | ICD-10-CM | POA: Diagnosis present

## 2017-08-22 DIAGNOSIS — K219 Gastro-esophageal reflux disease without esophagitis: Secondary | ICD-10-CM | POA: Diagnosis present

## 2017-08-22 DIAGNOSIS — I2721 Secondary pulmonary arterial hypertension: Secondary | ICD-10-CM | POA: Diagnosis present

## 2017-08-22 DIAGNOSIS — I739 Peripheral vascular disease, unspecified: Secondary | ICD-10-CM | POA: Diagnosis present

## 2017-08-22 DIAGNOSIS — Z952 Presence of prosthetic heart valve: Secondary | ICD-10-CM | POA: Diagnosis not present

## 2017-08-22 DIAGNOSIS — I2510 Atherosclerotic heart disease of native coronary artery without angina pectoris: Secondary | ICD-10-CM | POA: Diagnosis present

## 2017-08-22 DIAGNOSIS — I2582 Chronic total occlusion of coronary artery: Secondary | ICD-10-CM | POA: Diagnosis present

## 2017-08-22 DIAGNOSIS — M1611 Unilateral primary osteoarthritis, right hip: Secondary | ICD-10-CM | POA: Diagnosis present

## 2017-08-22 HISTORY — PX: TOTAL HIP ARTHROPLASTY: SHX124

## 2017-08-22 LAB — TYPE AND SCREEN
ABO/RH(D): O POS
ANTIBODY SCREEN: NEGATIVE

## 2017-08-22 SURGERY — ARTHROPLASTY, HIP, TOTAL, ANTERIOR APPROACH
Anesthesia: Monitor Anesthesia Care | Site: Hip | Laterality: Right

## 2017-08-22 MED ORDER — DEXTROSE 5 % IV SOLN: 30.0000 ug/min | INTRAVENOUS | Status: DC

## 2017-08-22 MED ORDER — ONDANSETRON HCL 4 MG/2ML IJ SOLN
INTRAMUSCULAR | Status: AC
  Filled 2017-08-22: qty 2

## 2017-08-22 MED ORDER — CALCIUM CARBONATE-VITAMIN D 500-200 MG-UNIT PO TABS
1.0000 | ORAL_TABLET | Freq: Every day | ORAL | Status: DC
  Administered 2017-08-23 – 2017-08-25 (×3): 1 via ORAL
  Filled 2017-08-22 (×3): qty 1

## 2017-08-22 MED ORDER — CALCIUM CARBONATE-VITAMIN D 600-400 MG-UNIT PO TABS: 1.0000 | ORAL_TABLET | Freq: Every day | ORAL | Status: DC

## 2017-08-22 MED ORDER — ADULT MULTIVITAMIN W/MINERALS CH
1.0000 | ORAL_TABLET | Freq: Every day | ORAL | Status: DC
Start: 2017-08-22 — End: 2017-08-25
  Administered 2017-08-22 – 2017-08-25 (×4): 1 via ORAL
  Filled 2017-08-22 (×5): qty 1

## 2017-08-22 MED ORDER — FENTANYL CITRATE (PF) 100 MCG/2ML IJ SOLN
INTRAMUSCULAR | Status: AC
  Filled 2017-08-22: qty 2

## 2017-08-22 MED ORDER — SERTRALINE HCL 100 MG PO TABS
200.0000 mg | ORAL_TABLET | Freq: Every day | ORAL | Status: DC
Start: 2017-08-23 — End: 2017-08-25
  Administered 2017-08-23 – 2017-08-25 (×3): 200 mg via ORAL
  Filled 2017-08-22 (×3): qty 2

## 2017-08-22 MED ORDER — PROPOFOL 10 MG/ML IV BOLUS
INTRAVENOUS | Status: AC
  Filled 2017-08-22: qty 20

## 2017-08-22 MED ORDER — ALUM & MAG HYDROXIDE-SIMETH 200-200-20 MG/5ML PO SUSP
30.0000 mL | ORAL | Status: DC | PRN
  Administered 2017-08-23 – 2017-08-24 (×3): 30 mL via ORAL
  Filled 2017-08-22 (×3): qty 30

## 2017-08-22 MED ORDER — ACETAMINOPHEN 650 MG RE SUPP: 650.0000 mg | RECTAL | Status: DC | PRN

## 2017-08-22 MED ORDER — PHENOL 1.4 % MT LIQD
1.0000 | OROMUCOSAL | Status: DC | PRN
  Filled 2017-08-22: qty 177

## 2017-08-22 MED ORDER — CEFAZOLIN SODIUM-DEXTROSE 2-4 GM/100ML-% IV SOLN
2.0000 g | INTRAVENOUS | Status: AC
  Administered 2017-08-22: 2 g via INTRAVENOUS

## 2017-08-22 MED ORDER — PHENYLEPHRINE HCL 10 MG/ML IJ SOLN
INTRAMUSCULAR | Status: DC | PRN
  Administered 2017-08-22: 50 ug/min via INTRAVENOUS

## 2017-08-22 MED ORDER — FENTANYL CITRATE (PF) 100 MCG/2ML IJ SOLN
INTRAMUSCULAR | Status: DC | PRN
  Administered 2017-08-22 (×2): 50 ug via INTRAVENOUS

## 2017-08-22 MED ORDER — GLYCOPYRROLATE 1 MG PO TABS
2.0000 mg | ORAL_TABLET | Freq: Two times a day (BID) | ORAL | Status: DC
  Administered 2017-08-22 – 2017-08-25 (×6): 2 mg via ORAL
  Filled 2017-08-22 (×6): qty 2

## 2017-08-22 MED ORDER — PHENYLEPHRINE HCL 10 MG/ML IJ SOLN: INTRAMUSCULAR | Status: DC | PRN

## 2017-08-22 MED ORDER — STERILE WATER FOR IRRIGATION IR SOLN
Status: DC | PRN
  Administered 2017-08-22: 2000 mL

## 2017-08-22 MED ORDER — 0.9 % SODIUM CHLORIDE (POUR BTL) OPTIME
TOPICAL | Status: DC | PRN
  Administered 2017-08-22: 1000 mL

## 2017-08-22 MED ORDER — FERROUS SULFATE 325 (65 FE) MG PO TABS
325.0000 mg | ORAL_TABLET | Freq: Every day | ORAL | Status: DC
  Administered 2017-08-23 – 2017-08-25 (×3): 325 mg via ORAL
  Filled 2017-08-22 (×3): qty 1

## 2017-08-22 MED ORDER — PROPOFOL 500 MG/50ML IV EMUL
INTRAVENOUS | Status: DC | PRN
  Administered 2017-08-22: 100 ug/kg/min via INTRAVENOUS

## 2017-08-22 MED ORDER — LORAZEPAM 0.5 MG PO TABS: 0.5000 mg | ORAL_TABLET | Freq: Every evening | ORAL | Status: DC | PRN

## 2017-08-22 MED ORDER — ATORVASTATIN CALCIUM 40 MG PO TABS
80.0000 mg | ORAL_TABLET | Freq: Every day | ORAL | Status: DC
  Administered 2017-08-23 – 2017-08-25 (×3): 80 mg via ORAL
  Filled 2017-08-22 (×3): qty 2

## 2017-08-22 MED ORDER — FENTANYL CITRATE (PF) 100 MCG/2ML IJ SOLN
25.0000 ug | INTRAMUSCULAR | Status: DC | PRN
  Administered 2017-08-22 (×2): 50 ug via INTRAVENOUS

## 2017-08-22 MED ORDER — HYDROCODONE-ACETAMINOPHEN 5-325 MG PO TABS
1.0000 | ORAL_TABLET | ORAL | Status: DC | PRN
  Administered 2017-08-22: 1 via ORAL
  Administered 2017-08-23 – 2017-08-25 (×5): 2 via ORAL
  Filled 2017-08-22: qty 1
  Filled 2017-08-22 (×5): qty 2

## 2017-08-22 MED ORDER — PHENYLEPHRINE 40 MCG/ML (10ML) SYRINGE FOR IV PUSH (FOR BLOOD PRESSURE SUPPORT)
PREFILLED_SYRINGE | INTRAVENOUS | Status: AC
  Filled 2017-08-22: qty 10

## 2017-08-22 MED ORDER — OXYCODONE HCL 5 MG PO TABS
5.0000 mg | ORAL_TABLET | ORAL | Status: DC | PRN
  Administered 2017-08-22 – 2017-08-25 (×8): 5 mg via ORAL
  Filled 2017-08-22 (×8): qty 1

## 2017-08-22 MED ORDER — DIPHENHYDRAMINE HCL 12.5 MG/5ML PO ELIX: 12.5000 mg | ORAL_SOLUTION | ORAL | Status: DC | PRN

## 2017-08-22 MED ORDER — HYDROMORPHONE HCL 1 MG/ML IJ SOLN
0.5000 mg | INTRAMUSCULAR | Status: DC | PRN
  Administered 2017-08-23: 04:00:00 0.5 mg via INTRAVENOUS
  Filled 2017-08-22: qty 0.5

## 2017-08-22 MED ORDER — METHOCARBAMOL 1000 MG/10ML IJ SOLN
500.0000 mg | Freq: Four times a day (QID) | INTRAVENOUS | Status: DC | PRN
  Administered 2017-08-22: 500 mg via INTRAVENOUS
  Filled 2017-08-22: qty 550

## 2017-08-22 MED ORDER — TIOTROPIUM BROMIDE MONOHYDRATE 18 MCG IN CAPS
18.0000 ug | ORAL_CAPSULE | Freq: Every morning | RESPIRATORY_TRACT | Status: DC
  Administered 2017-08-23 – 2017-08-25 (×3): 18 ug via RESPIRATORY_TRACT
  Filled 2017-08-22: qty 5

## 2017-08-22 MED ORDER — FUROSEMIDE 20 MG PO TABS
20.0000 mg | ORAL_TABLET | Freq: Every day | ORAL | Status: DC
  Administered 2017-08-22 – 2017-08-25 (×4): 20 mg via ORAL
  Filled 2017-08-22 (×3): qty 1

## 2017-08-22 MED ORDER — NEBIVOLOL HCL 5 MG PO TABS
5.0000 mg | ORAL_TABLET | Freq: Every day | ORAL | Status: DC
  Administered 2017-08-23 – 2017-08-25 (×2): 5 mg via ORAL
  Filled 2017-08-22 (×3): qty 1

## 2017-08-22 MED ORDER — ONDANSETRON HCL 4 MG/2ML IJ SOLN
INTRAMUSCULAR | Status: DC | PRN
  Administered 2017-08-22: 4 mg via INTRAVENOUS

## 2017-08-22 MED ORDER — MIDAZOLAM HCL 2 MG/2ML IJ SOLN
INTRAMUSCULAR | Status: AC
  Filled 2017-08-22: qty 2

## 2017-08-22 MED ORDER — PANTOPRAZOLE SODIUM 40 MG PO TBEC
40.0000 mg | DELAYED_RELEASE_TABLET | Freq: Every day | ORAL | Status: DC
  Administered 2017-08-23 – 2017-08-25 (×3): 40 mg via ORAL
  Filled 2017-08-22 (×3): qty 1

## 2017-08-22 MED ORDER — ONDANSETRON HCL 4 MG PO TABS: 4.0000 mg | ORAL_TABLET | Freq: Four times a day (QID) | ORAL | Status: DC | PRN

## 2017-08-22 MED ORDER — PHENYLEPHRINE 40 MCG/ML (10ML) SYRINGE FOR IV PUSH (FOR BLOOD PRESSURE SUPPORT)
PREFILLED_SYRINGE | INTRAVENOUS | Status: DC | PRN
  Administered 2017-08-22: 40 ug via INTRAVENOUS

## 2017-08-22 MED ORDER — MIDAZOLAM HCL 5 MG/5ML IJ SOLN
INTRAMUSCULAR | Status: DC | PRN
  Administered 2017-08-22: 2 mg via INTRAVENOUS

## 2017-08-22 MED ORDER — OXYCODONE HCL 5 MG/5ML PO SOLN
5.0000 mg | Freq: Once | ORAL | Status: DC | PRN
  Filled 2017-08-22: qty 5

## 2017-08-22 MED ORDER — LIDOCAINE 2% (20 MG/ML) 5 ML SYRINGE
INTRAMUSCULAR | Status: AC
  Filled 2017-08-22: qty 5

## 2017-08-22 MED ORDER — METOCLOPRAMIDE HCL 5 MG/ML IJ SOLN: 5.0000 mg | Freq: Three times a day (TID) | INTRAMUSCULAR | Status: DC | PRN

## 2017-08-22 MED ORDER — METHOCARBAMOL 500 MG PO TABS
500.0000 mg | ORAL_TABLET | Freq: Four times a day (QID) | ORAL | Status: DC | PRN
  Administered 2017-08-23 – 2017-08-24 (×3): 500 mg via ORAL
  Filled 2017-08-22 (×3): qty 1

## 2017-08-22 MED ORDER — DEXAMETHASONE SODIUM PHOSPHATE 10 MG/ML IJ SOLN
INTRAMUSCULAR | Status: AC
  Filled 2017-08-22: qty 1

## 2017-08-22 MED ORDER — CEFAZOLIN SODIUM-DEXTROSE 1-4 GM/50ML-% IV SOLN
1.0000 g | Freq: Four times a day (QID) | INTRAVENOUS | Status: AC
  Administered 2017-08-22 – 2017-08-23 (×2): 1 g via INTRAVENOUS
  Filled 2017-08-22 (×2): qty 50

## 2017-08-22 MED ORDER — ONDANSETRON HCL 4 MG/2ML IJ SOLN: 4.0000 mg | Freq: Four times a day (QID) | INTRAMUSCULAR | Status: DC | PRN

## 2017-08-22 MED ORDER — SODIUM CHLORIDE 0.9 % IR SOLN
Status: DC | PRN
  Administered 2017-08-22: 1000 mL

## 2017-08-22 MED ORDER — LACTATED RINGERS IV SOLN
INTRAVENOUS | Status: DC
  Administered 2017-08-22 (×2): via INTRAVENOUS

## 2017-08-22 MED ORDER — AMLODIPINE BESYLATE 10 MG PO TABS
10.0000 mg | ORAL_TABLET | Freq: Every day | ORAL | Status: DC
  Administered 2017-08-23 – 2017-08-25 (×2): 10 mg via ORAL
  Filled 2017-08-22 (×3): qty 1

## 2017-08-22 MED ORDER — PHENYLEPHRINE HCL 10 MG/ML IJ SOLN
INTRAMUSCULAR | Status: AC
  Filled 2017-08-22: qty 1

## 2017-08-22 MED ORDER — OXYCODONE HCL 5 MG PO TABS: 5.0000 mg | ORAL_TABLET | Freq: Once | ORAL | Status: DC | PRN

## 2017-08-22 MED ORDER — BUPIVACAINE IN DEXTROSE 0.75-8.25 % IT SOLN
INTRATHECAL | Status: DC | PRN
  Administered 2017-08-22: 1.6 mL via INTRATHECAL

## 2017-08-22 MED ORDER — TRANEXAMIC ACID 1000 MG/10ML IV SOLN
1000.0000 mg | INTRAVENOUS | Status: AC
  Administered 2017-08-22: 1000 mg via INTRAVENOUS
  Filled 2017-08-22: qty 1100

## 2017-08-22 MED ORDER — CEFAZOLIN SODIUM-DEXTROSE 2-4 GM/100ML-% IV SOLN
INTRAVENOUS | Status: AC
  Filled 2017-08-22: qty 100

## 2017-08-22 MED ORDER — BISACODYL 10 MG RE SUPP: 10.0000 mg | Freq: Every day | RECTAL | Status: DC | PRN

## 2017-08-22 MED ORDER — METOCLOPRAMIDE HCL 5 MG PO TABS: 5.0000 mg | ORAL_TABLET | Freq: Three times a day (TID) | ORAL | Status: DC | PRN

## 2017-08-22 MED ORDER — MENTHOL 3 MG MT LOZG: 1.0000 | LOZENGE | OROMUCOSAL | Status: DC | PRN

## 2017-08-22 MED ORDER — POTASSIUM CHLORIDE CRYS ER 10 MEQ PO TBCR
10.0000 meq | EXTENDED_RELEASE_TABLET | Freq: Every day | ORAL | Status: DC
  Administered 2017-08-22 – 2017-08-25 (×4): 10 meq via ORAL
  Filled 2017-08-22 (×3): qty 1

## 2017-08-22 MED ORDER — ASPIRIN EC 325 MG PO TBEC
325.0000 mg | DELAYED_RELEASE_TABLET | Freq: Every day | ORAL | Status: DC
  Administered 2017-08-23 – 2017-08-25 (×3): 325 mg via ORAL
  Filled 2017-08-22 (×3): qty 1

## 2017-08-22 MED ORDER — DOCUSATE SODIUM 100 MG PO CAPS
100.0000 mg | ORAL_CAPSULE | Freq: Two times a day (BID) | ORAL | Status: DC
  Administered 2017-08-22 – 2017-08-25 (×5): 100 mg via ORAL
  Filled 2017-08-22 (×5): qty 1

## 2017-08-22 MED ORDER — FENTANYL CITRATE (PF) 100 MCG/2ML IJ SOLN
INTRAMUSCULAR | Status: AC
Start: 2017-08-22 — End: 2017-08-23
  Filled 2017-08-22: qty 2

## 2017-08-22 MED ORDER — ACETAMINOPHEN 325 MG PO TABS
650.0000 mg | ORAL_TABLET | ORAL | Status: DC | PRN
  Administered 2017-08-24 (×2): 650 mg via ORAL
  Filled 2017-08-22 (×2): qty 2

## 2017-08-22 MED ORDER — CHLORHEXIDINE GLUCONATE 4 % EX LIQD: 60.0000 mL | Freq: Once | CUTANEOUS | Status: DC

## 2017-08-22 MED ORDER — SODIUM CHLORIDE 0.9 % IV SOLN
INTRAVENOUS | Status: DC
  Administered 2017-08-22: 19:00:00 via INTRAVENOUS

## 2017-08-22 SURGICAL SUPPLY — 37 items
BAG SPEC THK2 15X12 ZIP CLS (MISCELLANEOUS) ×1
BAG ZIPLOCK 12X15 (MISCELLANEOUS) ×2 IMPLANT
BLADE SAW SGTL 18X1.27X75 (BLADE) ×2 IMPLANT
BLADE SAW SGTL 18X1.27X75MM (BLADE) ×1
CAPT HIP TOTAL 2 ×2 IMPLANT
CELLS DAT CNTRL 66122 CELL SVR (MISCELLANEOUS) IMPLANT
COVER PERINEAL POST (MISCELLANEOUS) ×3 IMPLANT
COVER SURGICAL LIGHT HANDLE (MISCELLANEOUS) ×3 IMPLANT
DRAPE STERI IOBAN 125X83 (DRAPES) ×3 IMPLANT
DRAPE U-SHAPE 47X51 STRL (DRAPES) ×6 IMPLANT
DRESSING AQUACEL AG SP 3.5X10 (GAUZE/BANDAGES/DRESSINGS) IMPLANT
DRSG AQUACEL AG SP 3.5X10 (GAUZE/BANDAGES/DRESSINGS) ×3
DURAPREP 26ML APPLICATOR (WOUND CARE) ×3 IMPLANT
ELECT REM PT RETURN 15FT ADLT (MISCELLANEOUS) ×3 IMPLANT
GAUZE XEROFORM 1X8 LF (GAUZE/BANDAGES/DRESSINGS) ×2 IMPLANT
GLOVE BIO SURGEON STRL SZ7.5 (GLOVE) ×3 IMPLANT
GLOVE BIOGEL PI IND STRL 7.5 (GLOVE) IMPLANT
GLOVE BIOGEL PI IND STRL 8 (GLOVE) ×2 IMPLANT
GLOVE BIOGEL PI INDICATOR 7.5 (GLOVE) ×10
GLOVE BIOGEL PI INDICATOR 8 (GLOVE) ×4
GLOVE ECLIPSE 8.0 STRL XLNG CF (GLOVE) ×3 IMPLANT
GLOVE SURG SS PI 7.5 STRL IVOR (GLOVE) ×2 IMPLANT
GOWN STRL REUS W/TWL XL LVL3 (GOWN DISPOSABLE) ×10 IMPLANT
HANDPIECE INTERPULSE COAX TIP (DISPOSABLE) ×3
HOLDER FOLEY CATH W/STRAP (MISCELLANEOUS) ×3 IMPLANT
PACK ANTERIOR HIP CUSTOM (KITS) ×3 IMPLANT
RETRACTOR WND ALEXIS 18 MED (MISCELLANEOUS) IMPLANT
RTRCTR WOUND ALEXIS 18CM MED (MISCELLANEOUS)
SET HNDPC FAN SPRY TIP SCT (DISPOSABLE) ×1 IMPLANT
STAPLER VISISTAT 35W (STAPLE) ×2 IMPLANT
SUT ETHIBOND NAB CT1 #1 30IN (SUTURE) ×3 IMPLANT
SUT VIC AB 0 CT1 36 (SUTURE) ×3 IMPLANT
SUT VIC AB 1 CT1 36 (SUTURE) ×3 IMPLANT
SUT VIC AB 2-0 CT1 27 (SUTURE) ×6
SUT VIC AB 2-0 CT1 TAPERPNT 27 (SUTURE) ×2 IMPLANT
TRAY FOLEY CATH SILVER 14FR (SET/KITS/TRAYS/PACK) ×2 IMPLANT
YANKAUER SUCT BULB TIP 10FT TU (MISCELLANEOUS) ×3 IMPLANT

## 2017-08-22 NOTE — Anesthesia Preprocedure Evaluation (Signed)
Anesthesia Evaluation  Patient identified by MRN, date of birth, ID band Patient awake    Reviewed: Allergy & Precautions, NPO status , Patient's Chart, lab work & pertinent test results, reviewed documented beta blocker date and time   History of Anesthesia Complications Negative for: history of anesthetic complications  Airway Mallampati: II  TM Distance: >3 FB Neck ROM: Full    Dental  (+) Teeth Intact, Partial Upper   Pulmonary shortness of breath and with exertion, neg sleep apnea, neg COPD, neg recent URI, Current Smoker,    breath sounds clear to auscultation       Cardiovascular hypertension, Pt. on medications and Pt. on home beta blockers (-) angina+ CAD, + Past MI and + CABG  + Valvular Problems/Murmurs AS  Rhythm:Regular + Systolic murmurs    Neuro/Psych neg Seizures PSYCHIATRIC DISORDERS Anxiety Depression    GI/Hepatic Neg liver ROS, GERD  Controlled,  Endo/Other    Renal/GU negative Renal ROS     Musculoskeletal  (+) Arthritis ,   Abdominal   Peds  Hematology  (+) anemia ,   Anesthesia Other Findings Moderate AS  Reproductive/Obstetrics                             Anesthesia Physical Anesthesia Plan  ASA: III  Anesthesia Plan: Spinal and MAC   Post-op Pain Management:    Induction:   PONV Risk Score and Plan: 1 and Ondansetron  Airway Management Planned: Nasal Cannula  Additional Equipment: None  Intra-op Plan:   Post-operative Plan:   Informed Consent: I have reviewed the patients History and Physical, chart, labs and discussed the procedure including the risks, benefits and alternatives for the proposed anesthesia with the patient or authorized representative who has indicated his/her understanding and acceptance.   Dental advisory given  Plan Discussed with: Surgeon and CRNA  Anesthesia Plan Comments:         Anesthesia Quick Evaluation

## 2017-08-22 NOTE — Transfer of Care (Signed)
Immediate Anesthesia Transfer of Care Note  Patient: [name]  Procedure(s) Performed: RIGHT TOTAL HIP ARTHROPLASTY ANTERIOR APPROACH (Right Hip)  Patient Location: PACU  Anesthesia Type:Spinal  Level of Consciousness: awake and alert   Airway & Oxygen Therapy: Patient Spontanous Breathing and Patient connected to face mask oxygen  Post-op Assessment: Report given to RN and Post -op Vital signs reviewed and stable  Post vital signs: Reviewed and stable  Last Vitals:  Vitals:   08/22/17 1242  BP: (!) 100/58  Pulse: 71  Resp: 18  Temp: 36.7 C  SpO2: 99%    Last Pain:  Vitals:   08/22/17 1248  TempSrc:   PainSc: 5       Patients Stated Pain Goal: 5 (18/55/01 5868)  Complications: No apparent anesthesia complications

## 2017-08-22 NOTE — Anesthesia Postprocedure Evaluation (Signed)
Anesthesia Post Note  Patient: [name]  Procedure(s) Performed: RIGHT TOTAL HIP ARTHROPLASTY ANTERIOR APPROACH (Right Hip)     Patient location during evaluation: PACU Anesthesia Type: MAC and Spinal Level of consciousness: awake and alert Pain management: pain level controlled Vital Signs Assessment: post-procedure vital signs reviewed and stable Respiratory status: spontaneous breathing, nonlabored ventilation, respiratory function stable and patient connected to nasal cannula oxygen Cardiovascular status: stable and blood pressure returned to baseline Postop Assessment: no apparent nausea or vomiting and spinal receding Anesthetic complications: no    Last Vitals:  Vitals:   08/22/17 1715 08/22/17 1730  BP: 99/60 (!) 97/56  Pulse: (!) 59 64  Resp: 13 16  Temp: 36.7 C 36.7 C  SpO2: 92% 96%    Last Pain:  Vitals:   08/22/17 1715  TempSrc:   PainSc: Asleep                 Tomoki Lucken

## 2017-08-22 NOTE — Brief Op Note (Signed)
08/22/2017  3:34 PM  PATIENT:  [name]  76 y.o. female  PRE-OPERATIVE DIAGNOSIS:  right hip osteoarthritis  POST-OPERATIVE DIAGNOSIS:  right hip osteoarthritis  PROCEDURE:  Procedure(s): RIGHT TOTAL HIP ARTHROPLASTY ANTERIOR APPROACH (Right)  SURGEON:  Surgeon(s) and Role:    Mcarthur Rossetti, MD - Primary  PHYSICIAN ASSISTANT: Benita Stabile, PA-C  ANESTHESIA:   spinal  EBL:  350 mL   COUNTS:  YES  DICTATION: .Other Dictation: Dictation Number [id number]  PLAN OF CARE: Admit to inpatient   PATIENT DISPOSITION:  PACU - hemodynamically stable.   Delay start of Pharmacological VTE agent (>24hrs) due to surgical blood loss or risk of bleeding: no

## 2017-08-22 NOTE — Plan of Care (Signed)
df

## 2017-08-22 NOTE — Anesthesia Procedure Notes (Signed)
Spinal  Patient location during procedure: OR Start time: 08/22/2017 1:54 PM End time: 08/22/2017 2:00 PM Staffing Resident/CRNA: Sharlette Dense, CRNA Performed: resident/CRNA  Preanesthetic Checklist Completed: patient identified, site marked, surgical consent, pre-op evaluation, timeout performed, IV checked, risks and benefits discussed and monitors and equipment checked Spinal Block Patient position: sitting Prep: Betadine Patient monitoring: heart rate, continuous pulse ox and blood pressure Approach: midline Location: L3-4 Injection technique: single-shot Needle Needle type: Sprotte  Needle gauge: 24 G Needle length: 9 cm Assessment Sensory level: T6 Additional Notes Kit expiration date and lot number checked Clear free flow of CSF, negative heme, negative paresthesia Tolerated well and returned to supine position

## 2017-08-22 NOTE — Anesthesia Procedure Notes (Signed)
Date/Time: 08/22/2017 1:52 PM Performed by: Sharlette Dense, CRNA Oxygen Delivery Method: Simple face mask

## 2017-08-22 NOTE — H&P (Signed)
TOTAL HIP ADMISSION H&P  Patient is admitted for right total hip arthroplasty.  Subjective:  Chief Complaint: right hip pain  HPI: [name], 76 y.o. female, has a history of pain and functional disability in the right hip(s) due to arthritis and patient has failed non-surgical conservative treatments for greater than 12 weeks to include NSAID's and/or analgesics, corticosteriod injections, flexibility and strengthening excercises, supervised PT with diminished ADL's post treatment, use of assistive devices and activity modification.  Onset of symptoms was gradual starting 3 years ago with gradually worsening course since that time.The patient noted no past surgery on the right hip(s).  Patient currently rates pain in the right hip at 10 out of 10 with activity. Patient has night pain, worsening of pain with activity and weight bearing, trendelenberg gait, pain that interfers with activities of daily living and pain with passive range of motion. Patient has evidence of subchondral cysts, subchondral sclerosis, periarticular osteophytes and joint space narrowing by imaging studies. This condition presents safety issues increasing the risk of falls.  There is no current active infection.  Patient Active Problem List   Diagnosis Date Noted  . 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 Symptomatic anemia 04/10/2017  . Generalized weakness   . Heme positive stool   . PAH (pulmonary artery hypertension) ([place])   . Epigastric burning sensation 04/19/2015  . Chronic diarrhea 04/19/2015  . History of GI bleed 03/14/2015  . Long-term use of high-risk medication 01/11/2015  . Dysphagia 01/11/2015  . S/P AVR 01/11/2015  . Dyspnea 01/11/2015  . Tobacco abuse 01/11/2015  . Numbness of anterior thigh-Left 12/09/2014  . Pain in joint, lower leg 12/09/2014  . Essential hypertension 07/14/2014  . Coronary artery disease involving coronary bypass graft of native heart without angina  pectoris 07/14/2014  . Dilated aortic root ([place]) 07/14/2014  . H/O aortic valve replacement 07/14/2014  . Tobacco use 07/14/2014  . Obesity 07/14/2014  . Palpitations 01/03/2014  . Aortic valve disorder 06/19/2013  . Heart valve replaced by other means 06/19/2013  . Flatulence, eructation, and gas pain 06/19/2013  . Pure hypercholesterolemia 06/19/2013  . 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 Hypopotassemia 06/19/2013  . Anemia, unspecified 06/19/2013  . Anxiety state, unspecified 06/19/2013  . Esophageal reflux 06/19/2013  . Personal history of other diseases of digestive system 06/19/2013  . Iron deficiency anemia, unspecified 06/19/2013  . Proteinuria 06/19/2013  . Arthropathy, unspecified, site unspecified 06/19/2013  . Major depressive disorder, single episode, unspecified 06/19/2013  . 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 Tobacco use disorder 06/19/2013  . Peripheral vascular disease ([place]) 10/14/2012  . Atherosclerosis of aorta ([place]) 10/14/2012  . Pain in limb 10/14/2012  . Atherosclerosis of abdominal aorta ([place]) 10/16/2011  . PVD (peripheral vascular disease) ([place]) 10/16/2011   Past Medical History:  Diagnosis Date  . Abdominal pain, unspecified site   . Anemia    Iron deficiency anemia  . Anxiety   . Aortic stenosis   . Arthritis   . CAD (coronary artery disease)    psot bypass x2 to diagonal and obtuse marginal. SVG graft-01/08/2009  . Depression   . Dyspnea   . GERD (gastroesophageal reflux disease)   . History of cardiovascular stress test    Myoview 7/16:  Low risk, no ischemia or scar, EF 72%  . History of echocardiogram    Echo 7/16:  EF 55-60%, no RWMA, Gr 2 DD, AVR ok (mean 20 mmHg), PASP 40 mmHg  .  History of GI bleed    With AVM  . Hypercholesterolemia   . Hyperlipidemia   . Hypertension   . Iron deficiency anemia   . Myocardial infarction ([place]) 2010  . Neuromuscular disorder ([facility])    numbness in hands  . Peripheral  vascular disease ([place])    righ tleg  . Shoulder impingement syndrome    Right shoulder    Past Surgical History:  Procedure Laterality Date  . ABDOMINAL HYSTERECTOMY    . AORTIC VALVE REPLACEMENT     [facility] #[id number]  . CARDIAC CATHETERIZATION N/A 08/11/2015   Procedure: Right Heart Cath;  Surgeon: Jolaine Artist, MD;  Location: [facility];  Service: Cardiovascular;  Laterality: N/A;  . CARDIAC SURGERY  2010   , 2 blockages  . CHOLECYSTECTOMY    . COLONOSCOPY N/A 04/12/2017   Procedure: COLONOSCOPY;  Surgeon: Manus Gunning, MD;  Location: [facility];  Service: Gastroenterology;  Laterality: N/A;  . CORONARY ARTERY BYPASS GRAFT    . ESOPHAGOGASTRODUODENOSCOPY N/A 04/12/2017   Procedure: ESOPHAGOGASTRODUODENOSCOPY (EGD);  Surgeon: Manus Gunning, MD;  Location: [facility];  Service: Gastroenterology;  Laterality: N/A;  . HEEL SPUR EXCISION  2000   left heel  . SPINE SURGERY  2003   Herniated diskectomy  . WRIST SURGERY     right wrist    Current Facility-Administered Medications  Medication Dose Route Frequency Provider Last Rate Last Dose  . ceFAZolin (ANCEF) 2-4 GM/100ML-% IVPB           . [START ON 08/23/2017] ceFAZolin (ANCEF) IVPB 2g/100 mL premix  2 g Intravenous On Call to OR Pete Pelt, PA-C      . chlorhexidine (HIBICLENS) 4 % liquid 4 application  60 mL Topical Once Pete Pelt, PA-C      . lactated ringers infusion   Intravenous Continuous Oleta Mouse, MD 75 mL/hr at 08/22/17 1258    . tranexamic acid (CYKLOKAPRON) 1,000 mg in sodium chloride 0.9 % 100 mL IVPB  1,000 mg Intravenous To OR Pete Pelt, PA-C       Allergies  Allergen Reactions  . Codeine Other (See Comments)    Feeling intoxicated    Social History   Tobacco Use  . Smoking status: Current Some Day Smoker    Packs/day: 1.00    Years: 52.00    Pack years: 52.00    Types: Cigarettes    Last attempt to quit: 01/21/2017    Years since quitting: 0.5  .  Smokeless tobacco: Never Used  . Tobacco comment: "might borrow a cigarette from time to time"  Substance Use Topics  . Alcohol use: No    Alcohol/week: 0.0 oz    Family History  Problem Relation Age of Onset  . Hypertension Mother   . Kidney disease Mother   . Hypertension Father   . Hypertension Sister   . Hypertension Brother   . Hypertension Daughter   . Diabetes Brother   . Heart attack Neg Hx   . Stroke Neg Hx      Review of Systems  Musculoskeletal: Positive for back pain and joint pain.  All other systems reviewed and are negative.   Objective:  Physical Exam  Constitutional: [name] is oriented to person, place, and time. [name] appears well-developed and well-nourished.  HENT:  Head: Normocephalic and atraumatic.  Eyes: EOM are normal. Pupils are equal, round, and reactive to light.  Neck: Normal range of motion. Neck supple.  Cardiovascular: Normal rate and  regular rhythm.  Respiratory: Effort normal and breath sounds normal.  GI: Soft. Bowel sounds are normal.  Musculoskeletal:       Right hip: [name] exhibits decreased range of motion, decreased strength, tenderness and bony tenderness.  Neurological: [name] is alert and oriented to person, place, and time.  Skin: Skin is warm and dry.  Psychiatric: [name] has a normal mood and affect.    Vital signs in last 24 hours: Temp:  [98 F (36.7 C)] 98 F (36.7 C) (11/30 1242) Pulse Rate:  [71] 71 (11/30 1242) Resp:  [18] 18 (11/30 1242) BP: (100)/(58) 100/58 (11/30 1242) SpO2:  [99 %] 99 % (11/30 1242) Weight:  [150 lb (68 kg)] 150 lb (68 kg) (11/30 1248)  Labs:   Estimated body mass index is 32.46 kg/m as calculated from the following:   Height as of this encounter: 4\' 9"  (1.448 m).   Weight as of this encounter: 150 lb (68 kg).   Imaging Review Plain radiographs demonstrate severe degenerative joint disease of the right hip(s). The bone quality appears to be good for age and reported activity  level.  Assessment/Plan:  End stage arthritis, right hip(s)  The patient history, physical examination, clinical judgement of the provider and imaging studies are consistent with end stage degenerative joint disease of the right hip(s) and total hip arthroplasty is deemed medically necessary. The treatment options including medical management, injection therapy, arthroscopy and arthroplasty were discussed at length. The risks and benefits of total hip arthroplasty were presented and reviewed. The risks due to aseptic loosening, infection, stiffness, dislocation/subluxation,  thromboembolic complications and other imponderables were discussed.  The patient acknowledged the explanation, agreed to proceed with the plan and consent was signed. Patient is being admitted for inpatient treatment for surgery, pain control, PT, OT, prophylactic antibiotics, VTE prophylaxis, progressive ambulation and ADL's and discharge planning.The patient is planning to be discharged home with home health services

## 2017-08-23 LAB — BASIC METABOLIC PANEL
ANION GAP: 7 (ref 5–15)
BUN: 9 mg/dL (ref 6–20)
CHLORIDE: 104 mmol/L (ref 101–111)
CO2: 26 mmol/L (ref 22–32)
Calcium: 8.7 mg/dL — ABNORMAL LOW (ref 8.9–10.3)
Creatinine, Ser: 0.57 mg/dL (ref 0.44–1.00)
GFR calc Af Amer: >60 mL/min (ref >60)
GLUCOSE: 140 mg/dL — AB (ref 65–99)
POTASSIUM: 3.1 mmol/L — AB (ref 3.5–5.1)
Sodium: 137 mmol/L (ref 135–145)

## 2017-08-23 LAB — CBC
HEMATOCRIT: 26.9 % — AB (ref 36.0–46.0)
Hemoglobin: 8.6 g/dL — ABNORMAL LOW (ref 12.0–15.0)
MCH: 29.4 pg (ref 26.0–34.0)
MCHC: 32 g/dL (ref 30.0–36.0)
MCV: 91.8 fL (ref 78.0–100.0)
PLATELETS: 202 10*3/uL (ref 150–400)
RBC: 2.93 MIL/uL — AB (ref 3.87–5.11)
RDW: 14.5 % (ref 11.5–15.5)
WBC: 11.6 10*3/uL — ABNORMAL HIGH (ref 4.0–10.5)

## 2017-08-23 MED ORDER — LIP MEDEX EX OINT
TOPICAL_OINTMENT | CUTANEOUS | Status: AC
  Administered 2017-08-23: 12:00:00
  Filled 2017-08-23: qty 7

## 2017-08-23 NOTE — Evaluation (Signed)
Physical Therapy Evaluation Patient Details Name: [name] MRN: [id number] DOB: 03/14/1941 Today's Date: 08/23/2017   History of Present Illness  s/p R THA  Clinical Impression  Pt is s/p THA resulting in the deficits listed below (see PT Problem List).  Pt will benefit from skilled PT to increase their independence and safety with mobility to allow discharge to the venue listed below.      Follow Up Recommendations SNF    Equipment Recommendations  Rolling walker with 5" wheels    Recommendations for Other Services       Precautions / Restrictions Precautions Precautions: Fall Restrictions Weight Bearing Restrictions: No Other Position/Activity Restrictions: WBAT      Mobility  Bed Mobility Overal bed mobility: Needs Assistance Bed Mobility: Supine to Sit     Supine to sit: Min assist     General bed mobility comments: cues for hand    Transfers Overall transfer level: Needs assistance Equipment used: Rolling walker (2 wheeled) Transfers: Sit to/from [facility] Sit to Stand: Min assist;+2 safety/equipment Stand pivot transfers: Min assist;+2 safety/equipment       General transfer comment: cues for safety and technique  Ambulation/Gait             General Gait Details: OOB to chair only d/t pain and dizziness; BP 107/58, O2 sats 88-93%, O2 replaced at 2L; HR in 90s  Stairs            Wheelchair Mobility    Modified Rankin (Stroke Patients Only)       Balance Overall balance assessment: Needs assistance         Standing balance support: Bilateral upper extremity supported Standing balance-Leahy Scale: Poor Standing balance comment: reliant on UEs                             Pertinent Vitals/Pain Pain Assessment: 0-10 Pain Score: 8  Pain Location: right hip Pain Descriptors / Indicators: Aching;Sore;Grimacing;Guarding Pain Intervention(s): Limited activity within patient's tolerance;Monitored  during session;Premedicated before session;Repositioned    Home Living Family/patient expects to be discharged to:: Private residence Living Arrangements: Alone   Type of Home: Apartment Home Access: Stairs to enter     Home Layout: One level Home Equipment: Environmental consultant - 4 wheels;Bedside commode      Prior Function Level of Independence: Independent with assistive device(s)         Comments: amb with RW d/t hip pain; dtr assists with groceries; pt vacuums from seated position     Hand Dominance        Extremity/Trunk Assessment   Upper Extremity Assessment Upper Extremity Assessment: RUE deficits/detail;LUE deficits/detail RUE Deficits / Details: limited AROM due to arthritis LUE Deficits / Details: limited AROM due to arthritis    Lower Extremity Assessment Lower Extremity Assessment: Generalized weakness RLE Deficits / Details: AAROm grossly WFL; post op pain adn weakness RLE: Unable to fully assess due to pain       Communication   Communication: No difficulties  Cognition Arousal/Alertness: Awake/alert Behavior During Therapy: WFL for tasks assessed/performed Overall Cognitive Status: Within Functional Limits for tasks assessed                                        General Comments      Exercises     Assessment/Plan    PT Assessment  Patient needs continued PT services  PT Problem List Decreased strength;Decreased activity tolerance;Decreased balance;Decreased mobility;Decreased knowledge of use of DME       PT Treatment Interventions DME instruction;Functional mobility training;Stair training;Therapeutic activities;Therapeutic exercise;Gait training    PT Goals (Current goals can be found in the Care Plan section)  Acute Rehab PT Goals Patient Stated Goal: none stated PT Goal Formulation: With patient Time For Goal Achievement: 08/30/17 Potential to Achieve Goals: Good    Frequency 7X/week   Barriers to discharge Decreased  caregiver support      Co-evaluation               AM-PAC PT "6 Clicks" Daily Activity  Outcome Measure Difficulty turning over in bed (including adjusting bedclothes, sheets and blankets)?: Unable Difficulty moving from lying on back to sitting on the side of the bed? : Unable Difficulty sitting down on and standing up from a chair with arms (e.g., wheelchair, bedside commode, etc,.)?: Unable Help needed moving to and from a bed to chair (including a wheelchair)?: A Lot Help needed walking in hospital room?: A Lot Help needed climbing 3-5 steps with a railing? : A Lot 6 Click Score: 9    End of Session Equipment Utilized During Treatment: Gait belt Activity Tolerance: Patient limited by pain;Treatment limited secondary to medical complications (Comment) Patient left: with call bell/phone within reach;with chair alarm set;in chair Nurse Communication: Mobility status      Time: 1231-1255 PT Time Calculation (min) (ACUTE ONLY): 24 min   Charges:   PT Evaluation $PT Eval Low Complexity: 1 Low     PT G Codes:          Latonya Knight 08/27/2017, 1:58 PM

## 2017-08-23 NOTE — Progress Notes (Signed)
   08/23/17 1600  PT Visit Information  Last PT Received On 08/23/17  History of Present Illness s/p R THA  Subjective Data  Subjective pt more awake  Patient Stated Goal none stated  Precautions  Precautions Fall  Restrictions  Other Position/Activity Restrictions WBAT  Pain Assessment  Pain Assessment 0-10  Pain Score 6  Pain Location right hip  Pain Descriptors / Indicators Sore;Grimacing  Pain Intervention(s) Monitored during session;Premedicated before session;Repositioned  Cognition  Arousal/Alertness Awake/alert  Behavior During Therapy WFL for tasks assessed/performed  Overall Cognitive Status Within Functional Limits for tasks assessed  Bed Mobility  Overal bed mobility Needs Assistance  Bed Mobility Sit to Supine  Sit to supine Min assist  General bed mobility comments cues for hand placement and overall safety  Transfers  Overall transfer level Needs assistance  Equipment used Rolling walker (2 wheeled)  Sit to Stand Min assist  General transfer comment cues for safety and technique  Ambulation/Gait  Ambulation/Gait assistance Min assist  Ambulation Distance (Feet) 40 Feet  Assistive device Rolling walker (2 wheeled)  Gait Pattern/deviations Step-to pattern;Narrow base of support;Drifts right/left  General Gait Details pt with unsteady gait requiring min assist for balance and safety  Balance  Standing balance-Leahy Scale Poor  Standing balance comment reliant on UEs and therapist support  PT - End of Session  Activity Tolerance Patient tolerated treatment well  Patient left in bed;with call bell/phone within reach;with bed alarm set;with family/visitor present  Nurse Communication Mobility status  PT - Assessment/Plan  PT Plan Current plan remains appropriate  PT Visit Diagnosis Difficulty in walking, not elsewhere classified (R26.2)  PT Frequency (ACUTE ONLY) 7X/week  Follow Up Recommendations SNF  PT equipment Rolling walker with 5" wheels  AM-PAC PT "6  Clicks" Daily Activity Outcome Measure  Difficulty turning over in bed (including adjusting bedclothes, sheets and blankets)? 3  Difficulty moving from lying on back to sitting on the side of the bed?  1  Difficulty sitting down on and standing up from a chair with arms (e.g., wheelchair, bedside commode, etc,.)? 3  Help needed moving to and from a bed to chair (including a wheelchair)? 2  Help needed walking in hospital room? 2  Help needed climbing 3-5 steps with a railing?  2  6 Click Score 13  Mobility G Code  CK  Acute Rehab PT Goals  PT Goal Formulation With patient  Time For Goal Achievement 08/30/17  Potential to Achieve Goals Good  PT Time Calculation  PT Start Time (ACUTE ONLY) 1542  PT Stop Time (ACUTE ONLY) 1603  PT Time Calculation (min) (ACUTE ONLY) 21 min  PT General Charges  $$ ACUTE PT VISIT 1 Visit  PT Treatments  $Gait Training 8-22 mins  $Therapeutic Exercise 8-22 mins

## 2017-08-23 NOTE — Progress Notes (Signed)
Occupational Therapy Evaluation Patient Details Name: [name] MRN: [id number] DOB: 1941/08/05 Today's Date: 08/23/2017    History of Present Illness s/p R THA   Clinical Impression   Patient presents to OT with decreased ADL independence and safety due to the deficits listed below. She will benefit from skilled OT to maximize function and to facilitate Adrienne safe discharge. OT will follow.    Follow Up Recommendations  SNF    Equipment Recommendations  Other (comment)(tbd next venue of care)    Recommendations for Other Services       Precautions / Restrictions Precautions Precautions: Fall Restrictions Weight Bearing Restrictions: No Other Position/Activity Restrictions: WBAT      Mobility Bed Mobility               General bed mobility comments: NT -- sitting EOB with PT upon arrival  Transfers Overall transfer level: Needs assistance Equipment used: Rolling walker (2 wheeled) Transfers: Sit to/from [facility] Sit to Stand: Min assist;+2 physical assistance;+2 safety/equipment Stand pivot transfers: Min assist;+2 physical assistance;+2 safety/equipment       General transfer comment: cues for safety and technique    Balance                                           ADL either performed or assessed with clinical judgement   ADL Overall ADL's : Needs assistance/impaired     Grooming: Wash/dry face;Wash/dry hands;Set up(with washcloth)           Upper Body Dressing : Moderate assistance;Sitting(don gown as robe)   Lower Body Dressing: Maximal assistance;Sit to/from stand   Toilet Transfer: Minimal assistance;+2 for physical assistance;+2 for safety/equipment;Cueing for safety;Cueing for sequencing;Stand-pivot;BSC Toilet Transfer Details (indicate cue type and reason): simulated to recliner         Functional mobility during ADLs: Minimal assistance;+2 for physical assistance;+2 for  safety/equipment;Cueing for safety;Cueing for sequencing;Rolling walker General ADL Comments: limited evaluation today due to patient with pain and dizziness     Vision Baseline Vision/History: Wears glasses Patient Visual Report: No change from baseline       Perception     Praxis      Pertinent Vitals/Pain Pain Assessment: 0-10 Pain Score: 8  Pain Location: right hip Pain Descriptors / Indicators: Aching;Sore;Grimacing;Guarding Pain Intervention(s): Limited activity within patient's tolerance;Monitored during session;Premedicated before session;Repositioned     Hand Dominance     Extremity/Trunk Assessment Upper Extremity Assessment Upper Extremity Assessment: RUE deficits/detail;LUE deficits/detail RUE Deficits / Details: limited AROM due to arthritis LUE Deficits / Details: limited AROM due to arthritis   Lower Extremity Assessment Lower Extremity Assessment: Defer to PT evaluation        Communication Communication Communication: No difficulties   Cognition Arousal/Alertness: Awake/alert Behavior During Therapy: WFL for tasks assessed/performed Overall Cognitive Status: Within Functional Limits for tasks assessed                                     General Comments       Exercises     Shoulder Instructions      Home Living Family/patient expects to be discharged to:: Private residence Living Arrangements: Alone   Type of Home: Apartment Home Access: Stairs to enter     Home Layout: One level     Bathroom  Shower/Tub: Teacher, [name] years/pre: Standard     Home Equipment: Environmental consultant - 4 wheels;Bedside commode          Prior Functioning/Environment Level of Independence: Independent with assistive device(s)        Comments: amb with RW d/t hip pain; dtr assists with groceries; pt vacuums from seated position        OT Problem List: Decreased strength;Decreased activity tolerance;Impaired balance (sitting and/or  standing);Decreased knowledge of use of DME or AE;Decreased safety awareness;Pain;Impaired UE functional use      OT Treatment/Interventions: Self-care/ADL training;DME and/or AE instruction;Therapeutic activities;Patient/family education    OT Goals(Current goals can be found in the care plan section) Acute Rehab OT Goals Patient Stated Goal: none stated OT Goal Formulation: With patient Time For Goal Achievement: 09/06/17 Potential to Achieve Goals: Good ADL Goals Pt Will Perform Lower Body Bathing: with min assist;with adaptive equipment;sit to/from stand Pt Will Perform Lower Body Dressing: with min assist;with adaptive equipment;sit to/from stand Pt Will Transfer to Toilet: with supervision;ambulating;bedside commode Pt Will Perform Toileting - Clothing Manipulation and hygiene: with supervision;sit to/from stand Pt Will Perform Tub/Shower Transfer: with min assist;ambulating;rolling walker  OT Frequency: Min 2X/week   Barriers to D/C: Decreased caregiver support  lives alone; reports she has no one who can assist her       Co-evaluation              AM-PAC PT "6 Clicks" Daily Activity     Outcome Measure Help from another person eating meals?: None Help from another person taking care of personal grooming?: Adrienne Little Help from another person toileting, which includes using toliet, bedpan, or urinal?: Adrienne Lot Help from another person bathing (including washing, rinsing, drying)?: Adrienne Lot Help from another person to put on and taking off regular upper body clothing?: Adrienne Little Help from another person to put on and taking off regular lower body clothing?: Adrienne Lot 6 Click Score: 16   End of Session Equipment Utilized During Treatment: Gait belt;Rolling walker Nurse Communication: Mobility status  Activity Tolerance: Patient limited by pain Patient left: in chair;with call bell/phone within reach;with chair alarm set  OT Visit Diagnosis: Unsteadiness on feet (R26.81);Muscle  weakness (generalized) (M62.81)                Time: 2440-1027 OT Time Calculation (min): 13 min Charges:  OT General Charges $OT Visit: 1 Visit OT Evaluation $OT Eval Moderate Complexity: 1 Mod G-Codes:       [name] [name] 2017/08/26, 1:48 PM

## 2017-08-23 NOTE — Progress Notes (Signed)
Subjective: 1 Day Post-Op Procedure(s) (LRB): RIGHT TOTAL HIP ARTHROPLASTY ANTERIOR APPROACH (Right) Patient reports pain as moderate.  Very slow mobility.  Acute blood loss anemia from her surgery.  Objective: Vital signs in last 24 hours: Temp:  [97.9 F (36.6 C)-98.7 F (37.1 C)] 98.6 F (37 C) (12/01 1010) Pulse Rate:  [59-83] 83 (12/01 1010) Resp:  [13-28] 18 (12/01 1010) BP: (89-130)/(40-65) 103/48 (12/01 1010) SpO2:  [92 %-100 %] 95 % (12/01 1026) Weight:  [150 lb (68 kg)] 150 lb (68 kg) (11/30 1745)  Intake/Output from previous day: 11/30 0701 - 12/01 0700 In: 3374 [P.O.:259; I.V.:2800; IV Piggyback:315] Out: 1900 [Urine:1550; Blood:350] Intake/Output this shift: Total I/O In: -  Out: 500 [Urine:500]  Recent Labs    08/23/17 0549  HGB 8.6*   Recent Labs    08/23/17 0549  WBC 11.6*  RBC 2.93*  HCT 26.9*  PLT 202   Recent Labs    08/23/17 0549  NA 137  K 3.1*  CL 104  CO2 26  BUN 9  CREATININE 0.57  GLUCOSE 140*  CALCIUM 8.7*   No results for input(s): LABPT, INR in the last 72 hours.  Sensation intact distally Intact pulses distally Dorsiflexion/Plantar flexion intact Incision: dressing C/D/I  Assessment/Plan: 1 Day Post-Op Procedure(s) (LRB): RIGHT TOTAL HIP ARTHROPLASTY ANTERIOR APPROACH (Right) Up with therapy  Will likely need skilled nursing placement. Will follow H/H - may need transfusion if gets low and patient symptomatic.  Mcarthur Rossetti 08/23/2017, 1:37 PM

## 2017-08-23 NOTE — Discharge Instructions (Signed)

## 2017-08-24 LAB — CBC
HEMATOCRIT: 26.3 % — AB (ref 36.0–46.0)
Hemoglobin: 8.5 g/dL — ABNORMAL LOW (ref 12.0–15.0)
MCH: 29.4 pg (ref 26.0–34.0)
MCHC: 32.3 g/dL (ref 30.0–36.0)
MCV: 91 fL (ref 78.0–100.0)
PLATELETS: 192 10*3/uL (ref 150–400)
RBC: 2.89 MIL/uL — ABNORMAL LOW (ref 3.87–5.11)
RDW: 14.7 % (ref 11.5–15.5)
WBC: 12.1 10*3/uL — ABNORMAL HIGH (ref 4.0–10.5)

## 2017-08-24 NOTE — Progress Notes (Signed)
Physical Therapy Treatment Patient Details Name: [name] MRN: [id number] DOB: 1941/05/27 Today's Date: 08/24/2017    History of Present Illness s/p R THA    PT Comments    Pt is progressing slowly and continue to recommend SNF post acute to maximize indpependence;  pt reports not being very active prior to surgery;   She c/o of dizziness with standing (sitting BP 100/57, map 64, HR 82;  standing BP 107/54,  map 66, HR 84); c/o of generalized fatigue ( Hgb 8.5); pt also reports being tired "a lot"  Prior to surgery; will continue to follow  Follow Up Recommendations  SNF     Equipment Recommendations  None recommended by PT    Recommendations for Other Services       Precautions / Restrictions Precautions Precautions: Fall Restrictions Weight Bearing Restrictions: No Other Position/Activity Restrictions: WBAT    Mobility  Bed Mobility               General bed mobility comments: pt received in chair  Transfers Overall transfer level: Needs assistance Equipment used: Rolling walker (2 wheeled) Transfers: Sit to/from Stand Sit to Stand: Min guard         General transfer comment: cues for safety and technique  Ambulation/Gait Ambulation/Gait assistance: Min assist Ambulation Distance (Feet): 30 Feet Assistive device: Rolling walker (2 wheeled) Gait Pattern/deviations: Step-to pattern;Narrow base of support;Antalgic     General Gait Details: pt with unsteady gait requiring min assist for balance and safety, cues for sequence   Stairs            Wheelchair Mobility    Modified Rankin (Stroke Patients Only)       Balance             Standing balance-Leahy Scale: Poor Standing balance comment: reliant on UEs and therapist support                            Cognition Arousal/Alertness: Awake/alert Behavior During Therapy: WFL for tasks assessed/performed Overall Cognitive Status: Within Functional Limits for tasks  assessed                                        Exercises Total Joint Exercises Ankle Circles/Pumps: AROM;Both;10 reps Quad Sets: AROM;Strengthening;Both;10 reps Heel Slides: AAROM;Right;5 reps;Limitations Heel Slides Limitations: pain Hip ABduction/ADduction: AAROM;Right;10 reps    General Comments        Pertinent Vitals/Pain Pain Assessment: 0-10 Pain Score: 7  Pain Location: right hip Pain Descriptors / Indicators: Sore;Grimacing;Guarding Pain Intervention(s): Limited activity within patient's tolerance;Monitored during session;Ice applied;Premedicated before session    Home Living                      Prior Function            PT Goals (current goals can now be found in the care plan section) Acute Rehab PT Goals Patient Stated Goal: none stated PT Goal Formulation: With patient Time For Goal Achievement: 08/30/17 Potential to Achieve Goals: Good Progress towards PT goals: Progressing toward goals    Frequency    7X/week      PT Plan Current plan remains appropriate    Co-evaluation              AM-PAC PT "6 Clicks" Daily Activity  Outcome Measure  Difficulty turning  over in bed (including adjusting bedclothes, sheets and blankets)?: Unable Difficulty moving from lying on back to sitting on the side of the bed? : Unable Difficulty sitting down on and standing up from a chair with arms (e.g., wheelchair, bedside commode, etc,.)?: A Little Help needed moving to and from a bed to chair (including a wheelchair)?: A Little Help needed walking in hospital room?: A Little Help needed climbing 3-5 steps with a railing? : A Lot 6 Click Score: 13    End of Session Equipment Utilized During Treatment: Gait belt Activity Tolerance: Patient limited by fatigue;Patient tolerated treatment well Patient left: in chair;with call bell/phone within reach;with chair alarm set   PT Visit Diagnosis: Difficulty in walking, not elsewhere  classified (R26.2)     Time: 3664-4034 PT Time Calculation (min) (ACUTE ONLY): 24 min  Charges:  $Gait Training: 8-22 mins $Therapeutic Exercise: 8-22 mins                    G Codes:          Taeshawn Helfman 30-Aug-2017, 11:37 AM

## 2017-08-24 NOTE — NC FL2 (Signed)
[place] LEVEL OF CARE SCREENING TOOL     IDENTIFICATION  Patient Name: [name] Birthdate: [birth year] Sex: female Admission Date (Current Location): 08/22/2017  [facility] and [place] Number:  Herbalist and Address:  [facility],  [place], [place]      Provider Number: [id number]  Attending Physician Name and Address:  Mcarthur Rossetti  Relative Name and Phone Number:  [name] daughter, [phone]    Current Level of Care: Hospital Recommended Level of Care: [place] Prior Approval Number:    Date Approved/Denied:   PASRR Number: pending  Discharge Plan: SNF    Current Diagnoses: Patient Active Problem List   Diagnosis Date Noted  . 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 Symptomatic anemia 04/10/2017  . Generalized weakness   . Heme positive stool   . PAH (pulmonary artery hypertension) ([place])   . Epigastric burning sensation 04/19/2015  . Chronic diarrhea 04/19/2015  . History of GI bleed 03/14/2015  . Long-term use of high-risk medication 01/11/2015  . Dysphagia 01/11/2015  . S/P AVR 01/11/2015  . Dyspnea 01/11/2015  . Tobacco abuse 01/11/2015  . Numbness of anterior thigh-Left 12/09/2014  . Pain in joint, lower leg 12/09/2014  . Essential hypertension 07/14/2014  . Coronary artery disease involving coronary bypass graft of native heart without angina pectoris 07/14/2014  . Dilated aortic root ([place]) 07/14/2014  . H/O aortic valve replacement 07/14/2014  . Tobacco use 07/14/2014  . Obesity 07/14/2014  . Palpitations 01/03/2014  . Aortic valve disorder 06/19/2013  . Heart valve replaced by other means 06/19/2013  . Flatulence, eructation, and gas pain 06/19/2013  . Pure hypercholesterolemia 06/19/2013  . 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 Hypopotassemia 06/19/2013  . Anemia, unspecified 06/19/2013  . Anxiety state,  unspecified 06/19/2013  . Esophageal reflux 06/19/2013  . Personal history of other diseases of digestive system 06/19/2013  . Iron deficiency anemia, unspecified 06/19/2013  . Proteinuria 06/19/2013  . Arthropathy, unspecified, site unspecified 06/19/2013  . Major depressive disorder, single episode, unspecified 06/19/2013  . 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 Tobacco use disorder 06/19/2013  . Peripheral vascular disease ([place]) 10/14/2012  . Atherosclerosis of aorta ([place]) 10/14/2012  . Pain in limb 10/14/2012  . Atherosclerosis of abdominal aorta ([place]) 10/16/2011  . PVD (peripheral vascular disease) ([place]) 10/16/2011    Orientation RESPIRATION BLADDER Height & Weight     Self  O2(Nasal Cannula 2L) Continent, Indwelling catheter Weight: 150 lb (68 kg) Height:  4\' 9"  (144.8 cm)  BEHAVIORAL SYMPTOMS/MOOD NEUROLOGICAL BOWEL NUTRITION STATUS      Continent Diet(See DC Summary)  AMBULATORY STATUS COMMUNICATION OF NEEDS Skin   Limited Assist Verbally Surgical wounds                       Personal Care Assistance Level of Assistance  Dressing, Bathing, Feeding Bathing Assistance: Limited assistance Feeding assistance: Limited assistance Dressing Assistance: Maximum assistance     Functional Limitations Info  Sight, Hearing, Speech Sight Info: Adequate Hearing Info: Adequate Speech Info: Adequate    SPECIAL CARE FACTORS FREQUENCY  PT (By licensed PT), OT (By licensed OT)     PT Frequency: 5x week OT Frequency: 5x week            Contractures Contractures Info: Not present    Additional Factors Info  Code Status, Allergies, Psychotropic Code Status Info: Full Allergies Info: Codine Psychotropic Info:  Zoloft         Current Medications (08/24/2017):  This is the current hospital active medication list Current Facility-Administered Medications  Medication Dose Route Frequency Provider Last Rate Last Dose  . acetaminophen (TYLENOL) tablet 650 mg  650 mg Oral Q4H  PRN Mcarthur Rossetti, MD       Or  . acetaminophen (TYLENOL) suppository 650 mg  650 mg Rectal Q4H PRN Mcarthur Rossetti, MD      . alum & mag hydroxide-simeth (MAALOX/MYLANTA) 200-200-20 MG/5ML suspension 30 mL  30 mL Oral Q4H PRN Mcarthur Rossetti, MD   30 mL at 08/24/17 0400  . amLODipine (NORVASC) tablet 10 mg  10 mg Oral Daily Mcarthur Rossetti, MD   10 mg at 08/23/17 0936  . aspirin EC tablet 325 mg  325 mg Oral Q breakfast Mcarthur Rossetti, MD   325 mg at 08/24/17 0827  . atorvastatin (LIPITOR) tablet 80 mg  80 mg Oral Daily Mcarthur Rossetti, MD   80 mg at 08/24/17 8242  . bisacodyl (DULCOLAX) suppository 10 mg  10 mg Rectal Daily PRN Mcarthur Rossetti, MD      . calcium-vitamin D (OSCAL WITH D) 500-200 MG-UNIT per tablet 1 tablet  1 tablet Oral Daily Mcarthur Rossetti, MD   1 tablet at 08/24/17 [phone]  . diphenhydrAMINE (BENADRYL) 12.5 MG/5ML elixir 12.5-25 mg  12.5-25 mg Oral Q4H PRN Mcarthur Rossetti, MD      . docusate sodium (COLACE) capsule 100 mg  100 mg Oral BID Mcarthur Rossetti, MD   100 mg at 08/24/17 1443  . ferrous sulfate tablet 325 mg  325 mg Oral Q breakfast Mcarthur Rossetti, MD   325 mg at 08/24/17 0827  . furosemide (LASIX) tablet 20 mg  20 mg Oral Daily Mcarthur Rossetti, MD   20 mg at 08/24/17 0931  . glycopyrrolate (ROBINUL) tablet 2 mg  2 mg Oral BID Mcarthur Rossetti, MD   2 mg at 08/24/17 1540  . HYDROcodone-acetaminophen (NORCO/VICODIN) 5-325 MG per tablet 1-2 tablet  1-2 tablet Oral Q4H PRN Mcarthur Rossetti, MD   2 tablet at 08/24/17 0525  . HYDROmorphone (DILAUDID) injection 0.5 mg  0.5 mg Intravenous Q2H PRN Mcarthur Rossetti, MD   0.5 mg at 08/23/17 0357  . LORazepam (ATIVAN) tablet 0.5 mg  0.5 mg Oral QHS PRN Mcarthur Rossetti, MD      . menthol-cetylpyridinium (CEPACOL) lozenge 3 mg  1 lozenge Oral PRN Mcarthur Rossetti, MD       Or  . phenol (CHLORASEPTIC)  mouth spray 1 spray  1 spray Mouth/Throat PRN Mcarthur Rossetti, MD      . methocarbamol (ROBAXIN) tablet 500 mg  500 mg Oral Q6H PRN Mcarthur Rossetti, MD   500 mg at 08/24/17 0827   Or  . methocarbamol (ROBAXIN) 500 mg in dextrose 5 % 50 mL IVPB  500 mg Intravenous Q6H PRN Mcarthur Rossetti, MD 110 mL/hr at 08/22/17 1659 500 mg at 08/22/17 1659  . metoCLOPramide (REGLAN) tablet 5-10 mg  5-10 mg Oral Q8H PRN Mcarthur Rossetti, MD       Or  . metoCLOPramide (REGLAN) injection 5-10 mg  5-10 mg Intravenous Q8H PRN Mcarthur Rossetti, MD      . multivitamin with minerals tablet 1 tablet  1 tablet Oral Daily Mcarthur Rossetti, MD   1 tablet at 08/24/17 0928  . nebivolol (BYSTOLIC) tablet 5 mg  5 mg Oral Daily Mcarthur Rossetti, MD   5 mg at 08/23/17 0935  . ondansetron (ZOFRAN) tablet 4 mg  4 mg Oral Q6H PRN Mcarthur Rossetti, MD       Or  . ondansetron [facility]) injection 4 mg  4 mg Intravenous Q6H PRN Mcarthur Rossetti, MD      . oxyCODONE (Oxy IR/ROXICODONE) immediate release tablet 5 mg  5 mg Oral Q3H PRN Mcarthur Rossetti, MD   5 mg at 08/24/17 0827  . pantoprazole (PROTONIX) EC tablet 40 mg  40 mg Oral Daily Mcarthur Rossetti, MD   40 mg at 08/24/17 6553  . potassium chloride (K-DUR,KLOR-CON) CR tablet 10 mEq  10 mEq Oral Daily Mcarthur Rossetti, MD   10 mEq at 08/24/17 7482  . sertraline (ZOLOFT) tablet 200 mg  200 mg Oral Daily Mcarthur Rossetti, MD   200 mg at 08/24/17 7078  . tiotropium (SPIRIVA) inhalation capsule 18 mcg  18 mcg Inhalation q morning - 10a Mcarthur Rossetti, MD   18 mcg at 08/24/17 6754     Discharge Medications: Please see discharge summary for a list of discharge medications.  Relevant Imaging Results:  Relevant Lab Results:   Additional Information SS: [id number]   Normajean Baxter, LCSW

## 2017-08-24 NOTE — Progress Notes (Signed)
Subjective: 2 Days Post-Op Procedure(s) (LRB): RIGHT TOTAL HIP ARTHROPLASTY ANTERIOR APPROACH (Right) Patient reports pain as mild. No complaints of SOB or chest pain, no calf pain, voiding without difficulty  Objective: Vital signs in last 24 hours: Temp:  [98.4 F (36.9 C)-98.9 F (37.2 C)] 98.4 F (36.9 C) (12/02 0436) Pulse Rate:  [73-83] 73 (12/02 0436) Resp:  [16-18] 18 (12/02 0436) BP: (103-118)/(48-59) 112/55 (12/02 0436) SpO2:  [92 %-98 %] 94 % (12/02 0649)  Intake/Output from previous day: 12/01 0701 - 12/02 0700 In: 1773.8 [P.O.:1080; I.V.:693.8] Out: 900 [Urine:900] Intake/Output this shift: No intake/output data recorded.  Recent Labs    08/23/17 0549 08/24/17 0542  HGB 8.6* 8.5*   Recent Labs    08/23/17 0549 08/24/17 0542  WBC 11.6* 12.1*  RBC 2.93* 2.89*  HCT 26.9* 26.3*  PLT 202 192   Recent Labs    08/23/17 0549  NA 137  K 3.1*  CL 104  CO2 26  BUN 9  CREATININE 0.57  GLUCOSE 140*  CALCIUM 8.7*   No results for input(s): LABPT, INR in the last 72 hours.  Neurologically intact ABD soft Compartment soft  Assessment/Plan: 2 Days Post-Op Procedure(s) (LRB): RIGHT TOTAL HIP ARTHROPLASTY ANTERIOR APPROACH (Right) Up with therapy, dressing right hip clean and dry. Awaiting decision on rehab  [name] 08/24/2017, 9:30 AM

## 2017-08-24 NOTE — Clinical Social Work Note (Addendum)
Clinical Social Work Assessment  Patient Details  Name: [name] MRN: [id number] Date of Birth: August 13, 1941  Date of referral:  08/24/17               Reason for consult:  Facility Placement                Permission sought to share information with:  Chartered certified accountant granted to share information::  Yes, Verbal Permission Granted  Name::     Engineer, civil (consulting)::  SNF  Relationship::  daughter  Contact Information:     Housing/Transportation Living arrangements for the past 2 months:  Single Family Home Source of Information:  Patient Patient Interpreter Needed:  None Criminal Activity/Legal Involvement Pertinent to Current Situation/Hospitalization:  No - Comment as needed Significant Relationships:  Adult Children Lives with:  Self Do you feel safe going back to the place where you live?  No Need for family participation in patient care:  Yes (Comment)  Care giving concerns:  CSW met with patient at bedside to discuss SNF placement. Pt indicated as oriented to person only however was able to confirm SNf placement and recommended that CSW f/u with daughter [name]. Pt indicated that she reside at home alone and unable to return home with new impairment.   Social Worker assessment / plan:  CSW met with patient at bedside and discussed SNF options and placement. Pt amenable to placement and gave permission to send to local SNf's and contact daughter to f/u. FL2 completed and NCMUST down, so passr pending.  1:15pm: CSW called daughter [name] and left message to call back to discuss disposition. CSW will continue to follow up.  Employment status:  Retired Nurse, adult PT Recommendations:  [place] / Referral to community resources:  [place]  Patient/Family's Response to care:  Patient appreciative of CSW assistance with SNf placement and following up on disposition. No issues or  concerns identified.  Patient/Family's Understanding of and Emotional Response to Diagnosis, Current Treatment, and Prognosis:  Patient has good understanding of diagnosis, current treatment and prognosis as evidenced by her accepting that she will need short term rehab and is not safe to return home with new impairment. No issues or concerns identified at this time.  Emotional Assessment Appearance:  Appears stated age Attitude/Demeanor/Rapport:  (Cooperative) Affect (typically observed):  Accepting, Appropriate Orientation:  Oriented to Self Alcohol / Substance use:  Not Applicable Psych involvement (Current and /or in the community):  No (Comment)  Discharge Needs  Concerns to be addressed:  Discharge Planning Concerns Readmission within the last 30 days:  Yes Current discharge risk:  Dependent with Mobility, Physical Impairment, Lives alone Barriers to Discharge:  No Barriers Identified   Normajean Baxter, LCSW 08/24/2017, 12:08 PM

## 2017-08-25 ENCOUNTER — Encounter (HOSPITAL_COMMUNITY): Payer: Self-pay | Admitting: Orthopaedic Surgery

## 2017-08-25 LAB — CBC
HCT: 25.1 % — ABNORMAL LOW (ref 36.0–46.0)
Hemoglobin: 8.1 g/dL — ABNORMAL LOW (ref 12.0–15.0)
MCH: 29.7 pg (ref 26.0–34.0)
MCHC: 32.3 g/dL (ref 30.0–36.0)
MCV: 91.9 fL (ref 78.0–100.0)
PLATELETS: 213 10*3/uL (ref 150–400)
RBC: 2.73 MIL/uL — ABNORMAL LOW (ref 3.87–5.11)
RDW: 14.5 % (ref 11.5–15.5)
WBC: 12 10*3/uL — ABNORMAL HIGH (ref 4.0–10.5)

## 2017-08-25 MED ORDER — ASPIRIN 325 MG PO TBEC: 325.0000 mg | DELAYED_RELEASE_TABLET | Freq: Every day | ORAL | 0 refills | Status: DC

## 2017-08-25 MED ORDER — OXYCODONE-ACETAMINOPHEN 5-325 MG PO TABS: 1.0000 | ORAL_TABLET | ORAL | 0 refills | Status: DC | PRN

## 2017-08-25 NOTE — Progress Notes (Signed)
Patient ID: [name], female   DOB: 19-Nov-1940, 76 y.o.   MRN: [id number] Stable from a post-surgery standpoint.  Vitals stable.  H&H low from acute blood loss anemia, but tolerating well.  Right hip stable.  Lives at home alone and not cleared by therapy for discharge to home, but skilled nursing instead.  I spoke to her in length about this.  Can be discharge to skilled nursing from my standpoint.

## 2017-08-25 NOTE — Progress Notes (Signed)
Physical Therapy Treatment Patient Details Name: MCCARTNEY BRUCKS MRN: [id number] DOB: 04/23/1941 Today's Date: 08/25/2017    History of Present Illness s/p R THA    PT Comments    POD # 3  Pt OOB in recliner c/o low back pain.  Assisted with amb a greater distance but required one sitting rest break due to fatigue.  Noted HgB 8.1    Only slight dizziness.  Performed some THR TE's followed by ICE.  Pt plans to D/C to   Follow Up Recommendations  SNF     Equipment Recommendations  None recommended by PT    Recommendations for Other Services       Precautions / Restrictions Precautions Precautions: Fall Restrictions Weight Bearing Restrictions: No Other Position/Activity Restrictions: WBAT    Mobility  Bed Mobility               General bed mobility comments: OOB in recliner  Transfers Overall transfer level: Needs assistance Equipment used: Rolling walker (2 wheeled) Transfers: Sit to/from Stand Sit to Stand: Min guard         General transfer comment: 25% cues for safety and technique  Ambulation/Gait Ambulation/Gait assistance: Min guard;Min assist Ambulation Distance (Feet): 85 Feet Assistive device: Rolling walker (2 wheeled) Gait Pattern/deviations: Step-to pattern;Narrow base of support;Antalgic Gait velocity: decreased   General Gait Details: pt with unsteady gait requiring min assist for balance and safety, cues for sequence  required one sitting rest break due to fatigue   Stairs            Wheelchair Mobility    Modified Rankin (Stroke Patients Only)       Balance                                            Cognition Arousal/Alertness: Awake/alert Behavior During Therapy: WFL for tasks assessed/performed Overall Cognitive Status: Within Functional Limits for tasks assessed                                        Exercises   Total Hip Replacement TE's 10 reps ankle pumps 10 reps knee  presses 10 reps heel slides 10 reps SAQ's 10 reps ABD Followed by ICE    General Comments        Pertinent Vitals/Pain Pain Assessment: 0-10 Pain Score: 8  Pain Location: right hip and low back Pain Descriptors / Indicators: Sore;Grimacing;Guarding;Operative site guarding Pain Intervention(s): Monitored during session;Repositioned;Ice applied    Home Living                      Prior Function            PT Goals (current goals can now be found in the care plan section) Progress towards PT goals: Progressing toward goals    Frequency    7X/week      PT Plan Current plan remains appropriate    Co-evaluation              AM-PAC PT "6 Clicks" Daily Activity  Outcome Measure  Difficulty turning over in bed (including adjusting bedclothes, sheets and blankets)?: Unable Difficulty moving from lying on back to sitting on the side of the bed? : Unable Difficulty sitting down on and standing up from a chair  with arms (e.g., wheelchair, bedside commode, etc,.)?: A Little Help needed moving to and from a bed to chair (including a wheelchair)?: A Little Help needed walking in hospital room?: A Little Help needed climbing 3-5 steps with a railing? : A Lot 6 Click Score: 13    End of Session Equipment Utilized During Treatment: Gait belt Activity Tolerance: Patient tolerated treatment well Patient left: in chair;with call bell/phone within reach;with chair alarm set Nurse Communication: Mobility status PT Visit Diagnosis: Difficulty in walking, not elsewhere classified (R26.2)     Time: 1035-1050 PT Time Calculation (min) (ACUTE ONLY): 15 min  Charges:  $Gait Training: 8-22 mins                    G Codes:       Rica Koyanagi  PTA [facility]  Acute  Rehab Pager      [phone]

## 2017-08-25 NOTE — Clinical Social Work Placement (Addendum)
D/C Summary faxed.  [facility] called for transport.   CLINICAL SOCIAL WORK PLACEMENT  NOTE  Date:  08/25/2017  Patient Details  Name: [name] MRN: [id number] Date of Birth: 1940/12/31  Clinical Social Work is seeking post-discharge placement for this patient at the [place] level of care (*CSW will initial, date and re-position this form in  chart as items are completed):  Yes   Patient/family provided with [place] Work Department's list of facilities offering this level of care within the geographic area requested by the patient (or if unable, by the patient's family).  Yes   Patient/family informed of their freedom to choose among providers that offer the needed level of care, that participate in Medicare, Medicaid or managed care program needed by the patient, have an available bed and are willing to accept the patient.  Yes   Patient/family informed of [facility]'s ownership interest in [facility] and [facility], as well as of the fact that they are under no obligation to receive care at these facilities.  PASRR submitted to EDS on 08/25/17     PASRR number received on 08/25/17     Existing PASRR number confirmed on       FL2 transmitted to all facilities in geographic area requested by pt/family on       FL2 transmitted to all facilities within larger geographic area on       Patient informed that his/her managed care company has contracts with or will negotiate with certain facilities, including the following:  [place]     Yes   Patient/family informed of bed offers received.  Patient chooses bed at [facility]     Physician recommends and patient chooses bed at      Patient to be transferred to [facility] on 08/25/17.  Patient to be transferred to facility by PTAR     Patient family notified on 08/25/17 of transfer.  Name of family member notified:  Daughter      PHYSICIAN       Additional Comment:     _______________________________________________ Lia Hopping, LCSW 08/25/2017, 11:26 AM

## 2017-08-25 NOTE — Care Management Important Message (Signed)
Important Message  Patient Details  Name: [name] MRN: [id number] Date of Birth: 1941-02-24   Medicare Important Message Given:  Yes    Kerin Salen 08/25/2017, 10:24 AMImportant Message  Patient Details  Name: [name] MRN: [id number] Date of Birth: 24-Apr-1941   Medicare Important Message Given:  Yes    Kerin Salen 08/25/2017, 10:24 AM

## 2017-08-25 NOTE — Discharge Summary (Signed)
Patient ID: [name] MRN: [id number] DOB/AGE: [birth year] 76 y.o.  Admit date: 08/22/2017 Discharge date: 08/25/2017  Admission Diagnoses:  Principal Problem:   Status post total replacement of right hip   Discharge Diagnoses:  Same  Past Medical History:  Diagnosis Date  . Abdominal pain, unspecified site   . Anemia    Iron deficiency anemia  . Anxiety   . Aortic stenosis   . Arthritis   . CAD (coronary artery disease)    psot bypass x2 to diagonal and obtuse marginal. SVG graft-01/08/2009  . Depression   . Dyspnea   . GERD (gastroesophageal reflux disease)   . History of cardiovascular stress test    Myoview 7/16:  Low risk, no ischemia or scar, EF 72%  . History of echocardiogram    Echo 7/16:  EF 55-60%, no RWMA, Gr 2 DD, AVR ok (mean 20 mmHg), PASP 40 mmHg  . History of GI bleed    With AVM  . Hypercholesterolemia   . Hyperlipidemia   . Hypertension   . Iron deficiency anemia   . Myocardial infarction ([place]) 2010  . Neuromuscular disorder (HCC)    numbness in hands  . Peripheral vascular disease ([place])    righ tleg  . Shoulder impingement syndrome    Right shoulder    Surgeries: Procedure(s): RIGHT TOTAL HIP ARTHROPLASTY ANTERIOR APPROACH on 08/22/2017   Consultants:   Discharged Condition: Improved  Hospital Course: [name] is an 76 y.o. female who was admitted 08/22/2017 for operative treatment ofStatus post total replacement of right hip. Patient has severe unremitting pain that affects sleep, daily activities, and work/hobbies. After pre-op clearance the patient was taken to the operating room on 08/22/2017 and underwent  Procedure(s): RIGHT TOTAL HIP ARTHROPLASTY ANTERIOR APPROACH.    Patient was given perioperative antibiotics:  Anti-infectives (From admission, onward)   Start     Dose/Rate Route Frequency Ordered Stop   08/23/17 0600  ceFAZolin (ANCEF) IVPB 2g/100 mL premix     2 g 200 mL/hr over 30 Minutes Intravenous On call to  O.R. 08/22/17 1220 08/22/17 1412   08/22/17 2000  ceFAZolin (ANCEF) IVPB 1 g/50 mL premix     1 g 100 mL/hr over 30 Minutes Intravenous Every 6 hours 08/22/17 1751 08/23/17 0256   08/22/17 1232  ceFAZolin (ANCEF) 2-4 GM/100ML-% IVPB    Comments:  Mardelle Matte   : cabinet override      08/22/17 1232 08/22/17 1402       Patient was given sequential compression devices, early ambulation, and chemoprophylaxis to prevent DVT.  Patient benefited maximally from hospital stay and there were no complications.    Recent vital signs:  Patient Vitals for the past 24 hrs:  BP Temp Temp src Pulse Resp SpO2  08/25/17 0444 (!) 115/53 98.4 F (36.9 C) Oral 86 18 96 %  08/24/17 2047 (!) 113/58 98.6 F (37 C) Oral 74 16 -  08/24/17 1400 (!) 116/44 98.8 F (37.1 C) Oral 76 14 95 %  08/24/17 0928 (!) 97/48 - - 75 - -     Recent laboratory studies:  Recent Labs    08/23/17 0549 08/24/17 0542 08/25/17 0531  WBC 11.6* 12.1* 12.0*  HGB 8.6* 8.5* 8.1*  HCT 26.9* 26.3* 25.1*  PLT 202 192 213  NA 137  --   --   K 3.1*  --   --   CL 104  --   --   CO2 26  --   --  BUN 9  --   --   CREATININE 0.57  --   --   GLUCOSE 140*  --   --   CALCIUM 8.7*  --   --      Discharge Medications:   Allergies as of 08/25/2017      Reactions   Codeine Other (See Comments)   Feeling intoxicated      Medication List    STOP taking these medications   aspirin 81 MG tablet Replaced by:  aspirin 325 MG EC tablet   meloxicam 7.5 MG tablet Commonly known as:  MOBIC   traMADol 50 MG tablet Commonly known as:  ULTRAM     TAKE these medications   acetaminophen 325 MG tablet Commonly known as:  TYLENOL Take 2 tablets (650 mg total) by mouth every 6 (six) hours as needed for mild pain (or Fever >/= 101).   amLODipine 10 MG tablet Commonly known as:  NORVASC TAKE 1 TABLET BY MOUTH EVERY DAY   aspirin 325 MG EC tablet Take 1 tablet (325 mg total) by mouth daily with breakfast. Replaces:  aspirin  81 MG tablet   atorvastatin 80 MG tablet Commonly known as:  LIPITOR TAKE 1 TABLET BY MOUTH EVERY DAY   BYSTOLIC 5 MG tablet Generic drug:  nebivolol TAKE 1 TABLET BY MOUTH EVERY DAY   CALCIUM 600+D 600-400 MG-UNIT tablet Generic drug:  Calcium Carbonate-Vitamin D Take 1 tablet by mouth daily.   ferrous sulfate 325 (65 FE) MG tablet Take 1 tablet (325 mg total) by mouth 2 (two) times daily with a meal. What changed:  when to take this   furosemide 20 MG tablet Commonly known as:  LASIX Take 20 mg daily by mouth.   glycopyrrolate 2 MG tablet Commonly known as:  ROBINUL Take 2 mg by mouth 2 (two) times daily.   lisinopril-hydrochlorothiazide 20-25 MG tablet Commonly known as:  PRINZIDE,ZESTORETIC TAKE 1/2 TABLET BY MOUTH TWICE A DAY   LORazepam 0.5 MG tablet Commonly known as:  ATIVAN Take 0.5 mg by mouth at bedtime as needed for anxiety or sleep.   multivitamin tablet Take 1 tablet by mouth daily.   omeprazole 40 MG capsule Commonly known as:  PRILOSEC Take 1 capsule (40 mg total) by mouth 2 (two) times daily.   oxyCODONE-acetaminophen 5-325 MG tablet Commonly known as:  ROXICET Take 1-2 tablets by mouth every 4 (four) hours as needed.   potassium chloride 10 MEQ CR capsule Commonly known as:  MICRO-K TAKE 2 CAPSULES BY MOUTH TWICE A DAY What changed:    how much to take  how to take this  when to take this   sertraline 100 MG tablet Commonly known as:  ZOLOFT TAKE 2 TABLETS (200MG ) BY MOUTH ONCE A DAY   tiotropium 18 MCG inhalation capsule Commonly known as:  SPIRIVA HANDIHALER Place 1 capsule (18 mcg total) into inhaler and inhale every morning.            Durable Medical Equipment  (From admission, onward)        Start     Ordered   08/22/17 1752  DME 3 n 1  Once     08/22/17 1751   08/22/17 1752  DME Walker rolling  Once    Question:  Patient needs a walker to treat with the following condition  Answer:  Status post total replacement of  right hip   08/22/17 1751      Diagnostic Studies: Dg Pelvis Portable  Result  Date: 08/22/2017 CLINICAL DATA:  Patient status post right total hip replacement today. EXAM: PORTABLE PELVIS 1-2 VIEWS COMPARISON:  Intraoperative imaging this same day. FINDINGS: Right total hip arthroplasty is in place. The device is located. No fracture or hardware complication. Gas in the soft tissues and surgical staples noted. IMPRESSION: Right total hip replacement.  No acute finding. Electronically Signed   By: Inge Rise M.D.   On: 08/22/2017 16:24   Dg C-arm 61-120 Min-no Report  Result Date: 08/22/2017 Fluoroscopy was utilized by the requesting physician.  No radiographic interpretation.   Dg Hip Operative Unilat W Or W/o Pelvis Right  Result Date: 08/22/2017 CLINICAL DATA:  Right total hip replacement EXAM: DG C-ARM 61-120 MIN-NO REPORT; OPERATIVE RIGHT HIP WITH PELVIS COMPARISON:  None. FINDINGS: Changes of right hip replacement. Normal AP alignment. No hardware bony complicating feature. IMPRESSION: Changes of right hip replacement.  No complicating feature. Electronically Signed   By: Rolm Baptise M.D.   On: 08/22/2017 15:59    Disposition: to skilled nursing facility  Discharge Instructions    Discharge patient   Complete by:  As directed    Discharge disposition:  03-[facility]   Discharge patient date:  08/25/2017      Follow-up Information    Mcarthur Rossetti, MD Follow up in 2 week(s).   Specialty:  Orthopedic Surgery Contact information: [place] [address] [phone]            Signed: Mcarthur Rossetti 08/25/2017, 7:17 AM

## 2017-08-25 NOTE — Op Note (Signed)
NAME:  Weeks, Adrienne                   ACCOUNT NO.:  MEDICAL RECORD NO.:  [id number]  LOCATION:                                 FACILITY:  PHYSICIAN:  Lind Guest. Ninfa Linden, M.D.DATE OF BIRTH:  DATE OF PROCEDURE:  08/22/2017 DATE OF DISCHARGE:                              OPERATIVE REPORT   PREOPERATIVE DIAGNOSIS:  Primary osteoarthritis with degenerative joint disease, right hip and acetabular protrusio.  POSTOPERATIVE DIAGNOSIS:  Primary osteoarthritis with degenerative joint disease, right hip and acetabular protrusio.  PROCEDURE:  Right total hip arthroplasty through direct anterior approach.  IMPLANTS:  DePuy Sector Gription acetabular component size 50 with a single screw, then a 32 +0 neutral polyethylene liner, size 10 Corail femoral component with standard offset, size 32 +1 ceramic hip ball.  SURGEON:  Lind Guest. Ninfa Linden, M.D.  ASSISTANT:  Erskine Emery, PA-C.  ANESTHESIA:  Spinal.  ANTIBIOTICS:  IV Ancef 2 g.  BLOOD LOSS:  250 mL.  COMPLICATIONS:  None.  INDICATIONS:  [name] is a 76 year old female with debilitating arthritis involving her right hip.  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  Her pain is daily and it has detrimentally affected her activities of daily living, her quality of life, and her mobility.  At this point, she does wish to proceed with a total hip arthroplasty through direct anterior approach. She understands fully the risks of acute blood loss anemia, nerve and vessel injury, fracture, infection, dislocation, and DVT.  She understands our goals are to decrease pain, improve mobility, and overall improved quality of life.  PROCEDURE DESCRIPTION:  After informed consent was obtained, appropriate right hip was marked.  She was brought to the operating room, where spinal anesthesia was obtained while she was on her stretcher.  A Foley catheter was placed and both feet had  traction boots applied to them. She was next placed supine on the Hana fracture table with the perineal post in place and both legs in InLine skeletal traction devices, but no traction applied.  Her right operative hip was prepped and draped with DuraPrep and sterile drapes.  Time-out was called and she was identified as correct patient and correct right hip.  I then made my incision just inferior and posterior to the anterior superior iliac spine and carried this obliquely down the leg.  We dissected down to tensor fascia lata muscle.  Tensor fascia was then divided longitudinally to proceed with a direct anterior approach to the hip.  We identified and cauterized circumflex vessels and then identified the hip capsule.  I opened up the hip capsule in an L-type format, finding a very large joint effusion and significant disease in her right hip.  There was definitely protrusio as well.  We placed Cobra retractor around the medial and lateral femoral neck and then made our femoral neck cut proximal to the lesser trochanter with an oscillating saw and completed this with an osteotome. I placed a corkscrew guide in the femoral head, removed the femoral head in its entirety, and found to be completely devoid of cartilage.  We then cleaned the acetabular remnants of bone and  other debris as well as the acetabular labrum.  I placed a bent Hohmann over the medial acetabular rim and then began reaming under direct visualization from a size 42 reamer in stepwise increments up to a size 50 with all reamers under direct visualization, and the last reamer under direct fluoroscopy, so I could obtain my depth of reaming, my inclination, and anteversion.  Once I was pleased with this, I placed the real DePuy Sector Gription acetabular component size 50 and a single screw.  We placed the real 32 +0 neutral polyethylene liner for that size acetabular component.  Attention was then turned to the femur.   With the leg externally rotated to 120 degrees, extended, and adducted, we were able to place a Mueller retractor medially and a Hohmann retractor behind the greater trochanter.  I released the lateral joint capsule and used a box cutting osteotome to enter the inner femoral canal and a rongeur to lateralize.  We then began broaching from a size 8 broach using Corail broaching system up to a size 10.  With a 10 in place, we trialed a standard offset femoral neck and a 32 +1 hip ball, brought the leg back over and up and reduced into the pelvis with traction and internal rotation.  She is definitely quite tight considering we had to overcome previous leg length discrepancy with her being shorter.  I was pleased with range of motion and stability as well as her leg length measured under direct fluoroscopy.  We then dislocated the hip and removed the trial components.  We were able to place the real Corail femoral component size 10 with standard offset and the real 32 +1 ceramic hip ball and again reduced in the acetabulum.  We then irrigated the soft tissue with normal saline solution using pulsatile lavage.  We closed the joint capsule with interrupted #1 Ethibond suture, followed by running #1 Vicryl in the tensor fascia, 0 Vicryl in the deep tissue, 2-0 Vicryl in the subcutaneous tissue, interrupted staples on the skin. Xeroform and Aquacel dressing were applied.  She was taken off the Hana table and taken to the recovery room in stable condition.  All final counts were correct.  There were no complications noted.     Lind Guest. Ninfa Linden, M.D.     CYB/MEDQ  D:  08/22/2017  T:  08/23/2017  Job:  [id number]

## 2017-08-29 ENCOUNTER — Telehealth: Payer: Self-pay | Admitting: Pulmonary Disease

## 2017-08-29 NOTE — Telephone Encounter (Signed)
Called and spoke with pts daughter and she stated that the pt is now in rehab and she stated that the pt seem to be having some issues with her mind.  They stated to the daughter that she has a small UTI, but didn't think that that would be causing the pt to act this way.  Daughter was advised that we do not have any new test results, so she may want to speak with the provider that is covering her mother in that facility.  She will do that.

## 2017-08-29 NOTE — Progress Notes (Signed)
Letter sent due to not being able to contact patient.

## 2017-09-04 ENCOUNTER — Inpatient Hospital Stay (INDEPENDENT_AMBULATORY_CARE_PROVIDER_SITE_OTHER): Payer: Medicare Other | Admitting: Orthopaedic Surgery

## 2017-09-09 ENCOUNTER — Ambulatory Visit (INDEPENDENT_AMBULATORY_CARE_PROVIDER_SITE_OTHER): Payer: Medicare Other | Admitting: Orthopaedic Surgery

## 2017-09-09 ENCOUNTER — Encounter (INDEPENDENT_AMBULATORY_CARE_PROVIDER_SITE_OTHER): Payer: Self-pay | Admitting: Orthopaedic Surgery

## 2017-09-09 DIAGNOSIS — Z96641 Presence of right artificial hip joint: Secondary | ICD-10-CM

## 2017-09-09 NOTE — Progress Notes (Signed)
The patient returns today just over 2 weeks status post a right hip replacement.  She has been staying in a nursing facility.  She is ambulating with a rolling walker.  She was treated apparently for urinary tract infection while she was there for 5 days.  On examination her right hip incision looks great.  I removed the staples apply Steri-Strips.  However she has significant maceration around her perineal and groin areas and I am concerned about this.  I gave her notes to the nursing facility to work on skin care in this area to keep this area dry.  I will put her on 3 days of Diflucan as a yeast medication as well.  I can easily put her right hip the range of motion her leg lengths are equal.  All questions and concerns were answered and addressed.  I will see her back in 3 weeks for repeat exam.  No x-rays are needed.

## 2017-09-10 ENCOUNTER — Ambulatory Visit: Payer: Medicare Other | Admitting: Pulmonary Disease

## 2017-09-10 NOTE — Progress Notes (Deleted)
Subjective:    Patient ID: [name], female    DOB: 06/25/1941, 76 y.o.   MRN: [id number] Synopsis: Refer to [facility] in 2016 for evaluation of shortness of breath. Pulmonary function testing showed an isolated diffusion defect in September 2016, July 2016 echocardiogram suggested pulmonary hypertension.  She still smokes as of 07/2017.  She also has a history of aortic valve repair.    HPI No chief complaint on file.  ***   Past Medical History:  Diagnosis Date  . Abdominal pain, unspecified site   . Anemia    Iron deficiency anemia  . Anxiety   . Aortic stenosis   . Arthritis   . CAD (coronary artery disease)    psot bypass x2 to diagonal and obtuse marginal. SVG graft-01/08/2009  . Depression   . Dyspnea   . GERD (gastroesophageal reflux disease)   . History of cardiovascular stress test    Myoview 7/16:  Low risk, no ischemia or scar, EF 72%  . History of echocardiogram    Echo 7/16:  EF 55-60%, no RWMA, Gr 2 DD, AVR ok (mean 20 mmHg), PASP 40 mmHg  . History of GI bleed    With AVM  . Hypercholesterolemia   . Hyperlipidemia   . Hypertension   . Iron deficiency anemia   . Myocardial infarction ([place]) 2010  . Neuromuscular disorder (HCC)    numbness in hands  . Peripheral vascular disease ([place])    righ tleg  . Shoulder impingement syndrome    Right shoulder      Review of Systems  Constitutional: Negative for chills, fatigue and fever.  HENT: Negative for postnasal drip, rhinorrhea and sinus pressure.   Respiratory: Positive for shortness of breath. Negative for cough and wheezing.   Cardiovascular: Positive for leg swelling. Negative for chest pain and palpitations.       Objective:   Physical Exam There were no vitals filed for this visit.  ***  Cardiology records reviewed where she was seen for her coronary artery disease, and aortic valvular disease  Chest imaging: 2017 Screening CT chest images reviewed: s/p aortic valve  replacement, RADS2, perhaps mild centrilobular emphysema, likely PAH, soft tissue swelling, perhaps peri-aortic lymph node near enlarged ascending aora  Echo: July 2016 echocardiogram shows slightly elevated PA pressure, upper limit of normal aortic valve gradient  Labs: July 2016 hemoglobin 14.4  Right Heart Catheterization 08/11/15  RHC RA = 7 RV = 49/0/10 PA = 53/14 (29) PCW = 9 Fick cardiac output/index = 3.2/1.9 PVR = 6.4 WU Ao sat = 96% PA sat = 56%, 57%   Nuclear stress test: Myoview 03/2015  Myoview Study Highlights 03/2015   Nuclear stress EF: 72%.  There was no ST segment deviation noted during stress.  The study is normal.  This is a low risk study.  The left ventricular ejection fraction is hyperdynamic (>65%).  Low risk study Normal resting and stress perfusion with no ischemia or infarction EF 72%    PFT: 03/2017 Ratio 85% FEV 0.9L (69% pred), FVC 1.06 L (62% pred), TLC: 2.55L (62% pred), ERV -78%?; couldn't perform DLCO      Assessment & Plan:   No diagnosis found.  ***    Current Outpatient Medications:  .  acetaminophen (TYLENOL) 325 MG tablet, Take 2 tablets (650 mg total) by mouth every 6 (six) hours as needed for mild pain (or Fever >/= 101)., Disp: , Rfl:  .  amLODipine (NORVASC) 10 MG tablet, TAKE  1 TABLET BY MOUTH EVERY DAY, Disp: 30 tablet, Rfl: 11 .  aspirin EC 325 MG EC tablet, Take 1 tablet (325 mg total) by mouth daily with breakfast., Disp: 30 tablet, Rfl: 0 .  atorvastatin (LIPITOR) 80 MG tablet, TAKE 1 TABLET BY MOUTH EVERY DAY, Disp: 30 tablet, Rfl: 12 .  BYSTOLIC 5 MG tablet, TAKE 1 TABLET BY MOUTH EVERY DAY, Disp: 90 tablet, Rfl: 2 .  Calcium Carbonate-Vitamin D (CALCIUM 600+D) 600-400 MG-UNIT per tablet, Take 1 tablet by mouth daily., Disp: , Rfl:  .  ferrous sulfate 325 (65 FE) MG tablet, Take 1 tablet (325 mg total) by mouth 2 (two) times daily with a meal. (Patient taking differently: Take 325 mg daily with breakfast by mouth.  ), Disp: 60 tablet, Rfl: 0 .  furosemide (LASIX) 20 MG tablet, Take 20 mg daily by mouth. , Disp: , Rfl:  .  glycopyrrolate (ROBINUL) 2 MG tablet, Take 2 mg by mouth 2 (two) times daily., Disp: , Rfl: 1 .  lisinopril-hydrochlorothiazide (PRINZIDE,ZESTORETIC) 20-25 MG tablet, TAKE 1/2 TABLET BY MOUTH TWICE A DAY, Disp: 90 tablet, Rfl: 2 .  LORazepam (ATIVAN) 0.5 MG tablet, Take 0.5 mg by mouth at bedtime as needed for anxiety or sleep. , Disp: , Rfl:  .  Multiple Vitamin (MULTIVITAMIN) tablet, Take 1 tablet by mouth daily., Disp: , Rfl:  .  omeprazole (PRILOSEC) 40 MG capsule, Take 1 capsule (40 mg total) by mouth 2 (two) times daily., Disp: 60 capsule, Rfl: 0 .  oxyCODONE-acetaminophen (ROXICET) 5-325 MG tablet, Take 1-2 tablets by mouth every 4 (four) hours as needed., Disp: 60 tablet, Rfl: 0 .  potassium chloride (MICRO-K) 10 MEQ CR capsule, TAKE 2 CAPSULES BY MOUTH TWICE A DAY (Patient taking differently: TAKE 1 CAPSULES BY MOUTH DAILY), Disp: 120 capsule, Rfl: 10 .  sertraline (ZOLOFT) 100 MG tablet, TAKE 2 TABLETS (200MG ) BY MOUTH ONCE A DAY, Disp: , Rfl: 1 .  tiotropium (SPIRIVA HANDIHALER) 18 MCG inhalation capsule, Place 1 capsule (18 mcg total) into inhaler and inhale every morning., Disp: 30 capsule, Rfl: 12

## 2017-09-15 ENCOUNTER — Other Ambulatory Visit: Payer: Self-pay | Admitting: Cardiology

## 2017-09-29 ENCOUNTER — Inpatient Hospital Stay (HOSPITAL_COMMUNITY)
Admission: EM | Admit: 2017-09-29 | Discharge: 2017-10-03 | DRG: 391 | Disposition: A | Discharge status: Home / Self Care | Payer: Medicare Other | Attending: Nephrology | Admitting: Nephrology

## 2017-09-29 ENCOUNTER — Other Ambulatory Visit: Payer: Self-pay

## 2017-09-29 ENCOUNTER — Encounter (HOSPITAL_COMMUNITY): Payer: Self-pay | Admitting: *Deleted

## 2017-09-29 DIAGNOSIS — K31819 Angiodysplasia of stomach and duodenum without bleeding: Secondary | ICD-10-CM | POA: Diagnosis not present

## 2017-09-29 DIAGNOSIS — F419 Anxiety disorder, unspecified: Secondary | ICD-10-CM | POA: Diagnosis not present

## 2017-09-29 DIAGNOSIS — Z7982 Long term (current) use of aspirin: Secondary | ICD-10-CM

## 2017-09-29 DIAGNOSIS — E785 Hyperlipidemia, unspecified: Secondary | ICD-10-CM | POA: Diagnosis present

## 2017-09-29 DIAGNOSIS — Z79899 Other long term (current) drug therapy: Secondary | ICD-10-CM

## 2017-09-29 DIAGNOSIS — N179 Acute kidney failure, unspecified: Secondary | ICD-10-CM | POA: Diagnosis present

## 2017-09-29 DIAGNOSIS — I252 Old myocardial infarction: Secondary | ICD-10-CM

## 2017-09-29 DIAGNOSIS — Z90710 Acquired absence of both cervix and uterus: Secondary | ICD-10-CM

## 2017-09-29 DIAGNOSIS — Z885 Allergy status to narcotic agent status: Secondary | ICD-10-CM

## 2017-09-29 DIAGNOSIS — I10 Essential (primary) hypertension: Secondary | ICD-10-CM | POA: Diagnosis present

## 2017-09-29 DIAGNOSIS — Z9049 Acquired absence of other specified parts of digestive tract: Secondary | ICD-10-CM

## 2017-09-29 DIAGNOSIS — G9349 Other encephalopathy: Secondary | ICD-10-CM | POA: Diagnosis present

## 2017-09-29 DIAGNOSIS — K5521 Angiodysplasia of colon with hemorrhage: Secondary | ICD-10-CM

## 2017-09-29 DIAGNOSIS — Z952 Presence of prosthetic heart valve: Secondary | ICD-10-CM

## 2017-09-29 DIAGNOSIS — I9589 Other hypotension: Secondary | ICD-10-CM

## 2017-09-29 DIAGNOSIS — F329 Major depressive disorder, single episode, unspecified: Secondary | ICD-10-CM | POA: Diagnosis present

## 2017-09-29 DIAGNOSIS — Z8249 Family history of ischemic heart disease and other diseases of the circulatory system: Secondary | ICD-10-CM

## 2017-09-29 DIAGNOSIS — G4733 Obstructive sleep apnea (adult) (pediatric): Secondary | ICD-10-CM | POA: Diagnosis present

## 2017-09-29 DIAGNOSIS — E876 Hypokalemia: Secondary | ICD-10-CM | POA: Diagnosis present

## 2017-09-29 DIAGNOSIS — Z96641 Presence of right artificial hip joint: Secondary | ICD-10-CM | POA: Diagnosis present

## 2017-09-29 DIAGNOSIS — I2510 Atherosclerotic heart disease of native coronary artery without angina pectoris: Secondary | ICD-10-CM | POA: Diagnosis present

## 2017-09-29 DIAGNOSIS — Z7951 Long term (current) use of inhaled steroids: Secondary | ICD-10-CM

## 2017-09-29 DIAGNOSIS — E861 Hypovolemia: Secondary | ICD-10-CM | POA: Diagnosis present

## 2017-09-29 DIAGNOSIS — R109 Unspecified abdominal pain: Secondary | ICD-10-CM

## 2017-09-29 DIAGNOSIS — D62 Acute posthemorrhagic anemia: Secondary | ICD-10-CM | POA: Diagnosis present

## 2017-09-29 DIAGNOSIS — F17210 Nicotine dependence, cigarettes, uncomplicated: Secondary | ICD-10-CM | POA: Diagnosis present

## 2017-09-29 DIAGNOSIS — K219 Gastro-esophageal reflux disease without esophagitis: Secondary | ICD-10-CM | POA: Diagnosis present

## 2017-09-29 DIAGNOSIS — I350 Nonrheumatic aortic (valve) stenosis: Secondary | ICD-10-CM | POA: Diagnosis present

## 2017-09-29 DIAGNOSIS — G9340 Encephalopathy, unspecified: Secondary | ICD-10-CM | POA: Diagnosis present

## 2017-09-29 DIAGNOSIS — E872 Acidosis: Secondary | ICD-10-CM | POA: Diagnosis present

## 2017-09-29 DIAGNOSIS — Z8711 Personal history of peptic ulcer disease: Secondary | ICD-10-CM

## 2017-09-29 DIAGNOSIS — R578 Other shock: Secondary | ICD-10-CM | POA: Diagnosis present

## 2017-09-29 DIAGNOSIS — I739 Peripheral vascular disease, unspecified: Secondary | ICD-10-CM | POA: Diagnosis present

## 2017-09-29 DIAGNOSIS — K922 Gastrointestinal hemorrhage, unspecified: Secondary | ICD-10-CM

## 2017-09-29 DIAGNOSIS — Z951 Presence of aortocoronary bypass graft: Secondary | ICD-10-CM

## 2017-09-29 LAB — COMPREHENSIVE METABOLIC PANEL WITH GFR
ALT: 46 U/L (ref 14–54)
AST: 67 U/L — ABNORMAL HIGH (ref 15–41)
Albumin: 2.9 g/dL — ABNORMAL LOW (ref 3.5–5.0)
Alkaline Phosphatase: 72 U/L (ref 38–126)
Anion gap: 13 (ref 5–15)
BUN: 27 mg/dL — ABNORMAL HIGH (ref 6–20)
CO2: 16 mmol/L — ABNORMAL LOW (ref 22–32)
Calcium: 8.4 mg/dL — ABNORMAL LOW (ref 8.9–10.3)
Chloride: 108 mmol/L (ref 101–111)
Creatinine, Ser: 1.37 mg/dL — ABNORMAL HIGH (ref 0.44–1.00)
GFR calc Af Amer: 42 mL/min — ABNORMAL LOW
GFR calc non Af Amer: 36 mL/min — ABNORMAL LOW
Glucose, Bld: 132 mg/dL — ABNORMAL HIGH (ref 65–99)
Potassium: 4.1 mmol/L (ref 3.5–5.1)
Sodium: 137 mmol/L (ref 135–145)
Total Bilirubin: 0.4 mg/dL (ref 0.3–1.2)
Total Protein: 6.1 g/dL — ABNORMAL LOW (ref 6.5–8.1)

## 2017-09-29 LAB — I-STAT CG4 LACTIC ACID, ED: Lactic Acid, Venous: 5.39 mmol/L (ref 0.5–1.9)

## 2017-09-29 LAB — POC OCCULT BLOOD, ED: FECAL OCCULT BLD: POSITIVE — AB

## 2017-09-29 LAB — CBG MONITORING, ED: Glucose-Capillary: 143 mg/dL — ABNORMAL HIGH (ref 65–99)

## 2017-09-29 MED ORDER — SODIUM CHLORIDE 0.9 % IV SOLN
80.0000 mg | Freq: Once | INTRAVENOUS | Status: AC
  Administered 2017-09-30: 80 mg via INTRAVENOUS
  Filled 2017-09-29: qty 80

## 2017-09-29 MED ORDER — SODIUM CHLORIDE 0.9 % IV SOLN
10.0000 mL/h | Freq: Once | INTRAVENOUS | Status: AC
  Administered 2017-10-01: 10 mL/h via INTRAVENOUS
  Administered 2017-10-01: 12:00:00 via INTRAVENOUS

## 2017-09-29 MED ORDER — SODIUM CHLORIDE 0.9 % IV SOLN
8.0000 mg/h | INTRAVENOUS | Status: DC
  Administered 2017-09-30: 8 mg/h via INTRAVENOUS
  Filled 2017-09-29 (×2): qty 80

## 2017-09-29 MED ORDER — PANTOPRAZOLE SODIUM 40 MG IV SOLR: 40.0000 mg | Freq: Two times a day (BID) | INTRAVENOUS | Status: DC

## 2017-09-29 MED ORDER — SODIUM CHLORIDE 0.9 % IV BOLUS (SEPSIS)
2000.0000 mL | Freq: Once | INTRAVENOUS | Status: AC
  Administered 2017-09-29: 2000 mL via INTRAVENOUS

## 2017-09-29 NOTE — ED Notes (Signed)
Pt's lactic results given to [place]

## 2017-09-29 NOTE — ED Notes (Signed)
ED Provider at bedside. 

## 2017-09-29 NOTE — ED Triage Notes (Signed)
Pt to ED from home by EMS with lethargy and generalized weakness for the past few days. Pt lives with her daughter and has had decreased PO intake. Pt is easy to arouse and appropriately answers questions. Per EMS, pt had hip surgery in November and is c/o leg pain. Initial bp 102/82 that decreased to 70 systolic. EMS gave 525ml NS enroute

## 2017-09-29 NOTE — ED Provider Notes (Signed)
[facility] EMERGENCY DEPARTMENT Provider Note   CSN: [id number] Arrival date & time: 09/29/17  2219     History   Chief Complaint Chief Complaint  Patient presents with  . Weakness  . Hypotension    HPI [name] is a 77 y.o. female.  The history is provided by a relative. The history is limited by the condition of the patient (Altered mental status).  Weakness   She has a history of hypertension, hyperlipidemia, myocardial infarction, peripheral vascular disease, aortic stenosis and is 6 weeks status post right total hip arthroplasty is brought in because of altered mentation.  Caregiver had noted dark, tarry stools for the last 2 weeks.  3 days ago, she fell out of bed.  Since then, she has not been eating or drinking.  Tonight, she tried to help the patient into bed and a base patient collapsed.  Of note, she is not on any anticoagulants.  There has been no vomiting or diarrhea.  There has been no known fever.  Past Medical History:  Diagnosis Date  . Abdominal pain, unspecified site   . Anemia    Iron deficiency anemia  . Anxiety   . Aortic stenosis   . Arthritis   . CAD (coronary artery disease)    psot bypass x2 to diagonal and obtuse marginal. SVG graft-01/08/2009  . Depression   . Dyspnea   . GERD (gastroesophageal reflux disease)   . History of cardiovascular stress test    Myoview 7/16:  Low risk, no ischemia or scar, EF 72%  . History of echocardiogram    Echo 7/16:  EF 55-60%, no RWMA, Gr 2 DD, AVR ok (mean 20 mmHg), PASP 40 mmHg  . History of GI bleed    With AVM  . Hypercholesterolemia   . Hyperlipidemia   . Hypertension   . Iron deficiency anemia   . Myocardial infarction ([place]) 2010  . Neuromuscular disorder (HCC)    numbness in hands  . Peripheral vascular disease ([place])    righ tleg  . Shoulder impingement syndrome    Right shoulder    Patient Active Problem List   Diagnosis Date Noted  . 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 Symptomatic anemia 04/10/2017  . Generalized weakness   . Heme positive stool   . 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 Epigastric burning sensation 04/19/2015  . Chronic diarrhea 04/19/2015  . History of GI bleed 03/14/2015  . Long-term use of high-risk medication 01/11/2015  . Dysphagia 01/11/2015  . S/P AVR 01/11/2015  . Dyspnea 01/11/2015  . Tobacco abuse 01/11/2015  . Numbness of anterior thigh-Left 12/09/2014  . Pain in joint, lower leg 12/09/2014  . Essential hypertension 07/14/2014  . Coronary artery disease involving coronary bypass graft of native heart without angina pectoris 07/14/2014  . Dilated aortic root ([place]) 07/14/2014  . H/O aortic valve replacement 07/14/2014  . Tobacco use 07/14/2014  . Obesity 07/14/2014  . Palpitations 01/03/2014  . Aortic valve disorder 06/19/2013  . Heart valve replaced by other means 06/19/2013  . Flatulence, eructation, and gas pain 06/19/2013  . Pure hypercholesterolemia 06/19/2013  . 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 Hypopotassemia 06/19/2013  . Anemia, unspecified 06/19/2013  . Anxiety state, unspecified 06/19/2013  . Esophageal reflux 06/19/2013  . Personal history of other diseases of digestive system 06/19/2013  . Iron deficiency anemia, unspecified 06/19/2013  . Proteinuria 06/19/2013  . Arthropathy, unspecified, site  unspecified 06/19/2013  . Major depressive disorder, single episode, unspecified 06/19/2013  . 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 Tobacco use disorder 06/19/2013  . Peripheral vascular disease ([place]) 10/14/2012  . Atherosclerosis of aorta ([place]) 10/14/2012  . Pain in limb 10/14/2012  . Atherosclerosis of abdominal aorta ([place]) 10/16/2011  . PVD (peripheral vascular disease) ([place]) 10/16/2011    Past Surgical History:  Procedure Laterality Date  . ABDOMINAL HYSTERECTOMY    . AORTIC VALVE REPLACEMENT     [facility] #[id number]  . CARDIAC CATHETERIZATION N/A  08/11/2015   Procedure: Right Heart Cath;  Surgeon: Jolaine Artist, MD;  Location: [facility];  Service: Cardiovascular;  Laterality: N/A;  . CARDIAC SURGERY  2010   , 2 blockages  . CHOLECYSTECTOMY    . COLONOSCOPY N/A 04/12/2017   Procedure: COLONOSCOPY;  Surgeon: Manus Gunning, MD;  Location: [facility] ENDOSCOPY;  Service: Gastroenterology;  Laterality: N/A;  . CORONARY ARTERY BYPASS GRAFT    . ESOPHAGOGASTRODUODENOSCOPY N/A 04/12/2017   Procedure: ESOPHAGOGASTRODUODENOSCOPY (EGD);  Surgeon: Manus Gunning, MD;  Location: [facility];  Service: Gastroenterology;  Laterality: N/A;  . HEEL SPUR EXCISION  2000   left heel  . SPINE SURGERY  2003   Herniated diskectomy  . TOTAL HIP ARTHROPLASTY Right 08/22/2017   Procedure: RIGHT TOTAL HIP ARTHROPLASTY ANTERIOR APPROACH;  Surgeon: Mcarthur Rossetti, MD;  Location: [facility];  Service: Orthopedics;  Laterality: Right;  . WRIST SURGERY     right wrist    OB History    No data available       Home Medications    Prior to Admission medications   Medication Sig Start Date End Date Taking? Authorizing Provider  acetaminophen (TYLENOL) 325 MG tablet Take 2 tablets (650 mg total) by mouth every 6 (six) hours as needed for mild pain (or Fever >/= 101). 04/12/17   Rosita Fire, MD  amLODipine (NORVASC) 10 MG tablet TAKE 1 TABLET BY MOUTH EVERY DAY 08/04/17   Jerline Pain, MD  aspirin EC 325 MG EC tablet Take 1 tablet (325 mg total) by mouth daily with breakfast. 08/25/17   Mcarthur Rossetti, MD  atorvastatin (LIPITOR) 80 MG tablet TAKE 1 TABLET BY MOUTH EVERY DAY 08/06/17   Jerline Pain, MD  BYSTOLIC 5 MG tablet TAKE 1 TABLET BY MOUTH EVERY DAY 06/30/17   Jerline Pain, MD  Calcium Carbonate-Vitamin D (CALCIUM 600+D) 600-400 MG-UNIT per tablet Take 1 tablet by mouth daily.    [provider]  ferrous sulfate 325 (65 FE) MG tablet Take 1 tablet (325 mg total) by mouth 2 (two) times daily with  a meal. Patient taking differently: Take 325 mg daily with breakfast by mouth.  04/12/17   Rosita Fire, MD  furosemide (LASIX) 20 MG tablet Take 20 mg daily by mouth.     [provider]  glycopyrrolate (ROBINUL) 2 MG tablet Take 2 mg by mouth 2 (two) times daily. 12/09/14   [provider]  lisinopril-hydrochlorothiazide (PRINZIDE,ZESTORETIC) 20-25 MG tablet TAKE 1/2 TABLET BY MOUTH TWICE A DAY 08/18/17   Jerline Pain, MD  LORazepam (ATIVAN) 0.5 MG tablet Take 0.5 mg by mouth at bedtime as needed for anxiety or sleep.     [provider]  Multiple Vitamin (MULTIVITAMIN) tablet Take 1 tablet by mouth daily.    [provider]  omeprazole (PRILOSEC) 40 MG capsule Take 1 capsule (40 mg total) by mouth 2 (two) times daily. 04/12/17   Carolin Sicks,  Osie Bond, MD  oxyCODONE-acetaminophen (ROXICET) 5-325 MG tablet Take 1-2 tablets by mouth every 4 (four) hours as needed. 08/25/17   Mcarthur Rossetti, MD  potassium chloride (MICRO-K) 10 MEQ CR capsule TAKE 2 CAPSULES BY MOUTH TWICE A DAY 09/15/17   Jerline Pain, MD  sertraline (ZOLOFT) 100 MG tablet TAKE 2 TABLETS (200MG ) BY MOUTH ONCE A DAY 10/30/15   [provider]  tiotropium (SPIRIVA HANDIHALER) 18 MCG inhalation capsule Place 1 capsule (18 mcg total) into inhaler and inhale every morning. 04/09/17   Donita Brooks, NP    Family History Family History  Problem Relation Age of Onset  . Hypertension Mother   . Kidney disease Mother   . Hypertension Father   . Hypertension Sister   . Hypertension Brother   . Hypertension Daughter   . Diabetes Brother   . Heart attack Neg Hx   . Stroke Neg Hx     Social History Social History   Tobacco Use  . Smoking status: Current Some Day Smoker    Packs/day: 1.00    Years: 52.00    Pack years: 52.00    Types: Cigarettes    Last attempt to quit: 01/21/2017    Years since quitting: 0.6  . Smokeless tobacco: Never Used  . Tobacco comment:  "might borrow a cigarette from time to time"  Substance Use Topics  . Alcohol use: No    Alcohol/week: 0.0 oz  . Drug use: No     Allergies   Codeine   Review of Systems Review of Systems  Unable to perform ROS: Mental status change  Neurological: Positive for weakness.     Physical Exam Updated Vital Signs BP (!) 75/43   Pulse 69   Temp 97.6 F (36.4 C)   Resp (!) 22   LMP  (LMP Unknown)   SpO2 100%   Physical Exam  Nursing note and vitals reviewed.  77 year old female, resting comfortably and in no acute distress. Vital signs are significant for hypotension and tachypnea. Oxygen saturation is 100%, which is normal. Head is normocephalic and atraumatic. PERRLA, EOMI. Oropharynx is clear.  Conjunctivae are pale. Neck is nontender and supple without adenopathy or JVD. Back is nontender and there is no CVA tenderness. Lungs are clear without rales, wheezes, or rhonchi. Chest is nontender. Heart has regular rate and rhythm without murmur. Abdomen is soft, flat, nontender without masses or hepatosplenomegaly and peristalsis is normoactive. Rectal: Normal sphincter tone.  Stool is black and Hemoccult positive. Extremities have no cyanosis or edema, full range of motion is present.  Surgical scar on right hip is healing well without sign of infection. Skin is warm and dry without rash. Neurologic: She is drowsy but arousable and oriented to person but not place or time, cranial nerves are intact, there are no motor or sensory deficits.  ED Treatments / Results  Labs (all labs ordered are listed, but only abnormal results are displayed) Labs Reviewed  COMPREHENSIVE METABOLIC PANEL - Abnormal; Notable for the following components:      Result Value   CO2 16 (*)    Glucose, Bld 132 (*)    BUN 27 (*)    Creatinine, Ser 1.37 (*)    Calcium 8.4 (*)    Total Protein 6.1 (*)    Albumin 2.9 (*)    AST 67 (*)    GFR calc non Af Amer 36 (*)    GFR calc Af Amer 42 (*)  All other components within normal limits  CBC WITH DIFFERENTIAL/PLATELET - Abnormal; Notable for the following components:   RBC 1.81 (*)    Hemoglobin 4.4 (*)    HCT 15.3 (*)    MCH 24.3 (*)    MCHC 28.8 (*)    RDW 17.3 (*)    All other components within normal limits  CBG MONITORING, ED - Abnormal; Notable for the following components:   Glucose-Capillary 143 (*)    All other components within normal limits  I-STAT CG4 LACTIC ACID, ED - Abnormal; Notable for the following components:   Lactic Acid, Venous 5.39 (*)    All other components within normal limits  POC OCCULT BLOOD, ED - Abnormal; Notable for the following components:   Fecal Occult Bld POSITIVE (*)    All other components within normal limits  URINALYSIS, ROUTINE W REFLEX MICROSCOPIC  CBC  CBC  CBC  CBC  PROTIME-INR  APTT  LACTIC ACID, PLASMA  LACTIC ACID, PLASMA  BASIC METABOLIC PANEL  MAGNESIUM  PHOSPHORUS  TYPE AND SCREEN  PREPARE RBC (CROSSMATCH)    EKG  EKG Interpretation  Date/Time:  Monday September 29 2017 22:45:15 EST Ventricular Rate:  72 PR Interval:    QRS Duration: 91 QT Interval:  424 QTC Calculation: 464 R Axis:   80 Text Interpretation:  Sinus rhythm Atrial premature complex Low voltage, precordial leads Minimal ST depression, lateral leads When compared with ECG of 04/10/2017, No significant change was found Confirmed by Delora Fuel ([id number]) on 09/29/2017 11:07:18 PM       Procedures Procedures (including critical care time) CRITICAL CARE Performed by: Delora Fuel Total critical care time: 60 minutes Critical care time was exclusive of separately billable procedures and treating other patients. Critical care was necessary to treat or prevent imminent or life-threatening deterioration. Critical care was time spent personally by me on the following activities: development of treatment plan with patient and/or surrogate as well as nursing, discussions with consultants, evaluation of  patient's response to treatment, examination of patient, obtaining history from patient or surrogate, ordering and performing treatments and interventions, ordering and review of laboratory studies, ordering and review of radiographic studies, pulse oximetry and re-evaluation of patient's condition.  Medications Ordered in ED Medications  sodium chloride 0.9 % bolus 2,000 mL (not administered)     Initial Impression / Assessment and Plan / ED Course  I have reviewed the triage vital signs and the nursing notes.  Pertinent lab results that were available during my care of the patient were reviewed by me and considered in my medical decision making (see chart for details).  Severe hypotension which seems to be due to combination of GI bleeding and poor oral intake.  Initial lactic acid level is elevated and this is felt to be due to hypotension and not sepsis.  Old records are reviewed confirming right total hip arthroplasty done November 30.  Review of discharge medications confirms no anticoagulants.  She is started with aggressive IV hydration.  If blood pressure does not come up with hydration alone, will need emergent transfusion.  She is started on pantoprazole infusion for presumed upper GI bleed.  Blood pressure initially came up with hydration, but then fell back down into the 70s.  Hemoglobin has come back at 4.4, which is a drop from her baseline of 8.1.  With first unit of packed red blood cells, blood pressure is still not coming up.  Case had been discussed with Dr. Hal Hope of [facility], who  was concerned that she needed a higher level of care.  Case was discussed with Dr. Janann Colonel of critical care service who agrees to accept the patient.  Also, family does relate she has a prior history of GI bleed.  Review of records shows hospitalization in July 2019 for symptomatic anemia which was presumed due to gastric ulcer seen on EGD.  Final Clinical Impressions(s) / ED Diagnoses    Final diagnoses:  UGI bleed  Hypotension due to hypovolemia    ED Discharge Orders    None       Delora Fuel, MD 68/12/75 2251

## 2017-09-30 ENCOUNTER — Ambulatory Visit (INDEPENDENT_AMBULATORY_CARE_PROVIDER_SITE_OTHER): Payer: Medicare Other | Admitting: Orthopaedic Surgery

## 2017-09-30 DIAGNOSIS — N179 Acute kidney failure, unspecified: Secondary | ICD-10-CM | POA: Diagnosis present

## 2017-09-30 DIAGNOSIS — E861 Hypovolemia: Secondary | ICD-10-CM | POA: Diagnosis present

## 2017-09-30 DIAGNOSIS — F17210 Nicotine dependence, cigarettes, uncomplicated: Secondary | ICD-10-CM | POA: Diagnosis present

## 2017-09-30 DIAGNOSIS — I252 Old myocardial infarction: Secondary | ICD-10-CM | POA: Diagnosis not present

## 2017-09-30 DIAGNOSIS — G9340 Encephalopathy, unspecified: Secondary | ICD-10-CM | POA: Diagnosis present

## 2017-09-30 DIAGNOSIS — E876 Hypokalemia: Secondary | ICD-10-CM | POA: Diagnosis present

## 2017-09-30 DIAGNOSIS — D62 Acute posthemorrhagic anemia: Secondary | ICD-10-CM | POA: Diagnosis present

## 2017-09-30 DIAGNOSIS — F329 Major depressive disorder, single episode, unspecified: Secondary | ICD-10-CM | POA: Diagnosis present

## 2017-09-30 DIAGNOSIS — I10 Essential (primary) hypertension: Secondary | ICD-10-CM | POA: Diagnosis present

## 2017-09-30 DIAGNOSIS — K921 Melena: Secondary | ICD-10-CM

## 2017-09-30 DIAGNOSIS — Z9049 Acquired absence of other specified parts of digestive tract: Secondary | ICD-10-CM | POA: Diagnosis not present

## 2017-09-30 DIAGNOSIS — G4733 Obstructive sleep apnea (adult) (pediatric): Secondary | ICD-10-CM | POA: Diagnosis present

## 2017-09-30 DIAGNOSIS — I2510 Atherosclerotic heart disease of native coronary artery without angina pectoris: Secondary | ICD-10-CM | POA: Diagnosis present

## 2017-09-30 DIAGNOSIS — I739 Peripheral vascular disease, unspecified: Secondary | ICD-10-CM | POA: Diagnosis present

## 2017-09-30 DIAGNOSIS — K219 Gastro-esophageal reflux disease without esophagitis: Secondary | ICD-10-CM | POA: Diagnosis present

## 2017-09-30 DIAGNOSIS — Z951 Presence of aortocoronary bypass graft: Secondary | ICD-10-CM | POA: Diagnosis not present

## 2017-09-30 DIAGNOSIS — F419 Anxiety disorder, unspecified: Secondary | ICD-10-CM | POA: Diagnosis present

## 2017-09-30 DIAGNOSIS — R6881 Early satiety: Secondary | ICD-10-CM

## 2017-09-30 DIAGNOSIS — K31819 Angiodysplasia of stomach and duodenum without bleeding: Secondary | ICD-10-CM | POA: Diagnosis present

## 2017-09-30 DIAGNOSIS — Z96641 Presence of right artificial hip joint: Secondary | ICD-10-CM | POA: Diagnosis present

## 2017-09-30 DIAGNOSIS — R109 Unspecified abdominal pain: Secondary | ICD-10-CM

## 2017-09-30 DIAGNOSIS — G9349 Other encephalopathy: Secondary | ICD-10-CM | POA: Diagnosis present

## 2017-09-30 DIAGNOSIS — K922 Gastrointestinal hemorrhage, unspecified: Secondary | ICD-10-CM

## 2017-09-30 DIAGNOSIS — Z7982 Long term (current) use of aspirin: Secondary | ICD-10-CM | POA: Diagnosis not present

## 2017-09-30 DIAGNOSIS — I350 Nonrheumatic aortic (valve) stenosis: Secondary | ICD-10-CM | POA: Diagnosis present

## 2017-09-30 DIAGNOSIS — E872 Acidosis: Secondary | ICD-10-CM | POA: Diagnosis present

## 2017-09-30 DIAGNOSIS — E785 Hyperlipidemia, unspecified: Secondary | ICD-10-CM | POA: Diagnosis present

## 2017-09-30 DIAGNOSIS — Z952 Presence of prosthetic heart valve: Secondary | ICD-10-CM | POA: Diagnosis not present

## 2017-09-30 DIAGNOSIS — R578 Other shock: Secondary | ICD-10-CM | POA: Diagnosis present

## 2017-09-30 DIAGNOSIS — R195 Other fecal abnormalities: Secondary | ICD-10-CM

## 2017-09-30 LAB — PHOSPHORUS: Phosphorus: 4.6 mg/dL (ref 2.5–4.6)

## 2017-09-30 LAB — LACTIC ACID, PLASMA
Lactic Acid, Venous: 5.9 mmol/L (ref 0.5–1.9)
Lactic Acid, Venous: 2.2 mmol/L (ref 0.5–1.9)

## 2017-09-30 LAB — URINALYSIS, ROUTINE W REFLEX MICROSCOPIC
BILIRUBIN URINE: NEGATIVE
GLUCOSE, UA: NEGATIVE mg/dL
HGB URINE DIPSTICK: NEGATIVE
KETONES UR: NEGATIVE mg/dL
Nitrite: NEGATIVE
PH: 5 (ref 5.0–8.0)
Protein, ur: 30 mg/dL — AB
Specific Gravity, Urine: 1.021 (ref 1.005–1.030)

## 2017-09-30 LAB — CBC WITH DIFFERENTIAL/PLATELET
BASOS PCT: 1 %
Basophils Absolute: 0.1 10*3/uL (ref 0.0–0.1)
EOS PCT: 3 %
Eosinophils Absolute: 0.2 10*3/uL (ref 0.0–0.7)
HEMATOCRIT: 15.3 % — AB (ref 36.0–46.0)
Hemoglobin: 4.4 g/dL — CL (ref 12.0–15.0)
Lymphocytes Relative: 20 %
Lymphs Abs: 1.3 10*3/uL (ref 0.7–4.0)
MCH: 24.3 pg — ABNORMAL LOW (ref 26.0–34.0)
MCHC: 28.8 g/dL — ABNORMAL LOW (ref 30.0–36.0)
MCV: 84.5 fL (ref 78.0–100.0)
MONO ABS: 0.7 10*3/uL (ref 0.1–1.0)
MONOS PCT: 10 %
NEUTROS PCT: 66 %
Neutro Abs: 4.2 10*3/uL (ref 1.7–7.7)
PLATELETS: 207 10*3/uL (ref 150–400)
RBC: 1.81 MIL/uL — AB (ref 3.87–5.11)
RDW: 17.3 % — AB (ref 11.5–15.5)
WBC: 6.5 10*3/uL (ref 4.0–10.5)

## 2017-09-30 LAB — CBC
HEMATOCRIT: 24.1 % — AB (ref 36.0–46.0)
Hemoglobin: 7.4 g/dL — ABNORMAL LOW (ref 12.0–15.0)
MCH: 26.6 pg (ref 26.0–34.0)
MCHC: 30.7 g/dL (ref 30.0–36.0)
MCV: 86.7 fL (ref 78.0–100.0)
Platelets: 189 10*3/uL (ref 150–400)
RBC: 2.78 MIL/uL — ABNORMAL LOW (ref 3.87–5.11)
RDW: 16.1 % — AB (ref 11.5–15.5)
WBC: 8.7 10*3/uL (ref 4.0–10.5)

## 2017-09-30 LAB — GLUCOSE, CAPILLARY
GLUCOSE-CAPILLARY: 82 mg/dL (ref 65–99)
Glucose-Capillary: 129 mg/dL — ABNORMAL HIGH (ref 65–99)

## 2017-09-30 LAB — MRSA PCR SCREENING: MRSA by PCR: NEGATIVE

## 2017-09-30 LAB — HEMOGLOBIN AND HEMATOCRIT, BLOOD
HCT: 24.3 % — ABNORMAL LOW (ref 36.0–46.0)
Hemoglobin: 7.4 g/dL — ABNORMAL LOW (ref 12.0–15.0)

## 2017-09-30 LAB — MAGNESIUM: Magnesium: 1.8 mg/dL (ref 1.7–2.4)

## 2017-09-30 LAB — TROPONIN I: TROPONIN I: 0.06 ng/mL — AB (ref <0.03)

## 2017-09-30 LAB — BASIC METABOLIC PANEL
Anion gap: 10 (ref 5–15)
BUN: 26 mg/dL — AB (ref 6–20)
CHLORIDE: 112 mmol/L — AB (ref 101–111)
CO2: 16 mmol/L — ABNORMAL LOW (ref 22–32)
Calcium: 7.9 mg/dL — ABNORMAL LOW (ref 8.9–10.3)
Creatinine, Ser: 1.1 mg/dL — ABNORMAL HIGH (ref 0.44–1.00)
GFR calc Af Amer: 55 mL/min — ABNORMAL LOW (ref >60)
GFR calc non Af Amer: 48 mL/min — ABNORMAL LOW (ref >60)
Glucose, Bld: 89 mg/dL (ref 65–99)
POTASSIUM: 4.2 mmol/L (ref 3.5–5.1)
SODIUM: 138 mmol/L (ref 135–145)

## 2017-09-30 LAB — PROTIME-INR
INR: 1.38
PROTHROMBIN TIME: 16.8 s — AB (ref 11.4–15.2)

## 2017-09-30 LAB — APTT: aPTT: 28 seconds (ref 24–36)

## 2017-09-30 LAB — PREPARE RBC (CROSSMATCH)

## 2017-09-30 MED ORDER — SODIUM CHLORIDE 0.9 % IV SOLN
250.0000 mL | INTRAVENOUS | Status: DC | PRN
  Administered 2017-10-02: 250 mL via INTRAVENOUS

## 2017-09-30 MED ORDER — ORAL CARE MOUTH RINSE: 15.0000 mL | Freq: Two times a day (BID) | OROMUCOSAL | Status: DC

## 2017-09-30 MED ORDER — ORAL CARE MOUTH RINSE
15.0000 mL | Freq: Two times a day (BID) | OROMUCOSAL | Status: DC
  Administered 2017-09-30 (×2): 15 mL via OROMUCOSAL

## 2017-09-30 MED ORDER — CHLORHEXIDINE GLUCONATE 0.12 % MT SOLN
15.0000 mL | Freq: Two times a day (BID) | OROMUCOSAL | Status: DC
  Administered 2017-09-30: 15 mL via OROMUCOSAL

## 2017-09-30 MED ORDER — PANTOPRAZOLE SODIUM 40 MG IV SOLR
40.0000 mg | Freq: Two times a day (BID) | INTRAVENOUS | Status: DC
  Administered 2017-09-30 – 2017-10-01 (×3): 40 mg via INTRAVENOUS
  Filled 2017-09-30 (×4): qty 40

## 2017-09-30 MED ORDER — ACETAMINOPHEN 325 MG PO TABS
650.0000 mg | ORAL_TABLET | Freq: Four times a day (QID) | ORAL | Status: DC | PRN
  Administered 2017-09-30 – 2017-10-02 (×4): 650 mg via ORAL
  Filled 2017-09-30 (×4): qty 2

## 2017-09-30 MED ORDER — TIOTROPIUM BROMIDE MONOHYDRATE 18 MCG IN CAPS
18.0000 ug | ORAL_CAPSULE | Freq: Every day | RESPIRATORY_TRACT | Status: DC
  Administered 2017-09-30 – 2017-10-03 (×4): 18 ug via RESPIRATORY_TRACT
  Filled 2017-09-30: qty 5

## 2017-09-30 MED ORDER — SODIUM CHLORIDE 0.9 % IV BOLUS (SEPSIS)
1000.0000 mL | Freq: Once | INTRAVENOUS | Status: AC
  Administered 2017-09-30: 1000 mL via INTRAVENOUS

## 2017-09-30 MED ORDER — ORAL CARE MOUTH RINSE
15.0000 mL | Freq: Two times a day (BID) | OROMUCOSAL | Status: DC
  Administered 2017-10-01: 15 mL via OROMUCOSAL

## 2017-09-30 NOTE — H&P (Signed)
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 In the ER patient was found to be hypotensive 75/43 and anemic (Hgb 4.4 down from baseline of 8.1). In the ER she was given IV PPI, 3L IVF, and started on 1st unit of pRBC. PCCM was then consulted.   PAST MEDICAL HISTORY :  She  has a past medical history of Abdominal pain, unspecified site, Anemia, Anxiety, Aortic stenosis, Arthritis, CAD (coronary artery disease), Depression, Dyspnea, GERD (gastroesophageal reflux disease), History of cardiovascular stress test, History of echocardiogram, History of GI bleed, Hypercholesterolemia, Hyperlipidemia, Hypertension, Iron deficiency anemia, Myocardial infarction ([place]) (2010), Neuromuscular disorder ([place]), Peripheral vascular disease ([place]), and Shoulder impingement syndrome.  PAST SURGICAL HISTORY: She  has a past surgical history that includes Aortic valve replacement; Cholecystectomy; Spine surgery (2003); Wrist surgery; Excision heel spur excision (2000); Coronary artery bypass graft; Cardiac surgery (2010); Abdominal hysterectomy; Cardiac catheterization (N/A, 08/11/2015); Esophagogastroduodenoscopy (N/A, 04/12/2017); Colonoscopy (N/A, 04/12/2017); and Total hip arthroplasty (Right, 08/22/2017).  Allergies  Allergen Reactions  . Codeine Other (See Comments)    Feeling intoxicated    No current facility-administered medications on file prior to  encounter.    Current Outpatient Medications on File Prior to Encounter  Medication Sig  . acetaminophen (TYLENOL) 325 MG tablet Take 2 tablets (650 mg total) by mouth every 6 (six) hours as needed for mild pain (or Fever >/= 101).  Marland Kitchen amLODipine (NORVASC) 10 MG tablet TAKE 1 TABLET BY MOUTH EVERY DAY  . aspirin EC 325 MG EC tablet Take 1 tablet (325 mg total) by mouth daily with breakfast.  . atorvastatin (LIPITOR) 80 MG tablet TAKE 1 TABLET BY MOUTH EVERY DAY  . BYSTOLIC 5 MG tablet TAKE 1 TABLET BY MOUTH EVERY DAY  . Calcium Carbonate-Vitamin D (CALCIUM 600+D) 600-400 MG-UNIT per tablet Take 1 tablet by mouth daily.  . ferrous sulfate 325 (65 FE) MG tablet Take 1 tablet (325 mg total) by mouth 2 (two) times daily with a meal. (Patient taking differently: Take 325 mg daily with breakfast by mouth. )  . furosemide (LASIX) 20 MG tablet Take 20 mg daily by mouth.   Marland Kitchen glycopyrrolate (ROBINUL) 2 MG tablet Take 2 mg by mouth 2 (two) times daily.  Marland Kitchen lisinopril-hydrochlorothiazide (PRINZIDE,ZESTORETIC) 20-25 MG tablet TAKE 1/2 TABLET BY MOUTH TWICE A DAY  . LORazepam (ATIVAN) 0.5 MG tablet Take 0.5 mg by mouth at bedtime as needed for anxiety or sleep.   . Multiple Vitamin (MULTIVITAMIN) tablet Take 1 tablet by mouth daily.  Marland Kitchen omeprazole (PRILOSEC) 40 MG capsule Take 1 capsule (40 mg total) by mouth 2 (two) times daily.  Marland Kitchen oxyCODONE-acetaminophen (ROXICET) 5-325 MG tablet Take 1-2 tablets by mouth every 4 (four) hours as needed.  . potassium chloride (MICRO-K) 10 MEQ CR capsule TAKE 2 CAPSULES BY MOUTH TWICE A DAY  . sertraline (ZOLOFT) 100 MG tablet TAKE 2 TABLETS (200MG ) BY MOUTH ONCE A DAY  .  tiotropium (SPIRIVA HANDIHALER) 18 MCG inhalation capsule Place 1 capsule (18 mcg total) into inhaler and inhale every morning.   FAMILY HISTORY:  Her indicated that her mother is deceased. She indicated that her father is deceased. She indicated that the status of her sister is unknown. She indicated that  her maternal grandmother is deceased. She indicated that her maternal grandfather is deceased. She indicated that her paternal grandmother is deceased. She indicated that her paternal grandfather is deceased. She indicated that the status of her daughter is unknown. She indicated that the status of her neg hx is unknown.  SOCIAL HISTORY: She  reports that she has been smoking cigarettes.  She has a 52.00 pack-year smoking history. she has never used smokeless tobacco. She reports that she does not drink alcohol or use drugs.  REVIEW OF SYSTEMS:   Review of Systems  Unable to perform ROS: Mental acuity   SUBJECTIVE:  Patient confused, moaning and rolling around in bed, c/o abdominal pain  VITAL SIGNS: BP (!) 70/33   Pulse 67   Temp 97.7 F (36.5 C) (Oral)   Resp (!) 26   LMP  (LMP Unknown)   SpO2 99%   HEMODYNAMICS:  not on vasopressors  INTAKE / OUTPUT: No intake/output data recorded.  PHYSICAL EXAMINATION: General: Elderly female, chronically ill appearing, lying in ER bed Neuro: moving all extremities, obeying commands, not oriented  HEENT: OP clear, MM moist Cardiovascular: RRR no m/r/g Lungs: CTA b/l Abdomen: Soft, TTP in periumbilical area, no g/r, BS absent Musculoskeletal: no LE edema  Skin: no rashes   LABS:  BMET Recent Labs  Lab 09/29/17 2244  NA 137  K 4.1  CL 108  CO2 16*  BUN 27*  CREATININE 1.37*  GLUCOSE 132*    Electrolytes Recent Labs  Lab 09/29/17 2244  CALCIUM 8.4*   CBC Recent Labs  Lab 09/29/17 2244  WBC 6.5  HGB 4.4*  HCT 15.3*  PLT 207   Coag's No results for input(s): APTT, INR in the last 168 hours.  Sepsis Markers Recent Labs  Lab 09/29/17 2306  LATICACIDVEN 5.39*   ABG No results for input(s): PHART, PCO2ART, PO2ART in the last 168 hours.  Liver Enzymes Recent Labs  Lab 09/29/17 2244  AST 67*  ALT 46  ALKPHOS 72  BILITOT 0.4  ALBUMIN 2.9*   Cardiac Enzymes No results for input(s): TROPONINI, PROBNP in  the last 168 hours.  Glucose Recent Labs  Lab 09/29/17 2347  GLUCAP 143*   Imaging No results found.  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 In the ER patient was found to be hypotensive 75/43 and anemic (Hgb 4.4 down from baseline of 8.1). In the ER she was given IV PPI, 3L IVF, and started on 1st unit of pRBC. PCCM was then consulted.   GASTROINTESTINAL 1. GIB; Hx AVM's and Cecal ulceration; Hx GERD: - has hx of GIB due to AVM's and another episode due to cecal ulceration - now with dark tarry stools x 2 weeks, Hgb 4.4 down from 8.1 - FOBT positive on exam - 2u pRBC ordered;  will likely need more but at risk for developing pulmonary edema during rescucitation; will monitor closely.  - GI consulted - continue IV PPI - check INR  PULMONARY No active issues   CARDIOVASCULAR 1. Hx CAD s/p CABG and AVR; Hx HTN; Hx g1 dCHF - is on ASA as outpatient; holding now - hold antihypertensives in setting of shock  2. Hemorrhagic shock: due to GIB - if doesn't improve with transfusion may need CVC  RENAL 1. AKI - creatinine 1.37 up from baseline of 0.5; likely related to prerenal vs ATN from the GIB/Shock - monitor UOP closely; avoid nephrotoxic agents - if doesn't improve with transfusion, will pursue further workup./  HEMATOLOGIC 1. Hx Anemia: see plan above  INFECTIOUS 1. Lactic acidosis: due to tissue hypoperfusion in setting of shock; continue to monitor. No signs/symptoms of infection.  ENDOCRINE No active issues   NEUROLOGIC 1. Acute encephalopathy: - likely related to the hypotension from  GIB; afocal neuro exam.  - continue to monitor  FAMILY  - Updates: patient's daughter (1 of 2 daughters) and patient's sister at bedside. Daughter reports there is no POA. Both daughters are next of kin. Daughter reports patient is FULL CODE - Inter-disciplinary family meet or Palliative Care meeting due by: 10/07/17  60 minutes critical care time  Vernie Murders, MD  Pulmonary and [place] Pager: [phone]  09/30/2017, 1:16 AM

## 2017-09-30 NOTE — H&P (View-Only) (Signed)
[facility] Consult: 8:17 AM 09/30/2017  LOS: 0 days    Referring Provider: Dr Brand Males  Primary Care Physician:  Donald Prose, MD Primary Gastroenterologist:  Dr. Ardis Hughs.  [facility] prior to 2016.    Daughter [name] [phone].    Reason for Consultation: Anemia, dark and FOBT + stools.     HPI: [name] is a 77 y.o. female.  PMH CAD s/p 2v CABG/biologic AVR 2012.  MI 2010.  HTN.   PVD.  Grade 1 diastolic dysfunction. GERD.  Iron Def/GI blood loss anemia for many years, originally worked up at [facility]  Periodically required transfusion, latest was 2 U in 03/2017.  Hx complicated diverticulosis, s/p 2005 rectosigmoid colectomy, low pelvic anastomosis, mobilization of splenic flexure. Hx diarrhea.  S/P elective right hip arthroplasty 08/22/17.   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   2009 Colonoscopy: cecal ulcer, biopsies showed ischemic changes.  2010 Capsule endoscopy: nonbleeding AVMs throughout the small bowel.    05/2015 EGD for dysphagia.  Non-specific mild gastritis.  Path: chronic inactive gastritis, no H Pylori.   05/2015 Colonoscopy for loose stools, anemia.  Poor prep, small lesions could be missed.  As seen, mucosa normal.  Random colon biopsies: unremarkable, neg for microscopic colitis.    03/2017 EGD for FOBT + anemia in setting of Mobic use: 2 diminutive, non-bleeding gastric ulcers without bleeding stigmata.  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  Path: reactive gastropathy, no H pylori biopsy   03/2017 Colonoscopy: APC ablation of non-bleeding cecal AVM.  Hypertrophic anal papillae.  Fair prep but could have missed flat lesions.  No active bleeding or blood seen.    Meds include BID iron. BID Omeprazole (??? as patient tells me she cannot tolerate this medication  but she is a poor historian).  Not on AC.  Mobic discontinued, low dose ASA continued after 03/2017.    For several weeks, perhaps even before her hip surgery, she has had increased frequency of stools.  Her baseline is a bowel movement daily versus every other day and mostly formed but black stools because she takes iron.  For several weeks stool frequency has been a bit looser and up to 2-3 BMs per day.  She also reports several weeks of intermittent postprandial (liquids as well as solids) lower abdominal pain.  + Early satiety.  She takes only Tylenol for pain at home.  About 2 weeks ago she fell in her bedroom between the walker and the edge of her bed, landing on her left hip.  She has had left hip pain since then.  No syncope.  No shortness of breath.  No chest pain.  No palpitations.  + "8#" weight loss.   PO intake has been close to nothing for the last 3-4 days.  She had an episode of nonbloody N/V.    She was brought to the ED last night where she was hypotensive at 75/43 but not tachycardic and anemic. Hgb 4.4 >> 2 U PRBCs >> 7.4.  Hgb on  08/25/17 was 8.1.  MCV 84   Hgb pre THR 10.2, post: 8.1.  + AKI: BUN/creat 27/1.3.   AST slightly up at 67, albumin low at 2.9 but o/w LFTs normal.   Latest iron studies 03/2017: low iron, low iron sat, elevated TIBC.  Ferritin 23,   Hypotension continues.  She has not undergone any radiologic imaging of either her hips or abdomen pelvis.  She has not been using NSAIDs.  Does not drink alcoholic beverages.   Past Medical History:  Diagnosis Date  . Abdominal pain, unspecified site   . Anemia    Iron deficiency anemia  . Anxiety   . Aortic stenosis   . Arthritis   . CAD (coronary artery disease)    psot bypass x2 to diagonal and obtuse marginal. SVG graft-01/08/2009  . Depression   . Dyspnea   . GERD (gastroesophageal reflux disease)   . History of cardiovascular stress test    Myoview 7/16:  Low risk, no ischemia or scar, EF 72%  . History of  echocardiogram    Echo 7/16:  EF 55-60%, no RWMA, Gr 2 DD, AVR ok (mean 20 mmHg), PASP 40 mmHg  . History of GI bleed    With AVM  . Hypercholesterolemia   . Hyperlipidemia   . Hypertension   . Iron deficiency anemia   . Myocardial infarction ([place]) 2010  . Neuromuscular disorder (HCC)    numbness in hands  . Peripheral vascular disease ([place])    righ tleg  . Shoulder impingement syndrome    Right shoulder    Past Surgical History:  Procedure Laterality Date  . ABDOMINAL HYSTERECTOMY    . AORTIC VALVE REPLACEMENT     [facility] #[id number]  . CARDIAC CATHETERIZATION N/A 08/11/2015   Procedure: Right Heart Cath;  Surgeon: Jolaine Artist, MD;  Location: [facility];  Service: Cardiovascular;  Laterality: N/A;  . CARDIAC SURGERY  2010   , 2 blockages  . CHOLECYSTECTOMY    . COLONOSCOPY N/A 04/12/2017   Procedure: COLONOSCOPY;  Surgeon: Manus Gunning, MD;  Location: [facility];  Service: Gastroenterology;  Laterality: N/A;  . CORONARY ARTERY BYPASS GRAFT    . ESOPHAGOGASTRODUODENOSCOPY N/A 04/12/2017   Procedure: ESOPHAGOGASTRODUODENOSCOPY (EGD);  Surgeon: Manus Gunning, MD;  Location: [facility];  Service: Gastroenterology;  Laterality: N/A;  . HEEL SPUR EXCISION  2000   left heel  . SPINE SURGERY  2003   Herniated diskectomy  . TOTAL HIP ARTHROPLASTY Right 08/22/2017   Procedure: RIGHT TOTAL HIP ARTHROPLASTY ANTERIOR APPROACH;  Surgeon: Mcarthur Rossetti, MD;  Location: [facility];  Service: Orthopedics;  Laterality: Right;  . WRIST SURGERY     right wrist    Prior to Admission medications   Medication Sig Start Date End Date Taking? Authorizing Provider  acetaminophen (TYLENOL) 325 MG tablet Take 2 tablets (650 mg total) by mouth every 6 (six) hours as needed for mild pain (or Fever >/= 101). 04/12/17  Yes Rosita Fire, MD  amLODipine (NORVASC) 10 MG tablet TAKE 1 TABLET BY MOUTH EVERY DAY 08/04/17  Yes Jerline Pain, MD  aspirin EC 81 MG  tablet Take 81 mg by mouth daily.   Yes [provider]  atorvastatin (LIPITOR) 80 MG tablet TAKE 1 TABLET BY MOUTH EVERY DAY 08/06/17  Yes Jerline Pain, MD  BYSTOLIC 5 MG tablet TAKE 1 TABLET BY MOUTH EVERY DAY 06/30/17  Yes Jerline Pain, MD  Calcium Carbonate-Vitamin D (CALCIUM 600+D) 600-400  MG-UNIT per tablet Take 1 tablet by mouth daily.   Yes [provider]  ferrous sulfate 325 (65 FE) MG tablet Take 1 tablet (325 mg total) by mouth 2 (two) times daily with a meal. Patient taking differently: Take 325 mg daily with breakfast by mouth.  04/12/17  Yes Rosita Fire, MD  furosemide (LASIX) 20 MG tablet Take 20 mg daily by mouth.    Yes [provider]  glycopyrrolate (ROBINUL) 2 MG tablet Take 2 mg by mouth 2 (two) times daily. 12/09/14  Yes [provider]  lisinopril-hydrochlorothiazide (PRINZIDE,ZESTORETIC) 20-25 MG tablet TAKE 1/2 TABLET BY MOUTH TWICE A DAY 08/18/17  Yes Jerline Pain, MD  LORazepam (ATIVAN) 0.5 MG tablet Take 0.5 mg by mouth at bedtime as needed for anxiety or sleep.    Yes [provider]  Multiple Vitamin (MULTIVITAMIN) tablet Take 1 tablet by mouth daily.   Yes [provider]  omeprazole (PRILOSEC) 40 MG capsule Take 1 capsule (40 mg total) by mouth 2 (two) times daily. 04/12/17  Yes Rosita Fire, MD  oxyCODONE-acetaminophen (ROXICET) 5-325 MG tablet Take 1-2 tablets by mouth every 4 (four) hours as needed. Patient taking differently: Take 1-2 tablets by mouth every 4 (four) hours as needed for moderate pain.  08/25/17  Yes Mcarthur Rossetti, MD  potassium chloride (MICRO-K) 10 MEQ CR capsule TAKE 2 CAPSULES BY MOUTH TWICE A DAY 09/15/17  Yes Jerline Pain, MD  sertraline (ZOLOFT) 100 MG tablet TAKE 2 TABLETS (200MG ) BY MOUTH ONCE A DAY 10/30/15  Yes [provider]  tiotropium (SPIRIVA HANDIHALER) 18 MCG inhalation capsule Place 1 capsule (18 mcg total) into inhaler and inhale every  morning. 04/09/17  Yes Donita Brooks, NP    Scheduled Meds: . [START ON 10/03/2017] pantoprazole  40 mg Intravenous Q12H  . tiotropium  18 mcg Inhalation Daily   Infusions: . sodium chloride    . sodium chloride    . pantoprozole (PROTONIX) infusion 8 mg/hr (09/30/17 0008)   PRN Meds: sodium chloride   Allergies as of 09/29/2017 - Review Complete 09/29/2017  Allergen Reaction Noted  . Codeine Other (See Comments) 10/14/2011    Family History  Problem Relation Age of Onset  . Hypertension Mother   . Kidney disease Mother   . Hypertension Father   . Hypertension Sister   . Hypertension Brother   . Hypertension Daughter   . Diabetes Brother   . Heart attack Neg Hx   . Stroke Neg Hx     Social History   Socioeconomic History  . Marital status: Single    Spouse name: Not on file  . Number of children: 3  . Years of education: Not on file  . Highest education level: Not on file  Social Needs  . Financial resource strain: Not on file  . Food insecurity - worry: Not on file  . Food insecurity - inability: Not on file  . Transportation needs - medical: Not on file  . Transportation needs - non-medical: Not on file  Occupational History  . Occupation: Retired  Tobacco Use  . Smoking status: Current Some Day Smoker    Packs/day: 1.00    Years: 52.00    Pack years: 52.00    Types: Cigarettes    Last attempt to quit: 01/21/2017    Years since quitting: 0.6  . Smokeless tobacco: Never Used  . Tobacco comment: "might borrow a cigarette from time to time"  Substance and Sexual  Activity  . Alcohol use: No    Alcohol/week: 0.0 oz  . Drug use: No  . Sexual activity: Not Currently  Other Topics Concern  . Not on file  Social History Narrative  . Not on file    REVIEW OF SYSTEMS: Constitutional: Weakness ENT:  No nose bleeds Pulm: Denies cough and denies shortness of breath.  A pack of cigarettes lasts her 2.5 days. CV:  No palpitations, no LE edema.  GU:  No  hematuria, no frequency GI:  Per HPI Heme:  No unusual or excessive bleeding/bruising. Transfusions:  Per HPI Neuro:  No headaches, no peripheral tingling or numbness Derm:  No itching, no rash or sores.  Endocrine:  No sweats or chills.  No polyuria or dysuria Immunization: Had her flu shot in 07/2017 Travel:  None beyond local counties in last few months.    PHYSICAL EXAM: Vital signs in last 24 hours: Vitals:   09/30/17 0618 09/30/17 0756  BP: 108/61   Pulse: (!) 59   Resp: (!) 24   Temp:  98.1 F (36.7 C)  SpO2: 99%    Wt Readings from Last 3 Encounters:  09/30/17 65 kg (143 lb 4.8 oz)  08/22/17 68 kg (150 lb)  08/11/17 68 kg (150 lb)    General: Uncomfortable, somewhat chronically ill-appearing AA F.  She is a poor historian. Head: No facial asymmetry, swelling or signs of head trauma. Eyes: No scleral icterus.  No conjunctival pallor. Ears: Not hard of hearing Nose: No discharge Mouth: Moist, clear, pink oral mucosa.  Tongue midline. Neck: No JVD.  No thyromegaly.  No masses. Lungs: Clear bilaterally.  No labored breathing or cough. Heart: RRR.  Valve snap.  S1, S2 present. Abdomen: Soft without distention..  Bowel sounds hypoactive but no tinkling or tympanitic bowel sounds.  Slight bilateral lower abdominal tenderness..   Rectal: Did not repeat rectal exam.  Stools dark/black and FOBT positive per nursing staff. Musc/Skeltl: Right hip surgical incision completely intact and healing well.  Do not see any signs of trauma or bruising on either hip. Extremities: No CCE. Neurologic: Alert.  Oriented x3.  Poor historian.  Moves all 4 limbs.  Limb strength not tested.  No tremors. Skin: No sores, rashes or suspicious lesions. Nodes: No cervical adenopathy. Psych: Cooperative, affect subdued.  Intake/Output from previous day: 01/07 0701 - 01/08 0700 In: 3990 [I.V.:575; Blood:315; IV Piggyback:3100] Out: 50 [Urine:50] Intake/Output this shift: No intake/output data  recorded.  LAB RESULTS: Recent Labs    09/29/17 2244 09/30/17 0602  WBC 6.5 8.7  HGB 4.4* 7.4*  HCT 15.3* 24.1*  PLT 207 189   BMET Lab Results  Component Value Date   NA 138 09/30/2017   NA 137 09/29/2017   NA 137 08/23/2017   K 4.2 09/30/2017   K 4.1 09/29/2017   K 3.1 (L) 08/23/2017   CL 112 (H) 09/30/2017   CL 108 09/29/2017   CL 104 08/23/2017   CO2 16 (L) 09/30/2017   CO2 16 (L) 09/29/2017   CO2 26 08/23/2017   GLUCOSE 89 09/30/2017   GLUCOSE 132 (H) 09/29/2017   GLUCOSE 140 (H) 08/23/2017   BUN 26 (H) 09/30/2017   BUN 27 (H) 09/29/2017   BUN 9 08/23/2017   CREATININE 1.10 (H) 09/30/2017   CREATININE 1.37 (H) 09/29/2017   CREATININE 0.57 08/23/2017   CALCIUM 7.9 (L) 09/30/2017   CALCIUM 8.4 (L) 09/29/2017   CALCIUM 8.7 (L) 08/23/2017   LFT Recent Labs  09/29/17 2244  PROT 6.1*  ALBUMIN 2.9*  AST 67*  ALT 46  ALKPHOS 72  BILITOT 0.4   PT/INR Lab Results  Component Value Date   INR 1.38 09/30/2017   INR 1.19 04/10/2017   INR 1.11 08/07/2015    Drugs of Abuse  No results found for: LABOPIA, COCAINSCRNUR, LABBENZ, AMPHETMU, THCU, LABBARB   RADIOLOGY STUDIES: No results found.    IMPRESSION:   *   Recurrent blood loss anemia and FOBT + stools.   Likely due to upper or lower GI tract AVMs.  However with her hypotension and complaint of postprandial lower abdominal pain, early satiety wonder if she could have ischemic injury to the colon.  Recall that back in 2009 she had an ischemic cecal ulcer.    *  S/p PRBC x 2 U with excellent response.    *  2005 rectosigmoidcolectomy for complicated diverticular disease.  *  Slight elevation of AST.  S/p remote cholecystectomy.  Patient does not drink.  *  Right THR 6 weeks ago.  Fell onto her left hip about 2 weeks ago with subsequent, ongoing pain in the left hip.    *   AKI, BUN and creatinine equally compromised.   Certainly dehydrated given little p.o. intake for the last 3 or 4 days.       PLAN:     *  Entersocopy tomorrow 11 AM, ok to have clear liquids today.    *  Hgb/ hct at 5 PM today, CBC in AM.    *  Stop IV Protonix finish current bag.  Starting BID IV tonight is sufficient.    Azucena Freed  09/30/2017, 8:17 AM Pager: [phone]  GI ATTENDING  History, laboratories, prior endoscopy reports reviewed. Patient personally seen and examined. Several family members at bedside. Agree with comprehensive consultation note as outlined above. The patient presents with profound symptomatic anemia. Reports of dark stools. Hemoccult positive. 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 Suspect the same superimposed on blood loss from orthopedic surgery. Stable after transfusion. Plan upper endoscopy with deep enteroscopy tomorrow in the endoscopy unit with monitored anesthesia care. The patient is high risk given her age comorbidities.The nature of the procedure, as well as the risks, benefits, and alternatives were carefully and thoroughly reviewed with the patient. Ample time for discussion and questions allowed. The patient understood, was satisfied, and agreed to proceed. She will need chronic iron supplementation and close outpatient monitoring of her blood counts post discharge.  Docia Chuck. Geri Seminole., M.D. [facility] Division of Gastroenterology

## 2017-09-30 NOTE — ED Notes (Signed)
#  [phone], [name] - daughter, would like to be notified with any bed assignment or updates.

## 2017-09-30 NOTE — Progress Notes (Signed)
Troponin value reported to Dr. Elsworth Soho.

## 2017-09-30 NOTE — Progress Notes (Signed)
Pt. Refused cpap. Pt. States she will notify if she changes her mind

## 2017-09-30 NOTE — ED Notes (Signed)
Admitting MD at bedside.

## 2017-09-30 NOTE — Progress Notes (Addendum)
PULMONARY / CRITICAL CARE MEDICINE   Name: [name] MRN: [id number] DOB: 09/08/1941    ADMISSION DATE:  09/29/2017 CONSULTATION DATE:  09/30/17  REFERRING MD: Dr Roxanne Mins (ER)  CHIEF COMPLAINT: GIB  HISTORY OF PRESENT ILLNESS: 76yoF with hx CAD s/p 2v CABG (01/2011) and AVR (01/2011), GERD, HTN, Anemia, PVD, Grade 1 diastolic dysfunction, and GIB (2010, 2012 due to duodenal AVM's, another instance due to cecal ulceration), now s/p Right hip arthroplasty (08/22/17), now presents to the ER with AMS, Dark tarry stools x 2 wks, no PO intake x 3-4 days, 1 episode of N/V, and periumbilical abdominal pain. In the ER patient was found to be hypotensive 75/43 and anemic (Hgb 4.4 down from baseline of 8.1). In the ER she was given IV PPI, 3L IVF, and started on 1st unit of pRBC. PCCM was then consulted.    SUBJECTIVE:  Patient reports aching abdominal pain and decreased appetite. She states her dark tarry stools have been present since before her hip surgery x6 weeks ago. She denies vomiting.She has some shortness of breath at baseline and reports some lower extremity swelling but none was found on exam.   VITAL SIGNS: BP (!) 96/56   Pulse 64   Temp 98.1 F (36.7 C) (Oral)   Resp (!) 21   Ht 4\' 9"  (1.448 m)   Wt 143 lb 4.8 oz (65 kg)   LMP  (LMP Unknown)   SpO2 100%   BMI 31.01 kg/m   HEMODYNAMICS:  Not on vasopressors  INTAKE / OUTPUT: I/O last 3 completed shifts: In: 3990 [I.V.:575; Blood:315; IV Piggyback:3100] Out: 3 [Urine:50]  PHYSICAL EXAMINATION: General: Elderly female laying in bed in no acute distress HEENT: MM pink/moist PSY: Calm, appropriate Neuro: A&O x4 CV: s1s2 rrr, no m/r/g PULM: even/non-labored, lungs bilaterally clear anteriorly, mild crackles heard laterally on both sides TO:IZTI, obese  Extremities: warm/dry, no edema  Skin: no rashes or lesions   LABS:  BMET Recent Labs  Lab 09/29/17 2244 09/30/17 0602  NA 137 138  K 4.1 4.2  CL 108 112*  CO2  16* 16*  BUN 27* 26*  CREATININE 1.37* 1.10*  GLUCOSE 132* 89    Electrolytes Recent Labs  Lab 09/29/17 2244 09/30/17 0602  CALCIUM 8.4* 7.9*  MG  --  1.8  PHOS  --  4.6   CBC Recent Labs  Lab 09/29/17 2244 09/30/17 0602  WBC 6.5 8.7  HGB 4.4* 7.4*  HCT 15.3* 24.1*  PLT 207 189   Coag's Recent Labs  Lab 09/30/17 0135  APTT 28  INR 1.38    Sepsis Markers Recent Labs  Lab 09/29/17 2306 09/30/17 0135 09/30/17 0602  LATICACIDVEN 5.39* 5.9* 2.2*   ABG No results for input(s): PHART, PCO2ART, PO2ART in the last 168 hours.  Liver Enzymes Recent Labs  Lab 09/29/17 2244  AST 67*  ALT 46  ALKPHOS 72  BILITOT 0.4  ALBUMIN 2.9*   Cardiac Enzymes No results for input(s): TROPONINI, PROBNP in the last 168 hours.  Glucose Recent Labs  Lab 09/29/17 2347 09/30/17 0409  GLUCAP 143* 129*   Imaging No results found.  LINES/TUBES: 20G and 18G   ASSESSMENT / PLAN: 76yoF with hx CAD s/p 2v CABG (01/2011) and AVR (01/2011), GERD, HTN, Anemia, PVD, Grade 1 diastolic dysfunction, and GIB (2010, 2012 due to duodenal AVM's, another instance due to cecal ulceration), now s/p Right hip arthroplasty (08/22/17), now presents to the ER with AMS, Dark tarry stools present prior to  hip surgery, no PO intake x 3-4 days, 1 episode of N/V, and periumbilical abdominal pain. In the ER patient was found to be hypotensive 75/43 and anemic (Hgb 4.4 down from baseline of 8.1). Treated with PPI, IVF, 2 units PRBC with improvement in hemodynamics.   GASTROINTESTINAL A: GIB; Hx AVM's and Cecal ulceration Hx GERD P: GI consulted Anticipate possible EGD or bleeding study Continue IV PPI Monitor stools  PULMONARY A: Moderate OSA, AHI 20.4, SaO2 low 80% sleep study from home 11/18 Tobacco abuse Moderate restrctiive lung disease P: Add auto-titration CPAP qhs and prn day time sleep Incentive spirometry and pulmonary hygiene   CARDIOVASCULAR A:  Hx CAD s/p CABG and AVR; Hx  HTN; Hx g1 dCHF P: ICU monitoring until seen by GI Hold ASA and home antihypertensives Troponin x1  Hematology A:  Hemorrhagic shock: due to GIB Anemia P: Trend CBC q4, last q4 scheduled 5pm 1/8 Transfuse per ICU guidelines Await 9am CBC and troponin to determine transfusion  RENAL A: AKI secondary to ABLA/GI bleed  Lactic acidosis - resolved P: Trend BMP, ensure adequate perfusion, avoid nephrotoxic agents Replace electrolytes as appropriate  INFECTIOUS A: P:  ENDOCRINE No active issues   NEUROLOGIC A: Acute encephalopathy: resolved Pain - recent hip surgery, abd cramping   P: Monitor and supportive care PRN tylenol for pain   FAMILY  - Updates:  No family available at bedside on 1/8. Family updated per RN and Dr. Jimmey Ralph early 1/8  patient's daughter (1 of 2 daughters) and patient's sister at bedside. Daughter reports there is no POA. Both daughters are next of kin. Daughter reports patient is FULL CODE  Kathleene Hazel PA-S   Noe Gens, NP-C [facility] Pgr: [phone] or if no answer [phone] 09/30/2017, 91:78 AM   77 year old woman with CAD status post right hip arthroplasty 11/30, admitted 1/7 with dark tarry stools and hemorrhagic shock and hemoglobin of 4.4 down from baseline of 8.1.  She had been on aspirin after hip surgery. She has a history of GI bleed in the past due to AVMs and last endoscopy 03/2017 had shown gastric ulcers. She has been resuscitated with blood and IV fluids to hemoglobin of 7.4, there is mild troponin leak.  Otherwise feels well and exam shows soft and nontender abdomen. She can transfer to floor on protonix gtt &  with  frequent hemoglobin checks and to triad 1/9. Repeat endoscopy/enteroscopy is planned 1/9  Tierney Behl V. Elsworth Soho MD

## 2017-09-30 NOTE — Consult Note (Signed)
[facility] Consult: 8:17 AM 09/30/2017  LOS: 0 days    Referring Provider: Dr Brand Males  Primary Care Physician:  Donald Prose, MD Primary Gastroenterologist:  Dr. Ardis Hughs.  [facility] prior to 2016.    Daughter Celedonio Savage [phone].    Reason for Consultation: Anemia, dark and FOBT + stools.     HPI: [name] is a 77 y.o. female.  PMH CAD s/p 2v CABG/biologic AVR 2012.  MI 2010.  HTN.   PVD.  Grade 1 diastolic dysfunction. GERD.  Iron Def/GI blood loss anemia for many years, originally worked up at [facility]  Periodically required transfusion, latest was 2 U in 03/2017.  Hx complicated diverticulosis, s/p 2005 rectosigmoid colectomy, low pelvic anastomosis, mobilization of splenic flexure. Hx diarrhea.  S/P elective right hip arthroplasty 08/22/17.   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   2009 Colonoscopy: cecal ulcer, biopsies showed ischemic changes.  2010 Capsule endoscopy: nonbleeding AVMs throughout the small bowel.    05/2015 EGD for dysphagia.  Non-specific mild gastritis.  Path: chronic inactive gastritis, no H Pylori.   05/2015 Colonoscopy for loose stools, anemia.  Poor prep, small lesions could be missed.  As seen, mucosa normal.  Random colon biopsies: unremarkable, neg for microscopic colitis.    03/2017 EGD for FOBT + anemia in setting of Mobic use: 2 diminutive, non-bleeding gastric ulcers without bleeding stigmata.  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  Path: reactive gastropathy, no H pylori biopsy   03/2017 Colonoscopy: APC ablation of non-bleeding cecal AVM.  Hypertrophic anal papillae.  Fair prep but could have missed flat lesions.  No active bleeding or blood seen.    Meds include BID iron. BID Omeprazole (??? as patient tells me she cannot tolerate this medication  but she is a poor historian).  Not on AC.  Mobic discontinued, low dose ASA continued after 03/2017.    For several weeks, perhaps even before her hip surgery, she has had increased frequency of stools.  Her baseline is a bowel movement daily versus every other day and mostly formed but black stools because she takes iron.  For several weeks stool frequency has been a bit looser and up to 2-3 BMs per day.  She also reports several weeks of intermittent postprandial (liquids as well as solids) lower abdominal pain.  + Early satiety.  She takes only Tylenol for pain at home.  About 2 weeks ago she fell in her bedroom between the walker and the edge of her bed, landing on her left hip.  She has had left hip pain since then.  No syncope.  No shortness of breath.  No chest pain.  No palpitations.  + "8#" weight loss.   PO intake has been close to nothing for the last 3-4 days.  She had an episode of nonbloody N/V.    She was brought to the ED last night where she was hypotensive at 75/43 but not tachycardic and anemic. Hgb 4.4 >> 2 U PRBCs >> 7.4.  Hgb on  08/25/17 was 8.1.  MCV 84   Hgb pre THR 10.2, post: 8.1.  + AKI: BUN/creat 27/1.3.   AST slightly up at 67, albumin low at 2.9 but o/w LFTs normal.   Latest iron studies 03/2017: low iron, low iron sat, elevated TIBC.  Ferritin 23,   Hypotension continues.  She has not undergone any radiologic imaging of either her hips or abdomen pelvis.  She has not been using NSAIDs.  Does not drink alcoholic beverages.   Past Medical History:  Diagnosis Date  . Abdominal pain, unspecified site   . Anemia    Iron deficiency anemia  . Anxiety   . Aortic stenosis   . Arthritis   . CAD (coronary artery disease)    psot bypass x2 to diagonal and obtuse marginal. SVG graft-01/08/2009  . Depression   . Dyspnea   . GERD (gastroesophageal reflux disease)   . History of cardiovascular stress test    Myoview 7/16:  Low risk, no ischemia or scar, EF 72%  . History of  echocardiogram    Echo 7/16:  EF 55-60%, no RWMA, Gr 2 DD, AVR ok (mean 20 mmHg), PASP 40 mmHg  . History of GI bleed    With AVM  . Hypercholesterolemia   . Hyperlipidemia   . Hypertension   . Iron deficiency anemia   . Myocardial infarction ([place]) 2010  . Neuromuscular disorder (HCC)    numbness in hands  . Peripheral vascular disease ([place])    righ tleg  . Shoulder impingement syndrome    Right shoulder    Past Surgical History:  Procedure Laterality Date  . ABDOMINAL HYSTERECTOMY    . AORTIC VALVE REPLACEMENT     [place] #[id number]  . CARDIAC CATHETERIZATION N/A 08/11/2015   Procedure: Right Heart Cath;  Surgeon: Jolaine Artist, MD;  Location: [facility];  Service: Cardiovascular;  Laterality: N/A;  . CARDIAC SURGERY  2010   , 2 blockages  . CHOLECYSTECTOMY    . COLONOSCOPY N/A 04/12/2017   Procedure: COLONOSCOPY;  Surgeon: Manus Gunning, MD;  Location: [facility] ENDOSCOPY;  Service: Gastroenterology;  Laterality: N/A;  . CORONARY ARTERY BYPASS GRAFT    . ESOPHAGOGASTRODUODENOSCOPY N/A 04/12/2017   Procedure: ESOPHAGOGASTRODUODENOSCOPY (EGD);  Surgeon: Manus Gunning, MD;  Location: [facility];  Service: Gastroenterology;  Laterality: N/A;  . HEEL SPUR EXCISION  2000   left heel  . SPINE SURGERY  2003   Herniated diskectomy  . TOTAL HIP ARTHROPLASTY Right 08/22/2017   Procedure: RIGHT TOTAL HIP ARTHROPLASTY ANTERIOR APPROACH;  Surgeon: Mcarthur Rossetti, MD;  Location: [facility];  Service: Orthopedics;  Laterality: Right;  . WRIST SURGERY     right wrist    Prior to Admission medications   Medication Sig Start Date End Date Taking? Authorizing Provider  acetaminophen (TYLENOL) 325 MG tablet Take 2 tablets (650 mg total) by mouth every 6 (six) hours as needed for mild pain (or Fever >/= 101). 04/12/17  Yes Rosita Fire, MD  amLODipine (NORVASC) 10 MG tablet TAKE 1 TABLET BY MOUTH EVERY DAY 08/04/17  Yes Jerline Pain, MD  aspirin EC 81 MG  tablet Take 81 mg by mouth daily.   Yes [provider]  atorvastatin (LIPITOR) 80 MG tablet TAKE 1 TABLET BY MOUTH EVERY DAY 08/06/17  Yes Jerline Pain, MD  BYSTOLIC 5 MG tablet TAKE 1 TABLET BY MOUTH EVERY DAY 06/30/17  Yes Jerline Pain, MD  Calcium Carbonate-Vitamin D (CALCIUM 600+D) 600-400  MG-UNIT per tablet Take 1 tablet by mouth daily.   Yes [provider]  ferrous sulfate 325 (65 FE) MG tablet Take 1 tablet (325 mg total) by mouth 2 (two) times daily with a meal. Patient taking differently: Take 325 mg daily with breakfast by mouth.  04/12/17  Yes Rosita Fire, MD  furosemide (LASIX) 20 MG tablet Take 20 mg daily by mouth.    Yes [provider]  glycopyrrolate (ROBINUL) 2 MG tablet Take 2 mg by mouth 2 (two) times daily. 12/09/14  Yes [provider]  lisinopril-hydrochlorothiazide (PRINZIDE,ZESTORETIC) 20-25 MG tablet TAKE 1/2 TABLET BY MOUTH TWICE A DAY 08/18/17  Yes Jerline Pain, MD  LORazepam (ATIVAN) 0.5 MG tablet Take 0.5 mg by mouth at bedtime as needed for anxiety or sleep.    Yes [provider]  Multiple Vitamin (MULTIVITAMIN) tablet Take 1 tablet by mouth daily.   Yes [provider]  omeprazole (PRILOSEC) 40 MG capsule Take 1 capsule (40 mg total) by mouth 2 (two) times daily. 04/12/17  Yes Rosita Fire, MD  oxyCODONE-acetaminophen (ROXICET) 5-325 MG tablet Take 1-2 tablets by mouth every 4 (four) hours as needed. Patient taking differently: Take 1-2 tablets by mouth every 4 (four) hours as needed for moderate pain.  08/25/17  Yes Mcarthur Rossetti, MD  potassium chloride (MICRO-K) 10 MEQ CR capsule TAKE 2 CAPSULES BY MOUTH TWICE A DAY 09/15/17  Yes Jerline Pain, MD  sertraline (ZOLOFT) 100 MG tablet TAKE 2 TABLETS (200MG ) BY MOUTH ONCE A DAY 10/30/15  Yes [provider]  tiotropium (SPIRIVA HANDIHALER) 18 MCG inhalation capsule Place 1 capsule (18 mcg total) into inhaler and inhale every  morning. 04/09/17  Yes Donita Brooks, NP    Scheduled Meds: . [START ON 10/03/2017] pantoprazole  40 mg Intravenous Q12H  . tiotropium  18 mcg Inhalation Daily   Infusions: . sodium chloride    . sodium chloride    . pantoprozole (PROTONIX) infusion 8 mg/hr (09/30/17 0008)   PRN Meds: sodium chloride   Allergies as of 09/29/2017 - Review Complete 09/29/2017  Allergen Reaction Noted  . Codeine Other (See Comments) 10/14/2011    Family History  Problem Relation Age of Onset  . Hypertension Mother   . Kidney disease Mother   . Hypertension Father   . Hypertension Sister   . Hypertension Brother   . Hypertension Daughter   . Diabetes Brother   . Heart attack Neg Hx   . Stroke Neg Hx     Social History   Socioeconomic History  . Marital status: Single    Spouse name: Not on file  . Number of children: 3  . Years of education: Not on file  . Highest education level: Not on file  Social Needs  . Financial resource strain: Not on file  . Food insecurity - worry: Not on file  . Food insecurity - inability: Not on file  . Transportation needs - medical: Not on file  . Transportation needs - non-medical: Not on file  Occupational History  . Occupation: Retired  Tobacco Use  . Smoking status: Current Some Day Smoker    Packs/day: 1.00    Years: 52.00    Pack years: 52.00    Types: Cigarettes    Last attempt to quit: 01/21/2017    Years since quitting: 0.6  . Smokeless tobacco: Never Used  . Tobacco comment: "might borrow a cigarette from time to time"  Substance and Sexual  Activity  . Alcohol use: No    Alcohol/week: 0.0 oz  . Drug use: No  . Sexual activity: Not Currently  Other Topics Concern  . Not on file  Social History Narrative  . Not on file    REVIEW OF SYSTEMS: Constitutional: Weakness ENT:  No nose bleeds Pulm: Denies cough and denies shortness of breath.  A pack of cigarettes lasts her 2.5 days. CV:  No palpitations, no LE edema.  GU:  No  hematuria, no frequency GI:  Per HPI Heme:  No unusual or excessive bleeding/bruising. Transfusions:  Per HPI Neuro:  No headaches, no peripheral tingling or numbness Derm:  No itching, no rash or sores.  Endocrine:  No sweats or chills.  No polyuria or dysuria Immunization: Had her flu shot in 07/2017 Travel:  None beyond local counties in last few months.    PHYSICAL EXAM: Vital signs in last 24 hours: Vitals:   09/30/17 0618 09/30/17 0756  BP: 108/61   Pulse: (!) 59   Resp: (!) 24   Temp:  98.1 F (36.7 C)  SpO2: 99%    Wt Readings from Last 3 Encounters:  09/30/17 65 kg (143 lb 4.8 oz)  08/22/17 68 kg (150 lb)  08/11/17 68 kg (150 lb)    General: Uncomfortable, somewhat chronically ill-appearing AA F.  She is a poor historian. Head: No facial asymmetry, swelling or signs of head trauma. Eyes: No scleral icterus.  No conjunctival pallor. Ears: Not hard of hearing Nose: No discharge Mouth: Moist, clear, pink oral mucosa.  Tongue midline. Neck: No JVD.  No thyromegaly.  No masses. Lungs: Clear bilaterally.  No labored breathing or cough. Heart: RRR.  Valve snap.  S1, S2 present. Abdomen: Soft without distention..  Bowel sounds hypoactive but no tinkling or tympanitic bowel sounds.  Slight bilateral lower abdominal tenderness..   Rectal: Did not repeat rectal exam.  Stools dark/black and FOBT positive per nursing staff. Musc/Skeltl: Right hip surgical incision completely intact and healing well.  Do not see any signs of trauma or bruising on either hip. Extremities: No CCE. Neurologic: Alert.  Oriented x3.  Poor historian.  Moves all 4 limbs.  Limb strength not tested.  No tremors. Skin: No sores, rashes or suspicious lesions. Nodes: No cervical adenopathy. Psych: Cooperative, affect subdued.  Intake/Output from previous day: 01/07 0701 - 01/08 0700 In: 3990 [I.V.:575; Blood:315; IV Piggyback:3100] Out: 50 [Urine:50] Intake/Output this shift: No intake/output data  recorded.  LAB RESULTS: Recent Labs    09/29/17 2244 09/30/17 0602  WBC 6.5 8.7  HGB 4.4* 7.4*  HCT 15.3* 24.1*  PLT 207 189   BMET Lab Results  Component Value Date   NA 138 09/30/2017   NA 137 09/29/2017   NA 137 08/23/2017   K 4.2 09/30/2017   K 4.1 09/29/2017   K 3.1 (L) 08/23/2017   CL 112 (H) 09/30/2017   CL 108 09/29/2017   CL 104 08/23/2017   CO2 16 (L) 09/30/2017   CO2 16 (L) 09/29/2017   CO2 26 08/23/2017   GLUCOSE 89 09/30/2017   GLUCOSE 132 (H) 09/29/2017   GLUCOSE 140 (H) 08/23/2017   BUN 26 (H) 09/30/2017   BUN 27 (H) 09/29/2017   BUN 9 08/23/2017   CREATININE 1.10 (H) 09/30/2017   CREATININE 1.37 (H) 09/29/2017   CREATININE 0.57 08/23/2017   CALCIUM 7.9 (L) 09/30/2017   CALCIUM 8.4 (L) 09/29/2017   CALCIUM 8.7 (L) 08/23/2017   LFT Recent Labs  09/29/17 2244  PROT 6.1*  ALBUMIN 2.9*  AST 67*  ALT 46  ALKPHOS 72  BILITOT 0.4   PT/INR Lab Results  Component Value Date   INR 1.38 09/30/2017   INR 1.19 04/10/2017   INR 1.11 08/07/2015    Drugs of Abuse  No results found for: LABOPIA, COCAINSCRNUR, LABBENZ, AMPHETMU, THCU, LABBARB   RADIOLOGY STUDIES: No results found.    IMPRESSION:   *   Recurrent blood loss anemia and FOBT + stools.   Likely due to upper or lower GI tract AVMs.  However with her hypotension and complaint of postprandial lower abdominal pain, early satiety wonder if she could have ischemic injury to the colon.  Recall that back in 2009 she had an ischemic cecal ulcer.    *  S/p PRBC x 2 U with excellent response.    *  2005 rectosigmoidcolectomy for complicated diverticular disease.  *  Slight elevation of AST.  S/p remote cholecystectomy.  Patient does not drink.  *  Right THR 6 weeks ago.  Fell onto her left hip about 2 weeks ago with subsequent, ongoing pain in the left hip.    *   AKI, BUN and creatinine equally compromised.   Certainly dehydrated given little p.o. intake for the last 3 or 4 days.       PLAN:     *  Entersocopy tomorrow 11 AM, ok to have clear liquids today.    *  Hgb/ hct at 5 PM today, CBC in AM.    *  Stop IV Protonix finish current bag.  Starting BID IV tonight is sufficient.    Azucena Freed  09/30/2017, 8:17 AM Pager: [phone]  GI ATTENDING  History, laboratories, prior endoscopy reports reviewed. Patient personally seen and examined. Several family members at bedside. Agree with comprehensive consultation note as outlined above. The patient presents with profound symptomatic anemia. Reports of dark stools. Hemoccult positive. 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 Suspect the same superimposed on blood loss from orthopedic surgery. Stable after transfusion. Plan upper endoscopy with deep enteroscopy tomorrow in the endoscopy unit with monitored anesthesia care. The patient is high risk given her age comorbidities.The nature of the procedure, as well as the risks, benefits, and alternatives were carefully and thoroughly reviewed with the patient. Ample time for discussion and questions allowed. The patient understood, was satisfied, and agreed to proceed. She will need chronic iron supplementation and close outpatient monitoring of her blood counts post discharge.  Docia Chuck. Geri Seminole., M.D. [facility] Division of Gastroenterology

## 2017-10-01 ENCOUNTER — Inpatient Hospital Stay (HOSPITAL_COMMUNITY): Payer: Medicare Other | Admitting: Anesthesiology

## 2017-10-01 ENCOUNTER — Other Ambulatory Visit: Payer: Self-pay

## 2017-10-01 ENCOUNTER — Encounter (HOSPITAL_COMMUNITY): Payer: Self-pay

## 2017-10-01 ENCOUNTER — Encounter (HOSPITAL_COMMUNITY)
Admission: EM | Disposition: A | Discharge status: Home / Self Care | Payer: Self-pay | Source: Home / Self Care | Attending: Nephrology

## 2017-10-01 DIAGNOSIS — K922 Gastrointestinal hemorrhage, unspecified: Secondary | ICD-10-CM

## 2017-10-01 DIAGNOSIS — E861 Hypovolemia: Secondary | ICD-10-CM

## 2017-10-01 DIAGNOSIS — K5521 Angiodysplasia of colon with hemorrhage: Secondary | ICD-10-CM

## 2017-10-01 DIAGNOSIS — I9589 Other hypotension: Secondary | ICD-10-CM

## 2017-10-01 DIAGNOSIS — K5520 Angiodysplasia of colon without hemorrhage: Secondary | ICD-10-CM

## 2017-10-01 DIAGNOSIS — K31819 Angiodysplasia of stomach and duodenum without bleeding: Secondary | ICD-10-CM

## 2017-10-01 HISTORY — PX: ENTEROSCOPY: SHX5533

## 2017-10-01 LAB — BASIC METABOLIC PANEL
Anion gap: 9 (ref 5–15)
BUN: 13 mg/dL (ref 6–20)
CHLORIDE: 111 mmol/L (ref 101–111)
CO2: 19 mmol/L — AB (ref 22–32)
Calcium: 8.5 mg/dL — ABNORMAL LOW (ref 8.9–10.3)
Creatinine, Ser: 0.69 mg/dL (ref 0.44–1.00)
GFR calc non Af Amer: >60 mL/min (ref >60)
Glucose, Bld: 74 mg/dL (ref 65–99)
POTASSIUM: 3.1 mmol/L — AB (ref 3.5–5.1)
SODIUM: 139 mmol/L (ref 135–145)

## 2017-10-01 LAB — IRON AND TIBC
IRON: 212 ug/dL — AB (ref 28–170)
SATURATION RATIOS: 66 % — AB (ref 10.4–31.8)
TIBC: 322 ug/dL (ref 250–450)
UIBC: 110 ug/dL

## 2017-10-01 LAB — CBC
HEMATOCRIT: 23.2 % — AB (ref 36.0–46.0)
HEMOGLOBIN: 7.4 g/dL — AB (ref 12.0–15.0)
MCH: 27.5 pg (ref 26.0–34.0)
MCHC: 31.9 g/dL (ref 30.0–36.0)
MCV: 86.2 fL (ref 78.0–100.0)
Platelets: 176 10*3/uL (ref 150–400)
RBC: 2.69 MIL/uL — AB (ref 3.87–5.11)
RDW: 16.9 % — ABNORMAL HIGH (ref 11.5–15.5)
WBC: 7.5 10*3/uL (ref 4.0–10.5)

## 2017-10-01 LAB — FERRITIN: FERRITIN: 940 ng/mL — AB (ref 11–307)

## 2017-10-01 SURGERY — ENTEROSCOPY
Anesthesia: Monitor Anesthesia Care

## 2017-10-01 MED ORDER — BENZONATATE 100 MG PO CAPS
100.0000 mg | ORAL_CAPSULE | Freq: Three times a day (TID) | ORAL | Status: DC | PRN
  Administered 2017-10-01 – 2017-10-02 (×2): 100 mg via ORAL
  Filled 2017-10-01 (×2): qty 1

## 2017-10-01 MED ORDER — PROPOFOL 500 MG/50ML IV EMUL
INTRAVENOUS | Status: DC | PRN
  Administered 2017-10-01: 50 ug/kg/min via INTRAVENOUS

## 2017-10-01 MED ORDER — POTASSIUM CHLORIDE CRYS ER 20 MEQ PO TBCR
40.0000 meq | EXTENDED_RELEASE_TABLET | Freq: Once | ORAL | Status: AC
  Administered 2017-10-01: 40 meq via ORAL
  Filled 2017-10-01: qty 2

## 2017-10-01 MED ORDER — NEBIVOLOL HCL 5 MG PO TABS
5.0000 mg | ORAL_TABLET | Freq: Every day | ORAL | Status: DC
  Administered 2017-10-02 – 2017-10-03 (×2): 5 mg via ORAL
  Filled 2017-10-01 (×3): qty 1

## 2017-10-01 MED ORDER — PHENYLEPHRINE 40 MCG/ML (10ML) SYRINGE FOR IV PUSH (FOR BLOOD PRESSURE SUPPORT)
PREFILLED_SYRINGE | INTRAVENOUS | Status: DC | PRN
  Administered 2017-10-01 (×5): 80 ug via INTRAVENOUS

## 2017-10-01 MED ORDER — FERROUS SULFATE 325 (65 FE) MG PO TABS
325.0000 mg | ORAL_TABLET | Freq: Two times a day (BID) | ORAL | Status: DC
  Administered 2017-10-01 – 2017-10-03 (×4): 325 mg via ORAL
  Filled 2017-10-01 (×5): qty 1

## 2017-10-01 MED ORDER — GLUCAGON HCL RDNA (DIAGNOSTIC) 1 MG IJ SOLR
INTRAMUSCULAR | Status: DC | PRN
  Administered 2017-10-01: .5 mg via INTRAVENOUS

## 2017-10-01 MED ORDER — PROPOFOL 10 MG/ML IV BOLUS
INTRAVENOUS | Status: DC | PRN
  Administered 2017-10-01: 20 mg via INTRAVENOUS
  Administered 2017-10-01 (×2): 50 mg via INTRAVENOUS
  Administered 2017-10-01 (×2): 40 mg via INTRAVENOUS

## 2017-10-01 MED ORDER — SERTRALINE HCL 100 MG PO TABS
200.0000 mg | ORAL_TABLET | Freq: Every day | ORAL | Status: DC
  Administered 2017-10-01 – 2017-10-03 (×3): 200 mg via ORAL
  Filled 2017-10-01 (×3): qty 2

## 2017-10-01 MED ORDER — LIDOCAINE HCL (CARDIAC) 20 MG/ML IV SOLN
INTRAVENOUS | Status: DC | PRN
  Administered 2017-10-01: 60 mg via INTRAVENOUS
  Administered 2017-10-01: 40 mg via INTRAVENOUS

## 2017-10-01 MED ORDER — LACTATED RINGERS IV SOLN
INTRAVENOUS | Status: DC
  Administered 2017-10-01: 10:00:00 via INTRAVENOUS

## 2017-10-01 MED ORDER — ATORVASTATIN CALCIUM 80 MG PO TABS
80.0000 mg | ORAL_TABLET | Freq: Every day | ORAL | Status: DC
  Administered 2017-10-01 – 2017-10-02 (×2): 80 mg via ORAL
  Filled 2017-10-01 (×3): qty 1

## 2017-10-01 MED ORDER — GLUCAGON HCL RDNA (DIAGNOSTIC) 1 MG IJ SOLR
INTRAMUSCULAR | Status: AC
  Filled 2017-10-01: qty 1

## 2017-10-01 NOTE — Progress Notes (Signed)
PROGRESS NOTE    [name]  ZOX:[id number] DOB: [birth year] DOA: 09/29/2017 PCP: Donald Prose, MD   Brief Narrative: 20yoF with hx CAD s/p 2v CABG (01/2011) and AVR (01/2011), GERD, HTN, Anemia, PVD, Grade 1 diastolic dysfunction, and GIB (2010, 2012 due to duodenal AVM's, another instance due to cecal ulceration), now s/p Right hip arthroplasty (08/22/17), now presents to the ER with AMS, Dark tarry stools x 2 wks, no PO intake x 3-4 days, 1 episode of N/V, and periumbilical abdominal pain. In the ER patient was found to be hypotensive 75/43 and anemic (Hgb 4.4 down from baseline of 8.1). In the ER she was given IV PPI, 3L IVF, and started on 1st unit of pRBC.  Admitted by pulmonary critical care.  Assessment & Plan:  #Severe anemia due to upper GI bleed: -Transfused red blood cell 2 units.  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  It was treated with argon plasma coagulation.  Started on full liquid diet.  Monitor CBC.  A starting oral iron.  Continue PPI.  Follow-up with PCP -Check iron stores, starting oral iron for possible chronic iron deficiency anemia.  #Moderate obstructive sleep apnea on CPAP.  #History of coronary artery disease status post CABG: Currently stable.  Aspirin on hold.  Resume statin, beta-blocker.  #Hemorrhagic shock due to GI bleed: Currently improved.  #Acute kidney injury: Serum creatinine level improved.  Monitor.  #Hypokalemia: Replete potassium chloride.  Repeat lab in the morning.  #Elevated lactic acid level likely due to hypotension.  Monitor lab.   DVT prophylaxis: SCD Code Status: Full code Family Communication: Discussed with the patient daughter Disposition Plan: Currently admitted.  PT OT evaluation    Consultants:   GI  Admitted by pulmonary critical care  Procedures: Endoscopy Antimicrobials: None  Subjective: Seen and examined at bedside.  Headache,  dizziness, nausea vomiting chest pain. has weakness.  Objective: Vitals:   10/01/17 1235 10/01/17 1240 10/01/17 1245 10/01/17 1300  BP: (!) 113/47 (!) 109/35 131/63 111/62  Pulse: 64 63 72 63  Resp: (!) 21 19 (!) 23 13  Temp:      TempSrc:      SpO2: 100% 100% 100% 100%  Weight:      Height:        Intake/Output Summary (Last 24 hours) at 10/01/2017 1316 Last data filed at 10/01/2017 1210 Gross per 24 hour  Intake 1260 ml  Output 1330 ml  Net -70 ml   Filed Weights   09/30/17 0400 10/01/17 0500 10/01/17 1018  Weight: 65 kg (143 lb 4.8 oz) 64.6 kg (142 lb 6.7 oz) 64.6 kg (142 lb 6.7 oz)    Examination:  General exam: Appears calm and comfortable  Respiratory system: Clear to auscultation. Respiratory effort normal. No wheezing or crackle Cardiovascular system: S1 & S2 heard, RRR.  No pedal edema. Gastrointestinal system: Abdomen is nondistended, soft and nontender. Normal bowel sounds heard. Central nervous system: Alert and oriented. No focal neurological deficits. Extremities: Symmetric 5 x 5 power. Skin: No rashes, lesions or ulcers Psychiatry: Judgement and insight appear normal. Mood & affect appropriate.     Data Reviewed: I have personally reviewed following labs and imaging studies  CBC: Recent Labs  Lab 09/29/17 2244 09/30/17 0602 09/30/17 1705 10/01/17 0332  WBC 6.5 8.7  --  7.5  NEUTROABS 4.2  --   --   --   HGB 4.4* 7.4* 7.4* 7.4*  HCT 15.3* 24.1*  24.3* 23.2*  MCV 84.5 86.7  --  86.2  PLT 207 189  --  038   Basic Metabolic Panel: Recent Labs  Lab 09/29/17 2244 09/30/17 0602 10/01/17 0332  NA 137 138 139  K 4.1 4.2 3.1*  CL 108 112* 111  CO2 16* 16* 19*  GLUCOSE 132* 89 74  BUN 27* 26* 13  CREATININE 1.37* 1.10* 0.69  CALCIUM 8.4* 7.9* 8.5*  MG  --  1.8  --   PHOS  --  4.6  --    GFR: Estimated Creatinine Clearance: 46.3 mL/min (by C-G formula based on SCr of 0.69 mg/dL). Liver Function Tests: Recent Labs  Lab 09/29/17 2244  AST 67*   ALT 46  ALKPHOS 72  BILITOT 0.4  PROT 6.1*  ALBUMIN 2.9*   No results for input(s): LIPASE, AMYLASE in the last 168 hours. No results for input(s): AMMONIA in the last 168 hours. Coagulation Profile: Recent Labs  Lab 09/30/17 0135  INR 1.38   Cardiac Enzymes: Recent Labs  Lab 09/30/17 1052  TROPONINI 0.06*   BNP (last 3 results) Recent Labs    04/08/17 0841  PROBNP 258   HbA1C: No results for input(s): HGBA1C in the last 72 hours. CBG: Recent Labs  Lab 09/29/17 2347 09/30/17 0409 09/30/17 1953  GLUCAP 143* 129* 82   Lipid Profile: No results for input(s): CHOL, HDL, LDLCALC, TRIG, CHOLHDL, LDLDIRECT in the last 72 hours. Thyroid Function Tests: No results for input(s): TSH, T4TOTAL, FREET4, T3FREE, THYROIDAB in the last 72 hours. Anemia Panel: No results for input(s): VITAMINB12, FOLATE, FERRITIN, TIBC, IRON, RETICCTPCT in the last 72 hours. Sepsis Labs: Recent Labs  Lab 09/29/17 2306 09/30/17 0135 09/30/17 0602  LATICACIDVEN 5.39* 5.9* 2.2*    Recent Results (from the past 240 hour(s))  MRSA PCR Screening     Status: None   Collection Time: 09/30/17  3:42 AM  Result Value Ref Range Status   MRSA by PCR NEGATIVE NEGATIVE Final    Comment:        The GeneXpert MRSA Assay (FDA approved for NASAL specimens only), is one component of a comprehensive MRSA colonization surveillance program. It is not intended to diagnose MRSA infection nor to guide or monitor treatment for MRSA infections.          Radiology Studies: No results found.      Scheduled Meds: . atorvastatin  80 mg Oral q1800  . ferrous sulfate  325 mg Oral BID WC  . pantoprazole  40 mg Intravenous Q12H  . potassium chloride  40 mEq Oral Once  . sertraline  200 mg Oral Daily  . tiotropium  18 mcg Inhalation Daily   Continuous Infusions: . sodium chloride       LOS: 1 day    Dron Tanna Furry, MD [facility] Pager [phone]  If 7PM-7AM, please  contact night-coverage [email or web address] Password [email or web address] 10/01/2017, 1:16 PM

## 2017-10-01 NOTE — Transfer of Care (Signed)
Immediate Anesthesia Transfer of Care Note  Patient: [name]  Procedure(s) Performed: ENTEROSCOPY (N/A )  Patient Location: Endoscopy Unit  Anesthesia Type:MAC  Level of Consciousness: sedated  Airway & Oxygen Therapy: Patient Spontanous Breathing and Patient connected to nasal cannula oxygen  Post-op Assessment: Report given to RN and Post -op Vital signs reviewed and stable  Post vital signs: Reviewed and stable  Last Vitals:  Vitals:   10/01/17 1018 10/01/17 1037  BP: (!) 127/51   Pulse: 60   Resp: (!) 25 (!) 28  Temp: 36.9 C   SpO2: 92% 97%    Last Pain:  Vitals:   10/01/17 1018  TempSrc: Oral  PainSc:       Patients Stated Pain Goal: 6 (78/58/85 0277)  Complications: No apparent anesthesia complications

## 2017-10-01 NOTE — Progress Notes (Signed)
Pt transported to endoscopy suite with RN on monitor. Family aware. Pt stable at transfer on RA. A&Ox's 4. HR 69. RR increased but pt resting comfortably.

## 2017-10-01 NOTE — Progress Notes (Signed)
Pt refusing CPAP. Will notify if she changes her mind

## 2017-10-01 NOTE — Progress Notes (Signed)
Pt returned from endo suite, A&Ox's 4. Drowsy. VSS. On 3L [place]. Denies pain.

## 2017-10-01 NOTE — Interval H&P Note (Signed)
History and Physical Interval Note:  10/01/2017 10:17 AM  [name]  has presented today for surgery, with the diagnosis of FOBT + anemia, s/p 2 U PRBC.  hx sb and colon avm's  The various methods of treatment have been discussed with the patient and family. After consideration of risks, benefits and other options for treatment, the patient has consented to  Procedure(s): ENTEROSCOPY (N/A) as a surgical intervention .  The patient's history has been reviewed, patient examined, no change in status, stable for surgery.  I have reviewed the patient's chart and labs.  Questions were answered to the patient's satisfaction.     Scarlette Shorts

## 2017-10-01 NOTE — Anesthesia Postprocedure Evaluation (Signed)
Anesthesia Post Note  Patient: [name]  Procedure(s) Performed: ENTEROSCOPY (N/A )     Patient location during evaluation: PACU Anesthesia Type: MAC Level of consciousness: awake and alert Pain management: pain level controlled Vital Signs Assessment: post-procedure vital signs reviewed and stable Respiratory status: spontaneous breathing, nonlabored ventilation, respiratory function stable and patient connected to nasal cannula oxygen Cardiovascular status: stable and blood pressure returned to baseline Postop Assessment: no apparent nausea or vomiting Anesthetic complications: no    Last Vitals:  Vitals:   10/01/17 1240 10/01/17 1245  BP: (!) 109/35 131/63  Pulse: 63 72  Resp: 19 (!) 23  Temp:    SpO2: 100% 100%    Last Pain:  Vitals:   10/01/17 1245  TempSrc:   PainSc: 0-No pain                 [place] Whedbee,W. EDMOND

## 2017-10-01 NOTE — Anesthesia Preprocedure Evaluation (Addendum)
Anesthesia Evaluation  Patient identified by MRN, date of birth, ID band Patient awake    Reviewed: Allergy & Precautions, H&P , NPO status , Patient's Chart, lab work & pertinent test results, reviewed documented beta blocker date and time   Airway Mallampati: III  TM Distance: >3 FB Neck ROM: Full    Dental no notable dental hx. (+) Partial Upper, Dental Advisory Given   Pulmonary neg pulmonary ROS, Current Smoker,    Pulmonary exam normal breath sounds clear to auscultation       Cardiovascular hypertension, Pt. on medications and Pt. on home beta blockers + CAD, + Past MI, + CABG and + Peripheral Vascular Disease  negative cardio ROS   Rhythm:Regular Rate:Normal + Systolic murmurs- Diastolic murmurs    Neuro/Psych Anxiety Depression negative neurological ROS  negative psych ROS   GI/Hepatic negative GI ROS, Neg liver ROS, GERD  Medicated and Controlled,  Endo/Other  negative endocrine ROS  Renal/GU negative Renal ROS  negative genitourinary   Musculoskeletal  (+) Arthritis ,   Abdominal   Peds  Hematology negative hematology ROS (+) anemia ,   Anesthesia Other Findings   Reproductive/Obstetrics negative OB ROS                            Anesthesia Physical Anesthesia Plan  ASA: III  Anesthesia Plan: MAC   Post-op Pain Management:    Induction: Intravenous  PONV Risk Score and Plan: 1 and Treatment may vary due to age or medical condition and Propofol infusion  Airway Management Planned: Nasal Cannula  Additional Equipment:   Intra-op Plan:   Post-operative Plan:   Informed Consent: I have reviewed the patients History and Physical, chart, labs and discussed the procedure including the risks, benefits and alternatives for the proposed anesthesia with the patient or authorized representative who has indicated his/her understanding and acceptance.   Dental advisory  given  Plan Discussed with: CRNA  Anesthesia Plan Comments:         Anesthesia Quick Evaluation

## 2017-10-01 NOTE — Anesthesia Procedure Notes (Signed)
Procedure Name: MAC Date/Time: 10/01/2017 11:42 AM Performed by: Lieutenant Diego, CRNA Pre-anesthesia Checklist: Patient identified, Emergency Drugs available, Suction available, Patient being monitored and Timeout performed Patient Re-evaluated:Patient Re-evaluated prior to induction Preoxygenation: Pre-oxygenation with 100% oxygen Induction Type: IV induction

## 2017-10-01 NOTE — Progress Notes (Signed)
Pt transferring to 5W20. Report given to [facility]. Updated Daughter Verdene Lennert, now aware of transfer to 5W. 1 pt belongings bag with denture cup and 1 partial, 1 pair of brown rimmed eyeglasses. 1 black personal belongings bag on bed with patient.

## 2017-10-01 NOTE — Progress Notes (Signed)
Patient consent for endoscopy signed and placed in shadow chart.

## 2017-10-01 NOTE — Progress Notes (Signed)
Pt's Hgb stable overnight at 7.4 x 3 K low at 3.1. Troponin I < 0.03 x 2, 0.06.  Clinically stable, though ongoing low BPs 90s/50s , O2 sats as low as 89%, no tachycardia   For enterscopy at 11 this AM  Ordered Potassium 57meq po now.    Azucena Freed PA-C [phone].

## 2017-10-01 NOTE — Op Note (Signed)
[facility] Patient Name: [name] Procedure Date : 10/01/2017 MRN: [id number] Attending MD: Docia Chuck. Henrene Pastor , MD Date of Birth: 07-20-1941 CSN: [id number] Age: 77 Admit Type: Inpatient Procedure:                Small bowel enteroscopy, with control of bleeding                            (APC) Indications:              Melena. Known history of gastrointestinal AVMs Providers:                Docia Chuck. Henrene Pastor, MD, Burtis Junes, RN, William Dalton,                            Technician Referring MD:             [facility] Medicines:                Monitored Anesthesia Care Complications:            No immediate complications. Estimated Blood Loss:     Estimated blood loss: none. Procedure:                Pre-Anesthesia Assessment:                           - Prior to the procedure, a History and Physical                            was performed, and patient medications and                            allergies were reviewed. The patient's tolerance of                            previous anesthesia was also reviewed. The risks                            and benefits of the procedure and the sedation                            options and risks were discussed with the patient.                            All questions were answered, and informed consent                            was obtained. Prior Anticoagulants: The patient has                            taken no previous anticoagulant or antiplatelet                            agents. ASA Grade Assessment: III - A patient with  severe systemic disease. After reviewing the risks                            and benefits, the patient was deemed in                            satisfactory condition to undergo the procedure.                           After obtaining informed consent, the endoscope was                            passed under direct vision. Throughout the   procedure, the patient's blood pressure, pulse, and                            oxygen saturations were monitored continuously. The                            [id number] ([id number]) scope was introduced through                            the mouth and advanced to the proximal jejunum. The                            small bowel enteroscopy was accomplished without                            difficulty. The patient tolerated the procedure                            well. Scope In: Scope Out: Findings:      The esophagus was normal.      The stomach was normal.      Multiple angiodysplastic lesions with no bleeding were found in the       second and third portion of the duodenum. No actively bleeding lesions.       Coagulation for hemostasis using argon plasma was successful.      Multiple angiodysplastic lesions with no bleeding were found in the       proximal jejunum. No actively bleeding lesions. Coagulation for       hemostasis using argon plasma was successful. Impression:               - Normal esophagus.                           - Normal stomach.                           - Multiple non-bleeding angiodysplastic lesions in                            the duodenum. Treated with argon plasma coagulation                            (APC).                           -  Multiple non-bleeding angiodysplastic lesions in                            the jejunum. Treated with argon plasma coagulation                            (APC).                           - No specimens collected. Recommendation:           1. Full liquid diet                           2. Monitor stools and blood counts                           3.Transfuse as clinically indicated                           4. Will need chronic iron therapyindefinitely                           5. Will need her PCP tomonitor blood counts                            periodically                           GI will continue to follow.. Procedure  Code(s):        --- Professional ---                           [phone], Small intestinal endoscopy, enteroscopy                            beyond second portion of duodenum, not including                            ileum; with control of bleeding (eg, injection,                            bipolar cautery, unipolar cautery, laser, heater                            probe, stapler, plasma coagulator) Diagnosis Code(s):        --- Professional ---                           J88.416, Angiodysplasia of stomach and duodenum                            without bleeding                           K55.20, Angiodysplasia of colon without hemorrhage  K92.1, Melena (includes Hematochezia) CPT copyright 2016 American Medical Association. All rights reserved. The codes documented in this report are preliminary and upon coder review may  be revised to meet current compliance requirements. Docia Chuck. Henrene Pastor, MD 10/01/2017 12:30:17 PM This report has been signed electronically. Number of Addenda: 0

## 2017-10-02 ENCOUNTER — Encounter (HOSPITAL_COMMUNITY): Payer: Self-pay | Admitting: Internal Medicine

## 2017-10-02 LAB — BASIC METABOLIC PANEL
ANION GAP: 9 (ref 5–15)
BUN: 7 mg/dL (ref 6–20)
CALCIUM: 8.4 mg/dL — AB (ref 8.9–10.3)
CHLORIDE: 111 mmol/L (ref 101–111)
CO2: 19 mmol/L — ABNORMAL LOW (ref 22–32)
Creatinine, Ser: 0.54 mg/dL (ref 0.44–1.00)
GFR calc non Af Amer: >60 mL/min (ref >60)
Glucose, Bld: 72 mg/dL (ref 65–99)
Potassium: 3.9 mmol/L (ref 3.5–5.1)
SODIUM: 139 mmol/L (ref 135–145)

## 2017-10-02 LAB — CBC
HEMATOCRIT: 23.1 % — AB (ref 36.0–46.0)
HEMOGLOBIN: 7.1 g/dL — AB (ref 12.0–15.0)
MCH: 26.6 pg (ref 26.0–34.0)
MCHC: 30.7 g/dL (ref 30.0–36.0)
MCV: 86.5 fL (ref 78.0–100.0)
Platelets: 138 10*3/uL — ABNORMAL LOW (ref 150–400)
RBC: 2.67 MIL/uL — ABNORMAL LOW (ref 3.87–5.11)
RDW: 17.3 % — AB (ref 11.5–15.5)
WBC: 6.1 10*3/uL (ref 4.0–10.5)

## 2017-10-02 LAB — PREPARE RBC (CROSSMATCH)

## 2017-10-02 MED ORDER — SODIUM CHLORIDE 0.9 % IV SOLN: Freq: Once | INTRAVENOUS | Status: DC

## 2017-10-02 MED ORDER — PANTOPRAZOLE SODIUM 40 MG PO TBEC
40.0000 mg | DELAYED_RELEASE_TABLET | Freq: Every day | ORAL | Status: DC
  Administered 2017-10-02 – 2017-10-03 (×2): 40 mg via ORAL
  Filled 2017-10-02 (×2): qty 1

## 2017-10-02 NOTE — NC FL2 (Signed)
[place] LEVEL OF CARE SCREENING TOOL     IDENTIFICATION  Patient Name: [name] Birthdate: [birth year] Sex: female Admission Date (Current Location): 09/29/2017  [facility] and [place] Number:  Herbalist and Address:  [facility] [place], [place] [address] [place], [place] [address]      Provider Number: [id number]  Attending Physician Name and Address:  Rosita Fire, MD  Relative Name and Phone Number:  Verdene Lennert, daughter, [phone]    Current Level of Care: Hospital Recommended Level of Care: [place] Prior Approval Number:    Date Approved/Denied:   PASRR Number: [id number]  Discharge Plan: SNF    Current Diagnoses: Patient Active Problem List   Diagnosis Date Noted  . Angiodysplasia of duodenum   . Angiodysplasia of small intestine, except duodenum with bleeding   . Hypotension due to hypovolemia   . UGI bleed   . Hemorrhagic shock ([place]) 09/30/2017  . 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 Symptomatic anemia 04/10/2017  . Generalized weakness   . Heme positive stool   . PAH (pulmonary artery hypertension) ([place])   . Epigastric burning sensation 04/19/2015  . Chronic diarrhea 04/19/2015  . History of GI bleed 03/14/2015  . Long-term use of high-risk medication 01/11/2015  . Dysphagia 01/11/2015  . S/P AVR 01/11/2015  . Dyspnea 01/11/2015  . Tobacco abuse 01/11/2015  . Numbness of anterior thigh-Left 12/09/2014  . Pain in joint, lower leg 12/09/2014  . Essential hypertension 07/14/2014  . Coronary artery disease involving coronary bypass graft of native heart without angina pectoris 07/14/2014  . Dilated aortic root ([place]) 07/14/2014  . H/O aortic valve replacement 07/14/2014  . Tobacco use 07/14/2014  . Obesity 07/14/2014  . Palpitations 01/03/2014  . Aortic valve disorder 06/19/2013  . Heart valve replaced by other means 06/19/2013  . Flatulence, eructation, and  gas pain 06/19/2013  . Pure hypercholesterolemia 06/19/2013  . 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 Hypopotassemia 06/19/2013  . Anemia, unspecified 06/19/2013  . Anxiety state, unspecified 06/19/2013  . Esophageal reflux 06/19/2013  . Personal history of other diseases of digestive system 06/19/2013  . Iron deficiency anemia, unspecified 06/19/2013  . Proteinuria 06/19/2013  . Arthropathy, unspecified, site unspecified 06/19/2013  . Major depressive disorder, single episode, unspecified 06/19/2013  . 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 Tobacco use disorder 06/19/2013  . Peripheral vascular disease ([place]) 10/14/2012  . Atherosclerosis of aorta ([place]) 10/14/2012  . Pain in limb 10/14/2012  . Atherosclerosis of abdominal aorta ([place]) 10/16/2011  . PVD (peripheral vascular disease) ([place]) 10/16/2011    Orientation RESPIRATION BLADDER Height & Weight     Self, Time, Situation, Place  O2(Nasal cannula 2L) Continent Weight: 63.2 kg (139 lb 5.3 oz) Height:  4\' 9"  (144.8 cm)  BEHAVIORAL SYMPTOMS/MOOD NEUROLOGICAL BOWEL NUTRITION STATUS      Incontinent Diet(Please see DC Summary)  AMBULATORY STATUS COMMUNICATION OF NEEDS Skin   Limited Assist Verbally Normal                       Personal Care Assistance Level of Assistance  Bathing, Feeding, Dressing Bathing Assistance: Maximum assistance Feeding assistance: Limited assistance Dressing Assistance: Limited assistance     Functional Limitations Info             SPECIAL CARE FACTORS FREQUENCY  PT (By licensed PT)     PT Frequency: 5x/week  Contractures      Additional Factors Info  Code Status, Allergies Code Status Info: Full Allergies Info: [place]           Current Medications (10/02/2017):  This is the current hospital active medication list Current Facility-Administered Medications  Medication Dose Route Frequency Provider Last Rate Last Dose  . 0.9 %  sodium  chloride infusion  250 mL Intravenous PRN Corey Harold, NP      . 0.9 %  sodium chloride infusion   Intravenous Once Rosita Fire, MD      . acetaminophen (TYLENOL) tablet 650 mg  650 mg Oral Q6H PRN Noe Gens L, NP   650 mg at 10/01/17 2123  . atorvastatin (LIPITOR) tablet 80 mg  80 mg Oral q1800 Rosita Fire, MD   80 mg at 10/01/17 1801  . benzonatate (TESSALON) capsule 100 mg  100 mg Oral TID PRN Arby Barrette A, NP   100 mg at 10/01/17 2322  . ferrous sulfate tablet 325 mg  325 mg Oral BID WC Rosita Fire, MD   325 mg at 10/02/17 1034  . nebivolol (BYSTOLIC) tablet 5 mg  5 mg Oral Daily Rosita Fire, MD   5 mg at 10/02/17 1034  . pantoprazole (PROTONIX) EC tablet 40 mg  40 mg Oral Q0600 Vena Rua, PA-C   40 mg at 10/02/17 1257  . sertraline (ZOLOFT) tablet 200 mg  200 mg Oral Daily Rosita Fire, MD   200 mg at 10/02/17 1034  . tiotropium (SPIRIVA) inhalation capsule 18 mcg  18 mcg Inhalation Daily Corey Harold, NP   18 mcg at 10/02/17 0825     Discharge Medications: Please see discharge summary for a list of discharge medications.  Relevant Imaging Results:  Relevant Lab Results:   Additional Information SS: [place], [place]

## 2017-10-02 NOTE — Progress Notes (Signed)
PROGRESS NOTE    [name]  CNO:[id number] DOB: 07/12/1941 DOA: 09/29/2017 PCP: Donald Prose, MD   Brief Narrative: 31yoF with hx CAD s/p 2v CABG (01/2011) and AVR (01/2011), GERD, HTN, Anemia, PVD, Grade 1 diastolic dysfunction, and GIB (2010, 2012 due to duodenal AVM's, another instance due to cecal ulceration), now s/p Right hip arthroplasty (08/22/17), now presents to the ER with AMS, Dark tarry stools x 2 wks, no PO intake x 3-4 days, 1 episode of N/V, and periumbilical abdominal pain. In the ER patient was found to be hypotensive 75/43 and anemic (Hgb 4.4 down from baseline of 8.1). In the ER she was given IV PPI, 3L IVF, and started on 1st unit of pRBC.  Admitted by pulmonary critical care.  Assessment & Plan:  #Severe anemia due to upper GI bleed: -No further GI bleed.  Still have some weakness.  Hemoglobin of 7.1.  Plan for a unit of blood transfusion.  Continue oral iron. - Underwent upper GI endoscopy on 1/9 with multiple nonbleeding angiodysplastic lesion in the duodenum and jejunum.  It was treated with argon plasma coagulation.   -On Protonix -Advance diet  #Moderate obstructive sleep apnea on CPAP.  #History of coronary artery disease status post CABG: Currently stable.  Aspirin on hold.  Resume statin, beta-blocker.  #Hemorrhagic shock due to GI bleed: Currently improved.  #Acute kidney injury: Serum creatinine level improved.  Monitor.  #Hypokalemia: Replete potassium chloride.  Improved  #Elevated lactic acid level likely due to hypotension.  Monitor lab.  PT recommended a skilled nursing home.  Social worker consulted.  Continue supportive care.  DVT prophylaxis: SCD Code Status: Full code Family Communication: No family at bedside Disposition Plan: Currently admitted.  PT OT evaluation    Consultants:   GI  Admitted by pulmonary critical care  Procedures: Endoscopy Antimicrobials: None  Subjective: Seen and examined at bedside.  Feeling weak  tired.  No nausea vomiting.  No further bleeding. Objective: Vitals:   10/01/17 2232 10/02/17 0643 10/02/17 0647 10/02/17 0825  BP: (!) 103/50 122/61    Pulse: (!) 104 74    Resp: 20 (!) 24    Temp: 98.9 F (37.2 C) 98.5 F (36.9 C)    TempSrc: Oral Oral    SpO2: 92% 99%  95%  Weight:   63.2 kg (139 lb 5.3 oz)   Height:        Intake/Output Summary (Last 24 hours) at 10/02/2017 1236 Last data filed at 10/02/2017 1054 Gross per 24 hour  Intake 120 ml  Output -  Net 120 ml   Filed Weights   10/01/17 0500 10/01/17 1018 10/02/17 0647  Weight: 64.6 kg (142 lb 6.7 oz) 64.6 kg (142 lb 6.7 oz) 63.2 kg (139 lb 5.3 oz)    Examination:  General exam: Lying in bed comfortable Respiratory system: Clear bilateral, no wheezing Cardiovascular system: Regular rate rhythm S1-S2 normal.  No pedal edema. Gastrointestinal system: Abdomen soft, nontender.  Normal bowel sound. Central nervous system: Alert and oriented. No focal neurological deficits. Extremities: Symmetric 5 x 5 power. Skin: No rashes, lesions or ulcers Psychiatry: Judgement and insight appear normal. Mood & affect appropriate.     Data Reviewed: I have personally reviewed following labs and imaging studies  CBC: Recent Labs  Lab 09/29/17 2244 09/30/17 0602 09/30/17 1705 10/01/17 0332 10/02/17 0306  WBC 6.5 8.7  --  7.5 6.1  NEUTROABS 4.2  --   --   --   --  HGB 4.4* 7.4* 7.4* 7.4* 7.1*  HCT 15.3* 24.1* 24.3* 23.2* 23.1*  MCV 84.5 86.7  --  86.2 86.5  PLT 207 189  --  176 092*   Basic Metabolic Panel: Recent Labs  Lab 09/29/17 2244 09/30/17 0602 10/01/17 0332 10/02/17 0306  NA 137 138 139 139  K 4.1 4.2 3.1* 3.9  CL 108 112* 111 111  CO2 16* 16* 19* 19*  GLUCOSE 132* 89 74 72  BUN 27* 26* 13 7  CREATININE 1.37* 1.10* 0.69 0.54  CALCIUM 8.4* 7.9* 8.5* 8.4*  MG  --  1.8  --   --   PHOS  --  4.6  --   --    GFR: Estimated Creatinine Clearance: 45.7 mL/min (by C-G formula based on SCr of 0.54  mg/dL). Liver Function Tests: Recent Labs  Lab 09/29/17 2244  AST 67*  ALT 46  ALKPHOS 72  BILITOT 0.4  PROT 6.1*  ALBUMIN 2.9*   No results for input(s): LIPASE, AMYLASE in the last 168 hours. No results for input(s): AMMONIA in the last 168 hours. Coagulation Profile: Recent Labs  Lab 09/30/17 0135  INR 1.38   Cardiac Enzymes: Recent Labs  Lab 09/30/17 1052  TROPONINI 0.06*   BNP (last 3 results) Recent Labs    04/08/17 0841  PROBNP 258   HbA1C: No results for input(s): HGBA1C in the last 72 hours. CBG: Recent Labs  Lab 09/29/17 2347 09/30/17 0409 09/30/17 1953  GLUCAP 143* 129* 82   Lipid Profile: No results for input(s): CHOL, HDL, LDLCALC, TRIG, CHOLHDL, LDLDIRECT in the last 72 hours. Thyroid Function Tests: No results for input(s): TSH, T4TOTAL, FREET4, T3FREE, THYROIDAB in the last 72 hours. Anemia Panel: Recent Labs    10/01/17 1355  FERRITIN 940*  TIBC 322  IRON 212*   Sepsis Labs: Recent Labs  Lab 09/29/17 2306 09/30/17 0135 09/30/17 0602  LATICACIDVEN 5.39* 5.9* 2.2*    Recent Results (from the past 240 hour(s))  MRSA PCR Screening     Status: None   Collection Time: 09/30/17  3:42 AM  Result Value Ref Range Status   MRSA by PCR NEGATIVE NEGATIVE Final    Comment:        The GeneXpert MRSA Assay (FDA approved for NASAL specimens only), is one component of a comprehensive MRSA colonization surveillance program. It is not intended to diagnose MRSA infection nor to guide or monitor treatment for MRSA infections.          Radiology Studies: No results found.      Scheduled Meds: . atorvastatin  80 mg Oral q1800  . ferrous sulfate  325 mg Oral BID WC  . nebivolol  5 mg Oral Daily  . pantoprazole  40 mg Oral Q0600  . sertraline  200 mg Oral Daily  . tiotropium  18 mcg Inhalation Daily   Continuous Infusions: . sodium chloride    . sodium chloride       LOS: 2 days    Jarrod Bodkins Tanna Furry, MD [facility] Pager [phone]  If 7PM-7AM, please contact night-coverage [email or web address] Password [email or web address] 10/02/2017, 12:36 PM

## 2017-10-02 NOTE — Progress Notes (Signed)
CSW received consult regarding PT recommendation of SNF at discharge.  Patient and her daughters are refusing SNF. She was recently there in December and did not like it. Her daughter will take her home to resume her home therapy.   CSW signing off.   Percell Locus [name] LCSWA [phone]

## 2017-10-02 NOTE — Progress Notes (Signed)
Pt IV flushed perfectly and drew back blood. Saline was infusing at [facility] while staff went to pick up blood. Blood checked by 2 RNs and hung at normal rate. Shortly after infusion started, IV began to leak. No success with saving IV or 2nd IV. IV team nearby and also assisted RN with restarting IVs. No success. Blood returned to blood bank and waiting for IV team to bring ultrasound. Pt VSS and no reactions or other complications. Will continue to monitor pt closely and start new unit of blood ASAP. Thanks, Leanne Chang, RN

## 2017-10-02 NOTE — Evaluation (Signed)
Physical Therapy Evaluation Patient Details Name: [name] MRN: [id number] DOB: 07-Nov-1940 Today's Date: 10/02/2017   History of Present Illness  77yo female with dark and FOBT+ stools, also note recent fall landing on L hip. Concern for symptomatic anemia, GI AVMs. Received enterscopy 10/01/17. PMH CAD, CABG with biologic AVR 2012, MI 2010, HTN, PVD, diastolic dysfunction, GERD, chronic GI blood loss anemia, diverticulitis, rectosigmoid colectomy 2005, low pelvic anastomosis, splenic flexure, s/p R anterior hip 08/22/17, aortic stenosis, R shoulder impingement syndrome, hx spine surgery   Clinical Impression   Patient received in bed, pleasant and willing to participate in skilled PT evaluation today. Attempted to obtain resting O2 pulse ox measures however unable to obtain accurate measures via finger pulse oximeter throughout session possibly due to nail polish. She requires Mod assistance for bed mobility and Min assist for functional transfers with RW, however declines up to chair or gait in room due to having soiled brief/requesting CNA to come to clean her. Patient returned to bed with all needs met, all questions/concerns addressed, and bed alarm activated. Patient will likely benefit from skilled rehabilitation care in SNF setting moving forward.     Follow Up Recommendations SNF    Equipment Recommendations  None recommended by PT    Recommendations for Other Services       Precautions / Restrictions Precautions Precautions: Fall Restrictions Weight Bearing Restrictions: No      Mobility  Bed Mobility Overal bed mobility: Needs Assistance Bed Mobility: Supine to Sit;Sit to Supine     Supine to sit: Mod assist Sit to supine: Mod assist   General bed mobility comments: for trunk power up and bringing legs around with supine to sit, for LE management with sit to supine   Transfers Overall transfer level: Needs assistance Equipment used: Rolling walker (2  wheeled) Transfers: Sit to/from Stand Sit to Stand: Min assist         General transfer comment: VC for safety and sequencing   Ambulation/Gait             General Gait Details: side steps along edge of bed, patient declining gait or up to chair in room due to being soiled/requesting to be cleaned by CNA   Stairs            Wheelchair Mobility    Modified Rankin (Stroke Patients Only)       Balance Overall balance assessment: Needs assistance Sitting-balance support: Bilateral upper extremity supported;Feet supported Sitting balance-Leahy Scale: Good     Standing balance support: Bilateral upper extremity supported;During functional activity Standing balance-Leahy Scale: Fair                               Pertinent Vitals/Pain Pain Assessment: Faces Faces Pain Scale: Hurts even more Pain Location: bowels  Pain Descriptors / Indicators: Aching;Sharp Pain Intervention(s): Limited activity within patient's tolerance;Monitored during session    Home Living Family/patient expects to be discharged to:: Private residence Living Arrangements: Children Available Help at Discharge: Family;Available PRN/intermittently Type of Home: Apartment Home Access: Stairs to enter Entrance Stairs-Rails: Can reach both Entrance Stairs-Number of Steps: 3 Home Layout: One level Home Equipment: Walker - 4 wheels;Bedside commode      Prior Function Level of Independence: Independent with assistive device(s)         Comments: amb with RW d/t hip pain; dtr assists with groceries; pt vacuums from seated position     Hand Dominance  Extremity/Trunk Assessment   Upper Extremity Assessment Upper Extremity Assessment: Defer to OT evaluation    Lower Extremity Assessment Lower Extremity Assessment: Generalized weakness    Cervical / Trunk Assessment Cervical / Trunk Assessment: Kyphotic  Communication   Communication: No difficulties  Cognition  Arousal/Alertness: Awake/alert Behavior During Therapy: WFL for tasks assessed/performed;Flat affect Overall Cognitive Status: Within Functional Limits for tasks assessed                                        General Comments General comments (skin integrity, edema, etc.): attempted to monitor O2 sats during session, unable to obtain accurate pulse ox reading due to nail polish on fingernails     Exercises     Assessment/Plan    PT Assessment Patient needs continued PT services  PT Problem List Decreased strength;Decreased mobility;Decreased coordination;Decreased safety awareness;Decreased activity tolerance;Decreased balance;Decreased knowledge of use of DME       PT Treatment Interventions DME instruction;Therapeutic activities;Gait training;Therapeutic exercise;Patient/family education;Stair training;Balance training;Functional mobility training;Neuromuscular re-education    PT Goals (Current goals can be found in the Care Plan section)  Acute Rehab PT Goals Patient Stated Goal: to feel better  PT Goal Formulation: With patient Time For Goal Achievement: 10/16/17 Potential to Achieve Goals: Good    Frequency Min 3X/week   Barriers to discharge        Co-evaluation               AM-PAC PT "6 Clicks" Daily Activity  Outcome Measure Difficulty turning over in bed (including adjusting bedclothes, sheets and blankets)?: Unable Difficulty moving from lying on back to sitting on the side of the bed? : Unable Difficulty sitting down on and standing up from a chair with arms (e.g., wheelchair, bedside commode, etc,.)?: Unable Help needed moving to and from a bed to chair (including a wheelchair)?: A Little Help needed walking in hospital room?: A Little Help needed climbing 3-5 steps with a railing? : A Lot 6 Click Score: 11    End of Session Equipment Utilized During Treatment: Gait belt;Oxygen Activity Tolerance: Patient tolerated treatment  well;Patient limited by fatigue Patient left: in bed;with bed alarm set;with call bell/phone within reach Nurse Communication: Mobility status;Other (comment)(patient requesting to be cleaned by CNA ) PT Visit Diagnosis: Unsteadiness on feet (R26.81);Other abnormalities of gait and mobility (R26.89);Muscle weakness (generalized) (M62.81);History of falling (Z91.81);Difficulty in walking, not elsewhere classified (R26.2);Pain Pain - Right/Left: Left Pain - part of body: Hip    Time: 9311-2162 PT Time Calculation (min) (ACUTE ONLY): 30 min   Charges:   PT Evaluation $PT Eval Moderate Complexity: 1 Mod PT Treatments $Therapeutic Activity: 8-22 mins   PT G Codes:   PT G-Codes **NOT FOR INPATIENT CLASS** Functional Assessment Tool Used: AM-PAC 6 Clicks Basic Mobility;Clinical judgement    Deniece Ree PT, DPT, CBIS  Supplemental Physical Therapist [place]   Pager [phone]

## 2017-10-02 NOTE — Progress Notes (Signed)
Initial Nutrition Assessment  DOCUMENTATION CODES:   Non-severe (moderate) malnutrition in context of chronic illness  INTERVENTION:  1. Ensure Enlive po BID, each supplement provides 350 kcal and 20 grams of protein  NUTRITION DIAGNOSIS:   Moderate Malnutrition related to chronic illness as evidenced by moderate muscle depletion, moderate fat depletion.  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 Patient found with HGB 4.4, upper GI bleed, endoscopy done showing multiple nonbleeding lesions in duodenum and jejenum  Spoke with Ms. Copelin at bedside. She ate grits this morning. Reports poor appetite since having her surgery on "Dec 27th" per chart patient had Right Total Arthoplasty 08/22/2017. She is unsure of her usual body weight. Admits to some weight loss but unsure of how much. Per chart she exhibits an 11 pound/7.3% severe weight loss over 1.25 months.  Normally eats eggs and bacon for breakfast "if I eat it." States she finds meals for lunch and dinner, often either skips one or the other. Eats a meat and a starch generally.  Likely not meeting needs PTA.  Labs reviewed Medications reviewed and include:  Iron   NUTRITION - FOCUSED PHYSICAL EXAM:    Most Recent Value  Orbital Region  Moderate depletion  Upper Arm Region  Severe depletion  Thoracic and Lumbar Region  Moderate depletion  Buccal Region  Moderate depletion   Temple Region  Moderate depletion  Clavicle Bone Region  Severe depletion  Clavicle and Acromion Bone Region  Severe depletion  Scapular Bone Region  Moderate depletion  Dorsal Hand  Moderate depletion  Patellar Region  Moderate depletion  Anterior Thigh Region  Moderate depletion  Posterior Calf Region  Moderate depletion  Edema (RD Assessment)  None  Hair  Reviewed  Eyes  Reviewed  Mouth  Reviewed  Skin  Reviewed  Nails  Reviewed       Diet Order:  Diet Heart Room service appropriate? Yes; Fluid consistency: Thin  EDUCATION NEEDS:   Not appropriate for education at this time  Skin:  Skin Assessment: Reviewed RN Assessment  Last BM:  1/9  Height:   Ht Readings from Last 1 Encounters:  10/01/17 4\' 9"  (1.448 m)    Weight:   Wt Readings from Last 1 Encounters:  10/02/17 139 lb 5.3 oz (63.2 kg)    Ideal Body Weight:  43.18 kg  BMI:  Body mass index is 30.15 kg/m.  Estimated Nutritional Needs:   Kcal:  1580-1880 calories  Protein:  88-101 grams  Fluid:  1.6-1.9L   Satira Anis. Leniyah Martell, MS, RD LDN Inpatient Clinical Dietitian Pager [phone]

## 2017-10-02 NOTE — Progress Notes (Signed)
Daily Rounding Note  10/02/2017, 10:24 AM  LOS: 2 days   SUBJECTIVE:   Chief complaint:  Pain in hips and feels weak   No abd pain.  Stools are dark, green on PO iron.     OBJECTIVE:         Vital signs in last 24 hours:    Temp:  [97.8 F (36.6 C)-98.9 F (37.2 C)] 98.5 F (36.9 C) (01/10 0643) Pulse Rate:  [61-104] 74 (01/10 0643) Resp:  [13-28] 24 (01/10 0643) BP: (68-131)/(34-63) 122/61 (01/10 0643) SpO2:  [92 %-100 %] 95 % (01/10 0825) Weight:  [63.2 kg (139 lb 5.3 oz)] 63.2 kg (139 lb 5.3 oz) (01/10 0647) Last BM Date: 10/01/17 Filed Weights   10/01/17 0500 10/01/17 1018 10/02/17 0647  Weight: 64.6 kg (142 lb 6.7 oz) 64.6 kg (142 lb 6.7 oz) 63.2 kg (139 lb 5.3 oz)   General: staff bathing pt in bed.  She is uncomfortable when asked to turn   Heart: RRR Chest: clear bil.  No labored breathing Abdomen: soft, NT, ND.    Extremities: no CCE Neuro/Psych:  Oriented x 3.  Appropriate.    Intake/Output from previous day: 01/09 0701 - 01/10 0700 In: 600 [I.V.:600] Out: 250 [Urine:250]  Intake/Output this shift: No intake/output data recorded.  Lab Results: Recent Labs    09/30/17 0602 09/30/17 1705 10/01/17 0332 10/02/17 0306  WBC 8.7  --  7.5 6.1  HGB 7.4* 7.4* 7.4* 7.1*  HCT 24.1* 24.3* 23.2* 23.1*  PLT 189  --  176 138*   BMET Recent Labs    09/30/17 0602 10/01/17 0332 10/02/17 0306  NA 138 139 139  K 4.2 3.1* 3.9  CL 112* 111 111  CO2 16* 19* 19*  GLUCOSE 89 74 72  BUN 26* 13 7  CREATININE 1.10* 0.69 0.54  CALCIUM 7.9* 8.5* 8.4*   LFT Recent Labs    09/29/17 2244  PROT 6.1*  ALBUMIN 2.9*  AST 67*  ALT 46  ALKPHOS 72  BILITOT 0.4   PT/INR Recent Labs    09/30/17 0135  LABPROT 16.8*  INR 1.38   Hepatitis Panel No results for input(s): HEPBSAG, HCVAB, HEPAIGM, HEPBIGM in the last 72 hours.  Studies/Results: No results found.   Scheduled Meds: . atorvastatin  80 mg  Oral q1800  . ferrous sulfate  325 mg Oral BID WC  . nebivolol  5 mg Oral Daily  . pantoprazole  40 mg Intravenous Q12H  . sertraline  200 mg Oral Daily  . tiotropium  18 mcg Inhalation Daily   Continuous Infusions: . sodium chloride     PRN Meds:.sodium chloride, acetaminophen, benzonatate   ASSESMENT:   *   Recurrent blood loss anemia and FOBT + stools.  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  Multiple previous endoscopic procedures.     10/01/16 EGD: multiple, non-bleeding duodenal and prox jejunal AVMs were all treated with APC laser.   S/p PRBC x 2.  Hgb 4.4 >> 7.4 >> 7.1.      PLAN   *  Switch back to oral Protonix.  Continue oral iron bid.  ? Dose with parenteral iron now to "fill up the tank"? Advance from [facility] to [place].    *  Close follow up of CBC post discharge.    Azucena Freed  10/02/2017, 10:24 AM Pager: [phone]  GI ATTENDING  Interval history and data reviewed. Agree with interval progress note as outlined. Stable without evidence of ongoing GI bleeding. 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 Agree with transfusion to hemoglobin of 8. Agree with long-term iron replacement therapy as she is likely to have at least chronic oozing from her AVMs. Close monitoring of blood counts by her primary provider. If she were to have more problems with bleeding one could consider repeat colonoscopy to assess for and treat colonic AVMs. As well, consider capsule endoscopy to assess for mid to distal small bowel AVMs which would require deep enteroscopy at a tertiary care center with the appropriate equipment. She may have diet of choice. No further recommendations or plans from GI standpoint. We are available if needed. Will sign off.  Docia Chuck. Geri [place]., M.D. [facility] Division of Gastroenterology

## 2017-10-02 NOTE — Progress Notes (Signed)
Patient refused CPAP for the night. Patient stated she does not wear CPAP at night at home. Patient wearing oxygen set at 2lpm with Sp02=97%. Patient does not appear to be in any distress at this time, will continue to monitor.

## 2017-10-02 NOTE — Evaluation (Signed)
Occupational Therapy Evaluation Patient Details Name: [name] MRN: [id number] DOB: [birth year] Today's Date: 10/02/2017    History of Present Illness 77yo female with dark and FOBT+ stools, also note recent fall landing on L hip. Concern for symptomatic anemia, GI AVMs. Received enterscopy 10/01/17. PMH CAD, CABG with biologic AVR 2012, MI 2010, HTN, PVD, diastolic dysfunction, GERD, chronic GI blood loss anemia, diverticulitis, rectosigmoid colectomy 2005, low pelvic anastomosis, splenic flexure, s/p R anterior hip 08/22/17, aortic stenosis, R shoulder impingement syndrome, hx spine surgery    Clinical Impression   Pt with decline in function and safety with ADLs and ADL mobility with decreased strength, balance and endurance. Pt would benefit from acute OT services to address impairments to maximize level of function and safety    Follow Up Recommendations  SNF    Equipment Recommendations  None recommended by OT;Other (comment)(TBD at next venue of care)    Recommendations for Other Services       Precautions / Restrictions Precautions Precautions: Fall Restrictions Weight Bearing Restrictions: No      Mobility Bed Mobility Overal bed mobility: Needs Assistance Bed Mobility: Supine to Sit;Sit to Supine     Supine to sit: Mod assist Sit to supine: Mod assist   General bed mobility comments: for trunk power up and bringing legs around with supine to sit, for LE management with sit to supine   Transfers Overall transfer level: Needs assistance Equipment used: Rolling walker (2 wheeled) Transfers: Sit to/from Stand Sit to Stand: Min assist         General transfer comment: VC for safety and sequencing     Balance Overall balance assessment: Needs assistance Sitting-balance support: Bilateral upper extremity supported;Feet supported Sitting balance-Leahy Scale: Good     Standing balance support: Bilateral upper extremity supported;During functional  activity Standing balance-Leahy Scale: Fair                             ADL either performed or assessed with clinical judgement   ADL Overall ADL's : Needs assistance/impaired Eating/Feeding: Independent;Sitting   Grooming: Wash/dry hands;Wash/dry face;Min guard;Sitting   Upper Body Bathing: Min guard;Sitting   Lower Body Bathing: Maximal assistance   Upper Body Dressing : Min guard;Sitting   Lower Body Dressing: Maximal assistance   Toilet Transfer: Moderate assistance;Minimal assistance;Stand-pivot;BSC   Toileting- Clothing Manipulation and Hygiene: Moderate assistance       Functional mobility during ADLs: Minimal assistance       Vision Baseline Vision/History: Wears glasses Wears Glasses: Reading only Patient Visual Report: No change from baseline       Perception     Praxis      Pertinent Vitals/Pain Pain Assessment: Faces Pain Score: 4  Faces Pain Scale: Hurts little more Pain Location: bowels  Pain Descriptors / Indicators: Aching;Sharp Pain Intervention(s): Limited activity within patient's tolerance;Monitored during session;Repositioned     Hand Dominance Right   Extremity/Trunk Assessment Upper Extremity Assessment Upper Extremity Assessment: Generalized weakness   Lower Extremity Assessment Lower Extremity Assessment: Defer to PT evaluation   Cervical / Trunk Assessment Cervical / Trunk Assessment: Kyphotic   Communication Communication Communication: No difficulties   Cognition Arousal/Alertness: Awake/alert Behavior During Therapy: WFL for tasks assessed/performed;Flat affect Overall Cognitive Status: Within Functional Limits for tasks assessed  General Comments  attempted to monitor O2 sats during session, unable to obtain accurate pulse ox reading due to nail polish on fingernails     Exercises     Shoulder Instructions      Home Living Family/patient expects to  be discharged to:: Private residence Living Arrangements: Children Available Help at Discharge: Family;Available PRN/intermittently Type of Home: Apartment Home Access: Stairs to enter Entrance Stairs-Number of Steps: 3 Entrance Stairs-Rails: Can reach both Home Layout: One level     Bathroom Shower/Tub: Teacher, early years/pre: Standard     Home Equipment: Environmental consultant - 4 wheels;Bedside commode          Prior Functioning/Environment Level of Independence: Independent with assistive device(s)        Comments: amb with RW d/t hip pain; dtr assists with groceries; pt vacuums from seated position        OT Problem List: Decreased strength;Decreased activity tolerance;Decreased knowledge of use of DME or AE;Cardiopulmonary status limiting activity;Impaired balance (sitting and/or standing);Decreased coordination;Pain      OT Treatment/Interventions: Self-care/ADL training;DME and/or AE instruction;Therapeutic activities;Patient/family education    OT Goals(Current goals can be found in the care plan section) Acute Rehab OT Goals Patient Stated Goal: to feel better  OT Goal Formulation: With patient Time For Goal Achievement: 10/16/17 Potential to Achieve Goals: Good ADL Goals Pt Will Perform Grooming: with supervision;with set-up;sitting Pt Will Perform Upper Body Bathing: with supervision;with set-up;sitting Pt Will Perform Upper Body Dressing: with supervision;with set-up;sitting Pt Will Transfer to Toilet: with min assist;with min guard assist;bedside commode Pt Will Perform Toileting - Clothing Manipulation and hygiene: with min assist;sit to/from stand  OT Frequency: Min 2X/week   Barriers to D/C: Decreased caregiver support          Co-evaluation              AM-PAC PT "6 Clicks" Daily Activity     Outcome Measure Help from another person eating meals?: None Help from another person taking care of personal grooming?: A Little Help from another  person toileting, which includes using toliet, bedpan, or urinal?: A Lot Help from another person bathing (including washing, rinsing, drying)?: A Lot Help from another person to put on and taking off regular upper body clothing?: A Little Help from another person to put on and taking off regular lower body clothing?: A Lot 6 Click Score: 16   End of Session Equipment Utilized During Treatment: Gait belt;Rolling walker;Other (comment)(BSC)  Activity Tolerance: Patient limited by fatigue Patient left: in bed;with call bell/phone within reach;with bed alarm set  OT Visit Diagnosis: Unsteadiness on feet (R26.81);History of falling (Z91.81);Pain;Muscle weakness (generalized) (M62.81) Pain - part of body: (back)                Time: 1829-9371 OT Time Calculation (min): 25 min Charges:  OT General Charges $OT Visit: 1 Visit OT Evaluation $OT Eval Moderate Complexity: 1 Mod OT Treatments $Therapeutic Activity: 8-22 mins G-Codes: OT G-codes **NOT FOR INPATIENT CLASS** Functional Assessment Tool Used: AM-PAC 6 Clicks Daily Activity     Britt Bottom 10/02/2017, 2:47 PM

## 2017-10-03 ENCOUNTER — Inpatient Hospital Stay (HOSPITAL_COMMUNITY): Payer: Medicare Other

## 2017-10-03 DIAGNOSIS — R109 Unspecified abdominal pain: Secondary | ICD-10-CM

## 2017-10-03 LAB — BPAM RBC
BLOOD PRODUCT EXPIRATION DATE: 201901192359
BLOOD PRODUCT EXPIRATION DATE: 201902012359
Blood Product Expiration Date: 201901242359
Blood Product Expiration Date: 201902012359
ISSUE DATE / TIME: 201901080048
ISSUE DATE / TIME: 201901080245
ISSUE DATE / TIME: 201901102240
ISSUE DATE / TIME: 201901101457
UNIT TYPE AND RH: 5100
UNIT TYPE AND RH: 5100
UNIT TYPE AND RH: 5100
UNIT TYPE AND RH: 5100

## 2017-10-03 LAB — TYPE AND SCREEN
ABO/RH(D): O POS
Antibody Screen: NEGATIVE
DONOR AG TYPE: NEGATIVE
Donor AG Type: NEGATIVE
Donor AG Type: NEGATIVE
Donor AG Type: NEGATIVE
UNIT DIVISION: 0
UNIT DIVISION: 0
UNIT DIVISION: 0
Unit division: 0

## 2017-10-03 LAB — CBC
HCT: 28.9 % — ABNORMAL LOW (ref 36.0–46.0)
HEMOGLOBIN: 9 g/dL — AB (ref 12.0–15.0)
MCH: 26.5 pg (ref 26.0–34.0)
MCHC: 31.1 g/dL (ref 30.0–36.0)
MCV: 85 fL (ref 78.0–100.0)
Platelets: 134 10*3/uL — ABNORMAL LOW (ref 150–400)
RBC: 3.4 MIL/uL — ABNORMAL LOW (ref 3.87–5.11)
RDW: 16.3 % — AB (ref 11.5–15.5)
WBC: 6.5 10*3/uL (ref 4.0–10.5)

## 2017-10-03 MED ORDER — FERROUS SULFATE 325 (65 FE) MG PO TABS: 325.0000 mg | ORAL_TABLET | Freq: Two times a day (BID) | ORAL | 0 refills | Status: AC

## 2017-10-03 NOTE — Progress Notes (Signed)
Blood transfusion completed at 0218.  Patient tolerated well.  No sign of transfusion reaction observed.  Will continue to monitor.

## 2017-10-03 NOTE — Discharge Summary (Addendum)
Physician Discharge Summary  TAGEN BRETHAUER HUD:[id number] DOB: 1940-11-17 DOA: 09/29/2017  PCP: Donald Prose, MD  Admit date: 09/29/2017 Discharge date: 10/03/2017  Admitted From:home Disposition:home  Recommendations for Outpatient Follow-up:  1. Follow up with PCP in 1-2 weeks 2. 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 In the ER patient was found to be hypotensive 75/43 and anemic (Hgb 4.4 down from baseline of 8.1). In the ER she was given IV PPI, 3L IVF, and started on 1st unit of pRBC.  Admitted by pulmonary critical care.  #Severe anemia due to upper GI bleed due to angiodysplastic lesions: -Patient received blood transfusion in the hospital.  She underwent upper GI endoscopy on 1/9 with multiple nonbleeding angiodysplastic lesion in the duodenum and jejunum.  It was treated with argon plasma coagulation.   -Patient with no further GI bleed.  Reported abdominal discomfort today.  Repeat chest x-ray done which was no acute finding.  Patient has good bowel sounds and nontender on exam.  Continue PPI.  Continue oral iron.  Recommended to follow-up with PCP and GI as an outpatient.  Discussed about soft diet and advance as tolerated. -Discussed with the GI team, recommended to resume aspirin on discharge since patient has history of coronary artery disease.  #Moderate obstructive sleep apnea on CPAP.  #History of coronary artery disease status post CABG: Currently  stable.    Resume home medication.  #Hemorrhagic shock due to GI bleed: Currently improved.  #Acute kidney injury: Serum creatinine level improved.  Monitor.  #Hypokalemia: Replete potassium chloride.  Improved  #Elevated lactic acid level likely due to hypotension.    Improved.  Patient is clinically improved.  Medically stable on discharge.  Recommend outpatient follow-up.  PT and OT recommended a skilled nursing home.  Patient and family refused SNF and wanted to take patient home.  I have discussed with the patient and her grandson at bedside today.  Discharge Diagnoses:  Active Problems:   Hemorrhagic shock (HCC)   Angiodysplasia of duodenum   Angiodysplasia of small intestine, except duodenum with bleeding   Hypotension due to hypovolemia   UGI bleed    Discharge Instructions  Discharge Instructions    Call MD for:  difficulty breathing, headache or visual disturbances   Complete by:  As directed    Call MD for:  extreme fatigue   Complete by:  As directed    Call MD for:  hives   Complete by:  As directed    Call MD for:  persistant dizziness or light-headedness   Complete by:  As directed    Call MD for:  persistant nausea and vomiting   Complete by:  As directed    Call MD for:  severe uncontrolled pain   Complete by:  As directed    Call MD for:  temperature >100.4   Complete by:  As directed    Diet - low sodium heart healthy   Complete by:  As directed    Increase activity slowly   Complete by:  As directed      Allergies as of 10/03/2017      Reactions   Codeine Other (See Comments)   Feeling intoxicated      Medication List    TAKE these medications   acetaminophen 325 MG tablet Commonly known as:  TYLENOL Take 2 tablets (650 mg total) by mouth every 6 (six) hours as needed for mild pain (or Fever >/= 101).   amLODipine 10 MG tablet Commonly known as:  NORVASC TAKE 1 TABLET BY MOUTH EVERY DAY   aspirin EC 81 MG tablet Take 81 mg by mouth  daily.   atorvastatin 80 MG tablet Commonly known as:  LIPITOR TAKE 1 TABLET BY MOUTH EVERY DAY   BYSTOLIC 5 MG tablet Generic drug:  nebivolol TAKE 1 TABLET BY MOUTH EVERY DAY   CALCIUM 600+D 600-400 MG-UNIT tablet Generic drug:  Calcium Carbonate-Vitamin D Take 1 tablet by mouth daily.   ferrous sulfate 325 (65 FE) MG tablet Take 1 tablet (325 mg total) by mouth 2 (two) times daily with a meal. What changed:  when to take this   furosemide 20 MG tablet Commonly known as:  LASIX Take 20 mg daily by mouth.   glycopyrrolate 2 MG tablet Commonly known as:  ROBINUL Take 2 mg by mouth 2 (two) times daily.   lisinopril-hydrochlorothiazide 20-25 MG tablet Commonly known as:  PRINZIDE,ZESTORETIC TAKE 1/2 TABLET BY MOUTH TWICE A DAY   LORazepam 0.5 MG tablet Commonly known as:  ATIVAN Take 0.5 mg by mouth at bedtime as needed for anxiety or sleep.   multivitamin tablet Take 1 tablet by mouth daily.   omeprazole 40 MG capsule Commonly known as:  PRILOSEC Take 1 capsule (40 mg total) by mouth 2 (two) times daily.   oxyCODONE-acetaminophen 5-325 MG tablet Commonly known as:  ROXICET Take 1-2 tablets by mouth every 4 (four) hours as needed. What changed:  reasons to take this   potassium chloride 10 MEQ CR capsule Commonly known as:  MICRO-K TAKE 2 CAPSULES BY MOUTH TWICE A DAY   sertraline 100 MG tablet Commonly known as:  ZOLOFT TAKE 2 TABLETS (200MG ) BY MOUTH ONCE A DAY   tiotropium 18 MCG inhalation capsule Commonly known as:  SPIRIVA HANDIHALER Place 1 capsule (18 mcg total) into inhaler and inhale every morning.      Follow-up Information    Donald Prose, MD. Schedule an appointment as soon as possible for a visit in 1 week(s).   Specialty:  Family Medicine Contact information: [place] [address] [phone]        Irene Shipper, MD. Schedule an appointment as soon as possible for a visit in 2 week(s).   Specialty:   Gastroenterology Contact information: [address] [place] [address] [phone]          Allergies  Allergen Reactions  . Codeine Other (See Comments)    Feeling intoxicated    Consultations: GI  Procedures/Studies: Endoscopy  Subjective: Seen and examined at bedside.  Doing well.  No nausea vomiting chest pain or shortness of breath.  Mild abdominal discomfort.  No GI bleed.   Discharge Exam: Vitals:   10/03/17 0821 10/03/17 0928  BP:  (!) 125/54  Pulse:  67  Resp:    Temp:    SpO2: 95% 97%   Vitals:   10/03/17 0238 10/03/17 0457 10/03/17 0821 10/03/17 0928  BP: (!) 119/57 (!) 127/59  (!) 125/54  Pulse: 63 65  67  Resp: 19 18    Temp: 98.5 F (36.9 C) 98.2 F (36.8 C)    TempSrc: Oral     SpO2: 100% 95% 95% 97%  Weight:  62.2 kg (137 lb 2 oz)    Height:        General: Pt is alert, awake, not in acute distress Cardiovascular: RRR, S1/S2 +, no rubs, no gallops Respiratory: CTA bilaterally, no wheezing, no rhonchi Abdominal: Soft, NT, ND, bowel sounds + Extremities: no edema, no cyanosis    The results of significant diagnostics from this hospitalization (including imaging, microbiology, ancillary and laboratory) are listed below for reference.     Microbiology: Recent Results (from the past 240 hour(s))  MRSA PCR Screening     Status: None   Collection Time: 09/30/17  3:42 AM  Result Value Ref Range Status   MRSA by PCR NEGATIVE NEGATIVE Final    Comment:        The GeneXpert MRSA Assay (FDA approved for NASAL specimens only), is one component of a comprehensive MRSA colonization surveillance program. It is not intended to diagnose MRSA infection nor to guide or monitor treatment for MRSA infections.      Labs: BNP (last 3 results) No results for input(s): BNP in the last 8760 hours. Basic Metabolic Panel: Recent Labs  Lab 09/29/17 2244 09/30/17 0602 10/01/17 0332 10/02/17 0306  NA 137 138 139 139  K 4.1 4.2 3.1* 3.9   CL 108 112* 111 111  CO2 16* 16* 19* 19*  GLUCOSE 132* 89 74 72  BUN 27* 26* 13 7  CREATININE 1.37* 1.10* 0.69 0.54  CALCIUM 8.4* 7.9* 8.5* 8.4*  MG  --  1.8  --   --   PHOS  --  4.6  --   --    Liver Function Tests: Recent Labs  Lab 09/29/17 2244  AST 67*  ALT 46  ALKPHOS 72  BILITOT 0.4  PROT 6.1*  ALBUMIN 2.9*   No results for input(s): LIPASE, AMYLASE in the last 168 hours. No results for input(s): AMMONIA in the last 168 hours. CBC: Recent Labs  Lab 09/29/17 2244 09/30/17 0602 09/30/17 1705 10/01/17 0332 10/02/17 0306 10/03/17 0225  WBC 6.5 8.7  --  7.5 6.1 6.5  NEUTROABS 4.2  --   --   --   --   --   HGB 4.4* 7.4* 7.4* 7.4* 7.1* 9.0*  HCT 15.3* 24.1* 24.3* 23.2* 23.1* 28.9*  MCV 84.5 86.7  --  86.2 86.5 85.0  PLT 207 189  --  176 138* 134*   Cardiac Enzymes: Recent Labs  Lab 09/30/17 1052  TROPONINI 0.06*   BNP: Invalid input(s): POCBNP CBG: Recent Labs  Lab 09/29/17 2347 09/30/17 0409 09/30/17 1953  GLUCAP 143* 129* 82   D-Dimer No results for input(s): DDIMER in the last 72 hours. Hgb A1c No results for input(s): HGBA1C in the last 72 hours. Lipid Profile No results for input(s): CHOL, HDL, LDLCALC, TRIG, CHOLHDL, LDLDIRECT in the last 72 hours. Thyroid function studies No results for input(s): TSH, T4TOTAL, T3FREE, THYROIDAB in the last 72 hours.  Invalid input(s): FREET3 Anemia work up Recent Labs    10/01/17 1355  FERRITIN 940*  TIBC 322  IRON 212*   Urinalysis    Component Value Date/Time   COLORURINE AMBER (A) 09/30/2017 0351   APPEARANCEUR CLOUDY (A) 09/30/2017 0351   LABSPEC 1.021 09/30/2017 0351   PHURINE 5.0 09/30/2017 [place] 09/30/2017 0351  HGBUR NEGATIVE 09/30/2017 0351   BILIRUBINUR NEGATIVE 09/30/2017 0351   KETONESUR NEGATIVE 09/30/2017 0351   PROTEINUR 30 (A) 09/30/2017 0351   UROBILINOGEN 0.2 12/27/2008 1056   NITRITE NEGATIVE 09/30/2017 0351   LEUKOCYTESUR TRACE (A) 09/30/2017 0351    Sepsis Labs Invalid input(s): PROCALCITONIN,  WBC,  LACTICIDVEN Microbiology Recent Results (from the past 240 hour(s))  MRSA PCR Screening     Status: None   Collection Time: 09/30/17  3:42 AM  Result Value Ref Range Status   MRSA by PCR NEGATIVE NEGATIVE Final    Comment:        The GeneXpert MRSA Assay (FDA approved for NASAL specimens only), is one component of a comprehensive MRSA colonization surveillance program. It is not intended to diagnose MRSA infection nor to guide or monitor treatment for MRSA infections.      Time coordinating discharge:31 minutes  SIGNED:   Rosita Fire, MD  [facility] 10/03/2017, 1:24 PM  If 7PM-7AM, please contact night-coverage [email or web address] Password [email or web address]

## 2017-10-03 NOTE — Progress Notes (Signed)
Discharge instructions reviewed with the patient/caregiver to include prescriptions, Follow up appointments, activity and medications.  Both voice understanding to teaching.  Printed copies provided. To door via wheelchair.  Home via [place] with caregiver driving.

## 2017-10-21 ENCOUNTER — Ambulatory Visit: Payer: Medicare Other | Admitting: Nurse Practitioner

## 2017-10-30 ENCOUNTER — Encounter: Payer: Self-pay | Admitting: Physician Assistant

## 2017-10-30 ENCOUNTER — Other Ambulatory Visit (INDEPENDENT_AMBULATORY_CARE_PROVIDER_SITE_OTHER): Payer: Medicare Other

## 2017-10-30 ENCOUNTER — Ambulatory Visit (INDEPENDENT_AMBULATORY_CARE_PROVIDER_SITE_OTHER): Payer: Medicare Other | Admitting: Physician Assistant

## 2017-10-30 VITALS — BP 118/78 | HR 74 | Ht <= 58 in | Wt 136.0 lb

## 2017-10-30 DIAGNOSIS — K5521 Angiodysplasia of colon with hemorrhage: Secondary | ICD-10-CM

## 2017-10-30 DIAGNOSIS — D500 Iron deficiency anemia secondary to blood loss (chronic): Secondary | ICD-10-CM

## 2017-10-30 DIAGNOSIS — K558 Other vascular disorders of intestine: Secondary | ICD-10-CM

## 2017-10-30 DIAGNOSIS — Z8379 Family history of other diseases of the digestive system: Secondary | ICD-10-CM | POA: Diagnosis not present

## 2017-10-30 DIAGNOSIS — Z8719 Personal history of other diseases of the digestive system: Secondary | ICD-10-CM

## 2017-10-30 LAB — CBC WITH DIFFERENTIAL/PLATELET
Basophils Absolute: 0 10*3/uL (ref 0.0–0.1)
Basophils Relative: 0.2 % (ref 0.0–3.0)
EOS ABS: 0.1 10*3/uL (ref 0.0–0.7)
Eosinophils Relative: 1.1 % (ref 0.0–5.0)
HEMATOCRIT: 35.2 % — AB (ref 36.0–46.0)
HEMOGLOBIN: 11.2 g/dL — AB (ref 12.0–15.0)
LYMPHS PCT: 14.9 % (ref 12.0–46.0)
Lymphs Abs: 1.1 10*3/uL (ref 0.7–4.0)
MCHC: 31.9 g/dL (ref 30.0–36.0)
MCV: 86.7 fl (ref 78.0–100.0)
MONOS PCT: 9 % (ref 3.0–12.0)
Monocytes Absolute: 0.7 10*3/uL (ref 0.1–1.0)
NEUTROS ABS: 5.7 10*3/uL (ref 1.4–7.7)
Neutrophils Relative %: 74.8 % (ref 43.0–77.0)
PLATELETS: 216 10*3/uL (ref 150.0–400.0)
RBC: 4.06 Mil/uL (ref 3.87–5.11)
RDW: 22 % — ABNORMAL HIGH (ref 11.5–15.5)
WBC: 7.6 10*3/uL (ref 4.0–10.5)

## 2017-10-30 NOTE — Progress Notes (Signed)
Subjective:    Patient ID: [name], female    DOB: 05-21-1941, 77 y.o.   MRN: [id number]  HPI [name] is a pleasant 77 year old African-American female, known to Dr. Ardis Hughs who comes in today for post hospital follow-up. She was admitted 183 10/02/2017 with severe anemia, associated with weakness and hypotension. She was found to have a hemoglobin of 4.4. She received 4 units of packed RBCs and hemoglobin was 9 on discharge. Patient underwent upper endoscopy with Dr. Henrene Pastor on 10/01/2016 which showed multiple duodenal and proximal jejunal AVMs all of which were APC'd. It was  suggested that if she continued to require blood transfusions that repeat colonoscopy and/or repeat capsule endoscopy should be done.  Patient had prior history of AVMs, had workup with equal GI prior to 2016. She had nonbleeding AVMs throughout the small bowel on capsule endoscopy in 2010. Colonoscopy in 2016 with poor prep and otherwise unremarkable exam. This was repeated in July 2018 per Dr. Ardis Hughs with APC of nonbleeding cecal AVM again fair prep. EGD in July 2018 with 2 diminutive nonbleeding gastric ulcers and nonbleeding duodenal AVM which was obliterated with APC. Patient has history of peripheral vascular disease, pulmonary artery hypertension, hypertension, and coronary artery disease status post CABG and aortic valve replacement. She is continued on baby aspirin and has been taking oral iron. Most recent iron studies did not show evidence of iron deficiency. Since discharge from the hospital she says her stool remains dark on iron, she has no complaints of abdominal discomfort. She continues to have some ongoing weakness and shortness of breath with exertion though she doesn't feel worsened since she was discharged from the hospital.  Review of Systems;Pertinent positive and negative review of systems were noted in the above HPI section.  All other review of systems was otherwise negative.  Outpatient  Encounter Medications as of 10/30/2017  Medication Sig  . acetaminophen (TYLENOL) 325 MG tablet Take 2 tablets (650 mg total) by mouth every 6 (six) hours as needed for mild pain (or Fever >/= 101).  Marland Kitchen amLODipine (NORVASC) 10 MG tablet TAKE 1 TABLET BY MOUTH EVERY DAY  . aspirin EC 81 MG tablet Take 81 mg by mouth daily.  Marland Kitchen atorvastatin (LIPITOR) 80 MG tablet TAKE 1 TABLET BY MOUTH EVERY DAY  . BYSTOLIC 5 MG tablet TAKE 1 TABLET BY MOUTH EVERY DAY  . Calcium Carbonate-Vitamin D (CALCIUM 600+D) 600-400 MG-UNIT per tablet Take 1 tablet by mouth daily.  . ferrous sulfate 325 (65 FE) MG tablet Take 1 tablet (325 mg total) by mouth 2 (two) times daily with a meal.  . furosemide (LASIX) 20 MG tablet Take 20 mg daily by mouth.   Marland Kitchen glycopyrrolate (ROBINUL) 2 MG tablet Take 2 mg by mouth 2 (two) times daily.  Marland Kitchen lisinopril-hydrochlorothiazide (PRINZIDE,ZESTORETIC) 20-25 MG tablet TAKE 1/2 TABLET BY MOUTH TWICE A DAY  . LORazepam (ATIVAN) 0.5 MG tablet Take 0.5 mg by mouth at bedtime as needed for anxiety or sleep.   . Multiple Vitamin (MULTIVITAMIN) tablet Take 1 tablet by mouth daily.  Marland Kitchen omeprazole (PRILOSEC) 40 MG capsule Take 1 capsule (40 mg total) by mouth 2 (two) times daily.  . potassium chloride (MICRO-K) 10 MEQ CR capsule TAKE 2 CAPSULES BY MOUTH TWICE A DAY  . sertraline (ZOLOFT) 100 MG tablet TAKE 2 TABLETS (200MG ) BY MOUTH ONCE A DAY  . tiotropium (SPIRIVA HANDIHALER) 18 MCG inhalation capsule Place 1 capsule (18 mcg total) into inhaler and inhale every morning.  . [  DISCONTINUED] oxyCODONE-acetaminophen (ROXICET) 5-325 MG tablet Take 1-2 tablets by mouth every 4 (four) hours as needed. (Patient not taking: Reported on 10/30/2017)   No facility-administered encounter medications on file as of 10/30/2017.    Allergies  Allergen Reactions  . Codeine Other (See Comments)    Feeling intoxicated   Patient Active Problem List   Diagnosis Date Noted  . Pain in the abdomen   . Angiodysplasia of  duodenum   . Angiodysplasia of small intestine, except duodenum with bleeding   . Hypotension due to hypovolemia   . UGI bleed   . Hemorrhagic shock ([place]) 09/30/2017  . 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 Symptomatic anemia 04/10/2017  . Generalized weakness   . Heme positive stool   . PAH (pulmonary artery hypertension) ([place])   . Epigastric burning sensation 04/19/2015  . Chronic diarrhea 04/19/2015  . History of GI bleed 03/14/2015  . Long-term use of high-risk medication 01/11/2015  . Dysphagia 01/11/2015  . S/P AVR 01/11/2015  . Dyspnea 01/11/2015  . Tobacco abuse 01/11/2015  . Numbness of anterior thigh-Left 12/09/2014  . Pain in joint, lower leg 12/09/2014  . Essential hypertension 07/14/2014  . Coronary artery disease involving coronary bypass graft of native heart without angina pectoris 07/14/2014  . Dilated aortic root ([place]) 07/14/2014  . H/O aortic valve replacement 07/14/2014  . Tobacco use 07/14/2014  . Obesity 07/14/2014  . Palpitations 01/03/2014  . Aortic valve disorder 06/19/2013  . Heart valve replaced by other means 06/19/2013  . Flatulence, eructation, and gas pain 06/19/2013  . Pure hypercholesterolemia 06/19/2013  . 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 Hypopotassemia 06/19/2013  . Anemia, unspecified 06/19/2013  . Anxiety state, unspecified 06/19/2013  . Esophageal reflux 06/19/2013  . Personal history of other diseases of digestive system 06/19/2013  . Iron deficiency anemia, unspecified 06/19/2013  . Proteinuria 06/19/2013  . Arthropathy, unspecified, site unspecified 06/19/2013  . Major depressive disorder, single episode, unspecified 06/19/2013  . 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 Tobacco use disorder 06/19/2013  . Peripheral vascular disease ([place]) 10/14/2012  . Atherosclerosis of aorta ([place]) 10/14/2012  . Pain in limb 10/14/2012  . Atherosclerosis of abdominal aorta ([place]) 10/16/2011  . PVD  (peripheral vascular disease) ([place]) 10/16/2011   Social History   Socioeconomic History  . Marital status: Single    Spouse name: Not on file  . Number of children: 3  . Years of education: Not on file  . Highest education level: Not on file  Social Needs  . Financial resource strain: Not on file  . Food insecurity - worry: Not on file  . Food insecurity - inability: Not on file  . Transportation needs - medical: Not on file  . Transportation needs - non-medical: Not on file  Occupational History  . Occupation: Retired  Tobacco Use  . Smoking status: Current Some Day Smoker    Packs/day: 1.00    Years: 52.00    Pack years: 52.00    Types: Cigarettes    Last attempt to quit: 01/21/2017    Years since quitting: 0.7  . Smokeless tobacco: Never Used  . Tobacco comment: "might borrow a cigarette from time to time"  Substance and Sexual Activity  . Alcohol use: No    Alcohol/week: 0.0 oz  . Drug use: No  . Sexual activity: Not Currently  Other Topics Concern  . Not on file  Social History Narrative  . Not on file  Ms. Weisner family history includes Diabetes in her brother; Hypertension in her brother, daughter, father, mother, and sister; Kidney disease in her mother.      Objective:    Vitals:   10/30/17 1404  BP: 118/78  Pulse: 74    Physical Exam ; well-developed elderly African-American female in no acute distress, she ambulates with a walker. Pleasant blood pressure 118/78 pulse 74, height 4 foot 9, weight 136, BMI 29.4. HEENT; nontraumatic normocephalic EOMI PERRLA sclera anicteric, Cardiovascular; regular rate and rhythm with X4-G8 loud systolic murmur rub or gallop pulmonary clear, Abdomen; soft, nontender nondistended bowel sounds are active there is no palpable mass or hepatosplenomegaly, Rectal; exam not done, Ext;no clubbing cyanosis or edema skin warm and dry, Neuropsych; mood and affect appropriate       Assessment & Plan:   #29 77 year old  African-American female who was seen today in post hospital follow-up after recent admission for profound anemia associated with weakness and hypotension. She underwent EGD with APC of multiple duodenal and proximal jejunal AVMs during hospitalization. Patient also has previous history of cecal AVM and multiple scattered small bowel AVMs noted on capsule endoscopy remotely in 2010. #2 coronary artery disease status post CABG #3 status post aortic valve replacement #4 hypertension #5 pulmonary artery hypertension #6 peripheral vascular disease #7 history of complicated diverticulosis status post rectosigmoid colectomy 2005 #8 status post right hip arthroplasty November 2018  Plan; Patient will continue oral iron supplementation Repeat CBC today. We discussed potential need for outpatient transfusions if hemoglobin drifts to 7 or below. We also discussed potential need for repeat capsule endoscopy if she continues to require transfusions over the next few months. She has known small bowel AVMs but could potentially be referred for deep enteroscopy if she continues to have transfusion requirement.  Keefe Zawistowski S Jeyren Danowski PA-C 10/30/2017   Cc: Donald Prose, MD

## 2017-10-30 NOTE — Patient Instructions (Signed)
Please go to the basement level to have your labs drawn.  

## 2017-10-31 NOTE — Progress Notes (Signed)
I agree with the above note, plan 

## 2017-11-04 ENCOUNTER — Ambulatory Visit (INDEPENDENT_AMBULATORY_CARE_PROVIDER_SITE_OTHER): Payer: Medicare Other | Admitting: Orthopaedic Surgery

## 2017-11-04 ENCOUNTER — Encounter (INDEPENDENT_AMBULATORY_CARE_PROVIDER_SITE_OTHER): Payer: Self-pay | Admitting: Orthopaedic Surgery

## 2017-11-04 ENCOUNTER — Other Ambulatory Visit: Payer: Self-pay

## 2017-11-04 DIAGNOSIS — D649 Anemia, unspecified: Secondary | ICD-10-CM

## 2017-11-04 DIAGNOSIS — Z96641 Presence of right artificial hip joint: Secondary | ICD-10-CM

## 2017-11-04 NOTE — Progress Notes (Signed)
[name] returns today for follow-up status post right total hip arthroplasty now 74 days postop.  States she is overall doing well.  Range of motion strength improving.  She gets better each day.  She states she has soreness around the incision site and some soreness lateral aspect of the hip.  No calf pain shortness of breath chest pain.  Physical exam: Right hip good range of motion without pain.  She is able to fire her flexors.  Calf supple nontender.  Dorsiflexion plantarflexion the ankle intact.  Surgical incision with slight keloid no signs of infection.  She has tenderness over the right trochanteric region.  Impression: Status post right total hip arthroplasty 74 days postop.  Trochanteric bursitis right hip  Plan: IT band stretching exercises shown.  And discussed with her she shown how to perform scar tissue mobilization.  We will see her back in 3 months to check her progress.  Questions encouraged and answered by Dr. Ninfa [place] and myself.

## 2017-11-17 ENCOUNTER — Telehealth: Payer: Self-pay

## 2017-11-17 NOTE — Telephone Encounter (Signed)
Left a message on the voicemail reminding her to come back in for repeat non-fasting labs.

## 2017-11-17 NOTE — Telephone Encounter (Signed)
-----   Message from Algernon Huxley, RN sent at 11/04/2017  3:21 PM EST ----- Regarding: CBC Pt needs labs, order in epic.

## 2017-11-19 ENCOUNTER — Other Ambulatory Visit (INDEPENDENT_AMBULATORY_CARE_PROVIDER_SITE_OTHER): Payer: Medicare Other

## 2017-11-19 DIAGNOSIS — D649 Anemia, unspecified: Secondary | ICD-10-CM

## 2017-11-19 LAB — CBC WITH DIFFERENTIAL/PLATELET
BASOS PCT: 0.3 % (ref 0.0–3.0)
Basophils Absolute: 0 10*3/uL (ref 0.0–0.1)
EOS ABS: 0 10*3/uL (ref 0.0–0.7)
EOS PCT: 0.6 % (ref 0.0–5.0)
HCT: 32.8 % — ABNORMAL LOW (ref 36.0–46.0)
HEMOGLOBIN: 10.6 g/dL — AB (ref 12.0–15.0)
Lymphocytes Relative: 23.6 % (ref 12.0–46.0)
Lymphs Abs: 2 10*3/uL (ref 0.7–4.0)
MCHC: 32.3 g/dL (ref 30.0–36.0)
MCV: 90.2 fl (ref 78.0–100.0)
MONO ABS: 0.8 10*3/uL (ref 0.1–1.0)
Monocytes Relative: 8.9 % (ref 3.0–12.0)
Neutro Abs: 5.7 10*3/uL (ref 1.4–7.7)
Neutrophils Relative %: 66.6 % (ref 43.0–77.0)
Platelets: 274 10*3/uL (ref 150.0–400.0)
RBC: 3.64 Mil/uL — AB (ref 3.87–5.11)
RDW: 21.6 % — ABNORMAL HIGH (ref 11.5–15.5)
WBC: 8.5 10*3/uL (ref 4.0–10.5)

## 2017-11-25 ENCOUNTER — Other Ambulatory Visit: Payer: Self-pay

## 2017-11-25 DIAGNOSIS — K922 Gastrointestinal hemorrhage, unspecified: Secondary | ICD-10-CM

## 2017-11-25 NOTE — Progress Notes (Signed)
c 

## 2017-12-18 ENCOUNTER — Other Ambulatory Visit: Payer: Self-pay

## 2017-12-18 ENCOUNTER — Other Ambulatory Visit (INDEPENDENT_AMBULATORY_CARE_PROVIDER_SITE_OTHER): Payer: Medicare Other

## 2017-12-18 DIAGNOSIS — K922 Gastrointestinal hemorrhage, unspecified: Secondary | ICD-10-CM | POA: Diagnosis not present

## 2017-12-18 LAB — CBC WITH DIFFERENTIAL/PLATELET
BASOS ABS: 0 10*3/uL (ref 0.0–0.1)
BASOS PCT: 0.2 % (ref 0.0–3.0)
EOS ABS: 0.1 10*3/uL (ref 0.0–0.7)
Eosinophils Relative: 1.2 % (ref 0.0–5.0)
HCT: 30.1 % — ABNORMAL LOW (ref 36.0–46.0)
HEMOGLOBIN: 9.6 g/dL — AB (ref 12.0–15.0)
LYMPHS PCT: 21.8 % (ref 12.0–46.0)
Lymphs Abs: 1.5 10*3/uL (ref 0.7–4.0)
MCHC: 31.8 g/dL (ref 30.0–36.0)
MCV: 91.5 fl (ref 78.0–100.0)
Monocytes Absolute: 0.5 10*3/uL (ref 0.1–1.0)
Monocytes Relative: 6.7 % (ref 3.0–12.0)
Neutro Abs: 4.8 10*3/uL (ref 1.4–7.7)
Neutrophils Relative %: 70.1 % (ref 43.0–77.0)
Platelets: 297 10*3/uL (ref 150.0–400.0)
RBC: 3.29 Mil/uL — AB (ref 3.87–5.11)
RDW: 15.7 % — ABNORMAL HIGH (ref 11.5–15.5)
WBC: 6.8 10*3/uL (ref 4.0–10.5)

## 2017-12-19 ENCOUNTER — Other Ambulatory Visit: Payer: Self-pay

## 2017-12-19 DIAGNOSIS — D649 Anemia, unspecified: Secondary | ICD-10-CM

## 2017-12-19 DIAGNOSIS — D509 Iron deficiency anemia, unspecified: Secondary | ICD-10-CM

## 2018-01-07 ENCOUNTER — Other Ambulatory Visit: Payer: Self-pay | Admitting: Acute Care

## 2018-01-07 DIAGNOSIS — Z122 Encounter for screening for malignant neoplasm of respiratory organs: Secondary | ICD-10-CM

## 2018-01-07 DIAGNOSIS — F17210 Nicotine dependence, cigarettes, uncomplicated: Principal | ICD-10-CM

## 2018-02-02 ENCOUNTER — Ambulatory Visit (INDEPENDENT_AMBULATORY_CARE_PROVIDER_SITE_OTHER): Payer: Medicare Other | Admitting: Orthopaedic Surgery

## 2018-02-02 ENCOUNTER — Other Ambulatory Visit (INDEPENDENT_AMBULATORY_CARE_PROVIDER_SITE_OTHER): Payer: Self-pay | Admitting: Orthopaedic Surgery

## 2018-02-02 ENCOUNTER — Encounter (INDEPENDENT_AMBULATORY_CARE_PROVIDER_SITE_OTHER): Payer: Self-pay | Admitting: Orthopaedic Surgery

## 2018-02-02 ENCOUNTER — Other Ambulatory Visit (INDEPENDENT_AMBULATORY_CARE_PROVIDER_SITE_OTHER): Payer: Self-pay

## 2018-02-02 DIAGNOSIS — R2243 Localized swelling, mass and lump, lower limb, bilateral: Secondary | ICD-10-CM

## 2018-02-02 DIAGNOSIS — Z96641 Presence of right artificial hip joint: Secondary | ICD-10-CM

## 2018-02-02 MED ORDER — ACETAMINOPHEN-CODEINE #3 300-30 MG PO TABS: 1.0000 | ORAL_TABLET | Freq: Three times a day (TID) | ORAL | 0 refills | Status: DC | PRN

## 2018-02-02 NOTE — Progress Notes (Signed)
HPI [name] returns today now 5 months status post right total hip arthroplasty.  She states that the right hip is getting stronger and increasing range of motion.  However she does feel like she is having increased calf pain bilaterally over the past 2 weeks.  She is also becoming more and more short of breath.  She states whenever she is lying dow at night she gets short of breath and has to sit up on the edge of the bed.  She has had no fevers or chills.  States that she try to get in with her primary care physician but was told that she could not be seen until September.  Physical exam: General well-developed well-nourished female is in no acute distress.  Ambulates with a cane.  Right hip good range of motion with internal and external rotation without significant discomfort.  Bilateral calves tenderness.  No pitting edema bilaterally.  Dorsiflexion plantarflexion bilateral ankles intact.  Impression: Status post right total hip arthroplasty  Plan: At this point time and due to her significant cardiac history recommend that she try to gain in the appointment with her cardiologist.  We will set her up for Doppler bilateral lower legs to rule out DVT due to the fact that she is more sedentary and having calf pain along with increased shortness of breath.  She will follow-up with [place] at 1 year postop at that time we will obtain AP pelvis lateral view of the right hip.  She will follow-up sooner if there is any questions or concerns.

## 2018-02-03 ENCOUNTER — Emergency Department (HOSPITAL_COMMUNITY)
Admission: EM | Admit: 2018-02-03 | Discharge: 2018-02-03 | Discharge status: Absent without leave | Payer: Medicare Other | Source: Home / Self Care

## 2018-02-03 ENCOUNTER — Telehealth (INDEPENDENT_AMBULATORY_CARE_PROVIDER_SITE_OTHER): Payer: Self-pay

## 2018-02-03 ENCOUNTER — Ambulatory Visit (HOSPITAL_COMMUNITY)
Admission: RE | Admit: 2018-02-03 | Discharge: 2018-02-03 | Disposition: A | Discharge status: Home / Self Care | Payer: Medicare Other | Source: Ambulatory Visit | Attending: Physician Assistant | Admitting: Physician Assistant

## 2018-02-03 DIAGNOSIS — R2243 Localized swelling, mass and lump, lower limb, bilateral: Secondary | ICD-10-CM | POA: Insufficient documentation

## 2018-02-03 NOTE — Telephone Encounter (Signed)
FYI-  Patient's DVT is negative per [facility].  Patient advised that she may return home.

## 2018-02-03 NOTE — Progress Notes (Signed)
Preliminary notes--Bilateral lower extremities venous duplex exam completed. Negative for DVT. Result called Dr. Karolee Ohs spoke with Ms April. Patient was instructed to go back home.    Hongying Otniel Hoe (RDMS RVT) 02/03/18 10:18 AM

## 2018-02-11 ENCOUNTER — Other Ambulatory Visit (INDEPENDENT_AMBULATORY_CARE_PROVIDER_SITE_OTHER): Payer: Medicare Other

## 2018-02-11 ENCOUNTER — Other Ambulatory Visit: Payer: Self-pay

## 2018-02-11 ENCOUNTER — Telehealth: Payer: Self-pay

## 2018-02-11 DIAGNOSIS — D649 Anemia, unspecified: Secondary | ICD-10-CM

## 2018-02-11 DIAGNOSIS — D500 Iron deficiency anemia secondary to blood loss (chronic): Secondary | ICD-10-CM

## 2018-02-11 LAB — CBC WITH DIFFERENTIAL/PLATELET
Basophils Absolute: 0 10*3/uL (ref 0.0–0.1)
Basophils Relative: 0.3 % (ref 0.0–3.0)
EOS PCT: 0.9 % (ref 0.0–5.0)
Eosinophils Absolute: 0.1 10*3/uL (ref 0.0–0.7)
Lymphocytes Relative: 14.2 % (ref 12.0–46.0)
Lymphs Abs: 0.9 10*3/uL (ref 0.7–4.0)
MCHC: 28.8 g/dL — AB (ref 30.0–36.0)
MCV: 81.8 fl (ref 78.0–100.0)
MONOS PCT: 7.9 % (ref 3.0–12.0)
Monocytes Absolute: 0.5 10*3/uL (ref 0.1–1.0)
Neutro Abs: 5 10*3/uL (ref 1.4–7.7)
Neutrophils Relative %: 76.7 % (ref 43.0–77.0)
Platelets: 169 10*3/uL (ref 150.0–400.0)
RBC: 2.49 Mil/uL — AB (ref 3.87–5.11)
RDW: 16.1 % — ABNORMAL HIGH (ref 11.5–15.5)
WBC: 6.5 10*3/uL (ref 4.0–10.5)

## 2018-02-11 NOTE — Telephone Encounter (Signed)
Left message to call on her home and mobile phones.

## 2018-02-11 NOTE — Telephone Encounter (Signed)
-----   Message from Alfredia Ferguson, PA-C sent at 02/11/2018 12:32 PM EDT ----- Please call pt  And let her know her hgb is very low again -now 5.9.  See how she feels , and find out if seeing any black stools or blood . I can see her in the office tomorrow - but if she is feeling bad  She needs set up for  blood transfusion x 2 ASAP  Like tomorrow or Friday ... Let me know after you speak to her!

## 2018-02-12 ENCOUNTER — Observation Stay (HOSPITAL_COMMUNITY)
Admission: EM | Admit: 2018-02-12 | Discharge: 2018-02-13 | Disposition: A | Discharge status: Home / Self Care | Payer: Medicare Other | Attending: Internal Medicine | Admitting: Internal Medicine

## 2018-02-12 ENCOUNTER — Encounter (HOSPITAL_COMMUNITY): Payer: Self-pay | Admitting: Emergency Medicine

## 2018-02-12 ENCOUNTER — Other Ambulatory Visit: Payer: Self-pay

## 2018-02-12 DIAGNOSIS — I739 Peripheral vascular disease, unspecified: Secondary | ICD-10-CM | POA: Diagnosis not present

## 2018-02-12 DIAGNOSIS — K219 Gastro-esophageal reflux disease without esophagitis: Secondary | ICD-10-CM | POA: Diagnosis present

## 2018-02-12 DIAGNOSIS — Z951 Presence of aortocoronary bypass graft: Secondary | ICD-10-CM | POA: Insufficient documentation

## 2018-02-12 DIAGNOSIS — D500 Iron deficiency anemia secondary to blood loss (chronic): Secondary | ICD-10-CM | POA: Diagnosis not present

## 2018-02-12 DIAGNOSIS — F329 Major depressive disorder, single episode, unspecified: Secondary | ICD-10-CM | POA: Diagnosis not present

## 2018-02-12 DIAGNOSIS — D649 Anemia, unspecified: Secondary | ICD-10-CM | POA: Diagnosis present

## 2018-02-12 DIAGNOSIS — D509 Iron deficiency anemia, unspecified: Secondary | ICD-10-CM | POA: Diagnosis present

## 2018-02-12 DIAGNOSIS — I25810 Atherosclerosis of coronary artery bypass graft(s) without angina pectoris: Secondary | ICD-10-CM | POA: Diagnosis present

## 2018-02-12 DIAGNOSIS — Z79899 Other long term (current) drug therapy: Secondary | ICD-10-CM | POA: Insufficient documentation

## 2018-02-12 DIAGNOSIS — Z9049 Acquired absence of other specified parts of digestive tract: Secondary | ICD-10-CM | POA: Diagnosis not present

## 2018-02-12 DIAGNOSIS — Z952 Presence of prosthetic heart valve: Secondary | ICD-10-CM | POA: Diagnosis not present

## 2018-02-12 DIAGNOSIS — E7800 Pure hypercholesterolemia, unspecified: Secondary | ICD-10-CM | POA: Diagnosis present

## 2018-02-12 DIAGNOSIS — I10 Essential (primary) hypertension: Secondary | ICD-10-CM | POA: Diagnosis present

## 2018-02-12 DIAGNOSIS — F17210 Nicotine dependence, cigarettes, uncomplicated: Secondary | ICD-10-CM | POA: Insufficient documentation

## 2018-02-12 DIAGNOSIS — I2510 Atherosclerotic heart disease of native coronary artery without angina pectoris: Secondary | ICD-10-CM | POA: Diagnosis not present

## 2018-02-12 DIAGNOSIS — I252 Old myocardial infarction: Secondary | ICD-10-CM | POA: Diagnosis not present

## 2018-02-12 DIAGNOSIS — F419 Anxiety disorder, unspecified: Secondary | ICD-10-CM | POA: Insufficient documentation

## 2018-02-12 DIAGNOSIS — Z7982 Long term (current) use of aspirin: Secondary | ICD-10-CM | POA: Diagnosis not present

## 2018-02-12 DIAGNOSIS — K31819 Angiodysplasia of stomach and duodenum without bleeding: Secondary | ICD-10-CM

## 2018-02-12 DIAGNOSIS — I2721 Secondary pulmonary arterial hypertension: Secondary | ICD-10-CM | POA: Diagnosis not present

## 2018-02-12 DIAGNOSIS — K922 Gastrointestinal hemorrhage, unspecified: Secondary | ICD-10-CM | POA: Diagnosis present

## 2018-02-12 DIAGNOSIS — R0789 Other chest pain: Secondary | ICD-10-CM | POA: Insufficient documentation

## 2018-02-12 LAB — I-STAT CHEM 8, ED
BUN: 16 mg/dL (ref 6–20)
CALCIUM ION: 1.16 mmol/L (ref 1.15–1.40)
CREATININE: 0.7 mg/dL (ref 0.44–1.00)
Chloride: 107 mmol/L (ref 101–111)
GLUCOSE: 97 mg/dL (ref 65–99)
HEMATOCRIT: 21 % — AB (ref 36.0–46.0)
HEMOGLOBIN: 7.1 g/dL — AB (ref 12.0–15.0)
Potassium: 3.6 mmol/L (ref 3.5–5.1)
Sodium: 143 mmol/L (ref 135–145)
TCO2: 21 mmol/L — AB (ref 22–32)

## 2018-02-12 LAB — COMPREHENSIVE METABOLIC PANEL
ALT: 14 U/L (ref 14–54)
ANION GAP: 13 (ref 5–15)
AST: 25 U/L (ref 15–41)
Albumin: 3.2 g/dL — ABNORMAL LOW (ref 3.5–5.0)
Alkaline Phosphatase: 75 U/L (ref 38–126)
BILIRUBIN TOTAL: 0.4 mg/dL (ref 0.3–1.2)
BUN: 20 mg/dL (ref 6–20)
CALCIUM: 9 mg/dL (ref 8.9–10.3)
CHLORIDE: 109 mmol/L (ref 101–111)
CO2: 20 mmol/L — AB (ref 22–32)
CREATININE: 0.75 mg/dL (ref 0.44–1.00)
GFR calc non Af Amer: >60 mL/min (ref >60)
Glucose, Bld: 101 mg/dL — ABNORMAL HIGH (ref 65–99)
POTASSIUM: 3.6 mmol/L (ref 3.5–5.1)
Sodium: 142 mmol/L (ref 135–145)
Total Protein: 7.1 g/dL (ref 6.5–8.1)

## 2018-02-12 LAB — CBC WITH DIFFERENTIAL/PLATELET
BASOS ABS: 0 10*3/uL (ref 0.0–0.1)
Basophils Relative: 0 %
EOS PCT: 1 %
Eosinophils Absolute: 0.1 10*3/uL (ref 0.0–0.7)
HCT: 21.8 % — ABNORMAL LOW (ref 36.0–46.0)
HEMOGLOBIN: 5.7 g/dL — AB (ref 12.0–15.0)
LYMPHS ABS: 1 10*3/uL (ref 0.7–4.0)
LYMPHS PCT: 19 %
MCH: 23 pg — ABNORMAL LOW (ref 26.0–34.0)
MCHC: 26.1 g/dL — ABNORMAL LOW (ref 30.0–36.0)
MCV: 87.9 fL (ref 78.0–100.0)
Monocytes Absolute: 0.6 10*3/uL (ref 0.1–1.0)
Monocytes Relative: 12 %
NEUTROS ABS: 3.6 10*3/uL (ref 1.7–7.7)
NEUTROS PCT: 68 %
PLATELETS: 165 10*3/uL (ref 150–400)
RBC: 2.48 MIL/uL — AB (ref 3.87–5.11)
RDW: 15.4 % (ref 11.5–15.5)
WBC: 5.3 10*3/uL (ref 4.0–10.5)

## 2018-02-12 LAB — PREPARE RBC (CROSSMATCH)

## 2018-02-12 LAB — MAGNESIUM: Magnesium: 1.5 mg/dL — ABNORMAL LOW (ref 1.7–2.4)

## 2018-02-12 LAB — TROPONIN I: Troponin I: <0.03 ng/mL (ref <0.03)

## 2018-02-12 LAB — POC OCCULT BLOOD, ED: Fecal Occult Bld: POSITIVE — AB

## 2018-02-12 MED ORDER — PANTOPRAZOLE SODIUM 40 MG IV SOLR
80.0000 mg | Freq: Once | INTRAVENOUS | Status: DC
  Filled 2018-02-12: qty 80

## 2018-02-12 MED ORDER — SODIUM CHLORIDE 0.9 % IV SOLN
8.0000 mg/h | INTRAVENOUS | Status: DC
  Administered 2018-02-12 – 2018-02-13 (×2): 8 mg/h via INTRAVENOUS
  Filled 2018-02-12 (×3): qty 80

## 2018-02-12 MED ORDER — ONDANSETRON HCL 4 MG/2ML IJ SOLN: 4.0000 mg | Freq: Four times a day (QID) | INTRAMUSCULAR | Status: DC | PRN

## 2018-02-12 MED ORDER — ALUM & MAG HYDROXIDE-SIMETH 200-200-20 MG/5ML PO SUSP
15.0000 mL | Freq: Once | ORAL | Status: AC
  Administered 2018-02-12: 15 mL via ORAL
  Filled 2018-02-12: qty 30

## 2018-02-12 MED ORDER — SODIUM CHLORIDE 0.9 % IV SOLN
10.0000 mL/h | Freq: Once | INTRAVENOUS | Status: AC
  Administered 2018-02-12: 10 mL/h via INTRAVENOUS

## 2018-02-12 MED ORDER — SODIUM CHLORIDE 0.9 % IV SOLN
INTRAVENOUS | Status: DC
  Administered 2018-02-12: 15:00:00 via INTRAVENOUS

## 2018-02-12 MED ORDER — LORAZEPAM 0.5 MG PO TABS: 0.5000 mg | ORAL_TABLET | Freq: Every evening | ORAL | Status: DC | PRN

## 2018-02-12 MED ORDER — SODIUM CHLORIDE 0.9 % IV SOLN: Freq: Once | INTRAVENOUS | Status: DC

## 2018-02-12 MED ORDER — NEBIVOLOL HCL 5 MG PO TABS
5.0000 mg | ORAL_TABLET | Freq: Every day | ORAL | Status: DC
  Administered 2018-02-13: 5 mg via ORAL
  Filled 2018-02-12 (×2): qty 1

## 2018-02-12 MED ORDER — ACETAMINOPHEN 650 MG RE SUPP: 650.0000 mg | Freq: Four times a day (QID) | RECTAL | Status: DC | PRN

## 2018-02-12 MED ORDER — FUROSEMIDE 20 MG PO TABS
20.0000 mg | ORAL_TABLET | Freq: Every day | ORAL | Status: DC
  Administered 2018-02-12 – 2018-02-13 (×2): 20 mg via ORAL
  Filled 2018-02-12 (×2): qty 1

## 2018-02-12 MED ORDER — ACETAMINOPHEN 325 MG PO TABS: 650.0000 mg | ORAL_TABLET | Freq: Four times a day (QID) | ORAL | Status: DC | PRN

## 2018-02-12 MED ORDER — TIOTROPIUM BROMIDE MONOHYDRATE 18 MCG IN CAPS
18.0000 ug | ORAL_CAPSULE | Freq: Every morning | RESPIRATORY_TRACT | Status: DC
  Administered 2018-02-13: 18 ug via RESPIRATORY_TRACT
  Filled 2018-02-12: qty 5

## 2018-02-12 MED ORDER — ACETAMINOPHEN-CODEINE #3 300-30 MG PO TABS
1.0000 | ORAL_TABLET | Freq: Three times a day (TID) | ORAL | Status: DC | PRN
  Administered 2018-02-12 – 2018-02-13 (×2): 2 via ORAL
  Filled 2018-02-12 (×2): qty 2

## 2018-02-12 MED ORDER — PANTOPRAZOLE SODIUM 40 MG IV SOLR: 40.0000 mg | Freq: Two times a day (BID) | INTRAVENOUS | Status: DC

## 2018-02-12 MED ORDER — ATORVASTATIN CALCIUM 40 MG PO TABS
80.0000 mg | ORAL_TABLET | Freq: Every day | ORAL | Status: DC
  Administered 2018-02-12 – 2018-02-13 (×2): 80 mg via ORAL
  Filled 2018-02-12 (×2): qty 2

## 2018-02-12 MED ORDER — SODIUM CHLORIDE 0.9 % IV BOLUS
500.0000 mL | Freq: Once | INTRAVENOUS | Status: AC
  Administered 2018-02-12: 500 mL via INTRAVENOUS

## 2018-02-12 MED ORDER — ONE-DAILY MULTI VITAMINS PO TABS: 1.0000 | ORAL_TABLET | Freq: Every day | ORAL | Status: DC

## 2018-02-12 MED ORDER — SODIUM CHLORIDE 0.9 % IV SOLN
80.0000 mg | Freq: Once | INTRAVENOUS | Status: AC
  Administered 2018-02-12: 80 mg via INTRAVENOUS
  Filled 2018-02-12: qty 80

## 2018-02-12 MED ORDER — ONDANSETRON HCL 4 MG PO TABS: 4.0000 mg | ORAL_TABLET | Freq: Four times a day (QID) | ORAL | Status: DC | PRN

## 2018-02-12 MED ORDER — ADULT MULTIVITAMIN W/MINERALS CH
1.0000 | ORAL_TABLET | Freq: Every day | ORAL | Status: DC
  Administered 2018-02-12 – 2018-02-13 (×2): 1 via ORAL
  Filled 2018-02-12 (×2): qty 1

## 2018-02-12 NOTE — H&P (Signed)
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 Patient is somewhat confused and accompanied by her daughter, who states that patient had hemoglobin a month ago and it was in the 9-10 range and she had blood work done on 5/23 and her hemoglobin dropped to 5.7. Patient has been symptomatic. She has been experiencing dyspnea on exertion and some chest pain.Patient has had leg weakness, and cramps with ambulation.she has a history of multiple duodenal and proximal jejunal AVMs.She is on iron supplementation at home.patient states that she takes an aspirin on a daily basis, does not take any other NSAIDs. She has not seen any gross hematochezia or melena  ED course BP 112/71   Pulse 65   Temp 97.6 F (36.4 C) (Oral)   Resp (!) 25   Ht 4\' 9"  (1.448 m)   Wt 61.2 kg (135 lb)   LMP  (LMP Unknown)   SpO2 100%   BMI 29.21 kg/m  repeat hemoglobin 7.1, FOBT positive EKG shows sinus atrial rhythm, initial troponin negative Janett Billow, PA-C with [facility] , consulted. Patient started on a Protonix infusion   Review of Systems: negative for the following  Constitutional: Positive for fatigue. Negative for chills, diaphoresis and fever.  HENT: Negative for congestion, rhinorrhea and sneezing.   Eyes: Negative.   Respiratory: Positive for shortness of breath. Negative for cough and chest tightness.   Cardiovascular: Positive for chest pain. Negative for leg swelling.  Gastrointestinal: Negative for abdominal pain, blood in stool, diarrhea, nausea and vomiting.  Genitourinary:  Negative for difficulty urinating, flank pain, frequency and hematuria.  Musculoskeletal: Negative for arthralgias and back pain.  Skin: Negative for rash.  Neurological: Positive for dizziness and light-headedness  Hematological: Denies adenopathy. Easy bruising, personal or family bleeding history  Psychiatric/Behavioral: Denies suicidal ideation, mood changes, confusion, nervousness, sleep disturbance and agitation       Past Medical History:  Diagnosis Date  . Abdominal pain, unspecified site   . Anemia    Iron deficiency anemia  . Anxiety   . Aortic stenosis   . Arthritis   . CAD (coronary artery disease)    psot bypass x2 to diagonal and obtuse marginal. SVG graft-01/08/2009  . Depression   . Dyspnea   . GERD (gastroesophageal reflux disease)   . History of cardiovascular stress test    Myoview 7/16:  Low risk, no ischemia or scar, EF 72%  . History of echocardiogram    Echo 7/16:  EF 55-60%, no RWMA, Gr 2 DD, AVR ok (mean 20 mmHg), PASP 40 mmHg  . History of GI bleed    With AVM  . Hypercholesterolemia   . Hyperlipidemia   . Hypertension   . Iron deficiency anemia   . Myocardial infarction ([place]) 2010  . Neuromuscular disorder (HCC)    numbness in hands  . Peripheral vascular disease ([place])    righ tleg  . Shoulder impingement syndrome    Right shoulder     Past Surgical History:  Procedure Laterality Date  . ABDOMINAL HYSTERECTOMY    . AORTIC VALVE  REPLACEMENT     [place] #[id number]  . CARDIAC CATHETERIZATION N/A 08/11/2015   Procedure: Right Heart Cath;  Surgeon: Jolaine Artist, MD;  Location: [place] [facility];  Service: Cardiovascular;  Laterality: N/A;  . CARDIAC SURGERY  2010   , 2 blockages  . CHOLECYSTECTOMY    . COLONOSCOPY N/A 04/12/2017   Procedure: COLONOSCOPY;  Surgeon: Manus Gunning, MD;  Location: [facility] ENDOSCOPY;  Service: Gastroenterology;  Laterality: N/A;  . CORONARY ARTERY BYPASS GRAFT    . ENTEROSCOPY N/A 10/01/2017   Procedure:  ENTEROSCOPY;  Surgeon: Irene Shipper, MD;  Location: [facility]COPY;  Service: Endoscopy;  Laterality: N/A;  . ESOPHAGOGASTRODUODENOSCOPY N/A 04/12/2017   Procedure: ESOPHAGOGASTRODUODENOSCOPY (EGD);  Surgeon: Manus Gunning, MD;  Location: [facility];  Service: Gastroenterology;  Laterality: N/A;  . HEEL SPUR EXCISION  2000   left heel  . SPINE SURGERY  2003   Herniated diskectomy  . TOTAL HIP ARTHROPLASTY Right 08/22/2017   Procedure: RIGHT TOTAL HIP ARTHROPLASTY ANTERIOR APPROACH;  Surgeon: Mcarthur Rossetti, MD;  Location: [facility];  Service: Orthopedics;  Laterality: Right;  . WRIST SURGERY     right wrist      Social History:  reports that she has been smoking cigarettes.  She has a 52.00 pack-year smoking history. She has never used smokeless tobacco. She reports that she does not drink alcohol or use drugs.    Allergies  Allergen Reactions  . Codeine Other (See Comments)    Feeling intoxicated    Family History  Problem Relation Age of Onset  . Hypertension Mother   . Kidney disease Mother   . Hypertension Father   . Hypertension Sister   . Hypertension Brother   . Hypertension Daughter   . Diabetes Brother   . Heart attack Neg Hx   . Stroke Neg Hx         Prior to Admission medications   Medication Sig Start Date End Date Taking? Authorizing Provider  acetaminophen (TYLENOL) 325 MG tablet Take 2 tablets (650 mg total) by mouth every 6 (six) hours as needed for mild pain (or Fever >/= 101). 04/12/17  Yes Rosita Fire, MD  acetaminophen-codeine (TYLENOL #3) 300-30 MG tablet Take 1-2 tablets by mouth every 8 (eight) hours as needed for moderate pain. 02/02/18  Yes Mcarthur Rossetti, MD  amLODipine (NORVASC) 10 MG tablet TAKE 1 TABLET BY MOUTH EVERY DAY 08/04/17  Yes Jerline Pain, MD  aspirin EC 81 MG tablet Take 81 mg by mouth daily.   Yes [provider]  atorvastatin (LIPITOR) 80 MG tablet TAKE 1 TABLET BY MOUTH EVERY DAY  08/06/17  Yes Jerline Pain, MD  BYSTOLIC 5 MG tablet TAKE 1 TABLET BY MOUTH EVERY DAY 06/30/17  Yes Jerline Pain, MD  Calcium Carbonate-Vitamin D (CALCIUM 600+D) 600-400 MG-UNIT per tablet Take 1 tablet by mouth daily.   Yes [provider]  ferrous sulfate 325 (65 FE) MG tablet Take 1 tablet (325 mg total) by mouth 2 (two) times daily with a meal. 10/03/17  Yes Rosita Fire, MD  furosemide (LASIX) 20 MG tablet Take 20 mg daily by mouth.    Yes [provider]  glycopyrrolate (ROBINUL) 2 MG tablet Take 2 mg by mouth 2 (two) times daily. 12/09/14  Yes [provider]  lisinopril-hydrochlorothiazide (PRINZIDE,ZESTORETIC) 20-25 MG tablet TAKE 1/2 TABLET BY MOUTH TWICE A DAY 08/18/17  Yes Jerline Pain, MD  LORazepam (ATIVAN) 0.5 MG tablet  Take 0.5 mg by mouth at bedtime as needed for anxiety or sleep.    Yes [provider]  Multiple Vitamin (MULTIVITAMIN) tablet Take 1 tablet by mouth daily.   Yes [provider]  omeprazole (PRILOSEC) 40 MG capsule Take 1 capsule (40 mg total) by mouth 2 (two) times daily. 04/12/17  Yes Rosita Fire, MD  potassium chloride (MICRO-K) 10 MEQ CR capsule TAKE 2 CAPSULES BY MOUTH TWICE A DAY 09/15/17  Yes Jerline Pain, MD  sertraline (ZOLOFT) 100 MG tablet TAKE 2 TABLETS (200MG ) BY MOUTH ONCE A DAY 10/30/15  Yes [provider]  tiotropium (SPIRIVA HANDIHALER) 18 MCG inhalation capsule Place 1 capsule (18 mcg total) into inhaler and inhale every morning. Patient not taking: Reported on 02/12/2018 04/09/17   Donita Brooks, NP     Physical Exam: Vitals:   02/12/18 1300 02/12/18 1339 02/12/18 1345 02/12/18 1400  BP: (!) 87/48 (!) 85/46 93/68 112/71  Pulse: 60 65 61 65  Resp: 16 (!) 26 (!) 22 (!) 25  Temp:      TempSrc:      SpO2: 98% 98% 98% 100%  Weight:      Height:            Vitals:   02/12/18 1300 02/12/18 1339 02/12/18 1345 02/12/18 1400  BP: (!) 87/48 (!) 85/46 93/68 112/71   Pulse: 60 65 61 65  Resp: 16 (!) 26 (!) 22 (!) 25  Temp:      TempSrc:      SpO2: 98% 98% 98% 100%  Weight:      Height:       Constitutional: NAD, calm, comfortable Eyes: PERRL, lids and conjunctivae normal ENMT: Mucous membranes are moist. Posterior pharynx clear of any exudate or lesions.Normal dentition.  Neck: normal, supple, no masses, no thyromegaly Respiratory: clear to auscultation bilaterally, no wheezing, no crackles. Normal respiratory effort. No accessory muscle use.  Cardiovascular: Regular rate and rhythm, no murmurs / rubs / gallops. No extremity edema. 2+ pedal pulses. reproducible chest pain Abdomen: no tenderness, no masses palpated. No hepatosplenomegaly. Bowel sounds positive.  Musculoskeletal: no clubbing / cyanosis. No joint deformity upper and lower extremities. Good ROM, no contractures. Normal muscle tone.  Skin: no rashes, lesions, ulcers. No induration Neurologic: CN 2-12 grossly intact. Sensation intact, DTR normal. Strength 5/5 in all 4.  Psychiatric: Normal judgment and insight. Alert and oriented x 3. Normal mood.     Labs on Admission: I have personally reviewed following labs and imaging studies  CBC: Recent Labs  Lab 02/11/18 1202 02/12/18 1244 02/12/18 1250  WBC 6.5 5.3  --   NEUTROABS 5.0 3.6  --   HGB 5.9 Repeated and verified X2.* 5.7* 7.1*  HCT 20.4 Repeated and verified X2.* 21.8* 21.0*  MCV 81.8 87.9  --   PLT 169.0 165  --     Basic Metabolic Panel: Recent Labs  Lab 02/12/18 1244 02/12/18 1250  NA 142 143  K 3.6 3.6  CL 109 107  CO2 20*  --   GLUCOSE 101* 97  BUN 20 16  CREATININE 0.75 0.70  CALCIUM 9.0  --     GFR: Estimated Creatinine Clearance: 45 mL/min (by C-G formula based on SCr of 0.7 mg/dL).  Liver Function Tests: Recent Labs  Lab 02/12/18 1244  AST 25  ALT 14  ALKPHOS 75  BILITOT 0.4  PROT 7.1  ALBUMIN 3.2*   No results for input(s): LIPASE, AMYLASE in the last 168  hours. No results for input(s):  AMMONIA in the last 168 hours.  Coagulation Profile: No results for input(s): INR, PROTIME in the last 168 hours. No results for input(s): DDIMER in the last 72 hours.  Cardiac Enzymes: Recent Labs  Lab 02/12/18 1244  TROPONINI <0.03    BNP (last 3 results) Recent Labs    04/08/17 0841  PROBNP 258    HbA1C: No results for input(s): HGBA1C in the last 72 hours. Lab Results  Component Value Date   HGBA1C  12/27/2008    5.6 (NOTE) The ADA recommends the following therapeutic goal for glycemic control related to Hgb A1c measurement: Goal of therapy: <6.5 Hgb A1c  Reference: American Diabetes Association: Clinical Practice Recommendations 2010, Diabetes Care, 2010, 33: (Suppl  1).     CBG: No results for input(s): GLUCAP in the last 168 hours.  Lipid Profile: No results for input(s): CHOL, HDL, LDLCALC, TRIG, CHOLHDL, LDLDIRECT in the last 72 hours.  Thyroid Function Tests: No results for input(s): TSH, T4TOTAL, FREET4, T3FREE, THYROIDAB in the last 72 hours.  Anemia Panel: No results for input(s): VITAMINB12, FOLATE, FERRITIN, TIBC, IRON, RETICCTPCT in the last 72 hours.  Urine analysis:    Component Value Date/Time   COLORURINE AMBER (A) 09/30/2017 0351   APPEARANCEUR CLOUDY (A) 09/30/2017 0351   LABSPEC 1.021 09/30/2017 0351   PHURINE 5.0 09/30/2017 0351   GLUCOSEU NEGATIVE 09/30/2017 0351   HGBUR NEGATIVE 09/30/2017 0351   BILIRUBINUR NEGATIVE 09/30/2017 0351   KETONESUR NEGATIVE 09/30/2017 0351   PROTEINUR 30 (A) 09/30/2017 0351   UROBILINOGEN 0.2 12/27/2008 1056   NITRITE NEGATIVE 09/30/2017 0351   LEUKOCYTESUR TRACE (A) 09/30/2017 0351    Sepsis Labs: @LABRCNTIP (procalcitonin:4,lacticidven:4) )No results found for this or any previous visit (from the past 240 hour(s)).       Radiological Exams on Admission: No results found. No results found.    EKG: Independently reviewed. 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 GI has been consulted Patient will be transfused with 2 units of packed red blood cells Clear liquids for now She has been started on a Protonix drip Transfuse hemoglobin greater than 8.0 given history of coronary artery disease  Chest pain, atypical History of CAD, status post CABG Patient's chest pain is reproducible on physical exam Continue to cycle cardiac enzymes Patient had a right heart cath in 2016 that showed pulmonary hypertension Will obtain echo to rule out wall motion abnormalities and to further evaluate shortness of breath which most likely is due to her anemia Continue by systolic    PVD (peripheral vascular disease) (HCC)history of coronary artery disease and CABG Aspirin on hold for now, resume if okay with GI    Pure hypercholesterolemia-continue statin    Esophageal reflux started on a Protonix drip    Essential hypertension-hold lisinopril/HCTZ, continue by systolic     H/O aortic valve replacement-she has a bioprosthetic valve and is followed by Dr. [name]. 2-D echo ordered and pending for shortness of breath    PAH (pulmonary artery hypertension) (HCC)-stable currently not on oxygen continue Spiriva     DVT prophylaxis: SCDs     Code Status Orders full code   (From admission, onward)          consults called:  Family Communication: Admission, patients condition and plan of care including tests being ordered have been discussed with the patient  who indicates understanding and agree with the plan and  Code Status   Admission status:  The appropriate patient status for this patient is INPATIENT. Inpatient status is judged to be reasonable and necessary in order to provide the required intensity of service to ensure the patient's safety. The patient's presenting symptoms, physical exam findings, and initial radiographic and laboratory data in the context of their chronic comorbidities is felt  to place them at high risk for further clinical deterioration. Furthermore, it is not anticipated that the patient will be medically stable for discharge from the hospital within 2 midnights of admission. The following factors support the patient status of inpatient.    "           The patient's presenting symptoms include low back pain. "           The worrisome physical exam findings include and inability to walk. "           The initial radiographic and laboratory data are worrisome because of sacral fracture on CT. "           The chronic co-morbidities include history of hypertension.     * I certify that at the point of admission it is my clinical judgment that the patient will require inpatient hospital care spanning beyond 2 midnights from the point of admission due to high intensity of service, high risk for further deterioration and high frequency of surveillance required.*    Disposition plan: Further plan will depend as patient's clinical course evolves and further radiologic and laboratory data become available. Likely home when stable    Reyne Dumas MD [facility] Pager [phone]  If 7PM-7AM, please contact night-coverage [email or web address] Password [facility]  02/12/2018, 2:54 PM

## 2018-02-12 NOTE — ED Notes (Signed)
Unsuccessful IV attempt x1. 

## 2018-02-12 NOTE — Consult Note (Addendum)
Referring Provider: Dr. Tamera Punt, [place] Primary Care Physician:  Donald Prose, MD Primary Gastroenterologist:  Dr. Ardis Hughs  Reason for Consultation:  Anemia and heme positive stools, history of AVM's  HPI: [name] is a 77 y.o. 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  She is known to Dr. Ardis Hughs for these issues and was last seen in our office in 10/2017 after she was admitted in January with severe anemia, associated with weakness and hypotension. She was found to have a hemoglobin of 4.4 grams. She received 4 units of packed RBCs and hemoglobin was 9 grams on discharge.  Patient underwent upper endoscopy with Dr. Henrene Pastor on 10/01/2016 which showed multiple duodenal and proximal jejunal AVMs all of which were APC'ed. It was suggested that if she continued to require blood transfusions that repeat colonoscopy and/or repeat capsule endoscopy should be done.  Patient had prior history of AVMs, had workup with [facility] prior to 2016. She had nonbleeding AVMs throughout the small bowel on capsule endoscopy in 2010. Colonoscopy in 2016 with poor prep and otherwise unremarkable exam. This was repeated in July 2018 per Dr. Ardis Hughs with APC of nonbleeding cecal AVM again fair prep. EGD in July 2018 with 2 diminutive nonbleeding gastric ulcers and nonbleeding duodenal AVM which was obliterated with APC.  She is on ASA 81 mg and PO iron supplements.  Plan after February visit was to remain on iron and monitor CBC but if she were to keep requiring transfusion then may need repeat capsule endoscopy and referral for deep/double balloon enteroscopy.  She is here today after having labs performed by our office yesterday and begin found to have a Hgb of 5.9 grams.  This is compared to 9.6 grams about 2 months ago.  Our office was not  able to get her set-up for outpatient transfusion so she was sent to the ED.  Says that she has been feeling more weak and fatigued over the past 2-3 weeks.  Does not see red blood in her stools and stools are black, but they are always black from the iron and do not look any different than previously.  She is heme positive.    Past Medical History:  Diagnosis Date  . Abdominal pain, unspecified site   . Anemia    Iron deficiency anemia  . Anxiety   . Aortic stenosis   . Arthritis   . CAD (coronary artery disease)    psot bypass x2 to diagonal and obtuse marginal. SVG graft-01/08/2009  . Depression   . Dyspnea   . GERD (gastroesophageal reflux disease)   . History of cardiovascular stress test    Myoview 7/16:  Low risk, no ischemia or scar, EF 72%  . History of echocardiogram    Echo 7/16:  EF 55-60%, no RWMA, Gr 2 DD, AVR ok (mean 20 mmHg), PASP 40 mmHg  . History of GI bleed    With AVM  . Hypercholesterolemia   . Hyperlipidemia   . Hypertension   . Iron deficiency anemia   . Myocardial infarction ([place]) 2010  . Neuromuscular disorder (H[facility])    numbness in hands  . Peripheral vascular disease ([place])    righ tleg  . Shoulder impingement syndrome    Right shoulder    Past Surgical History:  Procedure Laterality Date  .  ABDOMINAL HYSTERECTOMY    . AORTIC VALVE REPLACEMENT     [facility] #[id number]  . CARDIAC CATHETERIZATION N/A 08/11/2015   Procedure: Right Heart Cath;  Surgeon: Jolaine Artist, MD;  Location: [facility];  Service: Cardiovascular;  Laterality: N/A;  . CARDIAC SURGERY  2010   , 2 blockages  . CHOLECYSTECTOMY    . COLONOSCOPY N/A 04/12/2017   Procedure: COLONOSCOPY;  Surgeon: Manus Gunning, MD;  Location: [facility] ENDOSCOPY;  Service: Gastroenterology;  Laterality: N/A;  . CORONARY ARTERY BYPASS GRAFT    . ENTEROSCOPY N/A 10/01/2017   Procedure: ENTEROSCOPY;  Surgeon: Irene Shipper, MD;  Location: [facility] ENDOSCOPY;  Service: Endoscopy;  Laterality: N/A;  .  ESOPHAGOGASTRODUODENOSCOPY N/A 04/12/2017   Procedure: ESOPHAGOGASTRODUODENOSCOPY (EGD);  Surgeon: Manus Gunning, MD;  Location: [facility];  Service: Gastroenterology;  Laterality: N/A;  . HEEL SPUR EXCISION  2000   left heel  . SPINE SURGERY  2003   Herniated diskectomy  . TOTAL HIP ARTHROPLASTY Right 08/22/2017   Procedure: RIGHT TOTAL HIP ARTHROPLASTY ANTERIOR APPROACH;  Surgeon: Mcarthur Rossetti, MD;  Location: [facility];  Service: Orthopedics;  Laterality: Right;  . WRIST SURGERY     right wrist    Prior to Admission medications   Medication Sig Start Date End Date Taking? Authorizing Provider  acetaminophen (TYLENOL) 325 MG tablet Take 2 tablets (650 mg total) by mouth every 6 (six) hours as needed for mild pain (or Fever >/= 101). 04/12/17  Yes Rosita Fire, MD  acetaminophen-codeine (TYLENOL #3) 300-30 MG tablet Take 1-2 tablets by mouth every 8 (eight) hours as needed for moderate pain. 02/02/18  Yes Mcarthur Rossetti, MD  amLODipine (NORVASC) 10 MG tablet TAKE 1 TABLET BY MOUTH EVERY DAY 08/04/17  Yes Jerline Pain, MD  aspirin EC 81 MG tablet Take 81 mg by mouth daily.   Yes [provider]  atorvastatin (LIPITOR) 80 MG tablet TAKE 1 TABLET BY MOUTH EVERY DAY 08/06/17  Yes Jerline Pain, MD  BYSTOLIC 5 MG tablet TAKE 1 TABLET BY MOUTH EVERY DAY 06/30/17  Yes Jerline Pain, MD  Calcium Carbonate-Vitamin D (CALCIUM 600+D) 600-400 MG-UNIT per tablet Take 1 tablet by mouth daily.   Yes [provider]  ferrous sulfate 325 (65 FE) MG tablet Take 1 tablet (325 mg total) by mouth 2 (two) times daily with a meal. 10/03/17  Yes Rosita Fire, MD  furosemide (LASIX) 20 MG tablet Take 20 mg daily by mouth.    Yes [provider]  glycopyrrolate (ROBINUL) 2 MG tablet Take 2 mg by mouth 2 (two) times daily. 12/09/14  Yes [provider]  lisinopril-hydrochlorothiazide (PRINZIDE,ZESTORETIC) 20-25 MG tablet TAKE 1/2 TABLET  BY MOUTH TWICE A DAY 08/18/17  Yes Jerline Pain, MD  LORazepam (ATIVAN) 0.5 MG tablet Take 0.5 mg by mouth at bedtime as needed for anxiety or sleep.    Yes [provider]  Multiple Vitamin (MULTIVITAMIN) tablet Take 1 tablet by mouth daily.   Yes [provider]  omeprazole (PRILOSEC) 40 MG capsule Take 1 capsule (40 mg total) by mouth 2 (two) times daily. 04/12/17  Yes Rosita Fire, MD  potassium chloride (MICRO-K) 10 MEQ CR capsule TAKE 2 CAPSULES BY MOUTH TWICE A DAY 09/15/17  Yes Jerline Pain, MD  sertraline (ZOLOFT) 100 MG tablet TAKE 2 TABLETS (200MG ) BY MOUTH ONCE A DAY 10/30/15  Yes [provider]  tiotropium (SPIRIVA HANDIHALER) 18 MCG inhalation capsule  Place 1 capsule (18 mcg total) into inhaler and inhale every morning. Patient not taking: Reported on 02/12/2018 04/09/17   Donita Brooks, NP    Current Facility-Administered Medications  Medication Dose Route Frequency Provider Last Rate Last Dose  . 0.9 %  sodium chloride infusion  10 mL/hr Intravenous Once Belfi, Threasa Beards, MD      . 0.9 %  sodium chloride infusion   Intravenous Continuous Reyne Dumas, MD      . acetaminophen (TYLENOL) tablet 650 mg  650 mg Oral Q6H PRN Reyne Dumas, MD       Or  . acetaminophen (TYLENOL) suppository 650 mg  650 mg Rectal Q6H PRN Reyne Dumas, MD      . alum & mag hydroxide-simeth (MAALOX/MYLANTA) 200-200-20 MG/5ML suspension 15 mL  15 mL Oral Once Malvin Johns, MD      . ondansetron (ZOFRAN) tablet 4 mg  4 mg Oral Q6H PRN Reyne Dumas, MD       Or  . ondansetron (ZOFRAN) injection 4 mg  4 mg Intravenous Q6H PRN Abrol, Ascencion Dike, MD      . pantoprazole (PROTONIX) 80 mg in sodium chloride 0.9 % 100 mL IVPB  80 mg Intravenous Once Malvin Johns, MD       Current Outpatient Medications  Medication Sig Dispense Refill  . acetaminophen (TYLENOL) 325 MG tablet Take 2 tablets (650 mg total) by mouth every 6 (six) hours as needed for mild pain (or Fever >/=  101).    Marland Kitchen acetaminophen-codeine (TYLENOL #3) 300-30 MG tablet Take 1-2 tablets by mouth every 8 (eight) hours as needed for moderate pain. 50 tablet 0  . amLODipine (NORVASC) 10 MG tablet TAKE 1 TABLET BY MOUTH EVERY DAY 30 tablet 11  . aspirin EC 81 MG tablet Take 81 mg by mouth daily.    Marland Kitchen atorvastatin (LIPITOR) 80 MG tablet TAKE 1 TABLET BY MOUTH EVERY DAY 30 tablet 12  . BYSTOLIC 5 MG tablet TAKE 1 TABLET BY MOUTH EVERY DAY 90 tablet 2  . Calcium Carbonate-Vitamin D (CALCIUM 600+D) 600-400 MG-UNIT per tablet Take 1 tablet by mouth daily.    . ferrous sulfate 325 (65 FE) MG tablet Take 1 tablet (325 mg total) by mouth 2 (two) times daily with a meal. 60 tablet 0  . furosemide (LASIX) 20 MG tablet Take 20 mg daily by mouth.     Marland Kitchen glycopyrrolate (ROBINUL) 2 MG tablet Take 2 mg by mouth 2 (two) times daily.  1  . lisinopril-hydrochlorothiazide (PRINZIDE,ZESTORETIC) 20-25 MG tablet TAKE 1/2 TABLET BY MOUTH TWICE A DAY 90 tablet 2  . LORazepam (ATIVAN) 0.5 MG tablet Take 0.5 mg by mouth at bedtime as needed for anxiety or sleep.     . Multiple Vitamin (MULTIVITAMIN) tablet Take 1 tablet by mouth daily.    Marland Kitchen omeprazole (PRILOSEC) 40 MG capsule Take 1 capsule (40 mg total) by mouth 2 (two) times daily. 60 capsule 0  . potassium chloride (MICRO-K) 10 MEQ CR capsule TAKE 2 CAPSULES BY MOUTH TWICE A DAY 120 capsule 9  . sertraline (ZOLOFT) 100 MG tablet TAKE 2 TABLETS (200MG ) BY MOUTH ONCE A DAY  1  . tiotropium (SPIRIVA HANDIHALER) 18 MCG inhalation capsule Place 1 capsule (18 mcg total) into inhaler and inhale every morning. (Patient not taking: Reported on 02/12/2018) 30 capsule 12    Allergies as of 02/12/2018 - Review Complete 02/12/2018  Allergen Reaction Noted  . Codeine Other (See Comments) 10/14/2011    Family History  Problem Relation Age of Onset  . Hypertension Mother   . Kidney disease Mother   . Hypertension Father   . Hypertension Sister   . Hypertension Brother   .  Hypertension Daughter   . Diabetes Brother   . Heart attack Neg Hx   . Stroke Neg Hx     Social History   Socioeconomic History  . Marital status: Single    Spouse name: Not on file  . Number of children: 3  . Years of education: Not on file  . Highest education level: Not on file  Occupational History  . Occupation: Retired  Scientific laboratory technician  . Financial resource strain: Not on file  . Food insecurity:    Worry: Not on file    Inability: Not on file  . Transportation needs:    Medical: Not on file    Non-medical: Not on file  Tobacco Use  . Smoking status: Current Some Day Smoker    Packs/day: 1.00    Years: 52.00    Pack years: 52.00    Types: Cigarettes    Last attempt to quit: 01/21/2017    Years since quitting: 1.0  . Smokeless tobacco: Never Used  . Tobacco comment: "might borrow a cigarette from time to time"  Substance and Sexual Activity  . Alcohol use: No    Alcohol/week: 0.0 oz  . Drug use: No  . Sexual activity: Not Currently  Lifestyle  . Physical activity:    Days per week: Not on file    Minutes per session: Not on file  . Stress: Not on file  Relationships  . Social connections:    Talks on phone: Not on file    Gets together: Not on file    Attends religious service: Not on file    Active member of club or organization: Not on file    Attends meetings of clubs or organizations: Not on file    Relationship status: Not on file  . Intimate partner violence:    Fear of current or ex partner: Not on file    Emotionally abused: Not on file    Physically abused: Not on file    Forced sexual activity: Not on file  Other Topics Concern  . Not on file  Social History Narrative  . Not on file    Review of Systems: ROS is O/W negative except as mentioned in HPI.  Physical Exam: Vital signs in last 24 hours: Temp:  [97.6 F (36.4 C)] 97.6 F (36.4 C) (05/23 1227) Pulse Rate:  [60-73] 63 (05/23 1455) Resp:  [16-26] 22 (05/23 1455) BP:  (83-112)/(45-71) 83/51 (05/23 1455) SpO2:  [97 %-100 %] 98 % (05/23 1455) Weight:  [135 lb (61.2 kg)] 135 lb (61.2 kg) (05/23 1227)   General:  Alert, Well-developed, well-nourished, pleasant and cooperative in NAD Head:  Normocephalic and atraumatic. Eyes:  Sclera clear, no icterus.  Conjunctiva pale. Ears:  Normal auditory acuity. Mouth:  No deformity or lesions.   Lungs:  Clear throughout to auscultation.  No wheezes, crackles, or rhonchi.  Heart:  Regular rate and rhythm.  Valve snap noted. Abdomen:  Soft, non-distended.  BS present.  Non-tender. Rectal:  Deferred.  Was heme positive.  Msk:  Symmetrical without gross deformities. Pulses:  Normal pulses noted. Extremities:  Without clubbing or edema. Neurologic:  Alert and oriented x 4;  grossly normal neurologically. Skin:  Intact without significant lesions or rashes. Psych:  Alert and cooperative. Normal mood and affect.  Lab Results: Recent Labs    02/11/18 1202 02/12/18 1244 02/12/18 1250  WBC 6.5 5.3  --   HGB 5.9 Repeated and verified X2.* 5.7* 7.1*  HCT 20.4 Repeated and verified X2.* 21.8* 21.0*  PLT 169.0 165  --    BMET Recent Labs    02/12/18 1244 02/12/18 1250  NA 142 143  K 3.6 3.6  CL 109 107  CO2 20*  --   GLUCOSE 101* 97  BUN 20 16  CREATININE 0.75 0.70  CALCIUM 9.0  --    LFT Recent Labs    02/12/18 1244  PROT 7.1  ALBUMIN 3.2*  AST 25  ALT 14  ALKPHOS 75  BILITOT 0.4   IMPRESSION:  77 year old African-American female with recurrent profound anemia associated with weakness and hypotension.  Has a history of small bowel AVM's.  Most recently she underwent EGD with APC of multiple duodenal and proximal jejunal AVMs during hospitalization in 09/2017. Patient also has previous history of cecal AVM and multiple scattered small bowel AVMs noted on capsule endoscopy remotely in 2010.  Is on PO iron supplements so stools always dark.  No red blood.  She is heme positive. #2 coronary artery  disease status post CABG, only on ASA 81 mg daily #3 status post aortic valve replacement #4 hypertension #5 pulmonary artery hypertension #6 peripheral vascular disease #7 history of complicated diverticulosis status post rectosigmoid colectomy 2005 #8 status post right hip arthroplasty November 2018  *Agree with transfusion and monitor Hgb. *Will allow heart healthy diet. *I think that she can likely just be set up for repeat capsule endoscopy as an outpatient and then possible referral for double balloon/deep enteroscopy.  Do no think that there is any need to repeat inpatient procedures at this point.  If Hgb stable in AM then likely can go home.   Laban Emperor. Zehr  02/12/2018, 3:12 PM   Attending physician's note   I have taken an interval history, reviewed the chart and examined the patient. I agree with the Advanced Practitioner's note, impression and recommendations.   77 year old with profound anemia with history of small bowel AVMs.  Here for blood transfusion for hemoglobin 5.7, status post 2 units of PRBC to hemoglobin 8.6.  Had EGD with APC treatment of multiple duodenal and proximal jejunal AVMs I/2019, status post colonoscopy with history of cecal AVM ablation.  Plan: Repeat capsule endoscopy as an outpatient.  If there are any more AVMs within the reach of push enteroscope, then proceed with push enteroscopy.  If not, would recommend double balloon enteroscopy at [facility].  Trend CBC as an outpatient.  Avoid nonsteroidals.  Carmell Austria, MD

## 2018-02-12 NOTE — ED Triage Notes (Signed)
Patient BIB daughter, reports she had routine lab draw yesterday and called today to come for evaluation of abnormal hemoglobin. Hx anemia and blood transfusion. Denies seeing blood in urine or stool. Endorses generalized weakness x "a few days." Hypotensive in triage.

## 2018-02-12 NOTE — ED Notes (Signed)
Date and time results received: 02/12/18 1:41 PM (use smartphrase ".now" to insert current time)  Test: CBC Critical Value: Hgb 5.7  Name of Provider Notified: Belfi  Orders Received? Or Actions Taken?: MD notified

## 2018-02-12 NOTE — ED Notes (Signed)
Hospitalist at bedside 

## 2018-02-12 NOTE — Telephone Encounter (Signed)
Ms Rosamond called back. She says yes she is having black stools but she tells me it's because of the iron she takes. She does feel tired all the time and has no energy at all. I told her she might need blood transfusions and the nurse would call her back with dates. She will do what is recommended she tells me.

## 2018-02-12 NOTE — ED Notes (Signed)
Bed: BM21 Expected date:  Expected time:  Means of arrival:  Comments: A

## 2018-02-12 NOTE — ED Notes (Signed)
Transport called to transport patient.

## 2018-02-12 NOTE — ED Provider Notes (Addendum)
[facility] DEPT Provider Note   CSN: [id number] Arrival date & time: 02/12/18  1218     History   Chief Complaint Chief Complaint  Patient presents with  . Abnormal Lab  . Hypotension    HPI [name] is a 77 y.o. female.  Patient is a 77 year old female who presents with an abnormal blood value.  She had her blood drawn yesterday at her PCPs office which showed a hemoglobin of 5.9.  She does say that she has been feeling fatigued over the last week or so with some lightheadedness, primarily on standing.  She states when she lies down she feels better.  She does report some intermittent shortness of breath and also some intermittent chest pain.  She currently describes a tightness to the center of her chest.  She denies any abdominal pain.  No nausea or vomiting.  She denies any known blood in her stool.  She does report black stools but she states this that they are always black due to her iron supplementation.  She was admitted in January for similar episode of anemia with hypotension and received 4 units of packed red cells.  She was seen by [facility] gastroenterology and had an upper endoscopy which showed multiple AVMs.  This was per chart review.     Past Medical History:  Diagnosis Date  . Abdominal pain, unspecified site   . Anemia    Iron deficiency anemia  . Anxiety   . Aortic stenosis   . Arthritis   . CAD (coronary artery disease)    psot bypass x2 to diagonal and obtuse marginal. SVG graft-01/08/2009  . Depression   . Dyspnea   . GERD (gastroesophageal reflux disease)   . History of cardiovascular stress test    Myoview 7/16:  Low risk, no ischemia or scar, EF 72%  . History of echocardiogram    Echo 7/16:  EF 55-60%, no RWMA, Gr 2 DD, AVR ok (mean 20 mmHg), PASP 40 mmHg  . History of GI bleed    With AVM  . Hypercholesterolemia   . Hyperlipidemia   . Hypertension   . Iron deficiency anemia   . Myocardial infarction  ([place]) 2010  . Neuromuscular disorder (HCC)    numbness in hands  . Peripheral vascular disease ([place])    righ tleg  . Shoulder impingement syndrome    Right shoulder    Patient Active Problem List   Diagnosis Date Noted  . Pain in the abdomen   . Angiodysplasia of duodenum   . Angiodysplasia of small intestine, except duodenum with bleeding   . Hypotension due to hypovolemia   . UGI bleed   . Hemorrhagic shock ([place]) 09/30/2017  . 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 Symptomatic anemia 04/10/2017  . Generalized weakness   . Heme positive stool   . 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 Epigastric burning sensation 04/19/2015  . Chronic diarrhea 04/19/2015  . History of GI bleed 03/14/2015  . Long-term use of high-risk medication 01/11/2015  . Dysphagia 01/11/2015  . S/P AVR 01/11/2015  . Dyspnea 01/11/2015  . Tobacco abuse 01/11/2015  . Numbness of anterior thigh-Left 12/09/2014  . Pain in joint, lower leg 12/09/2014  . Essential hypertension 07/14/2014  . Coronary artery disease involving coronary bypass graft of native heart without angina pectoris 07/14/2014  . Dilated aortic root ([place]) 07/14/2014  . H/O aortic valve replacement 07/14/2014  . Tobacco use 07/14/2014  .  Obesity 07/14/2014  . Palpitations 01/03/2014  . Aortic valve disorder 06/19/2013  . Heart valve replaced by other means 06/19/2013  . Flatulence, eructation, and gas pain 06/19/2013  . Pure hypercholesterolemia 06/19/2013  . 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 Hypopotassemia 06/19/2013  . Anemia, unspecified 06/19/2013  . Anxiety state, unspecified 06/19/2013  . Esophageal reflux 06/19/2013  . Personal history of other diseases of digestive system 06/19/2013  . Iron deficiency anemia, unspecified 06/19/2013  . Proteinuria 06/19/2013  . Arthropathy, unspecified, site unspecified 06/19/2013  . Major depressive disorder, single episode, unspecified  06/19/2013  . 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 Tobacco use disorder 06/19/2013  . Peripheral vascular disease ([place]) 10/14/2012  . Atherosclerosis of aorta ([place]) 10/14/2012  . Pain in limb 10/14/2012  . Atherosclerosis of abdominal aorta ([place]) 10/16/2011  . PVD (peripheral vascular disease) ([place]) 10/16/2011    Past Surgical History:  Procedure Laterality Date  . ABDOMINAL HYSTERECTOMY    . AORTIC VALVE REPLACEMENT     [place] #21  . CARDIAC CATHETERIZATION N/A 08/11/2015   Procedure: Right Heart Cath;  Surgeon: Jolaine Artist, MD;  Location: [facility];  Service: Cardiovascular;  Laterality: N/A;  . CARDIAC SURGERY  2010   , 2 blockages  . CHOLECYSTECTOMY    . COLONOSCOPY N/A 04/12/2017   Procedure: COLONOSCOPY;  Surgeon: Manus Gunning, MD;  Location: [facility] ENDOSCOPY;  Service: Gastroenterology;  Laterality: N/A;  . CORONARY ARTERY BYPASS GRAFT    . ENTEROSCOPY N/A 10/01/2017   Procedure: ENTEROSCOPY;  Surgeon: Irene Shipper, MD;  Location: [facility] ENDOSCOPY;  Service: Endoscopy;  Laterality: N/A;  . ESOPHAGOGASTRODUODENOSCOPY N/A 04/12/2017   Procedure: ESOPHAGOGASTRODUODENOSCOPY (EGD);  Surgeon: Manus Gunning, MD;  Location: [facility];  Service: Gastroenterology;  Laterality: N/A;  . HEEL SPUR EXCISION  2000   left heel  . SPINE SURGERY  2003   Herniated diskectomy  . TOTAL HIP ARTHROPLASTY Right 08/22/2017   Procedure: RIGHT TOTAL HIP ARTHROPLASTY ANTERIOR APPROACH;  Surgeon: Mcarthur Rossetti, MD;  Location: [facility];  Service: Orthopedics;  Laterality: Right;  . WRIST SURGERY     right wrist     OB History   None      Home Medications    Prior to Admission medications   Medication Sig Start Date End Date Taking? Authorizing Provider  acetaminophen (TYLENOL) 325 MG tablet Take 2 tablets (650 mg total) by mouth every 6 (six) hours as needed for mild pain (or Fever >/= 101). 04/12/17  Yes Rosita Fire, MD  acetaminophen-codeine  (TYLENOL #3) 300-30 MG tablet Take 1-2 tablets by mouth every 8 (eight) hours as needed for moderate pain. 02/02/18  Yes Mcarthur Rossetti, MD  amLODipine (NORVASC) 10 MG tablet TAKE 1 TABLET BY MOUTH EVERY DAY 08/04/17  Yes Jerline Pain, MD  aspirin EC 81 MG tablet Take 81 mg by mouth daily.   Yes [provider]  atorvastatin (LIPITOR) 80 MG tablet TAKE 1 TABLET BY MOUTH EVERY DAY 08/06/17  Yes Jerline Pain, MD  BYSTOLIC 5 MG tablet TAKE 1 TABLET BY MOUTH EVERY DAY 06/30/17  Yes Jerline Pain, MD  Calcium Carbonate-Vitamin D (CALCIUM 600+D) 600-400 MG-UNIT per tablet Take 1 tablet by mouth daily.   Yes [provider]  ferrous sulfate 325 (65 FE) MG tablet Take 1 tablet (325 mg total) by mouth 2 (two) times daily with a meal. 10/03/17  Yes Rosita Fire, MD  furosemide (LASIX) 20 MG  tablet Take 20 mg daily by mouth.    Yes [provider]  glycopyrrolate (ROBINUL) 2 MG tablet Take 2 mg by mouth 2 (two) times daily. 12/09/14  Yes [provider]  lisinopril-hydrochlorothiazide (PRINZIDE,ZESTORETIC) 20-25 MG tablet TAKE 1/2 TABLET BY MOUTH TWICE A DAY 08/18/17  Yes Jerline Pain, MD  LORazepam (ATIVAN) 0.5 MG tablet Take 0.5 mg by mouth at bedtime as needed for anxiety or sleep.    Yes [provider]  Multiple Vitamin (MULTIVITAMIN) tablet Take 1 tablet by mouth daily.   Yes [provider]  omeprazole (PRILOSEC) 40 MG capsule Take 1 capsule (40 mg total) by mouth 2 (two) times daily. 04/12/17  Yes Rosita Fire, MD  potassium chloride (MICRO-K) 10 MEQ CR capsule TAKE 2 CAPSULES BY MOUTH TWICE A DAY 09/15/17  Yes Jerline Pain, MD  sertraline (ZOLOFT) 100 MG tablet TAKE 2 TABLETS (200MG ) BY MOUTH ONCE A DAY 10/30/15  Yes [provider]  tiotropium (SPIRIVA HANDIHALER) 18 MCG inhalation capsule Place 1 capsule (18 mcg total) into inhaler and inhale every morning. Patient not taking: Reported on 02/12/2018 04/09/17    Donita Brooks, NP    Family History Family History  Problem Relation Age of Onset  . Hypertension Mother   . Kidney disease Mother   . Hypertension Father   . Hypertension Sister   . Hypertension Brother   . Hypertension Daughter   . Diabetes Brother   . Heart attack Neg Hx   . Stroke Neg Hx     Social History Social History   Tobacco Use  . Smoking status: Current Some Day Smoker    Packs/day: 1.00    Years: 52.00    Pack years: 52.00    Types: Cigarettes    Last attempt to quit: 01/21/2017    Years since quitting: 1.0  . Smokeless tobacco: Never Used  . Tobacco comment: "might borrow a cigarette from time to time"  Substance Use Topics  . Alcohol use: No    Alcohol/week: 0.0 oz  . Drug use: No     Allergies   Codeine   Review of Systems Review of Systems  Constitutional: Positive for fatigue. Negative for chills, diaphoresis and fever.  HENT: Negative for congestion, rhinorrhea and sneezing.   Eyes: Negative.   Respiratory: Positive for shortness of breath. Negative for cough and chest tightness.   Cardiovascular: Positive for chest pain. Negative for leg swelling.  Gastrointestinal: Negative for abdominal pain, blood in stool, diarrhea, nausea and vomiting.  Genitourinary: Negative for difficulty urinating, flank pain, frequency and hematuria.  Musculoskeletal: Negative for arthralgias and back pain.  Skin: Negative for rash.  Neurological: Positive for dizziness and light-headedness. Negative for speech difficulty, weakness, numbness and headaches.     Physical Exam Updated Vital Signs BP 112/71   Pulse 65   Temp 97.6 F (36.4 C) (Oral)   Resp (!) 25   Ht 4\' 9"  (1.448 m)   Wt 61.2 kg (135 lb)   LMP  (LMP Unknown)   SpO2 100%   BMI 29.21 kg/m   Physical Exam  Constitutional: She is oriented to person, place, and time. She appears well-developed and well-nourished.  HENT:  Head: Normocephalic and atraumatic.  Eyes: Pupils are equal, round,  and reactive to light.  Neck: Normal range of motion. Neck supple.  Cardiovascular: Normal rate, regular rhythm and normal heart sounds.  Pulmonary/Chest: Effort normal and breath sounds normal. No respiratory distress. She has no wheezes.  She has no rales. She exhibits tenderness.  Abdominal: Soft. Bowel sounds are normal. There is no tenderness. There is no rebound and no guarding.  Genitourinary:  Genitourinary Comments: Rectal exam shows dark-colored stool with out gross blood.  It is Hemoccult positive.  Musculoskeletal: Normal range of motion. She exhibits no edema.  Lymphadenopathy:    She has no cervical adenopathy.  Neurological: She is alert and oriented to person, place, and time.  Skin: Skin is warm and dry. No rash noted.  Psychiatric: She has a normal mood and affect.     ED Treatments / Results  Labs (all labs ordered are listed, but only abnormal results are displayed) Labs Reviewed  CBC WITH DIFFERENTIAL/PLATELET - Abnormal; Notable for the following components:      Result Value   RBC 2.48 (*)    Hemoglobin 5.7 (*)    HCT 21.8 (*)    MCH 23.0 (*)    MCHC 26.1 (*)    All other components within normal limits  COMPREHENSIVE METABOLIC PANEL - Abnormal; Notable for the following components:   CO2 20 (*)    Glucose, Bld 101 (*)    Albumin 3.2 (*)    All other components within normal limits  I-STAT CHEM 8, ED - Abnormal; Notable for the following components:   TCO2 21 (*)    Hemoglobin 7.1 (*)    HCT 21.0 (*)    All other components within normal limits  POC OCCULT BLOOD, ED - Abnormal; Notable for the following components:   Fecal Occult Bld POSITIVE (*)    All other components within normal limits  TROPONIN I  MAGNESIUM  TROPONIN I  TROPONIN I  TROPONIN I  TYPE AND SCREEN  PREPARE RBC (CROSSMATCH)    EKG EKG Interpretation  Date/Time:  Thursday Feb 12 2018 12:44:26 EDT Ventricular Rate:  61 PR Interval:    QRS Duration: 81 QT Interval:  506 QTC  Calculation: 510 R Axis:   33 Text Interpretation:  Sinus or ectopic atrial rhythm Short PR interval Borderline T abnormalities, anterior leads Prolonged QT interval No STEMI.  Confirmed by Nanda Quinton [phone]) on 02/12/2018 12:48:46 PM   Radiology No results found.  Procedures Procedures (including critical care time)  Medications Ordered in ED Medications  0.9 %  sodium chloride infusion (has no administration in time range)  alum & mag hydroxide-simeth (MAALOX/MYLANTA) 200-200-20 MG/5ML suspension 15 mL (has no administration in time range)  0.9 %  sodium chloride infusion (has no administration in time range)  acetaminophen (TYLENOL) tablet 650 mg (has no administration in time range)    Or  acetaminophen (TYLENOL) suppository 650 mg (has no administration in time range)  ondansetron (ZOFRAN) tablet 4 mg (has no administration in time range)    Or  ondansetron (ZOFRAN) injection 4 mg (has no administration in time range)  sodium chloride 0.9 % bolus 500 mL (500 mLs Intravenous New Bag/Given 02/12/18 1344)     Initial Impression / Assessment and Plan / ED Course  I have reviewed the triage vital signs and the nursing notes.  Pertinent labs & imaging results that were available during my care of the patient were reviewed by me and considered in my medical decision making (see chart for details).     Patient is a 77 year old female who presents with profound anemia and hypotension.  Her hypotension has responded to a small bolus of IV fluids.  Her EKG does not show any ischemic changes.  She does have  some ongoing chest pain which is reproducible on palpation.  She also reports history of reflux.  We will try a dose of Maalox.  Her initial troponin is negative.  Her hemoglobin is markedly low and she has been typed and crossed for 2 units of packed red cells.  Her he Hemoccult is positive but her rectal exam shows no gross blood.  I will consult hospitalist for admission.  I spoke  with Dr. Allyson Sabal who will admit the pt.  I also discussed with patient with Janett Billow, PA-C with [facility] who will see the pt. She was given IV protonix.  CRITICAL CARE Performed by: Malvin Johns Total critical care time: 45 minutes Critical care time was exclusive of separately billable procedures and treating other patients. Critical care was necessary to treat or prevent imminent or life-threatening deterioration. Critical care was time spent personally by me on the following activities: development of treatment plan with patient and/or surrogate as well as nursing, discussions with consultants, evaluation of patient's response to treatment, examination of patient, obtaining history from patient or surrogate, ordering and performing treatments and interventions, ordering and review of laboratory studies, ordering and review of radiographic studies, pulse oximetry and re-evaluation of patient's condition.   Final Clinical Impressions(s) / ED Diagnoses   Final diagnoses:  Gastrointestinal hemorrhage, unspecified gastrointestinal hemorrhage type    ED Discharge Orders    None       Malvin Johns, MD 02/12/18 1451    Malvin Johns, MD 02/12/18 1458

## 2018-02-12 NOTE — ED Notes (Signed)
ED TO INPATIENT HANDOFF REPORT  Name/Age/Gender [name] 77 y.o. female  Code Status    Code Status Orders  (From admission, onward)        Start     Ordered   02/12/18 1450  Full code  Continuous     02/12/18 1451    Code Status History    Date Active Date Inactive Code Status Order ID Comments User Context   09/30/2017 0115 10/03/2017 1753 Full Code [id number]  Corey Harold, NP ED   08/22/2017 1751 08/25/2017 1607 Full Code [id number]  Mcarthur Rossetti, MD Inpatient   04/10/2017 1242 04/12/2017 2128 Full Code [id number]  Dionne Milo, NP ED   08/11/2015 1252 08/11/2015 1900 Full Code [id number]  Bensimhon, Shaune Pascal, MD Inpatient      Home/SNF/Other Home  Chief Complaint pt just stated "Blood"  Level of Care/Admitting Diagnosis ED Disposition    ED Disposition Condition [facility] Area: [facility] [[id number]]  Level of Care: Telemetry [5]  Admit to tele based on following criteria: Complex arrhythmia (Bradycardia/Tachycardia)  Diagnosis: Symptomatic anemia [2831517]  Admitting Physician: Reyne Dumas [[id number]]  Attending Physician: Reyne Dumas [[id number]]  Estimated length of stay: past midnight tomorrow  Certification:: I certify this patient will need inpatient services for at least 2 midnights  PT Class (Do Not Modify): Inpatient [101]  PT Acc Code (Do Not Modify): Private [1]       Medical History Past Medical History:  Diagnosis Date  . Abdominal pain, unspecified site   . Anemia    Iron deficiency anemia  . Anxiety   . Aortic stenosis   . Arthritis   . CAD (coronary artery disease)    psot bypass x2 to diagonal and obtuse marginal. SVG graft-01/08/2009  . Depression   . Dyspnea   . GERD (gastroesophageal reflux disease)   . History of cardiovascular stress test    Myoview 7/16:  Low risk, no ischemia or scar, EF 72%  . History of echocardiogram    Echo 7/16:  EF 55-60%, no RWMA, Gr 2 DD, AVR ok (mean  20 mmHg), PASP 40 mmHg  . History of GI bleed    With AVM  . Hypercholesterolemia   . Hyperlipidemia   . Hypertension   . Iron deficiency anemia   . Myocardial infarction ([place]) 2010  . Neuromuscular disorder (HCC)    numbness in hands  . Peripheral vascular disease ([place])    righ tleg  . Shoulder impingement syndrome    Right shoulder    Allergies Allergies  Allergen Reactions  . Codeine Other (See Comments)    Feeling intoxicated    IV Location/Drains/Wounds Patient Lines/Drains/Airways Status   Active Line/Drains/Airways    Name:   Placement date:   Placement time:   Site:   Days:   Peripheral IV 02/12/18 Left Antecubital   02/12/18    1242    Antecubital   less than 1          Labs/Imaging Results for orders placed or performed during the hospital encounter of 02/12/18 (from the past 48 hour(s))  Type and screen [place]     Status: None (Preliminary result)   Collection Time: 02/12/18 12:43 PM  Result Value Ref Range   ABO/RH(D) O POS    Antibody Screen NEG    Sample Expiration 02/15/2018    Unit Number [id number]    Blood Component Type RED CELLS,LR  Unit division 00    Status of Unit ALLOCATED    Transfusion Status OK TO TRANSFUSE    Crossmatch Result COMPATIBLE    Donor AG Type NEGATIVE FOR E ANTIGEN    Unit Number [id number]    Blood Component Type RED CELLS,LR    Unit division 00    Status of Unit ALLOCATED    Transfusion Status OK TO TRANSFUSE    Crossmatch Result COMPATIBLE    Donor AG Type NEGATIVE FOR E ANTIGEN   CBC with Differential     Status: Abnormal   Collection Time: 02/12/18 12:44 PM  Result Value Ref Range   WBC 5.3 4.0 - 10.5 K/uL   RBC 2.48 (L) 3.87 - 5.11 MIL/uL   Hemoglobin 5.7 (LL) 12.0 - 15.0 g/dL    Comment: CRITICAL RESULT CALLED TO, READ BACK BY AND VERIFIED WITH: QUICK, M RN _0  ON 5.23.19 BY NMCCOY    HCT 21.8 (L) 36.0 - 46.0 %   MCV 87.9 78.0 - 100.0 fL   MCH 23.0 (L) 26.0 - 34.0 pg    MCHC 26.1 (L) 30.0 - 36.0 g/dL   RDW 15.4 11.5 - 15.5 %   Platelets 165 150 - 400 K/uL   Neutrophils Relative % 68 %   Neutro Abs 3.6 1.7 - 7.7 K/uL   Lymphocytes Relative 19 %   Lymphs Abs 1.0 0.7 - 4.0 K/uL   Monocytes Relative 12 %   Monocytes Absolute 0.6 0.1 - 1.0 K/uL   Eosinophils Relative 1 %   Eosinophils Absolute 0.1 0.0 - 0.7 K/uL   Basophils Relative 0 %   Basophils Absolute 0.0 0.0 - 0.1 K/uL    Comment: Performed at [facility], [place] [address], [place], [place] [address]  Comprehensive metabolic panel     Status: Abnormal   Collection Time: 02/12/18 12:44 PM  Result Value Ref Range   Sodium 142 135 - 145 mmol/L   Potassium 3.6 3.5 - 5.1 mmol/L   Chloride 109 101 - 111 mmol/L   CO2 20 (L) 22 - 32 mmol/L   Glucose, Bld 101 (H) 65 - 99 mg/dL   BUN 20 6 - 20 mg/dL   Creatinine, Ser 0.75 0.44 - 1.00 mg/dL   Calcium 9.0 8.9 - 10.3 mg/dL   Total Protein 7.1 6.5 - 8.1 g/dL   Albumin 3.2 (L) 3.5 - 5.0 g/dL   AST 25 15 - 41 U/L   ALT 14 14 - 54 U/L   Alkaline Phosphatase 75 38 - 126 U/L   Total Bilirubin 0.4 0.3 - 1.2 mg/dL   GFR calc non Af Amer >60 >60 mL/min   GFR calc Af Amer >60 >60 mL/min    Comment: (NOTE) The eGFR has been calculated using the CKD EPI equation. This calculation has not been validated in all clinical situations. eGFR's persistently <60 mL/min signify possible Chronic Kidney Disease.    Anion gap 13 5 - 15    Comment: Performed at [facility], [place] [address] [place], [place] [address]  Troponin I     Status: None   Collection Time: 02/12/18 12:44 PM  Result Value Ref Range   Troponin I <0.03 <0.03 ng/mL    Comment: Performed at [facility], [place] [address], [place], [place] [address]  I-stat Chem 8, ED     Status: Abnormal   Collection Time: 02/12/18 12:50 PM  Result Value Ref Range   Sodium 143 135 - 145 mmol/L  Potassium 3.6 3.5 - 5.1 mmol/L   Chloride 107 101 - 111 mmol/L    BUN 16 6 - 20 mg/dL   Creatinine, Ser 0.70 0.44 - 1.00 mg/dL   Glucose, Bld 97 65 - 99 mg/dL   Calcium, Ion 1.16 1.15 - 1.40 mmol/L   TCO2 21 (L) 22 - 32 mmol/L   Hemoglobin 7.1 (L) 12.0 - 15.0 g/dL   HCT 21.0 (L) 36.0 - 46.0 %  Prepare RBC     Status: None   Collection Time: 02/12/18  1:42 PM  Result Value Ref Range   Order Confirmation      ORDER PROCESSED BY BLOOD BANK Performed at [facility] [phone], [place], [place] [address]   POC occult blood, ED Provider will collect     Status: Abnormal   Collection Time: 02/12/18  1:48 PM  Result Value Ref Range   Fecal Occult Bld POSITIVE (A) NEGATIVE   No results found.  Pending Labs Unresulted Labs (From admission, onward)   Start     Ordered   02/13/18 0500  CBC  Tomorrow morning,   STAT     02/12/18 1447   02/13/18 0500  Occult blood card to lab, stool  Daily,   R     02/12/18 1451   02/12/18 1451  Magnesium  Add-on,   R     02/12/18 1451   02/12/18 1451  Troponin I  Now then every 6 hours,   R     02/12/18 1451      Vitals/Pain Today's Vitals   02/12/18 1345 02/12/18 1400 02/12/18 1455 02/12/18 1520  BP: 93/68 112/71 (!) 83/51 (!) 102/56  Pulse: 61 65 63 (!) 57  Resp: (!) 22 (!) 25 (!) 22 (!) 26  Temp:      TempSrc:      SpO2: 98% 100% 98% 100%  Weight:      Height:      PainSc:        Isolation Precautions No active isolations  Medications Medications  0.9 %  sodium chloride infusion (has no administration in time range)  0.9 %  sodium chloride infusion ( Intravenous New Bag/Given 02/12/18 1516)  acetaminophen (TYLENOL) tablet 650 mg (has no administration in time range)    Or  acetaminophen (TYLENOL) suppository 650 mg (has no administration in time range)  ondansetron (ZOFRAN) tablet 4 mg (has no administration in time range)    Or  ondansetron (ZOFRAN) injection 4 mg (has no administration in time range)  pantoprazole (PROTONIX) 80 mg in sodium chloride 0.9 % 100 mL IVPB  (has no administration in time range)  sodium chloride 0.9 % bolus 500 mL (0 mLs Intravenous Stopped 02/12/18 1508)  alum & mag hydroxide-simeth (MAALOX/MYLANTA) 200-200-20 MG/5ML suspension 15 mL (15 mLs Oral Given 02/12/18 1515)    Mobility walks with device

## 2018-02-12 NOTE — Telephone Encounter (Signed)
[facility] cannot transfuse the patient due to policy. Also no openings there or at [name]. Patient instructed to go to the [facility] long ER per Nicoletta Ba, PA-C. Alonza Bogus, PA-C notified also.

## 2018-02-13 ENCOUNTER — Other Ambulatory Visit (HOSPITAL_COMMUNITY): Payer: Medicare Other

## 2018-02-13 ENCOUNTER — Other Ambulatory Visit: Payer: Self-pay

## 2018-02-13 ENCOUNTER — Telehealth: Payer: Self-pay

## 2018-02-13 DIAGNOSIS — I25810 Atherosclerosis of coronary artery bypass graft(s) without angina pectoris: Secondary | ICD-10-CM

## 2018-02-13 DIAGNOSIS — K922 Gastrointestinal hemorrhage, unspecified: Secondary | ICD-10-CM

## 2018-02-13 DIAGNOSIS — K219 Gastro-esophageal reflux disease without esophagitis: Secondary | ICD-10-CM | POA: Diagnosis not present

## 2018-02-13 DIAGNOSIS — D649 Anemia, unspecified: Secondary | ICD-10-CM | POA: Diagnosis not present

## 2018-02-13 DIAGNOSIS — Z952 Presence of prosthetic heart valve: Secondary | ICD-10-CM

## 2018-02-13 DIAGNOSIS — D500 Iron deficiency anemia secondary to blood loss (chronic): Secondary | ICD-10-CM

## 2018-02-13 LAB — CBC
HCT: 29.3 % — ABNORMAL LOW (ref 36.0–46.0)
Hemoglobin: 8.6 g/dL — ABNORMAL LOW (ref 12.0–15.0)
MCH: 25.1 pg — ABNORMAL LOW (ref 26.0–34.0)
MCHC: 29.4 g/dL — ABNORMAL LOW (ref 30.0–36.0)
MCV: 85.7 fL (ref 78.0–100.0)
PLATELETS: 115 10*3/uL — AB (ref 150–400)
RBC: 3.42 MIL/uL — ABNORMAL LOW (ref 3.87–5.11)
RDW: 14.8 % (ref 11.5–15.5)
WBC: 6.8 10*3/uL (ref 4.0–10.5)

## 2018-02-13 LAB — TYPE AND SCREEN
ABO/RH(D): O POS
ANTIBODY SCREEN: NEGATIVE
DONOR AG TYPE: NEGATIVE
Donor AG Type: NEGATIVE
UNIT DIVISION: 0
Unit division: 0

## 2018-02-13 LAB — BPAM RBC
BLOOD PRODUCT EXPIRATION DATE: 201906202359
BLOOD PRODUCT EXPIRATION DATE: 201906202359
ISSUE DATE / TIME: 201905231837
ISSUE DATE / TIME: 201905231616
UNIT TYPE AND RH: 5100
UNIT TYPE AND RH: 5100

## 2018-02-13 LAB — TROPONIN I: Troponin I: 0.03 ng/mL (ref <0.03)

## 2018-02-13 MED ORDER — ONDANSETRON HCL 4 MG PO TABS: 4.0000 mg | ORAL_TABLET | Freq: Four times a day (QID) | ORAL | 0 refills | Status: DC | PRN

## 2018-02-13 NOTE — Telephone Encounter (Signed)
Per Alonza Bogus, PA-C the patient is to come in for repeat CBC next week. Also schedule her for a capsule endoscopy .  Tried to call her on home and cell phone. No answer.  Capsule endoscopy is schedule for 02/23/18 at [place] at 8:30 am. Patient needs instructions.

## 2018-02-13 NOTE — Progress Notes (Signed)
Discharge instructions and medications discussed with patient.  AVS given to patient.  All questions answered.  

## 2018-02-13 NOTE — Care Management CC44 (Signed)
Condition Code 44 Documentation Completed  Patient Details  Name: KIMBERLYANN HOLLAR MRN: [id number] Date of Birth: [birth year]   Condition Code 44 given:  Yes Patient signature on Condition Code 44 notice:  Yes Documentation of 2 MD's agreement:  Yes Code 44 added to claim:  Yes    Leeroy Cha, RN 02/13/2018, 9:59 AM

## 2018-02-13 NOTE — Discharge Summary (Signed)
Physician Discharge Summary  [name] HER:[id number] DOB: 1941-01-29 DOA: 02/12/2018  PCP: Donald Prose, MD  Admit date: 02/12/2018 Discharge date: 02/13/2018  Admitted From: Home Disposition:  Home  Recommendations for Outpatient Follow-up:  1. Follow up with PCP in 1week with repeat CBC/BMP 2. Follow-up with [facility] as an outpatient.  Follow-up appointment is being set up by GI   Home Health: No Equipment/Devices: None  Discharge Condition: Stable CODE STATUS: Full Diet recommendation: Heart Healthy   Brief/Interim Summary: 77 year old female with history of CAD status post CABG and AVR, GERD, hypertension, anemia, PVD, grade 1 diastolic dysfunction, GI bleed with history of duodenal AVMs and cecal ulceration, status post right hip arthroplasty in 08/22/2017 presented with black tarry stools for a week.  Hemoglobin had dropped to 5.7.  She was transfused 2 units of packed red cells.  GI was consulted.  Hemoglobin has improved.  GI has recommended outpatient follow-up for probable capsule endoscopy.  Discharge Diagnoses:  Principal Problem:   Symptomatic anemia Active Problems:   PVD (peripheral vascular disease) (HCC)   Pure hypercholesterolemia   Esophageal reflux   Iron deficiency anemia, unspecified   Essential hypertension   Coronary artery disease involving coronary bypass graft of native heart without angina pectoris   H/O aortic valve replacement   PAH (pulmonary artery hypertension) (HCC)   Angiodysplasia of duodenum   UGI bleed  Acute on chronic anemia probably secondary to acute on chronic GI bleeding -Currently on Protonix drip.  Status post 2 units packed red cells transfusion.  Hemoglobin is 9.6 today.  GI evaluation appreciated.  GI has cleared the patient for discharge and GI will arrange for outpatient follow-up for probable capsule endoscopy -We will put her on a diet and discharge the patient -Continue omeprazole  Atypical chest pain with  dyspnea -Probably secondary to anemia.  Currently chest pain-free.  Troponins negative.  Outpatient follow-up with cardiology  History of coronary artery disease status post CABG and AVR -Discontinue aspirin.  Outpatient follow-up with cardiology.  Continue beta-blocker and Lasix  Hypertension -Blood pressure on the lower side.  Will hold amlodipine and lisinopril/hydrochlorothiazide until patient sees her primary doctor.  Continue beta-blocker and Lasix  Hyperlipidemia -Continue statin  Esophageal reflux -Continue omeprazole    Discharge Instructions  Discharge Instructions    Call MD for:  difficulty breathing, headache or visual disturbances   Complete by:  As directed    Call MD for:  extreme fatigue   Complete by:  As directed    Call MD for:  hives   Complete by:  As directed    Call MD for:  persistant dizziness or light-headedness   Complete by:  As directed    Call MD for:  persistant nausea and vomiting   Complete by:  As directed    Call MD for:  severe uncontrolled pain   Complete by:  As directed    Call MD for:  temperature >100.4   Complete by:  As directed    Diet - low sodium heart healthy   Complete by:  As directed    Increase activity slowly   Complete by:  As directed      Allergies as of 02/13/2018      Reactions   Codeine Other (See Comments)   Feeling intoxicated      Medication List    STOP taking these medications   amLODipine 10 MG tablet Commonly known as:  NORVASC   aspirin EC 81 MG tablet  lisinopril-hydrochlorothiazide 20-25 MG tablet Commonly known as:  PRINZIDE,ZESTORETIC     TAKE these medications   acetaminophen 325 MG tablet Commonly known as:  TYLENOL Take 2 tablets (650 mg total) by mouth every 6 (six) hours as needed for mild pain (or Fever >/= 101).   acetaminophen-codeine 300-30 MG tablet Commonly known as:  TYLENOL #3 Take 1-2 tablets by mouth every 8 (eight) hours as needed for moderate pain.   atorvastatin  80 MG tablet Commonly known as:  LIPITOR TAKE 1 TABLET BY MOUTH EVERY DAY   BYSTOLIC 5 MG tablet Generic drug:  nebivolol TAKE 1 TABLET BY MOUTH EVERY DAY   CALCIUM 600+D 600-400 MG-UNIT tablet Generic drug:  Calcium Carbonate-Vitamin D Take 1 tablet by mouth daily.   ferrous sulfate 325 (65 FE) MG tablet Take 1 tablet (325 mg total) by mouth 2 (two) times daily with a meal.   furosemide 20 MG tablet Commonly known as:  LASIX Take 20 mg daily by mouth.   glycopyrrolate 2 MG tablet Commonly known as:  ROBINUL Take 2 mg by mouth 2 (two) times daily.   LORazepam 0.5 MG tablet Commonly known as:  ATIVAN Take 0.5 mg by mouth at bedtime as needed for anxiety or sleep.   multivitamin tablet Take 1 tablet by mouth daily.   omeprazole 40 MG capsule Commonly known as:  PRILOSEC Take 1 capsule (40 mg total) by mouth 2 (two) times daily.   ondansetron 4 MG tablet Commonly known as:  ZOFRAN Take 1 tablet (4 mg total) by mouth every 6 (six) hours as needed for nausea.   potassium chloride 10 MEQ CR capsule Commonly known as:  MICRO-K TAKE 2 CAPSULES BY MOUTH TWICE A DAY   sertraline 100 MG tablet Commonly known as:  ZOLOFT TAKE 2 TABLETS (200MG ) BY MOUTH ONCE A DAY   tiotropium 18 MCG inhalation capsule Commonly known as:  SPIRIVA HANDIHALER Place 1 capsule (18 mcg total) into inhaler and inhale every morning.      Follow-up Information    Donald Prose, MD. Schedule an appointment as soon as possible for a visit in 1 week(s).   Specialty:  Family Medicine Why:  with repeat cbc/bmp Contact information: [place] [address] [phone]          Allergies  Allergen Reactions  . Codeine Other (See Comments)    Feeling intoxicated    Consultations:  GI   Procedures/Studies:  Subjective: Patient seen and examined at bedside.  She denies any overnight fever, nausea, vomiting, diarrhea.  No current chest pain  Discharge  Exam: Vitals:   02/13/18 0511 02/13/18 0907  BP: 111/61   Pulse: 67   Resp: 16   Temp: 98.4 F (36.9 C)   SpO2: (!) 89% 91%   Vitals:   02/12/18 2053 02/12/18 2209 02/13/18 0511 02/13/18 0907  BP: 113/64 120/78 111/61   Pulse: 69 72 67   Resp: 18 20 16    Temp: 98.3 F (36.8 C) 98.2 F (36.8 C) 98.4 F (36.9 C)   TempSrc: Oral Oral Oral   SpO2: 100% 96% (!) 89% 91%  Weight:      Height:        General: Pt is alert, awake, not in acute distress Cardiovascular: Rate controlled, S1/S2 + Respiratory: Bilateral decreased breath sounds at bases  abdominal: Soft, NT, ND, bowel sounds + Extremities: no edema, no cyanosis    The results of significant diagnostics from this hospitalization (including imaging,  microbiology, ancillary and laboratory) are listed below for reference.     Microbiology: No results found for this or any previous visit (from the past 240 hour(s)).   Labs: BNP (last 3 results) No results for input(s): BNP in the last 8760 hours. Basic Metabolic Panel: Recent Labs  Lab 02/12/18 1244 02/12/18 1250  NA 142 143  K 3.6 3.6  CL 109 107  CO2 20*  --   GLUCOSE 101* 97  BUN 20 16  CREATININE 0.75 0.70  CALCIUM 9.0  --   MG 1.5*  --    Liver Function Tests: Recent Labs  Lab 02/12/18 1244  AST 25  ALT 14  ALKPHOS 75  BILITOT 0.4  PROT 7.1  ALBUMIN 3.2*   No results for input(s): LIPASE, AMYLASE in the last 168 hours. No results for input(s): AMMONIA in the last 168 hours. CBC: Recent Labs  Lab 02/11/18 1202 02/12/18 1244 02/12/18 1250 02/13/18 0242  WBC 6.5 5.3  --  6.8  NEUTROABS 5.0 3.6  --   --   HGB 5.9 Repeated and verified X2.* 5.7* 7.1* 8.6*  HCT 20.4 Repeated and verified X2.* 21.8* 21.0* 29.3*  MCV 81.8 87.9  --  85.7  PLT 169.0 165  --  115*   Cardiac Enzymes: Recent Labs  Lab 02/12/18 1244 02/12/18 2321 02/13/18 0242  TROPONINI <0.03 0.03* <0.03   BNP: Invalid input(s): POCBNP CBG: No results for input(s):  GLUCAP in the last 168 hours. D-Dimer No results for input(s): DDIMER in the last 72 hours. Hgb A1c No results for input(s): HGBA1C in the last 72 hours. Lipid Profile No results for input(s): CHOL, HDL, LDLCALC, TRIG, CHOLHDL, LDLDIRECT in the last 72 hours. Thyroid function studies No results for input(s): TSH, T4TOTAL, T3FREE, THYROIDAB in the last 72 hours.  Invalid input(s): FREET3 Anemia work up No results for input(s): VITAMINB12, FOLATE, FERRITIN, TIBC, IRON, RETICCTPCT in the last 72 hours. Urinalysis    Component Value Date/Time   COLORURINE AMBER (A) 09/30/2017 0351   APPEARANCEUR CLOUDY (A) 09/30/2017 0351   LABSPEC 1.021 09/30/2017 0351   PHURINE 5.0 09/30/2017 0351   GLUCOSEU NEGATIVE 09/30/2017 0351   HGBUR NEGATIVE 09/30/2017 0351   BILIRUBINUR NEGATIVE 09/30/2017 0351   KETONESUR NEGATIVE 09/30/2017 0351   PROTEINUR 30 (A) 09/30/2017 0351   UROBILINOGEN 0.2 12/27/2008 1056   NITRITE NEGATIVE 09/30/2017 0351   LEUKOCYTESUR TRACE (A) 09/30/2017 0351   Sepsis Labs Invalid input(s): PROCALCITONIN,  WBC,  LACTICIDVEN Microbiology No results found for this or any previous visit (from the past 240 hour(s)).   Time coordinating discharge: 35 minutes  SIGNED:   Aline August, MD  [facility] 02/13/2018, 9:18 AM Pager: [phone]  If 7PM-7AM, please contact night-coverage [email or web address] Password [email or web address]

## 2018-02-17 NOTE — Telephone Encounter (Signed)
Spoke with the patient briefly. She tells me I have called too early in the morning.

## 2018-02-19 NOTE — Telephone Encounter (Signed)
Spoke with the patient. She will come by today or tomorrow for CBC. Also will come to this office for instructions for capsule study.

## 2018-02-23 ENCOUNTER — Encounter (HOSPITAL_COMMUNITY)
Admission: RE | Disposition: A | Discharge status: Home / Self Care | Payer: Self-pay | Source: Ambulatory Visit | Attending: Gastroenterology

## 2018-02-23 ENCOUNTER — Telehealth: Payer: Self-pay

## 2018-02-23 ENCOUNTER — Ambulatory Visit (HOSPITAL_COMMUNITY)
Admission: RE | Admit: 2018-02-23 | Discharge: 2018-02-23 | Disposition: A | Discharge status: Home / Self Care | Payer: Medicare Other | Source: Ambulatory Visit | Attending: Gastroenterology | Admitting: Gastroenterology

## 2018-02-23 ENCOUNTER — Encounter: Payer: Self-pay | Admitting: Gastroenterology

## 2018-02-23 DIAGNOSIS — Q2733 Arteriovenous malformation of digestive system vessel: Secondary | ICD-10-CM | POA: Insufficient documentation

## 2018-02-23 DIAGNOSIS — K219 Gastro-esophageal reflux disease without esophagitis: Secondary | ICD-10-CM | POA: Diagnosis not present

## 2018-02-23 DIAGNOSIS — D509 Iron deficiency anemia, unspecified: Secondary | ICD-10-CM | POA: Insufficient documentation

## 2018-02-23 DIAGNOSIS — Z885 Allergy status to narcotic agent status: Secondary | ICD-10-CM | POA: Insufficient documentation

## 2018-02-23 DIAGNOSIS — Z7982 Long term (current) use of aspirin: Secondary | ICD-10-CM | POA: Insufficient documentation

## 2018-02-23 DIAGNOSIS — I10 Essential (primary) hypertension: Secondary | ICD-10-CM | POA: Insufficient documentation

## 2018-02-23 DIAGNOSIS — I25810 Atherosclerosis of coronary artery bypass graft(s) without angina pectoris: Secondary | ICD-10-CM | POA: Insufficient documentation

## 2018-02-23 DIAGNOSIS — D649 Anemia, unspecified: Secondary | ICD-10-CM | POA: Diagnosis present

## 2018-02-23 DIAGNOSIS — E7800 Pure hypercholesterolemia, unspecified: Secondary | ICD-10-CM | POA: Insufficient documentation

## 2018-02-23 DIAGNOSIS — D62 Acute posthemorrhagic anemia: Secondary | ICD-10-CM | POA: Diagnosis not present

## 2018-02-23 DIAGNOSIS — Z79899 Other long term (current) drug therapy: Secondary | ICD-10-CM | POA: Insufficient documentation

## 2018-02-23 DIAGNOSIS — I739 Peripheral vascular disease, unspecified: Secondary | ICD-10-CM | POA: Insufficient documentation

## 2018-02-23 HISTORY — PX: GIVENS CAPSULE STUDY: SHX5432

## 2018-02-23 SURGERY — IMAGING PROCEDURE, GI TRACT, INTRALUMINAL, VIA CAPSULE
Anesthesia: LOCAL

## 2018-02-23 SURGICAL SUPPLY — 1 items: TOWEL COTTON PACK 4EA (MISCELLANEOUS) ×4 IMPLANT

## 2018-02-23 NOTE — Progress Notes (Signed)
Patient originally scheduled @ 0830 this am for capsule. Patient arrived to hospital @ 1245. Patient was NPO. Capsule swallowed @ 1300 with no issues. Patient given instructions on diet for next few hours and to return tomorrow with equipment.

## 2018-02-23 NOTE — Telephone Encounter (Signed)
Patient did not come in for labs and she did not go to have her capsule study. Left her a message to call back.  We had spoken last week. She had agreed to this date for the capsule study.

## 2018-02-24 ENCOUNTER — Encounter: Payer: Self-pay | Admitting: Cardiology

## 2018-02-24 ENCOUNTER — Ambulatory Visit: Payer: Medicare Other | Admitting: Cardiology

## 2018-02-24 VITALS — BP 140/72 | HR 86 | Ht <= 58 in | Wt 135.8 lb

## 2018-02-24 DIAGNOSIS — D649 Anemia, unspecified: Secondary | ICD-10-CM | POA: Diagnosis not present

## 2018-02-24 DIAGNOSIS — I25810 Atherosclerosis of coronary artery bypass graft(s) without angina pectoris: Secondary | ICD-10-CM

## 2018-02-24 DIAGNOSIS — D500 Iron deficiency anemia secondary to blood loss (chronic): Secondary | ICD-10-CM

## 2018-02-24 DIAGNOSIS — R5383 Other fatigue: Secondary | ICD-10-CM | POA: Diagnosis not present

## 2018-02-24 DIAGNOSIS — Z952 Presence of prosthetic heart valve: Secondary | ICD-10-CM

## 2018-02-24 LAB — CBC
HEMATOCRIT: 31.3 % — AB (ref 34.0–46.6)
HEMOGLOBIN: 9.1 g/dL — AB (ref 11.1–15.9)
MCH: 24.3 pg — AB (ref 26.6–33.0)
MCHC: 29.1 g/dL — AB (ref 31.5–35.7)
MCV: 84 fL (ref 79–97)
Platelets: 303 10*3/uL (ref 150–450)
RBC: 3.74 x10E6/uL — AB (ref 3.77–5.28)
RDW: 16.5 % — ABNORMAL HIGH (ref 12.3–15.4)
WBC: 6.6 10*3/uL (ref 3.4–10.8)

## 2018-02-24 NOTE — Telephone Encounter (Signed)
Received a message for [facility]. The patient did show up at noon for the 8 am appointment.

## 2018-02-24 NOTE — Progress Notes (Signed)
[facility]. [address], [place]  [address] Phone: [phone] Fax:  [phone]  Date:  02/24/2018   ID:  [name], DOB [birth year], MRN [id number]  PCP:  Donald Prose, MD   History of Present Illness: [name] is a 77 y.o. female with coronary artery disease status post bypass x2- grafts to diagonal and obtuse marginal 12/29/08, aortic valve replacement with bioprosthetic #21 valve, hypertension, AVM/gastric ulcer with prior GI bleed here for followup.  She was seen by pulmonology, pulmonary function tests showed a normal lung capacity, no airflow obstruction, but markedly decreased diffusion capacity in the face of normal hemoglobin. Per pulmonologist, there is no clear evidence of lung disease, however very concerned about the possibility of pulmonary arterial hypertension. Chest x-ray is unremarkable, BNP was normal.  She underwent right heart cath by Dr. Haroldine Laws on 08/11/2015 which showed PA pressure 53/14, with mean of 29, cardiac output of 3.2, cardiac index 1.9. It appears she has very good filling pressure, however she had mild PAH and moderately elevated PVR. Given relatively normal mixed venous saturation, suspect cardiac output may be falsely low. After discussing with pulmonology, it was decided not to start selective pulmonary dilator at this time.   Echocardiogram (repeated multiple times) demonstrated bioprosthetic aortic valve velocity of 3.0 m/s which is upper limits of normal as well as ascending root dilated aorta of 4.3 cm. Her overall ejection fraction was normal.  In regards to lipids, LDL has been under good control with atorvastatin. Prior lab work reviewed. Previous creatinine 0.6.  She has had continued somatic complaints, aches and pains she states. No exertional anginal symptoms. Left lateral leg pain and we continued to express the importance of exercise and conditioning. We have tried Bystolic instead of metoprolol previously  because of previously described fatigue.  Holter monitor in May of 2015 demonstrated no significant bradycardia, rare PACs. No new medications needed.  07/15/17-today she is here for preoperative hip surgery.  Reviewed nuclear stress test done recently with no ischemia.  She has baseline shortness of breath.  She has hindered with ambulation.  No syncope, no orthopnea.  No heart failure-like symptoms.  She did have a GI bleeding episode, 2 units, gastric ulcer noted in July hospitalization.  02/24/2018- was discharged from the hospital on 02/13/2018 with acute on chronic anemia probably secondary to chronic GI bleeding.  She was on a Protonix drip and received 2 units of blood.  Hemoglobin was 9.6 on discharge.  She had a capsule endoscopy study just performed.  She ended up having some chest discomfort likely secondary to anemia.  Troponins were normal.  Chest pain-free.  Once again post CABG aVR.  Aspirin is off. Saw Dr. Nancy Fetter last Friday.  She still feels very tired, weak.   Wt Readings from Last 3 Encounters:  02/24/18 135 lb 12.8 oz (61.6 kg)  02/12/18 135 lb (61.2 kg)  10/30/17 136 lb (61.7 kg)     Past Medical History:  Diagnosis Date  . Abdominal pain, unspecified site   . Anemia    Iron deficiency anemia  . Anxiety   . Aortic stenosis   . Arthritis   . CAD (coronary artery disease)    psot bypass x2 to diagonal and obtuse marginal. SVG graft-01/08/2009  . Depression   . Dyspnea   . GERD (gastroesophageal reflux disease)   . History of cardiovascular stress test    Myoview 7/16:  Low risk, no ischemia or  scar, EF 72%  . History of echocardiogram    Echo 7/16:  EF 55-60%, no RWMA, Gr 2 DD, AVR ok (mean 20 mmHg), PASP 40 mmHg  . History of GI bleed    With AVM  . Hypercholesterolemia   . Hyperlipidemia   . Hypertension   . Iron deficiency anemia   . Myocardial infarction ([place]) 2010  . Neuromuscular disorder (HCC)    numbness in hands  . Peripheral vascular disease ([place])     righ tleg  . Shoulder impingement syndrome    Right shoulder    Past Surgical History:  Procedure Laterality Date  . ABDOMINAL HYSTERECTOMY    . AORTIC VALVE REPLACEMENT     [place] #21  . CARDIAC CATHETERIZATION N/A 08/11/2015   Procedure: Right Heart Cath;  Surgeon: Jolaine Artist, MD;  Location: [facility];  Service: Cardiovascular;  Laterality: N/A;  . CARDIAC SURGERY  2010   , 2 blockages  . CHOLECYSTECTOMY    . COLONOSCOPY N/A 04/12/2017   Procedure: COLONOSCOPY;  Surgeon: Manus Gunning, MD;  Location: [facility] ENDOSCOPY;  Service: Gastroenterology;  Laterality: N/A;  . CORONARY ARTERY BYPASS GRAFT    . ENTEROSCOPY N/A 10/01/2017   Procedure: ENTEROSCOPY;  Surgeon: Irene Shipper, MD;  Location: [facility] ENDOSCOPY;  Service: Endoscopy;  Laterality: N/A;  . ESOPHAGOGASTRODUODENOSCOPY N/A 04/12/2017   Procedure: ESOPHAGOGASTRODUODENOSCOPY (EGD);  Surgeon: Manus Gunning, MD;  Location: [facility];  Service: Gastroenterology;  Laterality: N/A;  . HEEL SPUR EXCISION  2000   left heel  . SPINE SURGERY  2003   Herniated diskectomy  . TOTAL HIP ARTHROPLASTY Right 08/22/2017   Procedure: RIGHT TOTAL HIP ARTHROPLASTY ANTERIOR APPROACH;  Surgeon: Mcarthur Rossetti, MD;  Location: [facility];  Service: Orthopedics;  Laterality: Right;  . WRIST SURGERY     right wrist    Current Outpatient Medications  Medication Sig Dispense Refill  . acetaminophen (TYLENOL) 325 MG tablet Take 2 tablets (650 mg total) by mouth every 6 (six) hours as needed for mild pain (or Fever >/= 101).    Marland Kitchen acetaminophen-codeine (TYLENOL #3) 300-30 MG tablet Take 1-2 tablets by mouth every 8 (eight) hours as needed for moderate pain. 50 tablet 0  . atorvastatin (LIPITOR) 80 MG tablet TAKE 1 TABLET BY MOUTH EVERY DAY 30 tablet 12  . BYSTOLIC 5 MG tablet TAKE 1 TABLET BY MOUTH EVERY DAY 90 tablet 2  . Calcium Carbonate-Vitamin D (CALCIUM 600+D) 600-400 MG-UNIT per tablet Take 1 tablet by mouth  daily.    . ferrous sulfate 325 (65 FE) MG tablet Take 1 tablet (325 mg total) by mouth 2 (two) times daily with a meal. 60 tablet 0  . furosemide (LASIX) 20 MG tablet Take 20 mg daily by mouth.     Marland Kitchen glycopyrrolate (ROBINUL) 2 MG tablet Take 2 mg by mouth 2 (two) times daily.  1  . LORazepam (ATIVAN) 0.5 MG tablet Take 0.5 mg by mouth at bedtime as needed for anxiety or sleep.     . Multiple Vitamin (MULTIVITAMIN) tablet Take 1 tablet by mouth daily.    Marland Kitchen omeprazole (PRILOSEC) 40 MG capsule Take 1 capsule (40 mg total) by mouth 2 (two) times daily. 60 capsule 0  . potassium chloride (MICRO-K) 10 MEQ CR capsule TAKE 2 CAPSULES BY MOUTH TWICE A DAY 120 capsule 9  . sertraline (ZOLOFT) 100 MG tablet TAKE 2 TABLETS (200MG ) BY MOUTH ONCE A DAY  1  . tiotropium (SPIRIVA HANDIHALER) 18  MCG inhalation capsule Place 1 capsule (18 mcg total) into inhaler and inhale every morning. 30 capsule 12   No current facility-administered medications for this visit.     Allergies:    Allergies  Allergen Reactions  . Codeine Other (See Comments)    Feeling intoxicated    Social History:  The patient  reports that she has been smoking cigarettes.  She has a 52.00 pack-year smoking history. She has never used smokeless tobacco. She reports that she does not drink alcohol or use drugs.   ROS:  Please see the history of present illness.   Fatigue, no syncope, occasional intermediate chest discomfort.  PHYSICAL EXAM: VS:  BP 140/72   Pulse 86   Ht 4\' 9"  (1.448 m)   Wt 135 lb 12.8 oz (61.6 kg)   LMP  (LMP Unknown)   SpO2 (!) 88%   BMI 29.39 kg/m  GEN: Well nourished, well developed, in no acute distress, using walker, fatigued HEENT: normal  Neck: no JVD, carotid bruits, or masses Cardiac: RRR; 2/6 SM, no rubs, or gallops,no edema  Respiratory:  clear to auscultation bilaterally, normal work of breathing GI: soft, nontender, nondistended, + BS MS: no deformity or atrophy  Skin: warm and dry, no  rash Neuro:  Alert and Oriented x 3, Strength and sensation are intact Psych: euthymic mood, full affect    EKG: 07/14/14-sinus rhythm, minimal sinus arrhythmia, no other abnormalities. 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 No significant change from prior.  Holter monitor May 2015 which demonstrated no significant bradycardia, rare PACs  Right heart cath 08/11/15 (dx SOB) Findings:  RA = 7 RV = 49/0/10 PA = 53/14 (29) PCW = 9 Fick cardiac output/index = 3.2/1.9 PVR = 6.4 WU Ao sat = 96% PA sat = 56%, 57%  Assessment: 1. Normal R and L sided filling pressures 2. Mild PAH with moderately elevated PVR 3. Low cardiac output   Plan/Discussion:  Filling pressures look great. She does have mild PAH moderately elevated PVR. Given relatively normal mixed venous saturations suspect Fick cardiac output may be falsely low and this will raise PVR. Would not start selective pulmonary dilators at this time. If symptoms persist could consider cardiopulmonary exercise testing to further evaluate. Will d/w Dr. Lake Bells.  Bensimhon, Daniel,MD  ECHO : 07/16/16  - Left ventricle: The cavity size was normal. There was moderate   concentric hypertrophy. Systolic function was normal. The   estimated ejection fraction was in the range of 60% to 65%. Wall   motion was normal; there were no regional wall motion   abnormalities. Doppler parameters are consistent with abnormal   left ventricular relaxation (grade 1 diastolic dysfunction). - Aortic valve: A prosthesis was present and functioning normally.   The prosthesis had a normal range of motion. The sewing ring   appeared normal, had no rocking motion, and showed no evidence of   dehiscence. Peak velocity (S): 289 cm/s. (within normal limits   post valve replacement) Mean gradient (S): 19 mm Hg. - Mitral valve: Calcified annulus. - Tricuspid  valve: There was trivial regurgitation. - Pulmonary arteries: Systolic pressure was mildly increased. PA   peak pressure: 38 mm Hg (S).  Impressions:  - Similar aortic valve gradients when compared to prior study.  Myoview 03/30/2015- low risk study  LABS: 06/28/13 - LDL 67.  ASSESSMENT AND PLAN:   1. Anemia- capsule study performed.  Transfusion in hospital  May 2019.  Has a diagnosis of angiodysplasia of duodenum.  Now off of aspirin.  Has bioprosthetic valve.  Risks outweigh benefits.  Since her hemoglobin was severely low in the hospital requiring 2 units of blood, I will recheck a CBC to ensure that there is no precipitous drop. 2. Coronary artery disease status post bypass-seems relatively stable.  Troponin was normal in the hospital.  No active chest pain.  Intermittent atypical symptoms.  Off of aspirin because of bleeding.  Beta blocker (bystolic), statin. Previous bypass anatomy reviewed. Secondary prevention. 3. Dyspnea-Dr. Anastasia Pall note reviewed. No evidence of obstruction on pulmonary function test.  Her x-ray does show evidence of COPD.  She does have occasional wheezing she states. Right catheterization reviewed as above. Her previous measurement was 40 mmHg, mild. Smoking cessation nonetheless is key as well as blood pressure control. Continue to encourage exercise. Gave her recommendation for Coricidin HBP for colds. No changes. 4. Aortic valve replacement-upper normal velocity on echocardiogram. Continue to monitor. Murmur appreciated on exam. Discussed with Dr. [name] in the past.  No major changes.  Continue with antibiotic prophylaxis. 5. AVM-prior GI bleed.  2 units of blood given in July 2018 and 5/19.  No NSAIDs.  Endoscopy performed possible gastric ulcer.  Capsule as well. Checking CBC.  6. Hypertension essential-under great control. No changes made.  Stable. 7. Dysphagia-had endoscopies with Dr. Ardis Hughs. Mild gastritis noted. Recommended to chew food well, small  bites. 8. Hyperlipidemia- Overall good control. High-dose statin. 9. Tobacco use-encourage tobacco cessation. 5 cigs a day.  Continue to work on cessation  10. obesity-encourage weight loss.. 11. Dilated aortic root-we will continue to monitor with echocardiogram. Stable 12. 6 month follow up Cecille Rubin, 12 me.  Signed, [name], MD [facility]  02/24/2018 11:43 AM

## 2018-02-24 NOTE — Patient Instructions (Signed)
Medication Instructions:  The current medical regimen is effective;  continue present plan and medications.  Labwork: Please have blood work today. (CBC)  Follow-Up: Follow up in 6 months with Truitt Merle, NP.  You will receive a letter in the mail 2 months before you are due.  Please call us when you receive this letter to schedule your follow up appointment.  Follow up in 1 year with Dr. [name].  You will receive a letter in the mail 2 months before you are due.  Please call us when you receive this letter to schedule your follow up appointment.  If you need a refill on your cardiac medications before your next appointment, please call your pharmacy.  Thank you for choosing [facility]!!

## 2018-03-03 ENCOUNTER — Telehealth: Payer: Self-pay | Admitting: Cardiology

## 2018-03-03 NOTE — Telephone Encounter (Signed)
New Message: ° ° ° ° ° °Pt is returning a call for lab results. °

## 2018-03-03 NOTE — Telephone Encounter (Signed)
Reviewed results of CBC with patient who understands there is mild improvement in her H/H.  She had no questions.

## 2018-03-11 DIAGNOSIS — D62 Acute posthemorrhagic anemia: Secondary | ICD-10-CM

## 2018-03-16 ENCOUNTER — Telehealth: Payer: Self-pay

## 2018-03-16 DIAGNOSIS — D500 Iron deficiency anemia secondary to blood loss (chronic): Secondary | ICD-10-CM

## 2018-03-16 NOTE — Telephone Encounter (Signed)
-----   Message from Milus Banister, MD sent at 03/16/2018  7:39 AM EDT ----- Regarding: RE: Capsule result Richardson Landry, Thanks as always.  Bodee Lafoe, She needs cbc this week.  I'd also like to start her on octreotide-LAR IM injections (long acting, depot formulation): these injections are 20mg  IM every 4 weeks.  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  ROV in 2 months with me as well, CBC just prior.  Please remind her that she should continue taking her iron twice daily as well.   Thanks  DJ   ----- Message ----- From: Yetta Flock, MD Sent: 03/11/2018   5:20 PM To: Milus Banister, MD Subject: Capsule result                                 Melissa Montane, I reviewed this patient's capsule with Amy. She has numerous small bowel AVMs. If she continues to drop her Hgb despite IV iron, not sure if she is a candidate for octreotide, or would warrant deep enteroscopy for ablation.   Report to be scanned.   Richardson Landry

## 2018-03-17 NOTE — Telephone Encounter (Signed)
05/27/18 at 2:15 pm lab order in for this week and 2 months prior to next appt.  The pt has been advised of the information and verbalized understanding.   She will have labs this week.  Dr Ardis Hughs I have not ordered this injection before.  Is this something that the hospital does?

## 2018-03-18 ENCOUNTER — Other Ambulatory Visit: Payer: Self-pay

## 2018-03-18 DIAGNOSIS — K5521 Angiodysplasia of colon with hemorrhage: Secondary | ICD-10-CM

## 2018-03-18 DIAGNOSIS — K922 Gastrointestinal hemorrhage, unspecified: Secondary | ICD-10-CM

## 2018-03-18 MED ORDER — OCTREOTIDE ACETATE 20 MG IM KIT: 20.0000 mg | PACK | INTRAMUSCULAR | Status: AC

## 2018-03-18 NOTE — Telephone Encounter (Signed)
The pt has been given an appt for July 19 8 am short stay [facility] for octreotide injection.  She will get labs tomorrow and again prior to next appt.  The pt has been advised of the information and verbalized understanding.

## 2018-03-20 ENCOUNTER — Other Ambulatory Visit (INDEPENDENT_AMBULATORY_CARE_PROVIDER_SITE_OTHER): Payer: Medicare Other

## 2018-03-20 DIAGNOSIS — D500 Iron deficiency anemia secondary to blood loss (chronic): Secondary | ICD-10-CM | POA: Diagnosis not present

## 2018-03-20 LAB — CBC WITH DIFFERENTIAL/PLATELET
BASOS ABS: 0 10*3/uL (ref 0.0–0.1)
Basophils Relative: 0.3 % (ref 0.0–3.0)
EOS PCT: 1.2 % (ref 0.0–5.0)
Eosinophils Absolute: 0.1 10*3/uL (ref 0.0–0.7)
HEMATOCRIT: 32 % — AB (ref 36.0–46.0)
Hemoglobin: 9.7 g/dL — ABNORMAL LOW (ref 12.0–15.0)
LYMPHS ABS: 0.9 10*3/uL (ref 0.7–4.0)
LYMPHS PCT: 16.4 % (ref 12.0–46.0)
MCHC: 30.2 g/dL (ref 30.0–36.0)
MCV: 80.5 fl (ref 78.0–100.0)
MONOS PCT: 8.9 % (ref 3.0–12.0)
Monocytes Absolute: 0.5 10*3/uL (ref 0.1–1.0)
NEUTROS ABS: 3.9 10*3/uL (ref 1.4–7.7)
Neutrophils Relative %: 73.2 % (ref 43.0–77.0)
Platelets: 234 10*3/uL (ref 150.0–400.0)
RBC: 3.98 Mil/uL (ref 3.87–5.11)
RDW: 21.7 % — ABNORMAL HIGH (ref 11.5–15.5)
WBC: 5.3 10*3/uL (ref 4.0–10.5)

## 2018-03-31 ENCOUNTER — Other Ambulatory Visit (HOSPITAL_COMMUNITY): Payer: Self-pay | Admitting: General Practice

## 2018-03-31 ENCOUNTER — Other Ambulatory Visit: Payer: Self-pay | Admitting: Cardiology

## 2018-03-31 ENCOUNTER — Other Ambulatory Visit: Payer: Self-pay

## 2018-03-31 DIAGNOSIS — K5521 Angiodysplasia of colon with hemorrhage: Secondary | ICD-10-CM

## 2018-03-31 DIAGNOSIS — K922 Gastrointestinal hemorrhage, unspecified: Secondary | ICD-10-CM

## 2018-03-31 DIAGNOSIS — D500 Iron deficiency anemia secondary to blood loss (chronic): Secondary | ICD-10-CM

## 2018-03-31 MED ORDER — OCTREOTIDE ACETATE 20 MG IM KIT: 20.0000 mg | PACK | INTRAMUSCULAR | Status: AC

## 2018-04-10 ENCOUNTER — Encounter (HOSPITAL_COMMUNITY): Payer: Self-pay

## 2018-04-10 ENCOUNTER — Encounter (HOSPITAL_COMMUNITY)
Admission: RE | Admit: 2018-04-10 | Discharge: 2018-04-10 | Disposition: A | Discharge status: Home / Self Care | Payer: Medicare Other | Source: Ambulatory Visit | Attending: Gastroenterology | Admitting: Gastroenterology

## 2018-04-10 DIAGNOSIS — D500 Iron deficiency anemia secondary to blood loss (chronic): Secondary | ICD-10-CM | POA: Diagnosis present

## 2018-04-10 DIAGNOSIS — K5521 Angiodysplasia of colon with hemorrhage: Secondary | ICD-10-CM | POA: Insufficient documentation

## 2018-04-10 DIAGNOSIS — K922 Gastrointestinal hemorrhage, unspecified: Secondary | ICD-10-CM

## 2018-04-10 MED ORDER — OCTREOTIDE ACETATE 20 MG IM KIT
20.0000 mg | PACK | INTRAMUSCULAR | Status: DC
  Administered 2018-04-10: 20 mg via INTRAMUSCULAR
  Filled 2018-04-10: qty 1

## 2018-04-10 NOTE — Discharge Instructions (Signed)

## 2018-05-01 ENCOUNTER — Other Ambulatory Visit: Payer: Self-pay | Admitting: Family Medicine

## 2018-05-01 DIAGNOSIS — R222 Localized swelling, mass and lump, trunk: Secondary | ICD-10-CM

## 2018-05-12 ENCOUNTER — Ambulatory Visit
Admission: RE | Admit: 2018-05-12 | Discharge: 2018-05-12 | Disposition: A | Discharge status: Home / Self Care | Payer: Medicare Other | Source: Ambulatory Visit | Attending: Family Medicine | Admitting: Family Medicine

## 2018-05-12 DIAGNOSIS — R222 Localized swelling, mass and lump, trunk: Secondary | ICD-10-CM

## 2018-05-19 ENCOUNTER — Encounter (HOSPITAL_COMMUNITY): Admission: RE | Admit: 2018-05-19 | Payer: Medicare Other | Source: Ambulatory Visit

## 2018-05-19 DIAGNOSIS — D500 Iron deficiency anemia secondary to blood loss (chronic): Secondary | ICD-10-CM | POA: Insufficient documentation

## 2018-05-19 DIAGNOSIS — K922 Gastrointestinal hemorrhage, unspecified: Secondary | ICD-10-CM | POA: Insufficient documentation

## 2018-05-19 DIAGNOSIS — K5521 Angiodysplasia of colon with hemorrhage: Secondary | ICD-10-CM | POA: Insufficient documentation

## 2018-05-26 ENCOUNTER — Encounter (HOSPITAL_COMMUNITY): Payer: Medicare Other

## 2018-05-27 ENCOUNTER — Ambulatory Visit: Payer: Medicare Other | Admitting: Gastroenterology

## 2018-06-08 ENCOUNTER — Other Ambulatory Visit: Payer: Self-pay | Admitting: Cardiology

## 2018-06-09 ENCOUNTER — Other Ambulatory Visit: Payer: Self-pay | Admitting: Cardiology

## 2018-06-09 NOTE — Telephone Encounter (Signed)
Allergies as of 02/13/2018      Reactions   Codeine Other (See Comments)   Feeling intoxicated         Medication List    STOP taking these medications   amLODipine 10 MG tablet Commonly known as:  NORVASC   aspirin EC 81 MG tablet   lisinopril-hydrochlorothiazide 20-25 MG tablet Commonly known as:  PRINZIDE,ZESTORETIC

## 2018-06-10 ENCOUNTER — Other Ambulatory Visit: Payer: Self-pay | Admitting: Cardiology

## 2018-06-10 ENCOUNTER — Telehealth: Payer: Self-pay | Admitting: Gastroenterology

## 2018-06-10 NOTE — Telephone Encounter (Signed)
The pt's daughter states she will speak with Dr Ardis Hughs on Friday about the need for continued Sandostatin injections

## 2018-06-11 ENCOUNTER — Other Ambulatory Visit: Payer: Self-pay | Admitting: Cardiology

## 2018-06-12 ENCOUNTER — Ambulatory Visit (INDEPENDENT_AMBULATORY_CARE_PROVIDER_SITE_OTHER): Payer: Medicare Other | Admitting: Gastroenterology

## 2018-06-12 ENCOUNTER — Other Ambulatory Visit (INDEPENDENT_AMBULATORY_CARE_PROVIDER_SITE_OTHER): Payer: Medicare Other

## 2018-06-12 ENCOUNTER — Encounter: Payer: Self-pay | Admitting: Gastroenterology

## 2018-06-12 ENCOUNTER — Telehealth: Payer: Self-pay

## 2018-06-12 VITALS — BP 100/52 | HR 58 | Ht <= 58 in | Wt 126.2 lb

## 2018-06-12 DIAGNOSIS — D649 Anemia, unspecified: Secondary | ICD-10-CM

## 2018-06-12 DIAGNOSIS — K5521 Angiodysplasia of colon with hemorrhage: Secondary | ICD-10-CM

## 2018-06-12 DIAGNOSIS — D509 Iron deficiency anemia, unspecified: Secondary | ICD-10-CM | POA: Diagnosis not present

## 2018-06-12 LAB — CBC WITH DIFFERENTIAL/PLATELET
BASOS PCT: 0.4 % (ref 0.0–3.0)
Basophils Absolute: 0 10*3/uL (ref 0.0–0.1)
EOS PCT: 1 % (ref 0.0–5.0)
Eosinophils Absolute: 0.1 10*3/uL (ref 0.0–0.7)
HCT: 33 % — ABNORMAL LOW (ref 36.0–46.0)
Hemoglobin: 11 g/dL — ABNORMAL LOW (ref 12.0–15.0)
LYMPHS ABS: 1.6 10*3/uL (ref 0.7–4.0)
Lymphocytes Relative: 24.6 % (ref 12.0–46.0)
MCHC: 33.2 g/dL (ref 30.0–36.0)
MCV: 90.2 fl (ref 78.0–100.0)
MONO ABS: 0.6 10*3/uL (ref 0.1–1.0)
Monocytes Relative: 8.7 % (ref 3.0–12.0)
NEUTROS ABS: 4.3 10*3/uL (ref 1.4–7.7)
NEUTROS PCT: 65.3 % (ref 43.0–77.0)
PLATELETS: 188 10*3/uL (ref 150.0–400.0)
RBC: 3.66 Mil/uL — AB (ref 3.87–5.11)
RDW: 19.9 % — AB (ref 11.5–15.5)
WBC: 6.6 10*3/uL (ref 4.0–10.5)

## 2018-06-12 NOTE — Patient Instructions (Addendum)
Monthly octreotide shots, we will clarify.  You will have labs checked today in the basement lab.  Please head down after you check out with the front desk  (cbc).  Please return to see Dr. Ardis Hughs in 4 months, sooner if any problems.  Stay on iron twice daily.  Normal BMI (Body Mass Index- based on height and weight) is between 23 and 30. Your BMI today is Body mass index is 27.32 kg/m. Marland Kitchen Please consider follow up  regarding your BMI with your Primary Care Provider.  Thank you for entrusting me with your care and choosing [facility].  Dr Ardis Hughs

## 2018-06-12 NOTE — Telephone Encounter (Signed)
Patient has been set up for Octreotide infusions at [facility]. She is only able to go on Thursday or Friday due to transportation issues. Her next infusion will be 07/09/18 at 9am and every 4 weeks. All appointments have been made. Daughter [name]  Made aware of the date and times and voiced understanding.

## 2018-06-12 NOTE — Progress Notes (Signed)
Review of pertinent gastrointestinal problems: 1.  Hx of intestinal AVMs causing chronic IDA (intermittenly VERY low Hb). Remote history of Hemoccult-positive stool and recurrent anemia evaluated by [facility]. 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 Colonoscopy July 2009 - ulcerated cecum and ileocecal valve. Exam otherwise normal. Ileocecal valve and cecum biopsies revealed surface ulceration/erosion and ischemic changes. Small bowel video capsule study 2010 revealed nonbleeding AVMs throughout the small bowel.  Admitted  2018 with heme + anemia (hb 8s when it normally was 13).  03/2017 colonoscopy Dr. Havery Moros: normal TI, single friable AVM in cecum ablated with APC. 03/2017 EGD Dr. Havery Moros: 2 'dimunitive' non-bleeding gastric ulcers.  Single AVM treated with ACP.  Biopsies from stomach neg for H. Pylori.  In patient Enteroscopy (Hb was 4.4) Dr. Henrene Pastor 09/2017 which showed multiple duodenal and proximal jejunal AVMs all of which were APC'd.  Repeat small bowel capsule 02/2018 again confirmed multiple small bowel AVMS.  03/2018 Started octreotide LAR 20mg   IM injections q 4 weeks 2. Dysphagia, EGD Dr. Ardis Hughs 9 2016showed mild nonspecific gastritis. This was H. pylori negative. GE junction was normal. She was instructed to chew her food well, eat slowly and take small bites. 3. Chronic loose stools, colonoscopy Dr. Ardis Hughs 9 2016was very poorly prepped, random biopsies of the colon showed no sign of microscopic colitis however. She was recommended to have repeat colonoscopy with improved prep.   HPI: This is a very pleasant 77 year old woman who was last here in our office several months ago.  She was supposed to have a CBC just prior to this visit but she did not.  She is here with her daughter today.  She apparently has been living with her daughter the past several months.  Watery diarrhea a week ago. Bowels back to normal now.  Always dark stools on iron  Takes iron twice a day.  She missed her  octreotide IM shots (august and sept).  SHe actually only had ONE of octreotide shots.   Chief complaint is intestinal AVMs, recurrent anemia  ROS: complete GI ROS as described in HPI, all other review negative.  Constitutional:  No unintentional weight loss (down 10 pounds since last ov here   Past Medical History:  Diagnosis Date  . Abdominal pain, unspecified site   . Anemia    Iron deficiency anemia  . Anxiety   . Aortic stenosis   . Arthritis   . CAD (coronary artery disease)    psot bypass x2 to diagonal and obtuse marginal. SVG graft-01/08/2009  . Depression   . Dyspnea   . GERD (gastroesophageal reflux disease)   . History of cardiovascular stress test    Myoview 7/16:  Low risk, no ischemia or scar, EF 72%  . History of echocardiogram    Echo 7/16:  EF 55-60%, no RWMA, Gr 2 DD, AVR ok (mean 20 mmHg), PASP 40 mmHg  . History of GI bleed    With AVM  . Hypercholesterolemia   . Hyperlipidemia   . Hypertension   . Iron deficiency anemia   . Myocardial infarction ([place]) 2010  . Neuromuscular disorder (HCC)    numbness in hands  . Peripheral vascular disease ([place])    righ tleg  . Shoulder impingement syndrome    Right shoulder    Past Surgical History:  Procedure Laterality Date  . ABDOMINAL HYSTERECTOMY    . AORTIC VALVE REPLACEMENT     [facility] #21  . CARDIAC CATHETERIZATION N/A 08/11/2015   Procedure: Right Heart  Cath;  Surgeon: Jolaine Artist, MD;  Location: [facility];  Service: Cardiovascular;  Laterality: N/A;  . CARDIAC SURGERY  2010   , 2 blockages  . CHOLECYSTECTOMY    . COLONOSCOPY N/A 04/12/2017   Procedure: COLONOSCOPY;  Surgeon: Manus Gunning, MD;  Location: [facility] ENDOSCOPY;  Service: Gastroenterology;  Laterality: N/A;  . CORONARY ARTERY BYPASS GRAFT    . ENTEROSCOPY N/A 10/01/2017   Procedure: ENTEROSCOPY;  Surgeon: Irene Shipper, MD;  Location: [facility] ENDOSCOPY;  Service: Endoscopy;  Laterality: N/A;  .  ESOPHAGOGASTRODUODENOSCOPY N/A 04/12/2017   Procedure: ESOPHAGOGASTRODUODENOSCOPY (EGD);  Surgeon: Manus Gunning, MD;  Location: [facility];  Service: Gastroenterology;  Laterality: N/A;  . GIVENS CAPSULE STUDY N/A 02/23/2018   Procedure: GIVENS CAPSULE STUDY;  Surgeon: Milus Banister, MD;  Location: [facility] ENDOSCOPY;  Service: Endoscopy;  Laterality: N/A;  . HEEL SPUR EXCISION  2000   left heel  . SPINE SURGERY  2003   Herniated diskectomy  . TOTAL HIP ARTHROPLASTY Right 08/22/2017   Procedure: RIGHT TOTAL HIP ARTHROPLASTY ANTERIOR APPROACH;  Surgeon: Mcarthur Rossetti, MD;  Location: [facility];  Service: Orthopedics;  Laterality: Right;  . WRIST SURGERY     right wrist    Current Outpatient Medications  Medication Sig Dispense Refill  . acetaminophen (TYLENOL) 325 MG tablet Take 2 tablets (650 mg total) by mouth every 6 (six) hours as needed for mild pain (or Fever >/= 101).    Marland Kitchen atorvastatin (LIPITOR) 80 MG tablet TAKE 1 TABLET BY MOUTH EVERY DAY 30 tablet 12  . BYSTOLIC 5 MG tablet TAKE 1 TABLET BY MOUTH EVERY DAY 90 tablet 3  . Calcium Carbonate-Vitamin D (CALCIUM 600+D) 600-400 MG-UNIT per tablet Take 1 tablet by mouth daily.    . ferrous sulfate 325 (65 FE) MG tablet Take 1 tablet (325 mg total) by mouth 2 (two) times daily with a meal. 60 tablet 0  . furosemide (LASIX) 20 MG tablet Take 20 mg daily by mouth.     Marland Kitchen glycopyrrolate (ROBINUL) 2 MG tablet Take 2 mg by mouth 2 (two) times daily.  1  . LORazepam (ATIVAN) 0.5 MG tablet Take 0.5 mg by mouth at bedtime as needed for anxiety or sleep.     . Multiple Vitamin (MULTIVITAMIN) tablet Take 1 tablet by mouth daily.    Marland Kitchen omeprazole (PRILOSEC) 40 MG capsule Take 1 capsule (40 mg total) by mouth 2 (two) times daily. 60 capsule 0  . potassium chloride (MICRO-K) 10 MEQ CR capsule TAKE 2 CAPSULES BY MOUTH TWICE A DAY 120 capsule 9  . sertraline (ZOLOFT) 100 MG tablet TAKE 2 TABLETS (200MG ) BY MOUTH ONCE A DAY  1  . tiotropium  (SPIRIVA HANDIHALER) 18 MCG inhalation capsule Place 1 capsule (18 mcg total) into inhaler and inhale every morning. 30 capsule 12   Current Facility-Administered Medications  Medication Dose Route Frequency Provider Last Rate Last Dose  . octreotide (SANDOSTATIN LAR) IM injection 20 mg  20 mg Intramuscular Q30 days Milus Banister, MD      . octreotide (SANDOSTATIN LAR) IM injection 20 mg  20 mg Intramuscular Q30 days Milus Banister, MD        Allergies as of 06/12/2018 - Review Complete 06/12/2018  Allergen Reaction Noted  . Codeine Other (See Comments) 10/14/2011    Family History  Problem Relation Age of Onset  . Hypertension Mother   . Kidney disease Mother   . Hypertension Father   .  Hypertension Sister   . Hypertension Brother   . Hypertension Daughter   . Diabetes Brother   . Heart attack Neg Hx   . Stroke Neg Hx     Social History   Socioeconomic History  . Marital status: Single    Spouse name: Not on file  . Number of children: 3  . Years of education: Not on file  . Highest education level: Not on file  Occupational History  . Occupation: Retired  Scientific laboratory technician  . Financial resource strain: Not on file  . Food insecurity:    Worry: Not on file    Inability: Not on file  . Transportation needs:    Medical: Not on file    Non-medical: Not on file  Tobacco Use  . Smoking status: Current Some Day Smoker    Packs/day: 1.00    Years: 52.00    Pack years: 52.00    Types: Cigarettes    Last attempt to quit: 01/21/2017    Years since quitting: 1.3  . Smokeless tobacco: Never Used  . Tobacco comment: smokes 2-3 cigarettes per day  Substance and Sexual Activity  . Alcohol use: No    Alcohol/week: 0.0 standard drinks  . Drug use: No  . Sexual activity: Not Currently  Lifestyle  . Physical activity:    Days per week: Not on file    Minutes per session: Not on file  . Stress: Not on file  Relationships  . Social connections:    Talks on phone: Not on  file    Gets together: Not on file    Attends religious service: Not on file    Active member of club or organization: Not on file    Attends meetings of clubs or organizations: Not on file    Relationship status: Not on file  . Intimate partner violence:    Fear of current or ex partner: Not on file    Emotionally abused: Not on file    Physically abused: Not on file    Forced sexual activity: Not on file  Other Topics Concern  . Not on file  Social History Narrative  . Not on file     Physical Exam: BP (!) 100/52   Pulse (!) 58   Ht 4\' 9"  (1.448 m)   Wt 126 lb 4 oz (57.3 kg)   LMP  (LMP Unknown)   BMI 27.32 kg/m  Constitutional: generally well-appearing Psychiatric: alert and oriented x3 Abdomen: soft, nontender, nondistended, no obvious ascites, no peritoneal signs, normal bowel sounds No peripheral edema noted in lower extremities  Assessment and plan: 77 y.o. 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  No clinical suggestion of significant bleeding recently.  She does take iron twice daily which makes it a bit difficult since it colors her stool quite dark.  I started her on octreotide intramuscular shots to help decrease the bleeding from her small bowel AVMs.  Unfortunately she only completed 1 out of 3 of those shots in the past 3 months.  We will work with her and her daughter to get her back on monthly octreotide shots.  She will also get a CBC today.  She will continue her iron twice daily.  She will return to see me in 3 to 4 months.  She understands the goal of this therapy iron daily and octreotide monthly is to keep her from being readmitted to the hospital or needing blood transfusions  in the future.  Please see the "Patient Instructions" section for addition details about the plan.  Owens Loffler, MD [facility] 06/12/2018, 1:54 PM

## 2018-06-16 ENCOUNTER — Ambulatory Visit (INDEPENDENT_AMBULATORY_CARE_PROVIDER_SITE_OTHER): Payer: Medicare Other | Admitting: Nurse Practitioner

## 2018-06-16 ENCOUNTER — Encounter: Payer: Self-pay | Admitting: Nurse Practitioner

## 2018-06-16 VITALS — BP 120/68 | HR 74 | Ht <= 58 in | Wt 124.4 lb

## 2018-06-16 DIAGNOSIS — Z952 Presence of prosthetic heart valve: Secondary | ICD-10-CM

## 2018-06-16 DIAGNOSIS — I77810 Thoracic aortic ectasia: Secondary | ICD-10-CM

## 2018-06-16 DIAGNOSIS — R0602 Shortness of breath: Secondary | ICD-10-CM | POA: Diagnosis not present

## 2018-06-16 DIAGNOSIS — I2721 Secondary pulmonary arterial hypertension: Secondary | ICD-10-CM

## 2018-06-16 DIAGNOSIS — I25810 Atherosclerosis of coronary artery bypass graft(s) without angina pectoris: Secondary | ICD-10-CM | POA: Diagnosis not present

## 2018-06-16 DIAGNOSIS — R2230 Localized swelling, mass and lump, unspecified upper limb: Secondary | ICD-10-CM

## 2018-06-16 DIAGNOSIS — R222 Localized swelling, mass and lump, trunk: Secondary | ICD-10-CM

## 2018-06-16 NOTE — Patient Instructions (Addendum)
We will be checking the following labs today - BMET, CBC   Medication Instructions:    Continue with your current medicines.     Testing/Procedures To Be Arranged:  Echocardiogram  CT of the chest with contrast to evaluate upper sternal mass  Follow-Up:   See me as planned in December    Other Special Instructions:   Needs follow up visit with Dr. Lake Bells arranged - this has been made for October 3rd at 10:45 AM   If you need a refill on your cardiac medications before your next appointment, please call your pharmacy.   Call the [facility] [place] office at [phone] if you have any questions, problems or concerns.

## 2018-06-16 NOTE — Progress Notes (Signed)
CARDIOLOGY OFFICE NOTE  Date:  06/16/2018    [name] Date of Birth: July 21, 1941 Medical Record #[id number]  PCP:  Donald Prose, MD  Cardiologist:  Marisa Cyphers   Chief Complaint  Patient presents with  . Shortness of Breath    Work in visit - seen for Dr. [name]    History of Present Illness: [name] is a 77 y.o. female who presents today for a work in visit. Seen for Dr. [name].   She has a history of coronary artery disease s/p CABG x2 in 2010 per Dr. [name] with  grafts to diagonal and obtuse marginal in 2010 along with aortic valve replacement with bioprosthetic #21 valve. Other issues include HTN, & AVM/gastric ulcer with recurrent GI bleed - no longer on aspirin.    She has had issues with ongoing shortness of breath. Extensive work up several years ago - has mild PAH. Elected to not start selective pulmonary dilator at that time. She has been followed by Dr. Lake Bells.  Multiple echos done with normal EF, ascending root dilated and upper limits of normal of aortic valve velocity noted.   She has tended to have lots of somatic complaints. She has tried Bystolic instead of metoprolol in the past due to fatigue. Remote Holter 2015 with no significant bradycardia, rare PACs. She had GI bleed in May of 2019 - no longer on aspirin. Last seen by Dr. [name] in June - remained tired and weak.   Comes in today. Here with her daughter. She is here due to feeling "nervousness" and a "knot" on her chest. She was told to come here to get checked. She had an ultrasound of this mass on her chest - see below - CT or MRI recommended. Needs to be arranged. She tells me she is nervous. Told my CMA that her Ativan has been stopped. Did not mention this to me. She remains short of breath - does not sound like this has progressed. She continues to smoke - about a pack every 3 days. She does not appear ready to stop. She has not kept follow up with pulmonary. She has had more  issues this year with bleeding - not currently bleeding. No chest pain. Seems pretty sedentary. She says she is having issues getting one of her medicines refilled - says it is a BP medicine with a dose of "20 to 25" - this does not match up with her list.   Past Medical History:  Diagnosis Date  . Abdominal pain, unspecified site   . Anemia    Iron deficiency anemia  . Anxiety   . Aortic stenosis   . Arthritis   . CAD (coronary artery disease)    psot bypass x2 to diagonal and obtuse marginal. SVG graft-01/08/2009  . Depression   . Dyspnea   . GERD (gastroesophageal reflux disease)   . History of cardiovascular stress test    Myoview 7/16:  Low risk, no ischemia or scar, EF 72%  . History of echocardiogram    Echo 7/16:  EF 55-60%, no RWMA, Gr 2 DD, AVR ok (mean 20 mmHg), PASP 40 mmHg  . History of GI bleed    With AVM  . Hypercholesterolemia   . Hyperlipidemia   . Hypertension   . Iron deficiency anemia   . Myocardial infarction ([place]) 2010  . Neuromuscular disorder (HCC)    numbness in hands  . Peripheral vascular disease ([place])    righ tleg  .  Shoulder impingement syndrome    Right shoulder    Past Surgical History:  Procedure Laterality Date  . ABDOMINAL HYSTERECTOMY    . AORTIC VALVE REPLACEMENT     [place] #[id number]  . CARDIAC CATHETERIZATION N/A 08/11/2015   Procedure: Right Heart Cath;  Surgeon: Jolaine Artist, MD;  Location: [facility];  Service: Cardiovascular;  Laterality: N/A;  . CARDIAC SURGERY  2010   , 2 blockages  . CHOLECYSTECTOMY    . COLONOSCOPY N/A 04/12/2017   Procedure: COLONOSCOPY;  Surgeon: Manus Gunning, MD;  Location: [facility] ENDOSCOPY;  Service: Gastroenterology;  Laterality: N/A;  . CORONARY ARTERY BYPASS GRAFT    . ENTEROSCOPY N/A 10/01/2017   Procedure: ENTEROSCOPY;  Surgeon: Irene Shipper, MD;  Location: [facility] ENDOSCOPY;  Service: Endoscopy;  Laterality: N/A;  . ESOPHAGOGASTRODUODENOSCOPY N/A 04/12/2017   Procedure:  ESOPHAGOGASTRODUODENOSCOPY (EGD);  Surgeon: Manus Gunning, MD;  Location: [facility];  Service: Gastroenterology;  Laterality: N/A;  . GIVENS CAPSULE STUDY N/A 02/23/2018   Procedure: GIVENS CAPSULE STUDY;  Surgeon: Milus Banister, MD;  Location: [facility] ENDOSCOPY;  Service: Endoscopy;  Laterality: N/A;  . HEEL SPUR EXCISION  2000   left heel  . SPINE SURGERY  2003   Herniated diskectomy  . TOTAL HIP ARTHROPLASTY Right 08/22/2017   Procedure: RIGHT TOTAL HIP ARTHROPLASTY ANTERIOR APPROACH;  Surgeon: Mcarthur Rossetti, MD;  Location: [facility] ORS;  Service: Orthopedics;  Laterality: Right;  . WRIST SURGERY     right wrist     Medications: Current Meds  Medication Sig  . acetaminophen (TYLENOL) 325 MG tablet Take 2 tablets (650 mg total) by mouth every 6 (six) hours as needed for mild pain (or Fever >/= 101).  Marland Kitchen atorvastatin (LIPITOR) 80 MG tablet TAKE 1 TABLET BY MOUTH EVERY DAY  . BYSTOLIC 5 MG tablet TAKE 1 TABLET BY MOUTH EVERY DAY  . Calcium Carbonate-Vitamin D (CALCIUM 600+D) 600-400 MG-UNIT per tablet Take 1 tablet by mouth daily.  . ferrous sulfate 325 (65 FE) MG tablet Take 1 tablet (325 mg total) by mouth 2 (two) times daily with a meal.  . furosemide (LASIX) 20 MG tablet Take 20 mg daily by mouth.   Marland Kitchen glycopyrrolate (ROBINUL) 2 MG tablet Take 2 mg by mouth 2 (two) times daily.  . Multiple Vitamin (MULTIVITAMIN) tablet Take 1 tablet by mouth daily.  Marland Kitchen omeprazole (PRILOSEC) 40 MG capsule Take 1 capsule (40 mg total) by mouth 2 (two) times daily.  . potassium chloride (MICRO-K) 10 MEQ CR capsule TAKE 2 CAPSULES BY MOUTH TWICE A DAY  . sertraline (ZOLOFT) 100 MG tablet TAKE 2 TABLETS (200MG ) BY MOUTH ONCE A DAY  . tiotropium (SPIRIVA HANDIHALER) 18 MCG inhalation capsule Place 1 capsule (18 mcg total) into inhaler and inhale every morning.  . [DISCONTINUED] LORazepam (ATIVAN) 0.5 MG tablet Take 0.5 mg by mouth at bedtime as needed for anxiety or sleep.    Current  Facility-Administered Medications for the 06/16/18 encounter (Office Visit) with Burtis Junes, NP  Medication  . octreotide (SANDOSTATIN LAR) IM injection 20 mg  . octreotide (SANDOSTATIN LAR) IM injection 20 mg     Allergies: Allergies  Allergen Reactions  . Codeine Other (See Comments)    Feeling intoxicated    Social History: The patient  reports that she has been smoking cigarettes. She has a 52.00 pack-year smoking history. She has never used smokeless tobacco. She reports that she does not drink alcohol or use drugs.   Family  History: The patient's family history includes Diabetes in her brother; Hypertension in her brother, daughter, father, mother, and sister; Kidney disease in her mother.   Review of Systems: Please see the history of present illness.   Otherwise, the review of systems is positive for none.   All other systems are reviewed and negative.   Physical Exam: VS:  BP 120/68 (BP Location: Left Arm, Patient Position: Sitting, Cuff Size: Normal)   Pulse 74   Ht 4\' 9"  (1.448 m)   Wt 124 lb 6.4 oz (56.4 kg)   LMP  (LMP Unknown)   SpO2 95% Comment: at rest  BMI 26.92 kg/m  .  BMI Body mass index is 26.92 kg/m.  Wt Readings from Last 3 Encounters:  06/16/18 124 lb 6.4 oz (56.4 kg)  06/12/18 126 lb 4 oz (57.3 kg)  04/10/18 122 lb (55.3 kg)    General: Elderly. Looks frail. Alert and in no acute distress.   HEENT: Normal.  Neck: Supple, no JVD, carotid bruits, or masses noted.  Cardiac: Regular rate and rhythm. Soft outflow murmur. She has the size of a fist like mass at the upper sternum noted. No edema.  Respiratory:  Lungs are clear to auscultation bilaterally with normal work of breathing.  GI: Soft and nontender.  MS: No deformity or atrophy. Gait and ROM intact. Using a walker.  Skin: Warm and dry. Color is normal.  Neuro:  Strength and sensation are intact and no gross focal deficits noted.  Psych: Alert, appropriate and with normal  affect.     LABORATORY DATA:  EKG:  EKG is ordered today. This demonstrates NSR with nonspecific ST and T wave changes.  Lab Results  Component Value Date   WBC 6.6 06/12/2018   HGB 11.0 (L) 06/12/2018   HCT 33.0 (L) 06/12/2018   PLT 188.0 06/12/2018   GLUCOSE 97 02/12/2018   CHOL 114 04/08/2017   TRIG 108 04/08/2017   HDL 30 (L) 04/08/2017   LDLCALC 62 04/08/2017   ALT 14 02/12/2018   AST 25 02/12/2018   NA 143 02/12/2018   K 3.6 02/12/2018   CL 107 02/12/2018   CREATININE 0.70 02/12/2018   BUN 16 02/12/2018   CO2 20 (L) 02/12/2018   TSH 1.390 04/08/2017   INR 1.38 09/30/2017   HGBA1C  12/27/2008    5.6 (NOTE) The ADA recommends the following therapeutic goal for glycemic control related to Hgb A1c measurement: Goal of therapy: <6.5 Hgb A1c  Reference: American Diabetes Association: Clinical Practice Recommendations 2010, Diabetes Care, 2010, 33: (Suppl  1).     BNP (last 3 results) No results for input(s): BNP in the last 8760 hours.  ProBNP (last 3 results) No results for input(s): PROBNP in the last 8760 hours.   Other Studies Reviewed Today:  Chest ultrasound IMPRESSION 04/2018: Lobulated well-circumscribed vascular mass measuring up to 5.7 cm just superficial to the ribs. Given prior surgery and superficial scarring this may represent a large keloid or hypertrophic scar. Neoplasm is less likely, but possible. Consider CT or MRI to further characterize and follow-up is recommended to ensure stability.   Electronically Signed   By: Kristine Garbe M.D.   On: 05/12/2018 16:03   Myoview Study Highlights 05/2017     The left ventricular ejection fraction is hyperdynamic (>65%).  Nuclear stress EF: 70%.  There was no ST segment deviation noted during stress.  The study is normal.  This is a low risk study.   Normal stress nuclear  study with no ischemia or infarction; EF 70 with normal wall motion.   Echo Study Conclusions  06/2016  - Left ventricle: The cavity size was normal. There was moderate   concentric hypertrophy. Systolic function was normal. The   estimated ejection fraction was in the range of 60% to 65%. Wall   motion was normal; there were no regional wall motion   abnormalities. Doppler parameters are consistent with abnormal   left ventricular relaxation (grade 1 diastolic dysfunction). - Aortic valve: A prosthesis was present and functioning normally.   The prosthesis had a normal range of motion. The sewing ring   appeared normal, had no rocking motion, and showed no evidence of   dehiscence. Peak velocity (S): 289 cm/s. (within normal limits   post valve replacement) Mean gradient (S): 19 mm Hg. - Mitral valve: Calcified annulus. - Tricuspid valve: There was trivial regurgitation. - Pulmonary arteries: Systolic pressure was mildly increased. PA   peak pressure: 38 mm Hg (S).  Impressions:  - Similar aortic valve gradients when compared to prior study.   Right Heart Cath 07/2015  Conclusion   Findings:  RA = 7 RV = 49/0/10 PA = 53/14 (29) PCW = 9 Fick cardiac output/index = 3.2/1.9 PVR = 6.4 WU Ao sat = 96% PA sat = 56%, 57%  Assessment: 1. Normal R and L sided filling pressures 2. Mild PAH with moderately elevated PVR 3. Low cardiac output  Plan/Discussion:  Filling pressures look great. She does have mild PAH moderately elevated PVR. Given relatively normal mixed venous saturations suspect Fick cardiac output may be falsely low and this will raise PVR. Would not start selective pulmonary dilators at this time. If symptoms persist could consider cardiopulmonary exercise testing to further evaluate. Will d/w Dr. Lake Bells.  Bensimhon, Daniel,MD 12:57 PM    Assessment & Plan:  1. Shortness of breath - this seems unchanged. She continues to smoke - needs pulmonary follow up.   2. Upper chest mass - needs CT to further define - may need to get back to see Dr.  [name].   3. Mild PAH - will get her back to see pulmonary.   4. Anemia - with prior transfusion in May of 2019 - has angiodysplasia of duodenum - off aspirin - with bioprosthetic valve - unfortunately risks now outweigh benefits.   5. CAD - prior CABG - not on aspirin due to bleeding  6. AVR - bioprosthetic - needs to continue with SBE - needs echo updated.   7. Ongoing tobacco use - she is not ready to stop.   8. HTN - BP is fine. She is to call us back to verify her medicines.   9. HLD - not addressed today.   10. Dilated aorta - getting repeat echo.   Current medicines are reviewed with the patient today.  The patient does not have concerns regarding medicines other than what has been noted above.  The following changes have been made:  See above.  Labs/ tests ordered today include:    Orders Placed This Encounter  Procedures  . EKG 12-Lead     Disposition:   FU with me as planned in December unless studies are abnormal.    Patient is agreeable to this plan and will call if any problems develop in the interim.   SignedTruitt Merle, NP  06/16/2018 12:31 PM  [facility] [address] [place], [place]  [address] Phone: [phone] Fax: [phone])  [phone]        

## 2018-06-17 ENCOUNTER — Encounter (HOSPITAL_COMMUNITY): Admission: RE | Admit: 2018-06-17 | Payer: Medicare Other | Source: Ambulatory Visit

## 2018-06-17 LAB — BASIC METABOLIC PANEL
BUN/Creatinine Ratio: 12 (ref 12–28)
BUN: 7 mg/dL — ABNORMAL LOW (ref 8–27)
CO2: 21 mmol/L (ref 20–29)
Calcium: 10.4 mg/dL — ABNORMAL HIGH (ref 8.7–10.3)
Chloride: 107 mmol/L — ABNORMAL HIGH (ref 96–106)
Creatinine, Ser: 0.6 mg/dL (ref 0.57–1.00)
GFR calc Af Amer: 102 mL/min/{1.73_m2} (ref >59)
GFR calc non Af Amer: 88 mL/min/{1.73_m2} (ref >59)
Glucose: 83 mg/dL (ref 65–99)
Potassium: 4.7 mmol/L (ref 3.5–5.2)
Sodium: 145 mmol/L — ABNORMAL HIGH (ref 134–144)

## 2018-06-17 LAB — CBC
Hematocrit: 35.5 % (ref 34.0–46.6)
Hemoglobin: 11.4 g/dL (ref 11.1–15.9)
MCH: 30.2 pg (ref 26.6–33.0)
MCHC: 32.1 g/dL (ref 31.5–35.7)
MCV: 94 fL (ref 79–97)
Platelets: 218 10*3/uL (ref 150–450)
RBC: 3.77 x10E6/uL (ref 3.77–5.28)
RDW: 20.1 % — ABNORMAL HIGH (ref 12.3–15.4)
WBC: 6.6 10*3/uL (ref 3.4–10.8)

## 2018-06-22 ENCOUNTER — Ambulatory Visit (INDEPENDENT_AMBULATORY_CARE_PROVIDER_SITE_OTHER): Payer: Medicare Other | Admitting: Orthopaedic Surgery

## 2018-06-22 ENCOUNTER — Ambulatory Visit (INDEPENDENT_AMBULATORY_CARE_PROVIDER_SITE_OTHER): Payer: Self-pay

## 2018-06-22 ENCOUNTER — Encounter (INDEPENDENT_AMBULATORY_CARE_PROVIDER_SITE_OTHER): Payer: Self-pay | Admitting: Orthopaedic Surgery

## 2018-06-22 DIAGNOSIS — M25551 Pain in right hip: Secondary | ICD-10-CM | POA: Diagnosis not present

## 2018-06-22 DIAGNOSIS — Z96641 Presence of right artificial hip joint: Secondary | ICD-10-CM

## 2018-06-22 NOTE — Progress Notes (Signed)
The patient is a 77 year old who is 10 to 11 months status post a right total hip arthroplasty.  She ablates with a rolling walker and is sickly for other reasons.  She is a longtime smoker as well and still smokes.  She says her right hip is doing well.  She does have leg weakness and likely a bad lumbar spine seeing how she ambulates hunched over.  On exam I can easily put her right hip and left hip through full range of motion with no pain at all.  Her leg lengths are equal.  Her incisions well-healed and she has no severe leg pain but she does have weakness on both legs equally.  An AP pelvis and lateral of the right hip show a total hip arthroplasty with no complicating features and no evidence of loosening.  Her left hip appears normal.  From my standpoint she will follow-up as needed for her hips.  I talked to her in detail about things with her back for her hips.  There is really nothing else that I would offer for her lumbar spine given her continued smoking.  She can always work on core strengthening exercises and therapy if needed.

## 2018-06-24 ENCOUNTER — Other Ambulatory Visit: Payer: Self-pay

## 2018-06-24 ENCOUNTER — Ambulatory Visit (INDEPENDENT_AMBULATORY_CARE_PROVIDER_SITE_OTHER)
Admission: RE | Admit: 2018-06-24 | Discharge: 2018-06-24 | Disposition: A | Discharge status: Home / Self Care | Payer: Medicare Other | Source: Ambulatory Visit | Attending: Nurse Practitioner | Admitting: Nurse Practitioner

## 2018-06-24 ENCOUNTER — Ambulatory Visit (HOSPITAL_COMMUNITY): Payer: Medicare Other | Attending: Cardiology

## 2018-06-24 DIAGNOSIS — E785 Hyperlipidemia, unspecified: Secondary | ICD-10-CM | POA: Insufficient documentation

## 2018-06-24 DIAGNOSIS — Z953 Presence of xenogenic heart valve: Secondary | ICD-10-CM | POA: Insufficient documentation

## 2018-06-24 DIAGNOSIS — I25810 Atherosclerosis of coronary artery bypass graft(s) without angina pectoris: Secondary | ICD-10-CM

## 2018-06-24 DIAGNOSIS — F17210 Nicotine dependence, cigarettes, uncomplicated: Secondary | ICD-10-CM | POA: Diagnosis not present

## 2018-06-24 DIAGNOSIS — I77810 Thoracic aortic ectasia: Secondary | ICD-10-CM | POA: Insufficient documentation

## 2018-06-24 DIAGNOSIS — Z952 Presence of prosthetic heart valve: Secondary | ICD-10-CM

## 2018-06-24 DIAGNOSIS — I10 Essential (primary) hypertension: Secondary | ICD-10-CM | POA: Diagnosis not present

## 2018-06-24 DIAGNOSIS — R2230 Localized swelling, mass and lump, unspecified upper limb: Secondary | ICD-10-CM

## 2018-06-24 DIAGNOSIS — R0602 Shortness of breath: Secondary | ICD-10-CM | POA: Insufficient documentation

## 2018-06-24 DIAGNOSIS — I253 Aneurysm of heart: Secondary | ICD-10-CM | POA: Diagnosis not present

## 2018-06-24 DIAGNOSIS — I252 Old myocardial infarction: Secondary | ICD-10-CM | POA: Diagnosis not present

## 2018-06-24 DIAGNOSIS — R222 Localized swelling, mass and lump, trunk: Secondary | ICD-10-CM

## 2018-06-24 MED ORDER — IOPAMIDOL (ISOVUE-300) INJECTION 61%
80.0000 mL | Freq: Once | INTRAVENOUS | Status: AC | PRN
  Administered 2018-06-24: 80 mL via INTRAVENOUS

## 2018-06-25 ENCOUNTER — Ambulatory Visit: Payer: Medicare Other | Admitting: Pulmonary Disease

## 2018-06-25 ENCOUNTER — Encounter: Payer: Self-pay | Admitting: Pulmonary Disease

## 2018-06-25 ENCOUNTER — Telehealth: Payer: Self-pay | Admitting: Nurse Practitioner

## 2018-06-25 VITALS — BP 122/70 | HR 74 | Ht 59.06 in | Wt 124.2 lb

## 2018-06-25 DIAGNOSIS — R222 Localized swelling, mass and lump, trunk: Secondary | ICD-10-CM | POA: Diagnosis not present

## 2018-06-25 DIAGNOSIS — Z122 Encounter for screening for malignant neoplasm of respiratory organs: Secondary | ICD-10-CM | POA: Diagnosis not present

## 2018-06-25 DIAGNOSIS — F17210 Nicotine dependence, cigarettes, uncomplicated: Secondary | ICD-10-CM | POA: Diagnosis not present

## 2018-06-25 MED ORDER — TIOTROPIUM BROMIDE MONOHYDRATE 18 MCG IN CAPS: 18.0000 ug | ORAL_CAPSULE | Freq: Every morning | RESPIRATORY_TRACT | 12 refills | Status: DC

## 2018-06-25 NOTE — Progress Notes (Signed)
Subjective:    Patient ID: [name], female    DOB: 1941/03/14, 77 y.o.   MRN: [id number]   Synopsis: Refer to [place] [facility] in 2016 for evaluation of shortness of breath. Pulmonary function testing showed an isolated diffusion defect in September 2016, July 2016 echocardiogram suggested pulmonary hypertension.  She still smokes as of 07/2017.  She also has a history of aortic valve repair.    HPI Chief Complaint  Patient presents with  . Follow-up    issues with breathing at night when laying down, smoking 1/2 ppd   [name] comes to clinic today for evaluation of worsening shortness of breath and cough when she lays flat.  She says that she no longer has Spiriva at home.  She was referred to me today because a CT scan of her chest performed yesterday showed a large chest wall mass.  She says that for the last 3 months she has noticed an increasing growth on her chest.  She was not sure what was causing this.  The CT scan was performed yesterday.  She says that she ran out of Spiriva recently.   Past Medical History:  Diagnosis Date  . Abdominal pain, unspecified site   . Anemia    Iron deficiency anemia  . Anxiety   . Aortic stenosis   . Arthritis   . CAD (coronary artery disease)    psot bypass x2 to diagonal and obtuse marginal. SVG graft-01/08/2009  . Depression   . Dyspnea   . GERD (gastroesophageal reflux disease)   . History of cardiovascular stress test    Myoview 7/16:  Low risk, no ischemia or scar, EF 72%  . History of echocardiogram    Echo 7/16:  EF 55-60%, no RWMA, Gr 2 DD, AVR ok (mean 20 mmHg), PASP 40 mmHg  . History of GI bleed    With AVM  . Hypercholesterolemia   . Hyperlipidemia   . Hypertension   . Iron deficiency anemia   . Myocardial infarction ([place]) 2010  . Neuromuscular disorder (HCC)    numbness in hands  . Peripheral vascular disease ([place])    righ tleg  . Shoulder impingement syndrome    Right shoulder      Review of Systems   Constitutional: Negative for chills, fatigue and fever.  HENT: Negative for postnasal drip, rhinorrhea and sinus pressure.   Respiratory: Positive for shortness of breath. Negative for cough and wheezing.   Cardiovascular: Positive for leg swelling. Negative for chest pain and palpitations.       Objective:   Physical Exam Vitals:   06/25/18 1048  BP: 122/70  Pulse: 74  SpO2: 100%  Weight: 124 lb 3.2 oz (56.3 kg)  Height: 4' 11.06" (1.5 m)    Gen: chronically ill appearing HENT: OP clear, TM's clear, neck supple PULM: Wheezing bilaterally B, normal percussion CV: RRR, no mgr, trace edema GI: BS+, soft, nontender Derm: no cyanosis or rash Psyche: normal mood and affect   Chest imaging: 2017 Screening CT chest images reviewed: s/p aortic valve replacement, RADS2, perhaps mild centrilobular emphysema, likely PAH, soft tissue swelling, perhaps peri-aortic lymph node near enlarged ascending aora October 2019 CT chest showed a large mass encompassing the a sending aorta extrinsic narrowing and possible direct invasion of the left brachiocephalic vein and upper superior vena cava, new bilateral mediastinal adenopathy.  New multiple pulmonary nodules in both lungs, adrenal nodule noted images independently reviewed  Echo: July 2016 echocardiogram shows slightly elevated PA  pressure, upper limit of normal aortic valve gradient  Labs: July 2016 hemoglobin 14.4  Right Heart Catheterization 08/11/15  RHC RA = 7 RV = 49/0/10 PA = 53/14 (29) PCW = 9 Fick cardiac output/index = 3.2/1.9 PVR = 6.4 WU Ao sat = 96% PA sat = 56%, 57%   Nuclear stress test: Myoview 03/2015  Myoview Study Highlights 03/2015   Nuclear stress EF: 72%.  There was no ST segment deviation noted during stress.  The study is normal.  This is a low risk study.  The left ventricular ejection fraction is hyperdynamic (>65%).  Low risk study Normal resting and stress perfusion with no ischemia or  infarction EF 72%    PFT: 03/2017 Ratio 85% FEV 0.9L (69% pred), FVC 1.06 L (62% pred), TLC: 2.55L (62% pred), ERV -78%?; couldn't perform DLCO  Sleep study: 07/2017 AHI 20.4, SaO2 80%  Records from her June 16, 2018 visit with cardiology reviewed where she was noted to have a chest wall mass and a CT scan of her chest was ordered.  She was also referred to [place] for worsening shortness of breath.      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  I am very worried that this either represents a germ cell tumor, thymoma or carcinoid as radiology has discussed or also probably with equal likelihood of primary lung cancer.  She continues to smoke cigarettes.  Plan: Large mass growing in your chest: I am very concerned that this is cancer You need to have a biopsy to figure this out We will arrange for a biopsy as soon as possible and then bring you back in 7 to 10 days to go over the results  COPD: Continue Spiriva, we will refill today  We will see you back in 7 to 10 days to go over the results of the biopsy   Current Outpatient Medications:  .  acetaminophen (TYLENOL) 325 MG tablet, Take 2 tablets (650 mg total) by mouth every 6 (six) hours as needed for mild pain (or Fever >/= 101)., Disp: , Rfl:  .  atorvastatin (LIPITOR) 80 MG tablet, TAKE 1 TABLET BY MOUTH EVERY DAY, Disp: 30 tablet, Rfl: 12 .  BYSTOLIC 5 MG tablet, TAKE 1 TABLET BY MOUTH EVERY DAY, Disp: 90 tablet, Rfl: 3 .  Calcium Carbonate-Vitamin D (CALCIUM 600+D) 600-400 MG-UNIT per tablet, Take 1 tablet by mouth daily., Disp: , Rfl:  .  ferrous sulfate 325 (65 FE) MG tablet, Take 1 tablet (325 mg total) by mouth 2 (two) times daily with a meal., Disp: 60 tablet, Rfl: 0 .   furosemide (LASIX) 20 MG tablet, Take 20 mg daily by mouth. , Disp: , Rfl:  .  glycopyrrolate (ROBINUL) 2 MG tablet, Take 2 mg by mouth 2 (two) times daily., Disp: , Rfl: 1 .  Multiple Vitamin (MULTIVITAMIN) tablet, Take 1 tablet by mouth daily., Disp: , Rfl:  .  omeprazole (PRILOSEC) 40 MG capsule, Take 1 capsule (40 mg total) by mouth 2 (two) times daily., Disp: 60 capsule, Rfl: 0 .  potassium chloride (MICRO-K) 10 MEQ CR capsule, TAKE 2 CAPSULES BY MOUTH TWICE A DAY, Disp: 120 capsule, Rfl: 9 .  sertraline (ZOLOFT) 100 MG tablet, TAKE 2 TABLETS (200MG ) BY  MOUTH ONCE A DAY, Disp: , Rfl: 1 .  tiotropium (SPIRIVA HANDIHALER) 18 MCG inhalation capsule, Place 1 capsule (18 mcg total) into inhaler and inhale every morning., Disp: 30 capsule, Rfl: 12  Current Facility-Administered Medications:  .  octreotide (SANDOSTATIN LAR) IM injection 20 mg, 20 mg, Intramuscular, Q30 days, Milus Banister, MD .  octreotide (SANDOSTATIN LAR) IM injection 20 mg, 20 mg, Intramuscular, Q30 days, Milus Banister, MD

## 2018-06-25 NOTE — Telephone Encounter (Signed)
° °  [facility] calling to give CT scan results. Please call. Any representative can assist.

## 2018-06-25 NOTE — Patient Instructions (Signed)
Large mass growing in your chest: I am very concerned that this is cancer You need to have a biopsy to figure this out We will arrange for a biopsy as soon as possible and then bring you back in 7 to 10 days to go over the results  COPD: Continue Spiriva, we will refill today  We will see you back in 7 to 10 days to go over the results of the biopsy

## 2018-06-25 NOTE — Telephone Encounter (Signed)
S/w Valarie Merino from [facility] imaging.  Resulted pt's CT scan.  S/w Cecille Rubin already taking measures for results from Ct scan.  Pt sees Dr.Mcqaid today at 10:45 and it is to be resulted at ov.  S/w pt's daughter, Verdene Lennert,  per [facility]) is aware for pt to make sure appt is kept for Dr.McQuaid.

## 2018-07-02 ENCOUNTER — Other Ambulatory Visit (INDEPENDENT_AMBULATORY_CARE_PROVIDER_SITE_OTHER): Payer: Self-pay | Admitting: Orthopaedic Surgery

## 2018-07-02 ENCOUNTER — Telehealth (INDEPENDENT_AMBULATORY_CARE_PROVIDER_SITE_OTHER): Payer: Self-pay | Admitting: Orthopaedic Surgery

## 2018-07-02 MED ORDER — ACETAMINOPHEN-CODEINE #3 300-30 MG PO TABS: 1.0000 | ORAL_TABLET | Freq: Three times a day (TID) | ORAL | 0 refills | Status: DC | PRN

## 2018-07-02 NOTE — Telephone Encounter (Signed)
I sent in some Tylenol 3 to her pharmacy.

## 2018-07-02 NOTE — Telephone Encounter (Signed)
Please advise 

## 2018-07-02 NOTE — Telephone Encounter (Signed)
Patient called requesting an RX to be called in for pain.  She stated that her leg is bothering her.  CB#[phone].  Thank you

## 2018-07-03 ENCOUNTER — Other Ambulatory Visit: Payer: Self-pay

## 2018-07-03 DIAGNOSIS — D509 Iron deficiency anemia, unspecified: Secondary | ICD-10-CM

## 2018-07-03 DIAGNOSIS — K5521 Angiodysplasia of colon with hemorrhage: Secondary | ICD-10-CM

## 2018-07-09 ENCOUNTER — Encounter (HOSPITAL_COMMUNITY): Payer: Self-pay

## 2018-07-09 ENCOUNTER — Encounter (HOSPITAL_COMMUNITY)
Admission: RE | Admit: 2018-07-09 | Discharge: 2018-07-09 | Disposition: A | Discharge status: Home / Self Care | Payer: Medicare Other | Source: Ambulatory Visit | Attending: Gastroenterology | Admitting: Gastroenterology

## 2018-07-09 ENCOUNTER — Ambulatory Visit: Payer: Medicare Other | Admitting: Nurse Practitioner

## 2018-07-09 DIAGNOSIS — D509 Iron deficiency anemia, unspecified: Secondary | ICD-10-CM

## 2018-07-09 DIAGNOSIS — K5521 Angiodysplasia of colon with hemorrhage: Secondary | ICD-10-CM | POA: Insufficient documentation

## 2018-07-09 DIAGNOSIS — D500 Iron deficiency anemia secondary to blood loss (chronic): Secondary | ICD-10-CM | POA: Diagnosis present

## 2018-07-09 DIAGNOSIS — K922 Gastrointestinal hemorrhage, unspecified: Secondary | ICD-10-CM | POA: Insufficient documentation

## 2018-07-09 MED ORDER — OCTREOTIDE ACETATE 20 MG IM KIT
20.0000 mg | PACK | INTRAMUSCULAR | Status: DC
  Administered 2018-07-09: 20 mg via INTRAMUSCULAR
  Filled 2018-07-09: qty 1

## 2018-07-10 ENCOUNTER — Other Ambulatory Visit: Payer: Self-pay | Admitting: Radiology

## 2018-07-13 ENCOUNTER — Ambulatory Visit (HOSPITAL_COMMUNITY): Payer: Medicare Other

## 2018-07-13 ENCOUNTER — Telehealth: Payer: Self-pay | Admitting: Pulmonary Disease

## 2018-07-13 ENCOUNTER — Encounter (HOSPITAL_COMMUNITY): Payer: Self-pay

## 2018-07-13 NOTE — Telephone Encounter (Signed)
Called and spoke to [place] with IR, who stated pt no showed CT bx today.  lmtcb x1 for pt on both mobile and home number on file.   Routing to BQ as an [place].

## 2018-07-13 NOTE — Telephone Encounter (Signed)
Called pt and spoke with her daughter [name] to see if I could figure out why pt did not show up for the CT biopsy.  Per [name], she had the date written down for pt to be at [facility] 10/17 and did not have anything written down for pt from 10/21. Due to [name] not having anything written down in regards to an appt for pt today, that was why they did not show up for the CT biopsy.  [name] stated if there are any appts for pt that she needs to be the one whom is contacted in regards to them as she brings her mother to the appts.  She can be reached at [phone]. Per [name], please see if we can reschedule the CT biopsy with IR.  Attempted to call Tiffany with IR but unable to reach her. Left a detailed message for her to return call.

## 2018-07-13 NOTE — Telephone Encounter (Signed)
Please call her to ask what happened and to see if we can reschedule

## 2018-07-14 NOTE — Telephone Encounter (Signed)
Called Tiffany with IR letting her know that BQ wants to have the CT biopsy rescheduled for pt. I stated to her that her daughter [name] needs to be the one that is contacted to get this rescheduled.  Jonelle Sidle will pass the information along to Kenney Houseman so she can get this rescheduled for pt.

## 2018-07-14 NOTE — Telephone Encounter (Signed)
tiffiany returning call said she spoke to [facility] yesterday and she is returning call can be reached @ [phone].[name]

## 2018-07-15 ENCOUNTER — Encounter (HOSPITAL_COMMUNITY): Payer: Medicare Other

## 2018-07-17 ENCOUNTER — Other Ambulatory Visit: Payer: Self-pay | Admitting: Student

## 2018-07-20 ENCOUNTER — Encounter (HOSPITAL_COMMUNITY): Payer: Self-pay

## 2018-07-20 ENCOUNTER — Ambulatory Visit (HOSPITAL_COMMUNITY)
Admission: RE | Admit: 2018-07-20 | Discharge: 2018-07-20 | Disposition: A | Discharge status: Home / Self Care | Payer: Medicare Other | Source: Ambulatory Visit | Attending: Pulmonary Disease | Admitting: Pulmonary Disease

## 2018-07-20 DIAGNOSIS — K5521 Angiodysplasia of colon with hemorrhage: Secondary | ICD-10-CM

## 2018-07-20 DIAGNOSIS — R222 Localized swelling, mass and lump, trunk: Secondary | ICD-10-CM | POA: Diagnosis present

## 2018-07-20 DIAGNOSIS — D509 Iron deficiency anemia, unspecified: Secondary | ICD-10-CM

## 2018-07-20 DIAGNOSIS — C44599 Other specified malignant neoplasm of skin of other part of trunk: Secondary | ICD-10-CM | POA: Diagnosis not present

## 2018-07-20 LAB — CBC
HEMATOCRIT: 36.4 % (ref 36.0–46.0)
HEMOGLOBIN: 10.7 g/dL — AB (ref 12.0–15.0)
MCH: 29.3 pg (ref 26.0–34.0)
MCHC: 29.4 g/dL — AB (ref 30.0–36.0)
MCV: 99.7 fL (ref 80.0–100.0)
Platelets: 153 10*3/uL (ref 150–400)
RBC: 3.65 MIL/uL — ABNORMAL LOW (ref 3.87–5.11)
RDW: 15.1 % (ref 11.5–15.5)
WBC: 5 10*3/uL (ref 4.0–10.5)
nRBC: 0 % (ref 0.0–0.2)

## 2018-07-20 LAB — APTT: aPTT: 31 seconds (ref 24–36)

## 2018-07-20 LAB — PROTIME-INR
INR: 1.13
Prothrombin Time: 14.4 seconds (ref 11.4–15.2)

## 2018-07-20 MED ORDER — NALOXONE HCL 0.4 MG/ML IJ SOLN
INTRAMUSCULAR | Status: AC
  Filled 2018-07-20: qty 1

## 2018-07-20 MED ORDER — MIDAZOLAM HCL 2 MG/2ML IJ SOLN
INTRAMUSCULAR | Status: AC | PRN
  Administered 2018-07-20: 0.5 mg via INTRAVENOUS
  Administered 2018-07-20: 1 mg via INTRAVENOUS

## 2018-07-20 MED ORDER — FENTANYL CITRATE (PF) 100 MCG/2ML IJ SOLN
INTRAMUSCULAR | Status: AC | PRN
  Administered 2018-07-20 (×2): 25 ug via INTRAVENOUS

## 2018-07-20 MED ORDER — OCTREOTIDE ACETATE 20 MG IM KIT: 20.0000 mg | PACK | INTRAMUSCULAR | Status: DC

## 2018-07-20 MED ORDER — FENTANYL CITRATE (PF) 100 MCG/2ML IJ SOLN
INTRAMUSCULAR | Status: AC
  Filled 2018-07-20: qty 4

## 2018-07-20 MED ORDER — MIDAZOLAM HCL 2 MG/2ML IJ SOLN
INTRAMUSCULAR | Status: AC
  Filled 2018-07-20: qty 4

## 2018-07-20 MED ORDER — SODIUM CHLORIDE 0.9 % IV SOLN: INTRAVENOUS | Status: DC

## 2018-07-20 MED ORDER — FLUMAZENIL 0.5 MG/5ML IV SOLN
INTRAVENOUS | Status: AC
  Filled 2018-07-20: qty 5

## 2018-07-20 NOTE — Discharge Instructions (Signed)
Moderate Conscious Sedation, Adult, Care After These instructions provide you with information about caring for yourself after your procedure. Your health care provider may also give you more specific instructions. Your treatment has been planned according to current medical practices, but problems sometimes occur. Call your health care provider if you have any problems or questions after your procedure. What can I expect after the procedure? After your procedure, it is common:  To feel sleepy for several hours.  To feel clumsy and have poor balance for several hours.  To have poor judgment for several hours.  To vomit if you eat too soon.  Follow these instructions at home: For at least 24 hours after the procedure:   Do not: ? Participate in activities where you could fall or become injured. ? Drive. ? Use heavy machinery. ? Drink alcohol. ? Take sleeping pills or medicines that cause drowsiness. ? Make important decisions or sign legal documents. ? Take care of children on your own.  Rest. Eating and drinking  Follow the diet recommended by your health care provider.  If you vomit: ? Drink water, juice, or soup when you can drink without vomiting. ? Make sure you have little or no nausea before eating solid foods. General instructions  Have a responsible adult stay with you until you are awake and alert.  Take over-the-counter and prescription medicines only as told by your health care provider.  If you smoke, do not smoke without supervision.  Keep all follow-up visits as told by your health care provider. This is important. Contact a health care provider if:  You keep feeling nauseous or you keep vomiting.  You feel light-headed.  You develop a rash.  You have a fever. Get help right away if:  You have trouble breathing. This information is not intended to replace advice given to you by your health care provider. Make sure you discuss any questions you have  with your health care provider. Document Released: 06/30/2013 Document Revised: 02/12/2016 Document Reviewed: 12/30/2015 Elsevier Interactive Patient Education  2018 [employer].   Needle Biopsy, Care After These instructions give you information about caring for yourself after your procedure. Your doctor may also give you more specific instructions. Call your doctor if you have any problems or questions after your procedure. Follow these instructions at home:  Rest as told by your doctor.  Take medicines only as told by your doctor.  There are many different ways to close and cover the biopsy site, including stitches (sutures), skin glue, and adhesive strips. Follow instructions from your doctor about: ? How to take care of your biopsy site. ? When and how you should change your bandage (dressing). ? When you should remove your dressing.  You may remove your dressing tomorrow.  You may shower tomorrow. ? Removing whatever was used to close your biopsy site.  Check your biopsy site every day for signs of infection. Watch for: ? Redness, swelling, or pain. ? Fluid, blood, or pus. Contact a doctor if:  You have a fever.  You have redness, swelling, or pain at the biopsy site, and it lasts longer than a few days.  You have fluid, blood, or pus coming from the biopsy site.  You feel sick to your stomach (nauseous).  You throw up (vomit). Get help right away if:  You are short of breath.  You have trouble breathing.  Your chest hurts.  You feel dizzy or you pass out (faint).  You have bleeding that does  not stop with pressure or a bandage. °· You cough up blood. °· Your belly (abdomen) hurts. °This information is not intended to replace advice given to you by your health care provider. Make sure you discuss any questions you have with your health care provider. °Document Released: 08/22/2008 Document Revised: 02/15/2016 Document Reviewed: 09/05/2014 °Elsevier Interactive  Patient Education © 2018 Elsevier Inc. ° °

## 2018-07-20 NOTE — Consult Note (Signed)
Chief Complaint: Patient was seen in consultation today for image guided anterior chest wall mass biopsy  Referring Physician(s): Camden B  Supervising Physician: Jacqulynn Cadet  Patient Status: [facility] - Out-pt  History of Present Illness: [name] is a 77 y.o. female smoker with COPD and localized swelling of mid chest for last 3 to 4 months along with dyspnea.  Recent CT chest on 06/24/2018 revealed: 1. 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 The subcutaneous midline ventral chest wall portion of the mass should be amenable to ultrasound-guided core biopsy. 2. Extrinsic narrowing and possible direct invasion of the left brachiocephalic vein and upper SVC by the mass. Encasement of the right coronary artery by the mass. 3. New metastatic bilateral mediastinal adenopathy. 4. 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 5. New left adrenal nodule suspicious for adrenal metastasis. 6. Trace dependent left pleural effusion. 7. Stable ectatic 4.3 cm ascending thoracic aorta Aortic Atherosclerosis (ICD10-I70.0) and Emphysema (ICD10-J43.9).  She presents today for image guided anterior chest wall mass biopsy for further evaluation.  Additional medical history as listed below. Past Medical History:  Diagnosis Date  . Abdominal pain, unspecified site   . Anemia    Iron deficiency anemia  . Anxiety   . Aortic stenosis   . Arthritis   . CAD (coronary artery disease)    psot bypass x2 to diagonal and obtuse marginal. SVG graft-01/08/2009  . Depression   . Dyspnea   . GERD (gastroesophageal reflux disease)   . History of cardiovascular stress test    Myoview 7/16:  Low risk, no ischemia or scar, EF 72%  . History of  echocardiogram    Echo 7/16:  EF 55-60%, no RWMA, Gr 2 DD, AVR ok (mean 20 mmHg), PASP 40 mmHg  . History of GI bleed    With AVM  . Hypercholesterolemia   . Hyperlipidemia   . Hypertension   . Iron deficiency anemia   . Myocardial infarction ([place]) 2010  . Neuromuscular disorder (H[facility])    numbness in hands  . Peripheral vascular disease ([place])    righ tleg  . Shoulder impingement syndrome    Right shoulder    Past Surgical History:  Procedure Laterality Date  . ABDOMINAL HYSTERECTOMY    . AORTIC VALVE REPLACEMENT     [facility] #21  . CARDIAC CATHETERIZATION N/A 08/11/2015   Procedure: Right Heart Cath;  Surgeon: Jolaine Artist, MD;  Location: [facility];  Service: Cardiovascular;  Laterality: N/A;  . CARDIAC SURGERY  2010   , 2 blockages  . CHOLECYSTECTOMY    . COLONOSCOPY N/A 04/12/2017   Procedure: COLONOSCOPY;  Surgeon: Manus Gunning, MD;  Location: [facility] ENDOSCOPY;  Service: Gastroenterology;  Laterality: N/A;  . CORONARY ARTERY BYPASS GRAFT    . ENTEROSCOPY N/A 10/01/2017   Procedure: ENTEROSCOPY;  Surgeon: Irene Shipper, MD;  Location[facility] END[facility]COPY;  Service: Endoscopy;  Laterality: N/A;  . ESOPHAGOGASTRODUODENOSCOPY N/A 04/12/2017   Procedure: ESOPHAGOGASTRODUODENOSCOPY (EGD);  Surgeon: Manus Gunning, MD;  Location: [facility];  Service: Gastroenterology;  Laterality: N/A;  . GIVENS CAPSULE STUDY N/A 02/23/2018   Procedure: GIVENS CAPSULE STUDY;  Surgeon: Milus Banister, MD;  Location: [facility] ENDOSCOPY;  Service: Endoscopy;  Laterality: N/A;  . HEEL SPUR EXCISION  2000   left heel  .  SPINE SURGERY  2003   Herniated diskectomy  . TOTAL HIP ARTHROPLASTY Right 08/22/2017   Procedure: RIGHT TOTAL HIP ARTHROPLASTY ANTERIOR APPROACH;  Surgeon: Mcarthur Rossetti, MD;  Location: [facility];  Service: Orthopedics;  Laterality: Right;  . WRIST SURGERY     right wrist    Allergies: Codeine  Medications: Prior to Admission medications   Medication  Sig Start Date End Date Taking? Authorizing Provider  acetaminophen (TYLENOL) 325 MG tablet Take 2 tablets (650 mg total) by mouth every 6 (six) hours as needed for mild pain (or Fever >/= 101). 04/12/17  Yes Rosita Fire, MD  atorvastatin (LIPITOR) 80 MG tablet TAKE 1 TABLET BY MOUTH EVERY DAY 08/06/17  Yes Jerline Pain, MD  BYSTOLIC 5 MG tablet TAKE 1 TABLET BY MOUTH EVERY DAY 03/31/18  Yes Jerline Pain, MD  Calcium Carbonate-Vitamin D (CALCIUM 600+D) 600-400 MG-UNIT per tablet Take 1 tablet by mouth daily.   Yes [provider]  ferrous sulfate 325 (65 FE) MG tablet Take 1 tablet (325 mg total) by mouth 2 (two) times daily with a meal. 10/03/17  Yes Rosita Fire, MD  Multiple Vitamin (MULTIVITAMIN) tablet Take 1 tablet by mouth daily.   Yes [provider]  omeprazole (PRILOSEC) 40 MG capsule Take 1 capsule (40 mg total) by mouth 2 (two) times daily. 04/12/17  Yes Rosita Fire, MD  potassium chloride (MICRO-K) 10 MEQ CR capsule TAKE 2 CAPSULES BY MOUTH TWICE A DAY 09/15/17  Yes Jerline Pain, MD  acetaminophen-codeine (TYLENOL #3) 300-30 MG tablet Take 1-2 tablets by mouth every 8 (eight) hours as needed for moderate pain. 07/02/18   Mcarthur Rossetti, MD  furosemide (LASIX) 20 MG tablet Take 20 mg daily by mouth.     [provider]  glycopyrrolate (ROBINUL) 2 MG tablet Take 2 mg by mouth 2 (two) times daily. 12/09/14   [provider]  sertraline (ZOLOFT) 100 MG tablet TAKE 2 TABLETS (200MG ) BY MOUTH ONCE A DAY 10/30/15   [provider]  tiotropium (SPIRIVA HANDIHALER) 18 MCG inhalation capsule Place 1 capsule (18 mcg total) into inhaler and inhale every morning. 06/25/18   Juanito Doom, MD     Family History  Problem Relation Age of [facility]  . Hypertension Mother   . Kidney disease Mother   . Hypertension Father   . Hypertension Sister   . Hypertension Brother   . Hypertension Daughter   . Diabetes Brother     . Heart attack Neg Hx   . Stroke Neg Hx     Social History   Socioeconomic History  . Marital status: Single    Spouse name: Not on file  . Number of children: 3  . Years of education: Not on file  . Highest education level: Not on file  Occupational History  . Occupation: Retired  Scientific laboratory technician  . Financial resource strain: Not on file  . Food insecurity:    Worry: Not on file    Inability: Not on file  . Transportation needs:    Medical: Not on file    Non-medical: Not on file  Tobacco Use  . Smoking status: Current Every Day Smoker    Packs/day: 1.00    Years: 52.00    Pack years: 52.00    Types: Cigarettes  . Smokeless tobacco: Never Used  . Tobacco comment: smokes 2-3 cigarettes per day  Substance and Sexual Activity  . Alcohol use: No  Alcohol/week: 0.0 standard drinks  . Drug use: No  . Sexual activity: Not Currently  Lifestyle  . Physical activity:    Days per week: Not on file    Minutes per session: Not on file  . Stress: Not on file  Relationships  . Social connections:    Talks on phone: Not on file    Gets together: Not on file    Attends religious service: Not on file    Active member of club or organization: Not on file    Attends meetings of clubs or organizations: Not on file    Relationship status: Not on file  Other Topics Concern  . Not on file  Social History Narrative  . Not on file      Review of Systems denies fever, abdominal pain, vomiting or bleeding.  She does have occasional headaches, chest discomfort, dyspnea, occasional cough, back pain, occasional nausea  Vital Signs: BP 124/84 (BP Location: Right Arm)   Pulse 69   Temp 97.6 F (36.4 C) (Oral)   Resp 16   Ht 4\' 9"  (1.448 m)   Wt 123 lb 14.4 oz (56.2 kg)   LMP  (LMP Unknown)   SpO2 94%   BMI 26.81 kg/m   Physical Exam awake, alert.  Chest with slightly diminished breath sounds left base, right clear.  Palpable anterior chest wall mass, tender to palpation; heart  with regular rate and rhythm.  Soft, positive bowel sounds, nontender.  Bilateral lower extremity edema noted  Imaging: Ct Chest W Contrast  Result Date: 06/25/2018 CLINICAL DATA:  Localized swelling of the mid chest for few months. Dyspnea. EXAM: CT CHEST WITH CONTRAST TECHNIQUE: Multidetector CT imaging of the chest was performed during intravenous contrast administration. CONTRAST:  62mL ISOVUE-300 IOPAMIDOL (ISOVUE-300) INJECTION 61% COMPARISON:  06/28/2016 chest CT. 04/09/2017 chest radiograph. FINDINGS: Cardiovascular: Normal heart size. No significant pericardial effusion/thickening. Left anterior descending and right coronary atherosclerosis status post CABG. Atherosclerotic thoracic aorta with stable ectatic 4.3 cm ascending thoracic aorta. Aortic valve prosthesis is in place. Stable borderline dilated main pulmonary artery (3.4 cm diameter). No central pulmonary emboli. Mediastinum/Nodes: There is a large infiltrative 13.1 x 7.9 cm mass centered in anterior mediastinum (series 2/image 46), which demonstrates invasion of the anterior pericardium with encasement of the right coronary artery and invasion of the anterior chest wall with encasement of the sternum and extension to the superficial subcutaneous midline chest wall. There is moderate extrinsic narrowing and possibly direct invasion by the mass of the left brachiocephalic vein and upper SVC, which remain patent. This mass may represent marked growth of the previously visualized 1.5 x 0.9 cm soft tissue mass in the anterior mediastinum on the 06/28/2016 screening chest CT. No discrete thyroid nodules. Unremarkable esophagus. No axillary adenopathy. New enlarged 2.3 cm right paratracheal node (series 2/image 38). New enlarged 2.3 cm high left prevascular node (series 2/image 24). Multiple enlarged right pericardiophrenic nodes up to 2.1 cm (series 2/image 82), new. No pathologically enlarged hilar nodes. Lungs/Pleura: No pneumothorax. Trace  dependent left pleural effusion. No right pleural effusion. No acute consolidative airspace disease. There are numerous (greater than 20) new solid pulmonary nodules scattered throughout both lungs. Representative 1.6 cm peripheral right middle lobe nodule (series 3/image 62). Representative 1.3 cm left lower lobe nodule (series 3/image 63). Representative 1.5 cm left upper lobe nodule (series 3/image 34). Mild centrilobular emphysema with diffuse bronchial wall thickening. Upper abdomen: Cholecystectomy. New 1.9 cm left adrenal nodule with density 63 HU. Partially  visualized simple 3.0 cm anterior upper left renal cyst. Partially visualized simple 5.1 cm upper right renal cyst. Musculoskeletal: No aggressive appearing focal osseous lesions. Marked thoracic spondylosis. Intact sternotomy wires. IMPRESSION: 1. 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 The subcutaneous midline ventral chest wall portion of the mass should be amenable to ultrasound-guided core biopsy. 2. Extrinsic narrowing and possible direct invasion of the left brachiocephalic vein and upper SVC by the mass. Encasement of the right coronary artery by the mass. 3. New metastatic bilateral mediastinal adenopathy. 4. 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 5. New left adrenal nodule suspicious for adrenal metastasis. 6. Trace dependent left pleural effusion. 7. Stable ectatic 4.3 cm ascending thoracic aorta. Aortic Atherosclerosis (ICD10-I70.0) and Emphysema (ICD10-J43.9). These results will be called to the ordering clinician or representative by the Radiologist Assistant, and communication documented in the PACS or zVision Dashboard. Electronically Signed   By: Ilona Sorrel M.D.   On: 06/25/2018 08:00   Xr Hip  Unilat W Or W/o Pelvis 1v Right  Result Date: 06/22/2018 An AP pelvis and lateral of the right hip shows a well-seated total hip arthroplasty with no complicating features.   Labs:  CBC: Recent Labs    03/20/18 1128 06/12/18 1435 06/16/18 1239 07/20/18 0740  WBC 5.3 6.6 6.6 5.0  HGB 9.7* 11.0* 11.4 10.7*  HCT 32.0* 33.0* 35.5 36.4  PLT 234.0 188.0 218 153    COAGS: Recent Labs    09/30/17 0135 07/20/18 0740  INR 1.38 1.13  APTT 28 31    BMP: Recent Labs    10/01/17 0332 10/02/17 0306 02/12/18 1244 02/12/18 1250 06/16/18 1239  NA 139 139 142 143 145*  K 3.1* 3.9 3.6 3.6 4.7  CL 111 111 109 107 107*  CO2 19* 19* 20*  --  21  GLUCOSE 74 72 101* 97 83  BUN 13 7 20 16  7*  CALCIUM 8.5* 8.4* 9.0  --  10.4*  CREATININE 0.69 0.54 0.75 0.70 0.60  GFRNONAA >60 >60 >60  --  88  GFRAA >60 >60 >60  --  102    LIVER FUNCTION TESTS: Recent Labs    09/29/17 2244 02/12/18 1244  BILITOT 0.4 0.4  AST 67* 25  ALT 46 14  ALKPHOS 72 75  PROT 6.1* 7.1  ALBUMIN 2.9* 3.2*    TUMOR MARKERS: No results for input(s): AFPTM, CEA, CA199, CHROMGRNA in the last 8760 hours.  Assessment and Plan: 77 y.o. female smoker with COPD and localized swelling of mid chest for last 3 to 4 months along with dyspnea.  Recent CT chest on 06/24/2018 revealed: 1. 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 The subcutaneous midline ventral chest wall portion of the mass should be amenable to ultrasound-guided core biopsy. 2. Extrinsic narrowing and possible direct invasion of the left brachiocephalic vein and upper SVC by the mass. Encasement of the right coronary artery by the mass. 3. New metastatic bilateral mediastinal adenopathy. 4. Numerous new pulmonary  metastases in both lungs. 5. New left adrenal nodule suspicious for adrenal metastasis. 6. Trace dependent left pleural effusion. 7. Stable ectatic 4.3 cm ascending thoracic aorta Aortic Atherosclerosis (ICD10-I70.0)  and Emphysema (ICD10-J43.9).  She presents today for image guided anterior chest wall mass biopsy for further evaluation.Risks and benefits discussed with the patient including, but not limited to bleeding, infection, damage to adjacent structures or low yield requiring additional tests.  All of the patient's questions were answered, patient is agreeable to proceed. Consent signed and in chart.     Thank you for this interesting consult.  I greatly enjoyed meeting [name] and look forward to participating in their care.  A copy of this report was sent to the requesting provider on this date.  Electronically Signed: D. Rowe Robert, PA-C 07/20/2018, 8:29 AM   I spent a total of 25 minutes  in face to face in clinical consultation, greater than 50% of which was counseling/coordinating care for image guided anterior chest wall mass biopsy

## 2018-07-22 ENCOUNTER — Telehealth: Payer: Self-pay | Admitting: Pulmonary Disease

## 2018-07-22 DIAGNOSIS — C3490 Malignant neoplasm of unspecified part of unspecified bronchus or lung: Secondary | ICD-10-CM

## 2018-07-22 NOTE — Telephone Encounter (Signed)
Referral to oncology placed.

## 2018-07-22 NOTE — Addendum Note (Signed)
Addended by: Dolores Lory on: 07/22/2018 05:11 PM   Modules accepted: Orders

## 2018-07-22 NOTE — Telephone Encounter (Signed)
I called Ms. [name] and her daughter [name] answer the phone so [name] and I chatted about the recent biopsy.  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  I explained this to [name] and explained that we need to have her referred to see an oncologist for the same.  [name] thank me for the information and she is expecting a phone call from the cancer center.  She says that she should be contacted for information regarding her mother's care and her cell phone number is [phone]

## 2018-07-23 ENCOUNTER — Telehealth: Payer: Self-pay | Admitting: *Deleted

## 2018-07-23 DIAGNOSIS — R918 Other nonspecific abnormal finding of lung field: Secondary | ICD-10-CM

## 2018-07-23 NOTE — Telephone Encounter (Signed)
Oncology Nurse Navigator Documentation  Oncology Nurse Navigator Flowsheets 07/23/2018  Navigator Location [facility]  Navigator Encounter Type Telephone/I received a call from patients daughter.  I called her back and update her on appt with Dr. Julien Nordmann and that [facility] will call with an appt.  She verbalized understanding of appts and referral to rad onc.   Telephone Incoming Call;Outgoing Call  Treatment Phase Pre-Tx/Tx Discussion  Barriers/Navigation Needs Education;Coordination of Care  Education Other  Interventions Coordination of Care;Education  Coordination of Care Appts  Education Method Verbal  Acuity Level 1  Time Spent with Patient 15

## 2018-07-23 NOTE — Telephone Encounter (Signed)
Oncology Nurse Navigator Documentation  Oncology Nurse Navigator Flowsheets 07/23/2018  Navigator Location [facility]  Referral date to RadOnc/MedOnc 07/23/2018  Navigator Encounter Type Telephone/I spoke with Dr. Julien Nordmann about referral.  He would like to see her next available appt on 07/31/18.  I called to schedule but was unable to reach.  I did leave a vm message for her to call with my name and phone number.   Telephone Outgoing Call  Abnormal Finding Date 06/25/2018  Confirmed Diagnosis Date 07/20/2018  Treatment Phase Pre-Tx/Tx Discussion  Barriers/Navigation Needs Education;Coordination of Care  Education Other  Interventions Coordination of Care;Education  Acuity Level 2  Time Spent with Patient 30

## 2018-07-27 ENCOUNTER — Encounter: Payer: Self-pay | Admitting: Pulmonary Disease

## 2018-07-27 ENCOUNTER — Ambulatory Visit (INDEPENDENT_AMBULATORY_CARE_PROVIDER_SITE_OTHER): Payer: Medicare Other | Admitting: Pulmonary Disease

## 2018-07-27 VITALS — BP 124/72 | HR 87 | Ht 58.78 in | Wt 132.0 lb

## 2018-07-27 DIAGNOSIS — R222 Localized swelling, mass and lump, trunk: Secondary | ICD-10-CM | POA: Diagnosis not present

## 2018-07-27 DIAGNOSIS — R0600 Dyspnea, unspecified: Secondary | ICD-10-CM | POA: Diagnosis not present

## 2018-07-27 DIAGNOSIS — C3490 Malignant neoplasm of unspecified part of unspecified bronchus or lung: Secondary | ICD-10-CM | POA: Diagnosis not present

## 2018-07-27 DIAGNOSIS — F17210 Nicotine dependence, cigarettes, uncomplicated: Secondary | ICD-10-CM | POA: Diagnosis not present

## 2018-07-27 DIAGNOSIS — J432 Centrilobular emphysema: Secondary | ICD-10-CM

## 2018-07-27 MED ORDER — HYDROCOD POLST-CPM POLST ER 10-8 MG/5ML PO SUER: 5.0000 mL | Freq: Two times a day (BID) | ORAL | 0 refills | Status: AC | PRN

## 2018-07-27 MED ORDER — TIOTROPIUM BROMIDE MONOHYDRATE 18 MCG IN CAPS: 18.0000 ug | ORAL_CAPSULE | Freq: Every morning | RESPIRATORY_TRACT | 12 refills | Status: AC

## 2018-07-27 MED ORDER — ALBUTEROL SULFATE (2.5 MG/3ML) 0.083% IN NEBU: 2.5000 mg | INHALATION_SOLUTION | Freq: Four times a day (QID) | RESPIRATORY_TRACT | 11 refills | Status: AC | PRN

## 2018-07-27 MED ORDER — ALBUTEROL SULFATE (2.5 MG/3ML) 0.083% IN NEBU: 2.5000 mg | INHALATION_SOLUTION | Freq: Four times a day (QID) | RESPIRATORY_TRACT | 12 refills | Status: DC | PRN

## 2018-07-27 NOTE — Addendum Note (Signed)
Addended by: Dolores Lory on: 07/27/2018 04:40 PM   Modules accepted: Orders

## 2018-07-27 NOTE — Addendum Note (Signed)
Addended by: Dolores Lory on: 07/27/2018 04:43 PM   Modules accepted: Orders

## 2018-07-27 NOTE — Patient Instructions (Signed)
Small cell lung cancer: Keep the appointment for oncology later this week They will likely want to perform a test called a PET scan and an MRI of your brain  COPD: Continue Spiriva daily Use albuterol as needed for chest tightness wheezing or shortness of breath, we will prescribe a nebulizer machine and solution of albuterol today I am glad your flu shot is up to date Practice good hand hygiene Stay active  Cigarette smoking: Stay away from cigarettes  Cough: Use Tussionex as needed for cough, this medicine contains a narcotic and can be sedating so do not take it with other sedating medicines and do not take it and drive.  We will see you back in 2 to 3 months or sooner if needed

## 2018-07-27 NOTE — Progress Notes (Signed)
Subjective:    Patient ID: [name], female    DOB: 04-09-1941, 77 y.o.   MRN: [id number]   Synopsis: Refer to [name] pulmonary in 2016 for evaluation of shortness of breath. Pulmonary function testing showed an isolated diffusion defect in September 2016, July 2016 echocardiogram suggested pulmonary hypertension.  She still smokes as of 07/2017.  She also has a history of aortic valve repair.  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  HPI Chief Complaint  Patient presents with  . Follow-up    reports breathing is same, tired and dizziness at times   [name] returns to clinic today to discuss the results of her biopsy.  Last week she had a transthoracic needle aspiration which showed evidence of small cell carcinoma.  She has been coughing, no mucus production.  Some shortness of breath which has worsened over the last several days.  She has not had problems with pneumonia or bronchitis since the last visit.  She continues to take Spiriva.  She does not have albuterol to use at home.  She and her daughter have noticed a mild uptake in facial swelling over the last several days.   Past Medical History:  Diagnosis Date  . Abdominal pain, unspecified site   . Anemia    Iron deficiency anemia  . Anxiety   . Aortic stenosis   . Arthritis   . CAD (coronary artery disease)    psot bypass x2 to diagonal and obtuse marginal. SVG graft-01/08/2009  . Depression   . Dyspnea   . GERD (gastroesophageal reflux disease)   . History of cardiovascular stress test    Myoview 7/16:  Low risk, no ischemia or scar, EF 72%  . History of echocardiogram    Echo 7/16:  EF 55-60%, no RWMA, Gr 2 DD, AVR ok (mean 20 mmHg), PASP 40 mmHg  . History of GI bleed    With AVM  . Hypercholesterolemia   . Hyperlipidemia   . Hypertension   . Iron deficiency anemia   . Myocardial infarction ([place]) 2010  . Neuromuscular disorder (HCC)    numbness in hands  . Peripheral vascular  disease ([place])    righ tleg  . Shoulder impingement syndrome    Right shoulder      Review of Systems  Constitutional: Negative for chills, fatigue and fever.  HENT: Negative for postnasal drip, rhinorrhea and sinus pressure.   Respiratory: Positive for shortness of breath. Negative for cough and wheezing.   Cardiovascular: Positive for leg swelling. Negative for chest pain and palpitations.       Objective:   Physical Exam Vitals:   07/27/18 1607  BP: 124/72  Pulse: 87  SpO2: 91%  Weight: 132 lb (59.9 kg)  Height: 4' 10.78" (1.493 m)    RA  Gen: chronically ill appearing HENT: OP clear, TM's clear, neck supple PULM: Wheezing bilaterally B, normal percussion CV: RRR, no mgr, trace edema GI: BS+, soft, nontender Derm: no cyanosis or rash Psyche: normal mood and affect   Chest imaging: 2017 Screening CT chest images reviewed: s/p aortic valve replacement, RADS2, perhaps mild centrilobular emphysema, likely PAH, soft tissue swelling, perhaps peri-aortic lymph node near enlarged ascending aora October 2019 CT chest showed a large mass encompassing the a sending aorta extrinsic narrowing and possible direct invasion of the left brachiocephalic vein and upper superior vena cava, new bilateral mediastinal adenopathy.  New multiple pulmonary nodules in both lungs, adrenal nodule noted images independently reviewed  Echo: July 2016 echocardiogram shows slightly elevated PA pressure, upper limit of normal aortic valve gradient  Labs: July 2016 hemoglobin 14.4  Right Heart Catheterization 08/11/15  RHC RA = 7 RV = 49/0/10 PA = 53/14 (29) PCW = 9 Fick cardiac output/index = 3.2/1.9 PVR = 6.4 WU Ao sat = 96% PA sat = 56%, 57%   Nuclear stress test: Myoview 03/2015  Myoview Study Highlights 03/2015   Nuclear stress EF: 72%.  There was no ST segment deviation noted during stress.  The study is normal.  This is a low risk study.  The left ventricular ejection  fraction is hyperdynamic (>65%).  Low risk study Normal resting and stress perfusion with no ischemia or infarction EF 72%    PFT: 03/2017 Ratio 85% FEV 0.9L (69% pred), FVC 1.06 L (62% pred), TLC: 2.55L (62% pred), ERV -78%?; couldn't perform DLCO  Sleep study: 07/2017 AHI 20.4, SaO2 80%  Path: October 2019 transthoracic needle aspiration of a large mediastinal mass showed small cell carcinoma       Assessment & Plan:   Dyspnea, unspecified type - Plan: Ambulatory Referral for DME  Small cell lung cancer ([place])  Chest wall mass  Moderate smoker (20 or less per day)  Centrilobular emphysema ([place])  Discussion: [name] was recently diagnosed with small cell lung cancer from a needle aspiration.  She is supposed to be seen by oncology this week and has an appointment for radiation next week.  Unfortunately she is showing early signs of SVC syndrome so I think it is appropriate that she is to start radiation therapy very soon.  Today her daughter was with her and had questions about the condition which we answered.  Plan: Small cell lung cancer: new problem Keep the appointment for oncology later this week They will likely want to perform a test called a PET scan and an MRI of your brain  COPD: Continue Spiriva daily Use albuterol as needed for chest tightness wheezing or shortness of breath, we will prescribe a nebulizer machine and solution of albuterol today I am glad your flu shot is up to date Practice good hand hygiene Stay active  Cigarette smoking: Stay away from cigarettes  Cough: Use Tussionex as needed for cough, this medicine contains a narcotic and can be sedating so do not take it with other sedating medicines and do not take it and drive.  We will see you back in 2 to 3 months or sooner if needed   Current Outpatient Medications:  .  acetaminophen (TYLENOL) 325 MG tablet, Take 2 tablets (650 mg total) by mouth every 6 (six) hours as needed for mild pain  (or Fever >/= 101)., Disp: , Rfl:  .  atorvastatin (LIPITOR) 80 MG tablet, TAKE 1 TABLET BY MOUTH EVERY DAY, Disp: 30 tablet, Rfl: 12 .  BYSTOLIC 5 MG tablet, TAKE 1 TABLET BY MOUTH EVERY DAY, Disp: 90 tablet, Rfl: 3 .  Calcium Carbonate-Vitamin D (CALCIUM 600+D) 600-400 MG-UNIT per tablet, Take 1 tablet by mouth daily., Disp: , Rfl:  .  ferrous sulfate 325 (65 FE) MG tablet, Take 1 tablet (325 mg total) by mouth 2 (two) times daily with a meal., Disp: 60 tablet, Rfl: 0 .  furosemide (LASIX) 20 MG tablet, Take 20 mg daily by mouth. , Disp: , Rfl:  .  glycopyrrolate (ROBINUL) 2 MG tablet, Take 2 mg by mouth 2 (two) times daily., Disp: , Rfl: 1 .  Multiple Vitamin (MULTIVITAMIN) tablet, Take 1 tablet by mouth  daily., Disp: , Rfl:  .  omeprazole (PRILOSEC) 40 MG capsule, Take 1 capsule (40 mg total) by mouth 2 (two) times daily., Disp: 60 capsule, Rfl: 0 .  potassium chloride (MICRO-K) 10 MEQ CR capsule, TAKE 2 CAPSULES BY MOUTH TWICE A DAY, Disp: 120 capsule, Rfl: 9 .  tiotropium (SPIRIVA HANDIHALER) 18 MCG inhalation capsule, Place 1 capsule (18 mcg total) into inhaler and inhale every morning., Disp: 30 capsule, Rfl: 12  Current Facility-Administered Medications:  .  octreotide (SANDOSTATIN LAR) IM injection 20 mg, 20 mg, Intramuscular, Q30 days, Milus Banister, MD .  octreotide (SANDOSTATIN LAR) IM injection 20 mg, 20 mg, Intramuscular, Q30 days, Milus Banister, MD

## 2018-07-28 ENCOUNTER — Telehealth: Payer: Self-pay | Admitting: Pulmonary Disease

## 2018-07-28 NOTE — Telephone Encounter (Signed)
Lmtcb x1 for [employer].

## 2018-07-30 ENCOUNTER — Encounter: Payer: Self-pay | Admitting: Radiation Oncology

## 2018-07-30 ENCOUNTER — Ambulatory Visit
Admission: RE | Admit: 2018-07-30 | Discharge: 2018-07-30 | Disposition: A | Discharge status: Home / Self Care | Payer: Medicare Other | Source: Ambulatory Visit | Attending: Radiation Oncology | Admitting: Radiation Oncology

## 2018-07-30 ENCOUNTER — Other Ambulatory Visit: Payer: Self-pay

## 2018-07-30 ENCOUNTER — Encounter: Payer: Self-pay | Admitting: *Deleted

## 2018-07-30 VITALS — BP 155/90 | Temp 98.1°F | Resp 24 | Ht <= 58 in | Wt 135.8 lb

## 2018-07-30 DIAGNOSIS — Z79899 Other long term (current) drug therapy: Secondary | ICD-10-CM | POA: Diagnosis not present

## 2018-07-30 DIAGNOSIS — C3411 Malignant neoplasm of upper lobe, right bronchus or lung: Secondary | ICD-10-CM | POA: Diagnosis present

## 2018-07-30 DIAGNOSIS — K922 Gastrointestinal hemorrhage, unspecified: Secondary | ICD-10-CM

## 2018-07-30 DIAGNOSIS — R918 Other nonspecific abnormal finding of lung field: Secondary | ICD-10-CM

## 2018-07-30 DIAGNOSIS — I2510 Atherosclerotic heart disease of native coronary artery without angina pectoris: Secondary | ICD-10-CM | POA: Diagnosis not present

## 2018-07-30 DIAGNOSIS — R600 Localized edema: Secondary | ICD-10-CM

## 2018-07-30 DIAGNOSIS — C3490 Malignant neoplasm of unspecified part of unspecified bronchus or lung: Secondary | ICD-10-CM

## 2018-07-30 DIAGNOSIS — I10 Essential (primary) hypertension: Secondary | ICD-10-CM | POA: Insufficient documentation

## 2018-07-30 DIAGNOSIS — C383 Malignant neoplasm of mediastinum, part unspecified: Secondary | ICD-10-CM

## 2018-07-30 DIAGNOSIS — Z952 Presence of prosthetic heart valve: Secondary | ICD-10-CM | POA: Insufficient documentation

## 2018-07-30 DIAGNOSIS — Z951 Presence of aortocoronary bypass graft: Secondary | ICD-10-CM | POA: Diagnosis not present

## 2018-07-30 DIAGNOSIS — R51 Headache: Secondary | ICD-10-CM | POA: Insufficient documentation

## 2018-07-30 DIAGNOSIS — I871 Compression of vein: Secondary | ICD-10-CM

## 2018-07-30 DIAGNOSIS — I739 Peripheral vascular disease, unspecified: Secondary | ICD-10-CM | POA: Diagnosis not present

## 2018-07-30 DIAGNOSIS — F17210 Nicotine dependence, cigarettes, uncomplicated: Secondary | ICD-10-CM | POA: Diagnosis not present

## 2018-07-30 DIAGNOSIS — Z885 Allergy status to narcotic agent status: Secondary | ICD-10-CM | POA: Diagnosis not present

## 2018-07-30 DIAGNOSIS — I252 Old myocardial infarction: Secondary | ICD-10-CM | POA: Insufficient documentation

## 2018-07-30 NOTE — Telephone Encounter (Signed)
Spoke with pharmacist, she just wanted to request a 30 day supply of the nebulizer. I ok'd for her to fill the RX for that amount. Nothing further is needed.

## 2018-07-31 ENCOUNTER — Encounter: Payer: Self-pay | Admitting: *Deleted

## 2018-07-31 ENCOUNTER — Telehealth: Payer: Self-pay | Admitting: Internal Medicine

## 2018-07-31 ENCOUNTER — Inpatient Hospital Stay: Payer: Medicare Other | Attending: Internal Medicine

## 2018-07-31 ENCOUNTER — Other Ambulatory Visit: Payer: Self-pay | Admitting: Urology

## 2018-07-31 ENCOUNTER — Inpatient Hospital Stay (HOSPITAL_BASED_OUTPATIENT_CLINIC_OR_DEPARTMENT_OTHER): Payer: Medicare Other | Admitting: Internal Medicine

## 2018-07-31 ENCOUNTER — Telehealth: Payer: Self-pay | Admitting: *Deleted

## 2018-07-31 ENCOUNTER — Ambulatory Visit (HOSPITAL_COMMUNITY)
Admission: RE | Admit: 2018-07-31 | Discharge: 2018-07-31 | Disposition: A | Discharge status: Home / Self Care | Payer: Medicare Other | Source: Ambulatory Visit | Attending: Radiation Oncology | Admitting: Radiation Oncology

## 2018-07-31 ENCOUNTER — Encounter: Payer: Self-pay | Admitting: Internal Medicine

## 2018-07-31 ENCOUNTER — Ambulatory Visit
Admission: RE | Admit: 2018-07-31 | Discharge: 2018-07-31 | Disposition: A | Discharge status: Home / Self Care | Payer: Medicare Other | Source: Ambulatory Visit | Attending: Radiation Oncology | Admitting: Radiation Oncology

## 2018-07-31 VITALS — BP 135/81 | HR 72 | Temp 97.5°F | Resp 20 | Ht <= 58 in | Wt 135.0 lb

## 2018-07-31 DIAGNOSIS — Z7189 Other specified counseling: Secondary | ICD-10-CM | POA: Insufficient documentation

## 2018-07-31 DIAGNOSIS — R6889 Other general symptoms and signs: Secondary | ICD-10-CM | POA: Insufficient documentation

## 2018-07-31 DIAGNOSIS — I871 Compression of vein: Secondary | ICD-10-CM | POA: Diagnosis not present

## 2018-07-31 DIAGNOSIS — R0602 Shortness of breath: Secondary | ICD-10-CM | POA: Diagnosis not present

## 2018-07-31 DIAGNOSIS — I2510 Atherosclerotic heart disease of native coronary artery without angina pectoris: Secondary | ICD-10-CM | POA: Diagnosis not present

## 2018-07-31 DIAGNOSIS — C7972 Secondary malignant neoplasm of left adrenal gland: Secondary | ICD-10-CM

## 2018-07-31 DIAGNOSIS — R4182 Altered mental status, unspecified: Secondary | ICD-10-CM | POA: Diagnosis not present

## 2018-07-31 DIAGNOSIS — R509 Fever, unspecified: Secondary | ICD-10-CM | POA: Insufficient documentation

## 2018-07-31 DIAGNOSIS — C3490 Malignant neoplasm of unspecified part of unspecified bronchus or lung: Secondary | ICD-10-CM | POA: Insufficient documentation

## 2018-07-31 DIAGNOSIS — Z5112 Encounter for antineoplastic immunotherapy: Secondary | ICD-10-CM

## 2018-07-31 DIAGNOSIS — R220 Localized swelling, mass and lump, head: Secondary | ICD-10-CM | POA: Diagnosis not present

## 2018-07-31 DIAGNOSIS — F17210 Nicotine dependence, cigarettes, uncomplicated: Secondary | ICD-10-CM | POA: Diagnosis not present

## 2018-07-31 DIAGNOSIS — I739 Peripheral vascular disease, unspecified: Secondary | ICD-10-CM | POA: Diagnosis not present

## 2018-07-31 DIAGNOSIS — Z79899 Other long term (current) drug therapy: Secondary | ICD-10-CM | POA: Diagnosis not present

## 2018-07-31 DIAGNOSIS — E785 Hyperlipidemia, unspecified: Secondary | ICD-10-CM | POA: Insufficient documentation

## 2018-07-31 DIAGNOSIS — R51 Headache: Secondary | ICD-10-CM | POA: Diagnosis not present

## 2018-07-31 DIAGNOSIS — R918 Other nonspecific abnormal finding of lung field: Secondary | ICD-10-CM

## 2018-07-31 DIAGNOSIS — F418 Other specified anxiety disorders: Secondary | ICD-10-CM | POA: Diagnosis not present

## 2018-07-31 DIAGNOSIS — K219 Gastro-esophageal reflux disease without esophagitis: Secondary | ICD-10-CM | POA: Insufficient documentation

## 2018-07-31 DIAGNOSIS — I252 Old myocardial infarction: Secondary | ICD-10-CM | POA: Insufficient documentation

## 2018-07-31 DIAGNOSIS — C7931 Secondary malignant neoplasm of brain: Secondary | ICD-10-CM | POA: Insufficient documentation

## 2018-07-31 DIAGNOSIS — C383 Malignant neoplasm of mediastinum, part unspecified: Secondary | ICD-10-CM | POA: Diagnosis not present

## 2018-07-31 DIAGNOSIS — R5383 Other fatigue: Secondary | ICD-10-CM | POA: Insufficient documentation

## 2018-07-31 DIAGNOSIS — Z8719 Personal history of other diseases of the digestive system: Secondary | ICD-10-CM | POA: Diagnosis not present

## 2018-07-31 DIAGNOSIS — R0789 Other chest pain: Secondary | ICD-10-CM | POA: Diagnosis not present

## 2018-07-31 DIAGNOSIS — R05 Cough: Secondary | ICD-10-CM | POA: Insufficient documentation

## 2018-07-31 DIAGNOSIS — C3492 Malignant neoplasm of unspecified part of left bronchus or lung: Secondary | ICD-10-CM | POA: Diagnosis present

## 2018-07-31 DIAGNOSIS — Z5111 Encounter for antineoplastic chemotherapy: Secondary | ICD-10-CM | POA: Diagnosis not present

## 2018-07-31 DIAGNOSIS — C3491 Malignant neoplasm of unspecified part of right bronchus or lung: Secondary | ICD-10-CM | POA: Diagnosis not present

## 2018-07-31 DIAGNOSIS — I10 Essential (primary) hypertension: Secondary | ICD-10-CM

## 2018-07-31 DIAGNOSIS — M7989 Other specified soft tissue disorders: Secondary | ICD-10-CM | POA: Diagnosis not present

## 2018-07-31 LAB — CMP (CANCER CENTER ONLY)
ALT: 10 U/L (ref 0–44)
ANION GAP: 12 (ref 5–15)
AST: 31 U/L (ref 15–41)
Albumin: 3.7 g/dL (ref 3.5–5.0)
Alkaline Phosphatase: 83 U/L (ref 38–126)
BILIRUBIN TOTAL: 0.5 mg/dL (ref 0.3–1.2)
BUN: 8 mg/dL (ref 8–23)
CHLORIDE: 110 mmol/L (ref 98–111)
CO2: 23 mmol/L (ref 22–32)
Calcium: 9.5 mg/dL (ref 8.9–10.3)
Creatinine: 0.77 mg/dL (ref 0.44–1.00)
GFR, Est AFR Am: >60 mL/min (ref >60)
Glucose, Bld: 117 mg/dL — ABNORMAL HIGH (ref 70–99)
POTASSIUM: 3.8 mmol/L (ref 3.5–5.1)
Sodium: 145 mmol/L (ref 135–145)
TOTAL PROTEIN: 7.8 g/dL (ref 6.5–8.1)

## 2018-07-31 LAB — CBC WITH DIFFERENTIAL (CANCER CENTER ONLY)
Abs Immature Granulocytes: 0.01 10*3/uL (ref 0.00–0.07)
BASOS PCT: 0 %
Basophils Absolute: 0 10*3/uL (ref 0.0–0.1)
EOS ABS: 0.1 10*3/uL (ref 0.0–0.5)
Eosinophils Relative: 1 %
HEMATOCRIT: 36.3 % (ref 36.0–46.0)
Hemoglobin: 10.4 g/dL — ABNORMAL LOW (ref 12.0–15.0)
IMMATURE GRANULOCYTES: 0 %
LYMPHS ABS: 1 10*3/uL (ref 0.7–4.0)
Lymphocytes Relative: 16 %
MCH: 28.4 pg (ref 26.0–34.0)
MCHC: 28.7 g/dL — AB (ref 30.0–36.0)
MCV: 99.2 fL (ref 80.0–100.0)
MONO ABS: 0.6 10*3/uL (ref 0.1–1.0)
MONOS PCT: 9 %
NEUTROS PCT: 74 %
Neutro Abs: 4.6 10*3/uL (ref 1.7–7.7)
PLATELETS: 176 10*3/uL (ref 150–400)
RBC: 3.66 MIL/uL — ABNORMAL LOW (ref 3.87–5.11)
RDW: 15.2 % (ref 11.5–15.5)
WBC Count: 6.3 10*3/uL (ref 4.0–10.5)
nRBC: 0 % (ref 0.0–0.2)

## 2018-07-31 MED ORDER — DEXAMETHASONE 4 MG PO TABS: 4.0000 mg | ORAL_TABLET | Freq: Two times a day (BID) | ORAL | 0 refills | Status: AC

## 2018-07-31 MED ORDER — PROCHLORPERAZINE MALEATE 10 MG PO TABS: 10.0000 mg | ORAL_TABLET | Freq: Four times a day (QID) | ORAL | 0 refills | Status: AC | PRN

## 2018-07-31 MED ORDER — LORAZEPAM 1 MG PO TABS: 1.0000 mg | ORAL_TABLET | ORAL | 0 refills | Status: DC | PRN

## 2018-07-31 NOTE — Telephone Encounter (Signed)
Called patient to inform of MRI for 08-01-18 -arrival time - 3:30 pm @ [facility], pt.to bring meds for claustrophia and a driver, spoke with patient's daughter - Yetta Numbers and she is aware of this test

## 2018-07-31 NOTE — Progress Notes (Signed)
Radiation Oncology         [phone] ________________________________  Name: [name]        MRN: [id number]  Date of Service: 07/30/2018 DOB: 1941/08/31  CC:Sun, Gari Crown, MD  Donald Prose, MD     REFERRING PHYSICIAN: Dr. Lake Bells  DIAGNOSIS: The primary encounter diagnosis was Malignant neoplasm of mediastinum ([place]). 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   HISTORY OF PRESENT ILLNESS: [name] is a 77 y.o. female seen at the request of Dr. Lake Bells for a new diagnosis of small cell carcinoma of the lung.  The patient has a history of AVMs and upper GI bleed.  She sees Dr. Ardis Hughs for this and has had octreotide injections being administered. She was recently seen by providers as a result of shortness of breath and swelling of the chest for the last 3 to 4 months.  She underwent CT imaging on 06/24/2018 which revealed atherosclerotic changes of the thoracic aorta stable ectatic ascending aorta, and borderline dilated main pulmonary artery without emboli.  There was however a large infiltrative mass measuring 13 x 1 x 7.9 cm in the anterior mediastinum demonstrating encasement of the right coronary artery invasion into the anterior chest wall and sternum with extension of the superficial subcutaneous midline chest wall with moderate extrinsic narrowing and possible direct invasion into the left brachiocephalic vein and upper SVC.  This mass measured 1.5 x 0.9 cm in October 2017.  There was also new enlargement of left prevascular and right paratracheal node as well as pericardiophrenic nodes the largest of these was about 2.3 cm.  No enlarged hilar adenopathy was noted.  There are numerous solid pulmonary nodules throughout both lungs and a new 1.9 cm left adrenal nodule.  No aggressive appearing osseous  lesions were noted.  She was seen in pulmonary medicine and referred for CT biopsy which she underwent on 07/20/2018 which revealed small cell carcinoma.  She started noticing edema in her neck and face, and was referred for evaluation.  She is scheduled to see Dr. Julien Nordmann tomorrow.      PREVIOUS RADIATION THERAPY: No   PAST MEDICAL HISTORY:  Past Medical History:  Diagnosis Date  . Abdominal pain, unspecified site   . Anemia    Iron deficiency anemia  . Anxiety   . Aortic stenosis   . Arthritis   . CAD (coronary artery disease)    psot bypass x2 to diagonal and obtuse marginal. SVG graft-01/08/2009  . Depression   . Dyspnea   . GERD (gastroesophageal reflux disease)   . History of cardiovascular stress test    Myoview 7/16:  Low risk, no ischemia or scar, EF 72%  . History of echocardiogram    Echo 7/16:  EF 55-60%, no RWMA, Gr 2 DD, AVR ok (mean 20 mmHg), PASP 40 mmHg  . History of GI bleed    With AVM  . Hypercholesterolemia   . Hyperlipidemia   . Hypertension   . Iron deficiency anemia   . Myocardial infarction ([place]) 2010  . Neuromuscular disorder (HCC)    numbness in hands  . Peripheral vascular disease ([place])    righ tleg  . Shoulder impingement syndrome    Right shoulder       PAST SURGICAL HISTORY: Past Surgical History:  Procedure Laterality Date  .  ABDOMINAL HYSTERECTOMY    . AORTIC VALVE REPLACEMENT     [facility] #[id number]  . CARDIAC CATHETERIZATION N/A 08/11/2015   Procedure: Right Heart Cath;  Surgeon: Jolaine Artist, MD;  Location: [facility];  Service: Cardiovascular;  Laterality: N/A;  . CARDIAC SURGERY  2010   , 2 blockages  . CHOLECYSTECTOMY    . COLONOSCOPY N/A 04/12/2017   Procedure: COLONOSCOPY;  Surgeon: Manus Gunning, MD;  Location: [facility] END[facility]COPY;  Service: Gastroenterology;  Laterality: N/A;  . CORONARY ARTERY BYPASS GRAFT    . ENTEROSCOPY N/A 10/01/2017   Procedure: ENTEROSCOPY;  Surgeon: Irene Shipper, MD;  Location: [facility]  ENDOSCOPY;  Service: Endoscopy;  Laterality: N/A;  . ESOPHAGOGASTRODUODENOSCOPY N/A 04/12/2017   Procedure: ESOPHAGOGASTRODUODENOSCOPY (EGD);  Surgeon: Manus Gunning, MD;  Location: [facility];  Service: Gastroenterology;  Laterality: N/A;  . GIVENS CAPSULE STUDY N/A 02/23/2018   Procedure: GIVENS CAPSULE STUDY;  Surgeon: Milus Banister, MD;  Location: [facility] ENDOSCOPY;  Service: Endoscopy;  Laterality: N/A;  . HEEL SPUR EXCISION  2000   left heel  . SPINE SURGERY  2003   Herniated diskectomy  . TOTAL HIP ARTHROPLASTY Right 08/22/2017   Procedure: RIGHT TOTAL HIP ARTHROPLASTY ANTERIOR APPROACH;  Surgeon: Mcarthur Rossetti, MD;  Location: [facility];  Service: Orthopedics;  Laterality: Right;  . WRIST SURGERY     right wrist     FAMILY HISTORY:  Family History  Problem Relation Age of Onset  . Hypertension Mother   . Kidney disease Mother   . Hypertension Father   . Hypertension Sister   . Hypertension Brother   . Hypertension Daughter   . Diabetes Brother   . Heart attack Neg Hx   . Stroke Neg Hx      SOCIAL HISTORY:  reports that she has been smoking cigarettes. She has a 52.00 pack-year smoking history. She has never used smokeless tobacco. She reports that she does not drink alcohol or use drugs.   ALLERGIES: Codeine   MEDICATIONS:  Current Outpatient Medications  Medication Sig Dispense Refill  . acetaminophen (TYLENOL) 325 MG tablet Take 2 tablets (650 mg total) by mouth every 6 (six) hours as needed for mild pain (or Fever >/= 101).    Marland Kitchen albuterol (PROVENTIL) (2.5 MG/3ML) 0.083% nebulizer solution Take 3 mLs (2.5 mg total) by nebulization every 6 (six) hours as needed for wheezing or shortness of breath. 75 mL 11  . atorvastatin (LIPITOR) 80 MG tablet TAKE 1 TABLET BY MOUTH EVERY DAY 30 tablet 12  . BYSTOLIC 5 MG tablet TAKE 1 TABLET BY MOUTH EVERY DAY 90 tablet 3  . Calcium Carbonate-Vitamin D (CALCIUM 600+D) 600-400 MG-UNIT per tablet Take 1 tablet by  mouth daily.    . chlorpheniramine-HYDROcodone (TUSSIONEX PENNKINETIC ER) 10-8 MG/5ML SUER Take 5 mLs by mouth every 12 (twelve) hours as needed for cough. 140 mL 0  . ferrous sulfate 325 (65 FE) MG tablet Take 1 tablet (325 mg total) by mouth 2 (two) times daily with a meal. 60 tablet 0  . furosemide (LASIX) 20 MG tablet Take 20 mg daily by mouth.     Marland Kitchen glycopyrrolate (ROBINUL) 2 MG tablet Take 2 mg by mouth 2 (two) times daily.  1  . Multiple Vitamin (MULTIVITAMIN) tablet Take 1 tablet by mouth daily.    Marland Kitchen omeprazole (PRILOSEC) 40 MG capsule Take 1 capsule (40 mg total) by mouth 2 (two) times daily. 60 capsule 0  . potassium chloride (MICRO-K) 10 MEQ  CR capsule TAKE 2 CAPSULES BY MOUTH TWICE A DAY 120 capsule 9  . tiotropium (SPIRIVA HANDIHALER) 18 MCG inhalation capsule Place 1 capsule (18 mcg total) into inhaler and inhale every morning. 30 capsule 12  . traMADol (ULTRAM) 50 MG tablet 1 TABLET AS NEEDED TWICE A DAY FOR SEVERE PAIN ORALLY  0   Current Facility-Administered Medications  Medication Dose Route Frequency Provider Last Rate Last Dose  . octreotide (SANDOSTATIN LAR) IM injection 20 mg  20 mg Intramuscular Q30 days Milus Banister, MD      . octreotide (SANDOSTATIN LAR) IM injection 20 mg  20 mg Intramuscular Q30 days Milus Banister, MD         REVIEW OF SYSTEMS: On review of systems, the patient reports that she is doing fair at home.  is doing well overall. She lives with her daughter, but gets out of breath just going from her room to the bathroom, she is unable to cook or do much at home to contribute to the housework.  She relies on a walker since having her hip operated on.  She continues to have productive mucus and reports that it is clear to white in color.  She denies any hemoptysis.  She does describe orthopnea, and edema of the neck, chest wall, and extremities.  She has had headaches off and on for several weeks, as well as visual changes in her right eye.  She  describes blurriness but also at times feeling like she has cuts in her visual field she denies  fevers, chills, night sweats, unintended weight changes. She denies any bowel or bladder disturbances, and denies abdominal pain, nausea or vomiting. She denies any new musculoskeletal or joint aches or pains. A complete review of systems is obtained and is otherwise negative.     PHYSICAL EXAM:  Wt Readings from Last 3 Encounters:  07/30/18 135 lb 12.8 oz (61.6 kg)  07/27/18 132 lb (59.9 kg)  07/20/18 123 lb 14.4 oz (56.2 kg)   Temp Readings from Last 3 Encounters:  07/30/18 98.1 F (36.7 C) (Oral)  07/20/18 (!) 97.5 F (36.4 C) (Oral)  07/09/18 98.2 F (36.8 C) (Oral)   BP Readings from Last 3 Encounters:  07/30/18 (!) 155/90  07/27/18 124/72  07/20/18 (!) 142/71   Pulse Readings from Last 3 Encounters:  07/27/18 87  07/20/18 64  07/09/18 81   Pain Assessment Pain Score: 0-No pain/10  In general this is a chronically ill appearing African-American female in no acute distress.  She is alert and oriented x4 and appropriate throughout the examination. HEENT reveals that the patient is edematous in the neck particularly with mild JVD on the right, fullness in her face in comparison to pictures she shows me of what she typically looks like, otherwise she is normocephalic, atraumatic. EOMs are intact.  Visual field testing is difficult due to the fact that the patient is unable to comply with this testing, her eyes seem to be sensitive to light skin is intact without any evidence of gross lesions.  She has collateral vessels on her anterior chest wall, and fullness at the site of her sternum. She states this is been present for some time.  No tumor erosion through the skin is present. Cardiovascular exam reveals a regular rate and rhythm, no clicks rubs or murmurs are auscultated. Chest is clear to auscultation on the left but reveals inspiratory crackles in the right mid lung field as well as  what sounds to be  a rub. Lymphatic assessment is performed and does not reveal any adenopathy in the cervical, supraclavicular, axillary, or inguinal chains. Abdomen has active bowel sounds in all quadrants and is intact. The abdomen is soft, non tender, non distended. Lower extremities reveal trace edema bilaterally.  Her right upper extremity does have trace to 1+ edema from the hand extending up towards the shoulder.  The left upper extremity however is cool to touch, with 2+ edema of the dorsal aspect of her hand extending to the shoulder as well.   ECOG =3  0 - Asymptomatic (Fully active, able to carry on all predisease activities without restriction)  1 - Symptomatic but completely ambulatory (Restricted in physically strenuous activity but ambulatory and able to carry out work of a light or sedentary nature. For example, light housework, office work)  2 - Symptomatic, <50% in bed during the day (Ambulatory and capable of all self care but unable to carry out any work activities. Up and about more than 50% of waking hours)  3 - Symptomatic, >50% in bed, but not bedbound (Capable of only limited self-care, confined to bed or chair 50% or more of waking hours)  4 - Bedbound (Completely disabled. Cannot carry on any self-care. Totally confined to bed or chair)  5 - Death   Eustace Pen MM, Creech RH, Tormey DC, et al. [phone]). "Toxicity and response criteria of the [facility]". [place] Oncol. 5 (6): 649-55    LABORATORY DATA:  Lab Results  Component Value Date   WBC 5.0 07/20/2018   HGB 10.7 (L) 07/20/2018   HCT 36.4 07/20/2018   MCV 99.7 07/20/2018   PLT 153 07/20/2018   Lab Results  Component Value Date   NA 145 (H) 06/16/2018   K 4.7 06/16/2018   CL 107 (H) 06/16/2018   CO2 21 06/16/2018   Lab Results  Component Value Date   ALT 14 02/12/2018   AST 25 02/12/2018   ALKPHOS 75 02/12/2018   BILITOT 0.4 02/12/2018      RADIOGRAPHY: Ct  Biopsy  Result Date: 07/20/2018 INDICATION: 77 year old female with an infiltrative mediastinal mass encasing the sternum and extending into the superficial soft tissues of the anterior chest wall. She presents for CT-guided biopsy of the same to establish tissue diagnosis. EXAM: CT-guided biopsy, chest wall mass MEDICATIONS: None. ANESTHESIA/SEDATION: Moderate (conscious) sedation was employed during this procedure. A total of Versed 1.5 mg and Fentanyl 50 mcg was administered intravenously. Moderate Sedation Time: 10 minutes. The patient's level of consciousness and vital signs were monitored continuously by radiology nursing throughout the procedure under my direct supervision. FLUOROSCOPY TIME:  Fluoroscopy Time: 0 minutes 0 seconds (0 mGy). COMPLICATIONS: None immediate. PROCEDURE: Informed written consent was obtained from the patient after a thorough discussion of the procedural risks, benefits and alternatives. All questions were addressed. A timeout was performed prior to the initiation of the procedure. A planning axial CT scan was performed. The infiltrative soft tissue mass surrounding the sternum was successfully identified. A suitable skin entry site was selected and marked. The overlying skin was prepped and draped in the standard sterile fashion using chlorhexidine skin prep. Local anesthesia was attained by infiltration with 1% lidocaine. A small dermatotomy was made. Using intermittent CT guidance, a 17 gauge introducer needle was carefully advanced into the margin of the superficial component of the mass. Multiple 18 gauge core biopsies were then coaxially obtained using the bio Pince automated biopsy device. Biopsy specimens were placed in both  formalin and saline and delivered to pathology for further analysis. The saline is to insure that flow cytometry can be performed if necessary. Following biopsy, the introducer needle was removed. Follow-up CT imaging demonstrates no evidence of  immediate complication. IMPRESSION: Successful CT-guided biopsy of anterior chest wall mass. Electronically Signed   By: Jacqulynn Cadet M.D.   On: 07/20/2018 10:16       IMPRESSION/PLAN: 1. 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  Dr. Lisbeth Renshaw discusses the findings on her recent imaging.  He reviews the rationale for urgently intervening with radiotherapy, we discussed the rationale for palliation of her tumor, and she will meet with Dr. Julien Nordmann tomorrow.  She is still in need of completion of her work-up to include PET scan and brain MRI scan.  These have not been ordered but I have ordered these.  Regarding radiotherapy to the chest, we discussed the risks, benefits, short and long-term effects, the patient is interested in proceeding, Dr. Lisbeth Renshaw recommends a course of 3 weeks of treatment, if we find out that she has limited stage, we could extend this to chemo RT regimen, and we will follow-up with these results when available.Written consent is obtained and placed in the chart, a copy was provided to the patient. She will return tomorrow at 8 AM for simulation. 2. Headaches and visual changes.  I have discussed with the patient the risks of cerebral disease as a result of this type of cancer.  Recommend that we proceed with a stat MRI, given the time of day this will be performed tomorrow.  Patient is aware that if she had any acute onset of symptoms, and that she needs to be seen urgently in the emergency room tonight. 3. Left upper extremity edema concerning for DVT in the setting of prior upper GI bleed from AVMs.  The patient is followed regularly with Dr. Ardis Hughs, and I have notified him of her case.  We will proceed with stat Doppler in the morning, and I will have to discuss this further with Dr. Julien Nordmann if she does have a DVT.  She reports she has never had a clot in the past nor does she have any IVC filters.  She is also aware that if she had  acute onset of shortness of breath or progressive symptoms in her upper extremity, that she needs to be seen urgently in the emergency department.  The above documentation reflects my direct findings during this shared patient visit. Please see the separate note by Dr. Lisbeth Renshaw on this date for the remainder of the patient's plan of care.    Carola Rhine, PAC

## 2018-07-31 NOTE — Telephone Encounter (Signed)
IB Message sent to [place] regarding new start for 11/13, per 11/8 los.

## 2018-07-31 NOTE — Progress Notes (Signed)
Left upper extremity venous duplex has been completed. Negative for DVT. Results were given to [facility].  07/31/18 3:31 PM [name] RVT

## 2018-07-31 NOTE — Telephone Encounter (Signed)
Chemo Education scheduled/ I am working on treatment per 11/8 los

## 2018-07-31 NOTE — Progress Notes (Signed)
[name]   [phone]  CONSULT NOTE  REFERRING PHYSICIAN: Dr. Simonne Weeks  REASON FOR CONSULTATION:  77 years old African-American female recently diagnosed with lung cancer.  HPI [name] is a 77 y.o. female with long history of smoking and past medical history significant for anxiety/depression, hypertension, dyslipidemia, coronary artery disease status post myocardial infarction, aortic stenosis, peripheral vascular disease, and anemia.  The patient mentions that she has been complaining of worsening cough and shortness of breath for a while.  She also had swelling and subcutaneous mass of the chest wall.  She was seen by her primary care physician who ordered a ultrasound of the chest and it was performed on 05/12/2018 and that showed a lobulated well-circumscribed vascular mass measuring up to 5.7 cm just superficial to the ribs.  She was referred to her cardiologist and the patient had CT scan of the chest with contrast on June 24, 2018 and that showed a large infiltrative malignant appearing mass measuring 13.1 cm in the anterior mediastinal with anterior pericardial and chest wall invasion as well as encasement of the sternum.  There was also extrinsic narrowing and possible direct invasion of the left brachiocephalic vein and upper SVC by the mass.  There was encasement of the right coronary artery by the mass.  There was also new metastatic bilateral mediastinal adenopathy and numerous new pulmonary metastases in both lungs.  The scan also showed new left adrenal nodule suspicious for adrenal metastasis.  The patient was referred to Dr. Lake Weeks.  He arranged for CT-guided core biopsy of the anterior chest wall mass which was performed on July 20, 2018.  The final pathology ([id number]) was consistent with a small cell carcinoma.  The neoplastic cells were positive for cytokeratin AE 1/3, CD 56, synaptophysin but negative for  CD20, CD3, CD5, EMA, desmin, S100 and HMB-45.  The proliferative rate was high by Ki-67.  Overall, the morphology and immunophenotype were consistent with a small cell carcinoma. The patient was referred to me today for evaluation and recommendation regarding treatment of her condition. When seen today she had significant swelling of the left upper extremity as well as the face.  She also complaining of shortness of breath at baseline increased with exertion with central chest pain and cough with no hemoptysis.  She denied having any recent weight loss or night sweats.  She has no nausea, vomiting, diarrhea or constipation.  She denied having any headache or visual changes. The patient was seen by [name] from radiation oncology and expected to start palliative radiotherapy early next week. Family history significant for mother died from old age and father died from unknown cause. The patient is single and has 2 children.  She lives with her daughter [name].  The patient used to work at [employer].  She has a history of smoking around one pack per day for 50 years and unfortunately she continues to smoke.  She has no history of alcohol or drug abuse.  HPI  Past Medical History:  Diagnosis Date  . Abdominal pain, unspecified site   . Anemia    Iron deficiency anemia  . Anxiety   . Aortic stenosis   . Arthritis   . CAD (coronary artery disease)    psot bypass x2 to diagonal and obtuse marginal. SVG graft-01/08/2009  . Depression   . Dyspnea   . GERD (gastroesophageal reflux disease)   . History of cardiovascular stress test  Myoview 7/16:  Low risk, no ischemia or scar, EF 72%  . History of echocardiogram    Echo 7/16:  EF 55-60%, no RWMA, Gr 2 DD, AVR ok (mean 20 mmHg), PASP 40 mmHg  . History of GI bleed    With AVM  . Hypercholesterolemia   . Hyperlipidemia   . Hypertension   . Iron deficiency anemia   . Myocardial infarction ([place]) 2010  . Neuromuscular disorder (HCC)     numbness in hands  . Peripheral vascular disease ([place])    righ tleg  . Shoulder impingement syndrome    Right shoulder    Past Surgical History:  Procedure Laterality Date  . ABDOMINAL HYSTERECTOMY    . AORTIC VALVE REPLACEMENT     [place] #[id number]  . CARDIAC CATHETERIZATION N/A 08/11/2015   Procedure: Right Heart Cath;  Surgeon: Jolaine Artist, MD;  Location: [place] [facility];  Service: Cardiovascular;  Laterality: N/A;  . CARDIAC SURGERY  2010   , 2 blockages  . CHOLECYSTECTOMY    . COLONOSCOPY N/A 04/12/2017   Procedure: COLONOSCOPY;  Surgeon: Manus Gunning, MD;  Location: [facility] ENDOSCOPY;  Service: Gastroenterology;  Laterality: N/A;  . CORONARY ARTERY BYPASS GRAFT    . ENTEROSCOPY N/A 10/01/2017   Procedure: ENTEROSCOPY;  Surgeon: Irene Shipper, MD;  Location: [facility] ENDOSCOPY;  Service: Endoscopy;  Laterality: N/A;  . ESOPHAGOGASTRODUODENOSCOPY N/A 04/12/2017   Procedure: ESOPHAGOGASTRODUODENOSCOPY (EGD);  Surgeon: Manus Gunning, MD;  Location: [place];  Service: Gastroenterology;  Laterality: N/A;  . GIVENS CAPSULE STUDY N/A 02/23/2018   Procedure: GIVENS CAPSULE STUDY;  Surgeon: Milus Banister, MD;  Location: [facility] ENDOSCOPY;  Service: Endoscopy;  Laterality: N/A;  . HEEL SPUR EXCISION  2000   left heel  . SPINE SURGERY  2003   Herniated diskectomy  . TOTAL HIP ARTHROPLASTY Right 08/22/2017   Procedure: RIGHT TOTAL HIP ARTHROPLASTY ANTERIOR APPROACH;  Surgeon: Mcarthur Rossetti, MD;  Location: [facility] OR[facility];  Service: Orthopedics;  Laterality: Right;  . WRIST SURGERY     right wrist    Family History  Problem Relation Age of Onset  . Hypertension Mother   . Kidney disease Mother   . Hypertension Father   . Hypertension Sister   . Hypertension Brother   . Hypertension Daughter   . Diabetes Brother   . Heart attack Neg Hx   . Stroke Neg Hx     Social History Social History   Tobacco Use  . Smoking status: Current Every Day Smoker    Packs/day:  1.00    Years: 52.00    Pack years: 52.00    Types: Cigarettes  . Smokeless tobacco: Never Used  . Tobacco comment: smokes 1/2 pack per day  Substance Use Topics  . Alcohol use: No    Alcohol/week: 0.0 standard drinks  . Drug use: No    Allergies  Allergen Reactions  . Codeine Other (See Comments)    Feeling intoxicated    Current Outpatient Medications  Medication Sig Dispense Refill  . acetaminophen (TYLENOL) 325 MG tablet Take 2 tablets (650 mg total) by mouth every 6 (six) hours as needed for mild pain (or Fever >/= 101).    Marland Kitchen albuterol (PROVENTIL) (2.5 MG/3ML) 0.083% nebulizer solution Take 3 mLs (2.5 mg total) by nebulization every 6 (six) hours as needed for wheezing or shortness of breath. 75 mL 11  . amLODipine (NORVASC) 10 MG tablet Take 10 mg by mouth daily.    Marland Kitchen atorvastatin (  LIPITOR) 80 MG tablet TAKE 1 TABLET BY MOUTH EVERY DAY 30 tablet 12  . BYSTOLIC 5 MG tablet TAKE 1 TABLET BY MOUTH EVERY DAY 90 tablet 3  . Calcium Carbonate-Vitamin D (CALCIUM 600+D) 600-400 MG-UNIT per tablet Take 1 tablet by mouth daily.    . chlorpheniramine-HYDROcodone (TUSSIONEX PENNKINETIC ER) 10-8 MG/5ML SUER Take 5 mLs by mouth every 12 (twelve) hours as needed for cough. 140 mL 0  . ferrous sulfate 325 (65 FE) MG tablet Take 1 tablet (325 mg total) by mouth 2 (two) times daily with a meal. 60 tablet 0  . furosemide (LASIX) 20 MG tablet Take 20 mg daily by mouth.     Marland Kitchen glycopyrrolate (ROBINUL) 2 MG tablet Take 2 mg by mouth 2 (two) times daily.  1  . Multiple Vitamin (MULTIVITAMIN) tablet Take 1 tablet by mouth daily.    Marland Kitchen omeprazole (PRILOSEC) 40 MG capsule Take 1 capsule (40 mg total) by mouth 2 (two) times daily. 60 capsule 0  . potassium chloride (MICRO-K) 10 MEQ CR capsule TAKE 2 CAPSULES BY MOUTH TWICE A DAY 120 capsule 9  . tiotropium (SPIRIVA HANDIHALER) 18 MCG inhalation capsule Place 1 capsule (18 mcg total) into inhaler and inhale every morning. 30 capsule 12  . traMADol (ULTRAM)  50 MG tablet 1 TABLET AS NEEDED TWICE A DAY FOR SEVERE PAIN ORALLY  0   Current Facility-Administered Medications  Medication Dose Route Frequency Provider Last Rate Last Dose  . octreotide (SANDOSTATIN LAR) IM injection 20 mg  20 mg Intramuscular Q30 days Milus Banister, MD      . octreotide (SANDOSTATIN LAR) IM injection 20 mg  20 mg Intramuscular Q30 days Milus Banister, MD        Review of Systems  Constitutional: positive for fatigue Eyes: negative Ears, nose, mouth, throat, and face: positive for Facial swelling. Respiratory: positive for cough, dyspnea on exertion and pleurisy/chest pain Cardiovascular: negative Gastrointestinal: negative Genitourinary:negative Integument/breast: negative Hematologic/lymphatic: negative Musculoskeletal:negative Neurological: negative Behavioral/Psych: negative Endocrine: negative Allergic/Immunologic: negative  Physical Exam  MBT:DHRCB, healthy, well nourished, anxious and ill looking SKIN: skin color, texture, turgor are normal, no rashes or significant lesions HEAD: Significant facial swelling secondary to SVC. EYES: normal, PERRLA, EOMI EARS: External ears normal, Canals clear OROPHARYNX:no exudate, no erythema and lips, buccal mucosa, and tongue normal  NECK: supple, no adenopathy LYMPH:  no palpable lymphadenopathy, no hepatosplenomegaly BREAST:not examined LUNGS: expiratory wheezes bilaterally, scattered rhonchi bilaterally HEART: regular rate & rhythm and no murmurs ABDOMEN:abdomen soft, non-tender, normal bowel sounds and no masses or organomegaly BACK: No CVA tenderness, Range of motion is normal EXTREMITIES: 2+ edema and the left upper extremity. NEURO: alert & oriented x 3 with fluent speech, no focal motor/sensory deficits  PERFORMANCE STATUS: ECOG 1  LABORATORY DATA: Lab Results  Component Value Date   WBC 6.3 07/31/2018   HGB 10.4 (L) 07/31/2018   HCT 36.3 07/31/2018   MCV 99.2 07/31/2018   PLT 176  07/31/2018      Chemistry      Component Value Date/Time   NA 145 (H) 06/16/2018 1239   K 4.7 06/16/2018 1239   CL 107 (H) 06/16/2018 1239   CO2 21 06/16/2018 1239   BUN 7 (L) 06/16/2018 1239   CREATININE 0.60 06/16/2018 1239   CREATININE 0.64 08/07/2015 1152      Component Value Date/Time   CALCIUM 10.4 (H) 06/16/2018 1239   ALKPHOS 75 02/12/2018 1244   AST 25 02/12/2018 1244   ALT  14 02/12/2018 1244   BILITOT 0.4 02/12/2018 1244   BILITOT <0.2 04/08/2017 0841       RADIOGRAPHIC STUDIES: Ct Biopsy  Result Date: Aug 06, 2018 INDICATION: 77 year old female with an infiltrative mediastinal mass encasing the sternum and extending into the superficial soft tissues of the anterior chest wall. She presents for CT-guided biopsy of the same to establish tissue diagnosis. EXAM: CT-guided biopsy, chest wall mass MEDICATIONS: None. ANESTHESIA/SEDATION: Moderate (conscious) sedation was employed during this procedure. A total of Versed 1.5 mg and Fentanyl 50 mcg was administered intravenously. Moderate Sedation Time: 10 minutes. The patient's level of consciousness and vital signs were monitored continuously by radiology nursing throughout the procedure under my direct supervision. FLUOROSCOPY TIME:  Fluoroscopy Time: 0 minutes 0 seconds (0 mGy). COMPLICATIONS: None immediate. PROCEDURE: Informed written consent was obtained from the patient after a thorough discussion of the procedural risks, benefits and alternatives. All questions were addressed. A timeout was performed prior to the initiation of the procedure. A planning axial CT scan was performed. The infiltrative soft tissue mass surrounding the sternum was successfully identified. A suitable skin entry site was selected and marked. The overlying skin was prepped and draped in the standard sterile fashion using chlorhexidine skin prep. Local anesthesia was attained by infiltration with 1% lidocaine. A small dermatotomy was made. Using  intermittent CT guidance, a 17 gauge introducer needle was carefully advanced into the margin of the superficial component of the mass. Multiple 18 gauge core biopsies were then coaxially obtained using the bio Pince automated biopsy device. Biopsy specimens were placed in both formalin and saline and delivered to pathology for further analysis. The saline is to insure that flow cytometry can be performed if necessary. Following biopsy, the introducer needle was removed. Follow-up CT imaging demonstrates no evidence of immediate complication. IMPRESSION: Successful CT-guided biopsy of anterior chest wall mass. Electronically Signed   By: Jacqulynn Cadet M.D.   On: 08/06/18 10:16    ASSESSMENT: This is a very pleasant 77 years old African-American female with extensive stage (T4, N3, M1c) small cell lung cancer presented with large anterior mediastinal mass with invasion of the mediastinum, pericardium, compression of the SVC as well as bilateral mediastinal lymphadenopathy, bilateral pulmonary nodules as well as left adrenal metastasis diagnosed in October 2019.  The patient has clinical signs of SVC syndrome.  PLAN: I had a lengthy discussion with the patient and her daughter today about her current disease of stage, prognosis and treatment options. I personally and independently reviewed her scan images and discussed the results with the patient and her daughter.  I explained to the patient that she has incurable condition and all the treatment will be of palliative nature. The patient was given the option of palliative care and hospice referral versus consideration of palliative systemic chemotherapy as well as palliative radiation.   She is currently symptomatic with significant SVC syndrome and facial swelling as well as left upper extremity swelling.  I strongly recommend for the patient to proceed with palliative radiotherapy to the large anterior mass for relief of the SVC symptoms. I also  recommended for the patient systemic chemotherapy with carboplatin for AUC of 5 on day 1, etoposide 100 mg/M2 on days 1, 2 and 3 with Neulasta support in addition to Tecentriq (Atezolizumab) 1200 mg IV every 3 weeks. The patient and her daughter are interested in proceeding with treatment and she is expected to start the first cycle of this treatment early next week. I discussed with the  patient the adverse effect of this treatment including but not limited to alopecia, myelosuppression, nausea and vomiting, peripheral neuropathy, liver or renal dysfunction. The patient will have a chemotherapy education class before the first dose of her treatment. I will see her back for follow-up visit in 2 weeks for evaluation and management of any adverse effect of her treatment. I will call his pharmacy with prescription for Compazine 10 mg p.o. every 6 hours as needed for nausea in addition to Decadron 4 mg p.o. twice daily to help relieve some of her SVC symptoms for now. The patient was advised to call immediately or go to the hospital if she has any worsening symptoms in the interval. The patient voices understanding of current disease status and treatment options and is in agreement with the current care plan.  All questions were answered. The patient knows to call the clinic with any problems, questions or concerns. We can certainly see the patient much sooner if necessary.  Thank you so much for allowing me to participate in the care of [name]. I will continue to follow up the patient with you and assist in her care.  I spent 55 minutes counseling the patient face to face. The total time spent in the appointment was 80 minutes.  Disclaimer: This note was dictated with voice recognition software. Similar sounding words can inadvertently be transcribed and may not be corrected upon review.   Eilleen Kempf July 31, 2018, 9:18 AM

## 2018-07-31 NOTE — Progress Notes (Signed)
START ON PATHWAY REGIMEN - Small Cell Lung     Cycles 1 through 4, every 21 days:     Atezolizumab      Carboplatin      Etoposide    Cycles 5 and beyond, every 21 days:     Atezolizumab   **Always confirm dose/schedule in your pharmacy ordering system**  Patient Characteristics: Newly Diagnosed, Preoperative or Nonsurgical Candidate (Clinical Staging), First Line, Extensive Stage Therapeutic Status: Newly Diagnosed, Preoperative or Nonsurgical Candidate (Clinical Staging) AJCC T Category: cT4 AJCC N Category: cN2 AJCC M Category: cM1c AJCC 8 Stage Grouping: IVB Stage Classification: Extensive Intent of Therapy: Non-Curative / Palliative Intent, Discussed with Patient

## 2018-07-31 NOTE — Patient Instructions (Signed)
Steps to Quit Smoking Smoking tobacco can be bad for your health. It can also affect almost every organ in your body. Smoking puts you and people around you at risk for many serious long-lasting (chronic) diseases. Quitting smoking is hard, but it is one of the best things that you can do for your health. It is never too late to quit. What are the benefits of quitting smoking? When you quit smoking, you lower your risk for getting serious diseases and conditions. They can include:  Lung cancer or lung disease.  Heart disease.  Stroke.  Heart attack.  Not being able to have children (infertility).  Weak bones (osteoporosis) and broken bones (fractures).  If you have coughing, wheezing, and shortness of breath, those symptoms may get better when you quit. You may also get sick less often. If you are pregnant, quitting smoking can help to lower your chances of having a baby of low birth weight. What can I do to help me quit smoking? Talk with your doctor about what can help you quit smoking. Some things you can do (strategies) include:  Quitting smoking totally, instead of slowly cutting back how much you smoke over a period of time.  Going to in-person counseling. You are more likely to quit if you go to many counseling sessions.  Using resources and support systems, such as: ? Online chats with a counselor. ? Phone quitlines. ? Printed self-help materials. ? Support groups or group counseling. ? Text messaging programs. ? Mobile phone apps or applications.  Taking medicines. Some of these medicines may have nicotine in them. If you are pregnant or breastfeeding, do not take any medicines to quit smoking unless your doctor says it is okay. Talk with your doctor about counseling or other things that can help you.  Talk with your doctor about using more than one strategy at the same time, such as taking medicines while you are also going to in-person counseling. This can help make  quitting easier. What things can I do to make it easier to quit? Quitting smoking might feel very hard at first, but there is a lot that you can do to make it easier. Take these steps:  Talk to your family and friends. Ask them to support and encourage you.  Call phone quitlines, reach out to support groups, or work with a counselor.  Ask people who smoke to not smoke around you.  Avoid places that make you want (trigger) to smoke, such as: ? Bars. ? Parties. ? Smoke-break areas at work.  Spend time with people who do not smoke.  Lower the stress in your life. Stress can make you want to smoke. Try these things to help your stress: ? Getting regular exercise. ? Deep-breathing exercises. ? Yoga. ? Meditating. ? Doing a body scan. To do this, close your eyes, focus on one area of your body at a time from head to toe, and notice which parts of your body are tense. Try to relax the muscles in those areas.  Download or buy apps on your mobile phone or tablet that can help you stick to your quit plan. There are many free apps, such as QuitGuide from the CDC (Centers for Disease Control and Prevention). You can find more support from smokefree.gov and other websites.  This information is not intended to replace advice given to you by your health care provider. Make sure you discuss any questions you have with your health care provider. Document Released: 07/06/2009 Document   Revised: 05/07/2016 Document Reviewed: 01/24/2015 Elsevier Interactive Patient Education  2018 Elsevier Inc.  

## 2018-07-31 NOTE — Progress Notes (Signed)
[place] Clinical Social Work  Clinical Social Work was referred by Shona Simpson, PA for assessment of psychosocial needs, specifically transportation.  Clinical Social Worker met with patient in scheduling area after medical oncologist appointment  to offer support and assess for needs.  CSW initially met with patient, stated her only concern right now was "getting well".  Her daughter was also present at time of visit and stated she does not have any transportation concerns. CSW reviewed support services available and encouraged patient/daughter to follow up as needed.      Gwinda Maine, LCSW  Clinical Social Worker [facility]

## 2018-07-31 NOTE — Telephone Encounter (Signed)
CALLED PATIENT TO INFORM OF [place] VAS FOR 07-31-18 - ARRIVAL TIME - 2:30 PM @ [facility] ADMITT[facility], TEST TO BE @ 3 PM @ [facility] RADIOLOG[facility] , SPOKE WITH PATIENT'S DAUGHTER , MS. FERRELL AND SHE IS AWARE OF THIS TEST

## 2018-08-01 ENCOUNTER — Ambulatory Visit (HOSPITAL_COMMUNITY): Payer: Medicare Other

## 2018-08-01 ENCOUNTER — Ambulatory Visit (HOSPITAL_COMMUNITY)
Admission: RE | Admit: 2018-08-01 | Discharge: 2018-08-01 | Disposition: A | Discharge status: Home / Self Care | Payer: Medicare Other | Source: Ambulatory Visit | Attending: Radiation Oncology | Admitting: Radiation Oncology

## 2018-08-01 DIAGNOSIS — C3490 Malignant neoplasm of unspecified part of unspecified bronchus or lung: Secondary | ICD-10-CM

## 2018-08-01 MED ORDER — GADOBUTROL 1 MMOL/ML IV SOLN
7.5000 mL | Freq: Once | INTRAVENOUS | Status: AC | PRN
  Administered 2018-08-01: 6 mL via INTRAVENOUS

## 2018-08-03 ENCOUNTER — Ambulatory Visit
Admission: RE | Admit: 2018-08-03 | Discharge: 2018-08-03 | Disposition: A | Discharge status: Home / Self Care | Payer: Medicare Other | Source: Ambulatory Visit | Attending: Radiation Oncology | Admitting: Radiation Oncology

## 2018-08-03 ENCOUNTER — Telehealth: Payer: Self-pay | Admitting: Internal Medicine

## 2018-08-03 ENCOUNTER — Inpatient Hospital Stay: Payer: Medicare Other

## 2018-08-03 DIAGNOSIS — C383 Malignant neoplasm of mediastinum, part unspecified: Secondary | ICD-10-CM | POA: Diagnosis not present

## 2018-08-03 NOTE — Telephone Encounter (Signed)
Scheduled appt per 11/8 sch message - pt to get an updated schedule in chemo edu '

## 2018-08-04 ENCOUNTER — Ambulatory Visit
Admission: RE | Admit: 2018-08-04 | Discharge: 2018-08-04 | Disposition: A | Discharge status: Home / Self Care | Payer: Medicare Other | Source: Ambulatory Visit | Attending: Radiation Oncology | Admitting: Radiation Oncology

## 2018-08-04 ENCOUNTER — Inpatient Hospital Stay: Payer: Medicare Other

## 2018-08-04 ENCOUNTER — Ambulatory Visit: Payer: Medicare Other

## 2018-08-04 ENCOUNTER — Ambulatory Visit: Payer: Medicare Other | Admitting: Radiation Oncology

## 2018-08-04 ENCOUNTER — Telehealth: Payer: Self-pay | Admitting: Radiation Oncology

## 2018-08-04 DIAGNOSIS — C3490 Malignant neoplasm of unspecified part of unspecified bronchus or lung: Secondary | ICD-10-CM

## 2018-08-04 DIAGNOSIS — C3492 Malignant neoplasm of unspecified part of left bronchus or lung: Secondary | ICD-10-CM | POA: Diagnosis not present

## 2018-08-04 DIAGNOSIS — C383 Malignant neoplasm of mediastinum, part unspecified: Secondary | ICD-10-CM | POA: Diagnosis not present

## 2018-08-04 DIAGNOSIS — C7931 Secondary malignant neoplasm of brain: Secondary | ICD-10-CM

## 2018-08-04 LAB — CMP (CANCER CENTER ONLY)
ALT: 15 U/L (ref 0–44)
AST: 26 U/L (ref 15–41)
Albumin: 3.7 g/dL (ref 3.5–5.0)
Alkaline Phosphatase: 76 U/L (ref 38–126)
Anion gap: 10 (ref 5–15)
BUN: 8 mg/dL (ref 8–23)
CO2: 26 mmol/L (ref 22–32)
CREATININE: 0.66 mg/dL (ref 0.44–1.00)
Calcium: 9.6 mg/dL (ref 8.9–10.3)
Chloride: 107 mmol/L (ref 98–111)
Glucose, Bld: 142 mg/dL — ABNORMAL HIGH (ref 70–99)
Potassium: 4 mmol/L (ref 3.5–5.1)
Sodium: 143 mmol/L (ref 135–145)
TOTAL PROTEIN: 7.7 g/dL (ref 6.5–8.1)
Total Bilirubin: 0.5 mg/dL (ref 0.3–1.2)

## 2018-08-04 LAB — CBC WITH DIFFERENTIAL (CANCER CENTER ONLY)
ABS IMMATURE GRANULOCYTES: 0.05 10*3/uL (ref 0.00–0.07)
BASOS PCT: 0 %
Basophils Absolute: 0 10*3/uL (ref 0.0–0.1)
EOS ABS: 0 10*3/uL (ref 0.0–0.5)
Eosinophils Relative: 0 %
HEMATOCRIT: 36.2 % (ref 36.0–46.0)
Hemoglobin: 10.7 g/dL — ABNORMAL LOW (ref 12.0–15.0)
IMMATURE GRANULOCYTES: 1 %
LYMPHS ABS: 0.5 10*3/uL — AB (ref 0.7–4.0)
Lymphocytes Relative: 5 %
MCH: 29.1 pg (ref 26.0–34.0)
MCHC: 29.6 g/dL — ABNORMAL LOW (ref 30.0–36.0)
MCV: 98.4 fL (ref 80.0–100.0)
MONO ABS: 0.7 10*3/uL (ref 0.1–1.0)
MONOS PCT: 8 %
NEUTROS PCT: 86 %
Neutro Abs: 7.9 10*3/uL — ABNORMAL HIGH (ref 1.7–7.7)
PLATELETS: 210 10*3/uL (ref 150–400)
RBC: 3.68 MIL/uL — ABNORMAL LOW (ref 3.87–5.11)
RDW: 15.2 % (ref 11.5–15.5)
WBC Count: 9.1 10*3/uL (ref 4.0–10.5)
nRBC: 0.8 % — ABNORMAL HIGH (ref 0.0–0.2)

## 2018-08-04 LAB — TSH: TSH: 0.464 u[IU]/mL (ref 0.308–3.960)

## 2018-08-04 NOTE — Telephone Encounter (Signed)
Delayed entry over long weekend. I called pt's daughter and reviewed MRI results. Plan will be for whole brain radiotherapy. She was encouraged to increase dexamethasone to 4 mg TID. Precautions and side effect profile were outlined. She was consented for WB radiotherapy this am.

## 2018-08-05 ENCOUNTER — Ambulatory Visit
Admission: RE | Admit: 2018-08-05 | Discharge: 2018-08-05 | Disposition: A | Discharge status: Home / Self Care | Payer: Medicare Other | Source: Ambulatory Visit | Attending: Radiation Oncology | Admitting: Radiation Oncology

## 2018-08-05 ENCOUNTER — Other Ambulatory Visit: Payer: Self-pay | Admitting: Medical Oncology

## 2018-08-05 ENCOUNTER — Other Ambulatory Visit: Payer: Self-pay | Admitting: Radiology

## 2018-08-05 ENCOUNTER — Inpatient Hospital Stay: Payer: Medicare Other

## 2018-08-05 VITALS — BP 127/69 | HR 94 | Temp 97.8°F | Resp 26

## 2018-08-05 DIAGNOSIS — C383 Malignant neoplasm of mediastinum, part unspecified: Secondary | ICD-10-CM | POA: Diagnosis not present

## 2018-08-05 DIAGNOSIS — C3492 Malignant neoplasm of unspecified part of left bronchus or lung: Secondary | ICD-10-CM | POA: Diagnosis not present

## 2018-08-05 DIAGNOSIS — I871 Compression of vein: Secondary | ICD-10-CM

## 2018-08-05 DIAGNOSIS — C3490 Malignant neoplasm of unspecified part of unspecified bronchus or lung: Secondary | ICD-10-CM

## 2018-08-05 MED ORDER — SODIUM CHLORIDE 0.9 % IV SOLN
Freq: Once | INTRAVENOUS | Status: AC
  Administered 2018-08-05: 10:00:00 via INTRAVENOUS
  Filled 2018-08-05: qty 5

## 2018-08-05 MED ORDER — SODIUM CHLORIDE 0.9 % IV SOLN
100.0000 mg/m2 | Freq: Once | INTRAVENOUS | Status: AC
  Administered 2018-08-05: 160 mg via INTRAVENOUS
  Filled 2018-08-05: qty 8

## 2018-08-05 MED ORDER — SODIUM CHLORIDE 0.9 % IV SOLN
Freq: Once | INTRAVENOUS | Status: AC
  Administered 2018-08-05: 10:00:00 via INTRAVENOUS
  Filled 2018-08-05: qty 250

## 2018-08-05 MED ORDER — PALONOSETRON HCL INJECTION 0.25 MG/5ML
INTRAVENOUS | Status: AC
  Filled 2018-08-05: qty 5

## 2018-08-05 MED ORDER — PALONOSETRON HCL INJECTION 0.25 MG/5ML
0.2500 mg | Freq: Once | INTRAVENOUS | Status: AC
  Administered 2018-08-05: 0.25 mg via INTRAVENOUS

## 2018-08-05 MED ORDER — SODIUM CHLORIDE 0.9 % IV SOLN
352.5000 mg | Freq: Once | INTRAVENOUS | Status: AC
  Administered 2018-08-05: 350 mg via INTRAVENOUS
  Filled 2018-08-05: qty 35

## 2018-08-05 NOTE — Progress Notes (Signed)
Multiple IV unsuccessful IV attempts by multiple nurses. Spoke with Abelina Bachelor, RN who will work on getting appt for PICC or PAC. Spoke with Vaughan Basta with IV Team at [facility] regarding PICC placement. With patient's h/o questionable SVC syndrome, IV team would be unable to place PICC. Advised to have Dr. Julien Nordmann contact interventional radiology to discuss urgency of need for access. Abelina Bachelor, RN will discuss with Dr. Julien Nordmann. PAC placement scheduled for 08/07/2018.

## 2018-08-05 NOTE — Progress Notes (Signed)
Late entry for 1015: Spoke with Tim, RN: PIV no longer needed. Order canceled.

## 2018-08-05 NOTE — Progress Notes (Signed)
Confirmed plan w/ Dr. Julien Nordmann - plan is to delay atezolizumab until C2 of treatment in setting of current steroid use. Keep etoposide dose at 100 mg/m2 (CrCL ~45 mL/min).   Demetrius Charity, PharmD, [place] [facility] Pharmacist Pharmacy Phone: [phone] 08/05/2018

## 2018-08-05 NOTE — Patient Instructions (Addendum)
Port a cath procedure-appt and instructions.scheduled for Friday November 15th arrive at 1100. Nothing to eat or drink for  6 hours before procedure -may take morning meds except diabetic meds. Needs a driver.  [facility] Discharge Instructions for Patients Receiving Chemotherapy  Today you received the following chemotherapy agents:  Etoposide and Carboplatin.  To help prevent nausea and vomiting after your treatment, we encourage you to take your nausea medication as directed.   If you develop nausea and vomiting that is not controlled by your nausea medication, call the clinic.   BELOW ARE SYMPTOMS THAT SHOULD BE REPORTED IMMEDIATELY:  *FEVER GREATER THAN 100.5 F  *CHILLS WITH OR WITHOUT FEVER  NAUSEA AND VOMITING THAT IS NOT CONTROLLED WITH YOUR NAUSEA MEDICATION  *UNUSUAL SHORTNESS OF BREATH  *UNUSUAL BRUISING OR BLEEDING  TENDERNESS IN MOUTH AND THROAT WITH OR WITHOUT PRESENCE OF ULCERS  *URINARY PROBLEMS  *BOWEL PROBLEMS  UNUSUAL RASH Items with * indicate a potential emergency and should be followed up as soon as possible.  Feel free to call the clinic should you have any questions or concerns. The clinic phone number is [phone].  Please show the [place] at check-in to the Emergency Department and triage nurse.  Etoposide, VP-16 injection What is this medicine? ETOPOSIDE, VP-16 (e toe POE side) is a chemotherapy drug. It is used to treat testicular cancer, lung cancer, and other cancers. This medicine may be used for other purposes; ask your health care provider or pharmacist if you have questions. COMMON BRAND NAME(S): Etopophos, Toposar, VePesid What should I tell my health care provider before I take this medicine? They need to know if you have any of these conditions: -infection -kidney disease -liver disease -low blood counts, like low white cell, platelet, or red cell counts -an unusual or allergic reaction to etoposide,  other medicines, foods, dyes, or preservatives -pregnant or trying to get pregnant -breast-feeding How should I use this medicine? This medicine is for infusion into a vein. It is administered in a hospital or clinic by a specially trained health care professional. Talk to your pediatrician regarding the use of this medicine in children. Special care may be needed. Overdosage: If you think you have taken too much of this medicine contact a poison control center or emergency room at once. NOTE: This medicine is only for you. Do not share this medicine with others. What if I miss a dose? It is important not to miss your dose. Call your doctor or health care professional if you are unable to keep an appointment. What may interact with this medicine? -aspirin -certain medications for seizures like carbamazepine, phenobarbital, phenytoin, valproic acid -cyclosporine -levamisole -warfarin This list may not describe all possible interactions. Give your health care provider a list of all the medicines, herbs, non-prescription drugs, or dietary supplements you use. Also tell them if you smoke, drink alcohol, or use illegal drugs. Some items may interact with your medicine. What should I watch for while using this medicine? Visit your doctor for checks on your progress. This drug may make you feel generally unwell. This is not uncommon, as chemotherapy can affect healthy cells as well as cancer cells. Report any side effects. Continue your course of treatment even though you feel ill unless your doctor tells you to stop. In some cases, you may be given additional medicines to help with side effects. Follow all directions for their use. Call your doctor or health care professional for advice if you  get a fever, chills or sore throat, or other symptoms of a cold or flu. Do not treat yourself. This drug decreases your body's ability to fight infections. Try to avoid being around people who are sick. This  medicine may increase your risk to bruise or bleed. Call your doctor or health care professional if you notice any unusual bleeding. Talk to your doctor about your risk of cancer. You may be more at risk for certain types of cancers if you take this medicine. Do not become pregnant while taking this medicine or for at least 6 months after stopping it. Women should inform their doctor if they wish to become pregnant or think they might be pregnant. Women of child-bearing potential will need to have a negative pregnancy test before starting this medicine. There is a potential for serious side effects to an unborn child. Talk to your health care professional or pharmacist for more information. Do not breast-feed an infant while taking this medicine. Men must use a latex condom during sexual contact with a woman while taking this medicine and for at least 4 months after stopping it. A latex condom is needed even if you have had a vasectomy. Contact your doctor right away if your partner becomes pregnant. Do not donate sperm while taking this medicine and for at least 4 months after you stop taking this medicine. Men should inform their doctors if they wish to father a child. This medicine may lower sperm counts. What side effects may I notice from receiving this medicine? Side effects that you should report to your doctor or health care professional as soon as possible: -allergic reactions like skin rash, itching or hives, swelling of the face, lips, or tongue -low blood counts - this medicine may decrease the number of white blood cells, red blood cells and platelets. You may be at increased risk for infections and bleeding. -signs of infection - fever or chills, cough, sore throat, pain or difficulty passing urine -signs of decreased platelets or bleeding - bruising, pinpoint red spots on the skin, black, tarry stools, blood in the urine -signs of decreased red blood cells - unusually weak or tired, fainting  spells, lightheadedness -breathing problems -changes in vision -mouth or throat sores or ulcers -pain, redness, swelling or irritation at the injection site -pain, tingling, numbness in the hands or feet -redness, blistering, peeling or loosening of the skin, including inside the mouth -seizures -vomiting Side effects that usually do not require medical attention (report to your doctor or health care professional if they continue or are bothersome): -diarrhea -hair loss -loss of appetite -nausea -stomach pain This list may not describe all possible side effects. Call your doctor for medical advice about side effects. You may report side effects to FDA at 1-800-FDA-1088. Where should I keep my medicine? This drug is given in a hospital or clinic and will not be stored at home. NOTE: This sheet is a summary. It may not cover all possible information. If you have questions about this medicine, talk to your doctor, pharmacist, or health care provider.  2018 Elsevier/Gold Standard (2015-09-01 11:53:23)  Carboplatin injection What is this medicine? CARBOPLATIN (KAR boe pla tin) is a chemotherapy drug. It targets fast dividing cells, like cancer cells, and causes these cells to die. This medicine is used to treat ovarian cancer and many other cancers. This medicine may be used for other purposes; ask your health care provider or pharmacist if you have questions. COMMON BRAND NAME(S): Paraplatin What should  I tell my health care provider before I take this medicine? They need to know if you have any of these conditions: -blood disorders -hearing problems -kidney disease -recent or ongoing radiation therapy -an unusual or allergic reaction to carboplatin, cisplatin, other chemotherapy, other medicines, foods, dyes, or preservatives -pregnant or trying to get pregnant -breast-feeding How should I use this medicine? This drug is usually given as an infusion into a vein. It is administered  in a hospital or clinic by a specially trained health care professional. Talk to your pediatrician regarding the use of this medicine in children. Special care may be needed. Overdosage: If you think you have taken too much of this medicine contact a poison control center or emergency room at once. NOTE: This medicine is only for you. Do not share this medicine with others. What if I miss a dose? It is important not to miss a dose. Call your doctor or health care professional if you are unable to keep an appointment. What may interact with this medicine? -medicines for seizures -medicines to increase blood counts like filgrastim, pegfilgrastim, sargramostim -some antibiotics like amikacin, gentamicin, neomycin, streptomycin, tobramycin -vaccines Talk to your doctor or health care professional before taking any of these medicines: -acetaminophen -aspirin -ibuprofen -ketoprofen -naproxen This list may not describe all possible interactions. Give your health care provider a list of all the medicines, herbs, non-prescription drugs, or dietary supplements you use. Also tell them if you smoke, drink alcohol, or use illegal drugs. Some items may interact with your medicine. What should I watch for while using this medicine? Your condition will be monitored carefully while you are receiving this medicine. You will need important blood work done while you are taking this medicine. This drug may make you feel generally unwell. This is not uncommon, as chemotherapy can affect healthy cells as well as cancer cells. Report any side effects. Continue your course of treatment even though you feel ill unless your doctor tells you to stop. In some cases, you may be given additional medicines to help with side effects. Follow all directions for their use. Call your doctor or health care professional for advice if you get a fever, chills or sore throat, or other symptoms of a cold or flu. Do not treat yourself.  This drug decreases your body's ability to fight infections. Try to avoid being around people who are sick. This medicine may increase your risk to bruise or bleed. Call your doctor or health care professional if you notice any unusual bleeding. Be careful brushing and flossing your teeth or using a toothpick because you may get an infection or bleed more easily. If you have any dental work done, tell your dentist you are receiving this medicine. Avoid taking products that contain aspirin, acetaminophen, ibuprofen, naproxen, or ketoprofen unless instructed by your doctor. These medicines may hide a fever. Do not become pregnant while taking this medicine. Women should inform their doctor if they wish to become pregnant or think they might be pregnant. There is a potential for serious side effects to an unborn child. Talk to your health care professional or pharmacist for more information. Do not breast-feed an infant while taking this medicine. What side effects may I notice from receiving this medicine? Side effects that you should report to your doctor or health care professional as soon as possible: -allergic reactions like skin rash, itching or hives, swelling of the face, lips, or tongue -signs of infection - fever or chills, cough, sore throat,  pain or difficulty passing urine -signs of decreased platelets or bleeding - bruising, pinpoint red spots on the skin, black, tarry stools, nosebleeds -signs of decreased red blood cells - unusually weak or tired, fainting spells, lightheadedness -breathing problems -changes in hearing -changes in vision -chest pain -high blood pressure -low blood counts - This drug may decrease the number of white blood cells, red blood cells and platelets. You may be at increased risk for infections and bleeding. -nausea and vomiting -pain, swelling, redness or irritation at the injection site -pain, tingling, numbness in the hands or feet -problems with balance,  talking, walking -trouble passing urine or change in the amount of urine Side effects that usually do not require medical attention (report to your doctor or health care professional if they continue or are bothersome): -hair loss -loss of appetite -metallic taste in the mouth or changes in taste This list may not describe all possible side effects. Call your doctor for medical advice about side effects. You may report side effects to FDA at 1-800-FDA-1088. Where should I keep my medicine? This drug is given in a hospital or clinic and will not be stored at home. NOTE: This sheet is a summary. It may not cover all possible information. If you have questions about this medicine, talk to your doctor, pharmacist, or health care provider.  2018 Elsevier/Gold Standard (2007-12-15 14:38:05)

## 2018-08-06 ENCOUNTER — Encounter: Payer: Self-pay | Admitting: General Practice

## 2018-08-06 ENCOUNTER — Encounter (HOSPITAL_COMMUNITY): Payer: Self-pay

## 2018-08-06 ENCOUNTER — Ambulatory Visit
Admission: RE | Admit: 2018-08-06 | Discharge: 2018-08-06 | Disposition: A | Discharge status: Home / Self Care | Payer: Medicare Other | Source: Ambulatory Visit | Attending: Radiation Oncology | Admitting: Radiation Oncology

## 2018-08-06 ENCOUNTER — Encounter (HOSPITAL_COMMUNITY)
Admission: RE | Admit: 2018-08-06 | Discharge: 2018-08-06 | Disposition: A | Discharge status: Home / Self Care | Payer: Medicare Other | Source: Ambulatory Visit | Attending: Internal Medicine | Admitting: Internal Medicine

## 2018-08-06 ENCOUNTER — Inpatient Hospital Stay (HOSPITAL_BASED_OUTPATIENT_CLINIC_OR_DEPARTMENT_OTHER): Payer: Medicare Other | Admitting: Medical

## 2018-08-06 ENCOUNTER — Other Ambulatory Visit: Payer: Self-pay | Admitting: Medical

## 2018-08-06 ENCOUNTER — Telehealth: Payer: Self-pay | Admitting: Medical Oncology

## 2018-08-06 ENCOUNTER — Inpatient Hospital Stay: Payer: Medicare Other

## 2018-08-06 VITALS — BP 138/83 | HR 79 | Temp 98.9°F | Resp 18

## 2018-08-06 DIAGNOSIS — D500 Iron deficiency anemia secondary to blood loss (chronic): Secondary | ICD-10-CM | POA: Diagnosis present

## 2018-08-06 DIAGNOSIS — R05 Cough: Secondary | ICD-10-CM

## 2018-08-06 DIAGNOSIS — K5521 Angiodysplasia of colon with hemorrhage: Secondary | ICD-10-CM | POA: Insufficient documentation

## 2018-08-06 DIAGNOSIS — C7972 Secondary malignant neoplasm of left adrenal gland: Secondary | ICD-10-CM

## 2018-08-06 DIAGNOSIS — K922 Gastrointestinal hemorrhage, unspecified: Secondary | ICD-10-CM | POA: Insufficient documentation

## 2018-08-06 DIAGNOSIS — C383 Malignant neoplasm of mediastinum, part unspecified: Secondary | ICD-10-CM | POA: Diagnosis not present

## 2018-08-06 DIAGNOSIS — C3492 Malignant neoplasm of unspecified part of left bronchus or lung: Secondary | ICD-10-CM

## 2018-08-06 DIAGNOSIS — R0602 Shortness of breath: Secondary | ICD-10-CM

## 2018-08-06 DIAGNOSIS — C3491 Malignant neoplasm of unspecified part of right bronchus or lung: Secondary | ICD-10-CM

## 2018-08-06 MED ORDER — ALBUTEROL SULFATE (2.5 MG/3ML) 0.083% IN NEBU
INHALATION_SOLUTION | RESPIRATORY_TRACT | Status: AC
  Filled 2018-08-06: qty 3

## 2018-08-06 MED ORDER — ALBUTEROL SULFATE (2.5 MG/3ML) 0.083% IN NEBU
2.5000 mg | INHALATION_SOLUTION | Freq: Once | RESPIRATORY_TRACT | Status: DC
  Filled 2018-08-06: qty 3

## 2018-08-06 MED ORDER — OCTREOTIDE ACETATE 20 MG IM KIT
20.0000 mg | PACK | Freq: Once | INTRAMUSCULAR | Status: AC
  Administered 2018-08-06: 20 mg via INTRAMUSCULAR
  Filled 2018-08-06: qty 1

## 2018-08-06 NOTE — Progress Notes (Signed)
[facility] CSW Progress Notes  [facility] Psychosocial Distress Screening Clinical Social Work  Clinical Social Work was referred by distress screening protocol.  The patient scored a 6 on the Psychosocial Distress Thermometer which indicates moderate distress. Clinical Social Worker contacted patient by phone to assess for distress and other psychosocial needs. Unable to reach patient, left VM w description of [place] services and contact information.  Pt has already been seen and assessed by CSW [place].    [facility] DISTRESS SCREENING 07/30/2018  Screening Type Initial Screening  Distress experienced in past week (1-10) 6  Practical problem type Housing  Emotional problem type Adjusting to appearance changes  Spiritual/Religous concerns type Loss of sense of purpose  Information Concerns Type Lack of info about diagnosis  Physical Problem type Getting around;Breathing;Tingling hands/feet;Swollen arms/legs  Other [phone]    Clinical Social Worker follow up needed: No.  If yes, follow up plan:  Beverely Pace, [place], LCSW Clinical Social Worker Phone:  [phone]

## 2018-08-06 NOTE — Telephone Encounter (Signed)
[facility] called expecting pt . I told [name] pt is supposed to be at [facility].

## 2018-08-06 NOTE — Progress Notes (Signed)
Patient's treatment was cancelled today due to poor venous access. Dr. Julien Nordmann notified. Patient to continue treatment after getting her port on 08/07/18. Patient and daughter informed and they verbalized understanding.

## 2018-08-06 NOTE — Telephone Encounter (Signed)
Treatment delay- Pt did not get day 2  vp-16 due to no IV access. Pt will get port tomorrow at 1 and if successful does Mohamed want pt to receive  VP-16 tomorrow after her port?    Udencya scheduled for Monday , is that okay ? IN basket message to pharmacy and [place].  Marland Kitchen

## 2018-08-07 ENCOUNTER — Ambulatory Visit
Admission: RE | Admit: 2018-08-07 | Discharge: 2018-08-07 | Disposition: A | Discharge status: Home / Self Care | Payer: Medicare Other | Source: Ambulatory Visit | Attending: Radiation Oncology | Admitting: Radiation Oncology

## 2018-08-07 ENCOUNTER — Ambulatory Visit (HOSPITAL_COMMUNITY): Admission: RE | Admit: 2018-08-07 | Payer: Medicare Other | Source: Ambulatory Visit

## 2018-08-07 ENCOUNTER — Encounter (HOSPITAL_COMMUNITY): Payer: Self-pay

## 2018-08-07 ENCOUNTER — Inpatient Hospital Stay: Payer: Medicare Other

## 2018-08-07 ENCOUNTER — Other Ambulatory Visit: Payer: Self-pay

## 2018-08-07 ENCOUNTER — Ambulatory Visit (HOSPITAL_COMMUNITY)
Admission: RE | Admit: 2018-08-07 | Discharge: 2018-08-07 | Disposition: A | Discharge status: Home / Self Care | Payer: Medicare Other | Source: Ambulatory Visit | Attending: Internal Medicine | Admitting: Internal Medicine

## 2018-08-07 DIAGNOSIS — K219 Gastro-esophageal reflux disease without esophagitis: Secondary | ICD-10-CM

## 2018-08-07 DIAGNOSIS — I2510 Atherosclerotic heart disease of native coronary artery without angina pectoris: Secondary | ICD-10-CM

## 2018-08-07 DIAGNOSIS — F329 Major depressive disorder, single episode, unspecified: Secondary | ICD-10-CM

## 2018-08-07 DIAGNOSIS — Z8249 Family history of ischemic heart disease and other diseases of the circulatory system: Secondary | ICD-10-CM

## 2018-08-07 DIAGNOSIS — C3490 Malignant neoplasm of unspecified part of unspecified bronchus or lung: Secondary | ICD-10-CM | POA: Insufficient documentation

## 2018-08-07 DIAGNOSIS — C383 Malignant neoplasm of mediastinum, part unspecified: Secondary | ICD-10-CM | POA: Diagnosis not present

## 2018-08-07 DIAGNOSIS — I10 Essential (primary) hypertension: Secondary | ICD-10-CM

## 2018-08-07 DIAGNOSIS — I252 Old myocardial infarction: Secondary | ICD-10-CM | POA: Insufficient documentation

## 2018-08-07 DIAGNOSIS — Z952 Presence of prosthetic heart valve: Secondary | ICD-10-CM

## 2018-08-07 DIAGNOSIS — E7800 Pure hypercholesterolemia, unspecified: Secondary | ICD-10-CM

## 2018-08-07 DIAGNOSIS — C7802 Secondary malignant neoplasm of left lung: Secondary | ICD-10-CM | POA: Insufficient documentation

## 2018-08-07 DIAGNOSIS — Z951 Presence of aortocoronary bypass graft: Secondary | ICD-10-CM | POA: Insufficient documentation

## 2018-08-07 DIAGNOSIS — I739 Peripheral vascular disease, unspecified: Secondary | ICD-10-CM

## 2018-08-07 DIAGNOSIS — F17210 Nicotine dependence, cigarettes, uncomplicated: Secondary | ICD-10-CM

## 2018-08-07 DIAGNOSIS — R51 Headache: Secondary | ICD-10-CM | POA: Diagnosis not present

## 2018-08-07 DIAGNOSIS — A419 Sepsis, unspecified organism: Secondary | ICD-10-CM | POA: Diagnosis not present

## 2018-08-07 DIAGNOSIS — T80211A Bloodstream infection due to central venous catheter, initial encounter: Secondary | ICD-10-CM | POA: Diagnosis not present

## 2018-08-07 DIAGNOSIS — I871 Compression of vein: Secondary | ICD-10-CM | POA: Diagnosis not present

## 2018-08-07 DIAGNOSIS — F419 Anxiety disorder, unspecified: Secondary | ICD-10-CM | POA: Insufficient documentation

## 2018-08-07 DIAGNOSIS — C7801 Secondary malignant neoplasm of right lung: Secondary | ICD-10-CM

## 2018-08-07 DIAGNOSIS — M1990 Unspecified osteoarthritis, unspecified site: Secondary | ICD-10-CM | POA: Insufficient documentation

## 2018-08-07 LAB — PROTIME-INR
INR: 1.29
Prothrombin Time: 15.9 seconds — ABNORMAL HIGH (ref 11.4–15.2)

## 2018-08-07 LAB — BASIC METABOLIC PANEL
Anion gap: 11 (ref 5–15)
BUN: 11 mg/dL (ref 8–23)
CALCIUM: 9.9 mg/dL (ref 8.9–10.3)
CO2: 25 mmol/L (ref 22–32)
CREATININE: 0.5 mg/dL (ref 0.44–1.00)
Chloride: 103 mmol/L (ref 98–111)
GFR calc non Af Amer: >60 mL/min (ref >60)
Glucose, Bld: 116 mg/dL — ABNORMAL HIGH (ref 70–99)
Potassium: 4.6 mmol/L (ref 3.5–5.1)
Sodium: 139 mmol/L (ref 135–145)

## 2018-08-07 LAB — CBC
HCT: 38.5 % (ref 36.0–46.0)
Hemoglobin: 10.9 g/dL — ABNORMAL LOW (ref 12.0–15.0)
MCH: 27.8 pg (ref 26.0–34.0)
MCHC: 28.3 g/dL — ABNORMAL LOW (ref 30.0–36.0)
MCV: 98.2 fL (ref 80.0–100.0)
NRBC: 0.3 % — AB (ref 0.0–0.2)
Platelets: 182 10*3/uL (ref 150–400)
RBC: 3.92 MIL/uL (ref 3.87–5.11)
RDW: 15.2 % (ref 11.5–15.5)
WBC: 10.6 10*3/uL — ABNORMAL HIGH (ref 4.0–10.5)

## 2018-08-07 MED ORDER — SODIUM CHLORIDE 0.9 % IV SOLN: INTRAVENOUS | Status: DC

## 2018-08-07 MED ORDER — HEPARIN SOD (PORK) LOCK FLUSH 100 UNIT/ML IV SOLN
INTRAVENOUS | Status: AC
  Filled 2018-08-07: qty 5

## 2018-08-07 MED ORDER — IOPAMIDOL (ISOVUE-300) INJECTION 61%
INTRAVENOUS | Status: AC
  Filled 2018-08-07: qty 50

## 2018-08-07 MED ORDER — LIDOCAINE HCL (PF) 1 % IJ SOLN
INTRAMUSCULAR | Status: DC | PRN
  Administered 2018-08-07: 5 mL

## 2018-08-07 MED ORDER — CEFAZOLIN SODIUM-DEXTROSE 2-4 GM/100ML-% IV SOLN: 2.0000 g | INTRAVENOUS | Status: DC

## 2018-08-07 MED ORDER — LIDOCAINE HCL 1 % IJ SOLN
INTRAMUSCULAR | Status: AC
  Filled 2018-08-07: qty 20

## 2018-08-07 NOTE — Procedures (Signed)
Placement of right arm basilic Power PICC.  Single lumen, 35 cm, tip at SVC/ RA junction.  Minimal blood loss and no immediate complication.  See full report in IMAGING.

## 2018-08-07 NOTE — H&P (Signed)
Chief Complaint: Patient was seen in consultation today for [facility] a cath placement at the request of [facility]  Referring Physician(s): Mohamed,Mohamed  Supervising Physician: Markus Daft  Patient Status: [facility] - Out-pt  History of Present Illness: MINTA FAIR is a 77 y.o. female   Cough and SOB for months ++ smoker Also complained of left chest wall enlarging mass  CT 10/11/22/17: IMPRESSION: 1. 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 The subcutaneous midline ventral chest wall portion of the mass should be amenable to ultrasound-guided core biopsy. 2. Extrinsic narrowing and possible direct invasion of the left brachiocephalic vein and upper SVC by the mass. Encasement of the right coronary artery by the mass. 3. New metastatic bilateral mediastinal adenopathy. 4. 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 5. New left adrenal nodule suspicious for adrenal metastasis. 6. Trace dependent left pleural effusion. 7. Stable ectatic 4.3 cm ascending thoracic aorta.  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 Mass-effect upon the fourth ventricle but without evidence of obstructive hydrocephalus. 2 cm necrotic right thalamic metastasis. Numerous bilateral necrotic cerebral hemispheric metastases as described above. No evidence of midline shift or herniation.  Scheduled now for [facility] a cath placement Pt with known poor venous access Difficult stick Per Dr Julien Nordmann note-- if cannot get PAC-- please place PICC  Past Medical History:  Diagnosis Date  . Abdominal pain, unspecified site   . Anemia    Iron deficiency anemia  . Anxiety   . Aortic stenosis   . Arthritis   . CAD (coronary artery disease)    psot bypass x2 to diagonal and obtuse marginal. SVG graft-01/08/2009  . Depression   . Dyspnea   . GERD (gastroesophageal reflux disease)   . History of cardiovascular stress test    Myoview 7/16:  Low risk, no ischemia or scar, EF 72%  . History of echocardiogram    Echo 7/16:  EF 55-60%, no RWMA, Gr 2 DD, AVR ok (mean 20 mmHg), PASP 40 mmHg  . History of GI bleed    With AVM  . Hypercholesterolemia   . Hyperlipidemia   . Hypertension   . Iron deficiency anemia   . Myocardial infarction ([place]) 2010  . Neuromuscular disorder (HCC)    numbness in hands  . Peripheral vascular disease ([place])    righ tleg  . Shoulder impingement syndrome    Right shoulder    Past Surgical History:  Procedure Laterality Date  . ABDOMINAL HYSTERECTOMY    . AORTIC VALVE REPLACEMENT     [place] #[id number]  . CARDIAC CATHETERIZATION N/A 08/11/2015   Procedure: Right Heart Cath;  Surgeon: Jolaine Artist, MD;  Location: [facility];  Service: Cardiovascular;  Laterality: N/A;  . CARDIAC SURGERY  2010   , 2 blockages  . CHOLECYSTECTOMY    . COLONOSCOPY N/A 04/12/2017   Procedure: COLONOSCOPY;  Surgeon: Manus Gunning, MD;  Location: [facility] ENDOSCOPY;  Service: Gastroenterology;  Laterality: N/A;  . CORONARY ARTERY BYPASS GRAFT    . ENTEROSCOPY N/A 10/01/2017   Procedure: ENTEROSCOPY;  Surgeon: Irene Shipper, MD;  Location: [facility] ENDOSCOPY;  Service: Endoscopy;  Laterality: N/A;  . ESOPHAGOGASTRODUODENOSCOPY N/A 04/12/2017   Procedure: ESOPHAGOGASTRODUODENOSCOPY (EGD);  Surgeon: Manus Gunning, MD;  Location: [place];  Service: Gastroenterology;  Laterality: N/A;  . GIVENS CAPSULE STUDY N/A 02/23/2018   Procedure: GIVENS CAPSULE STUDY;  Surgeon: Milus Banister, MD;  Location: [facility] ENDOSCOPY;  Service: Endoscopy;  Laterality: N/A;  . HEEL SPUR EXCISION  2000   left heel  . SPINE  SURGERY  2003   Herniated diskectomy  . TOTAL HIP ARTHROPLASTY Right 08/22/2017   Procedure: RIGHT TOTAL HIP ARTHROPLASTY ANTERIOR APPROACH;  Surgeon: Mcarthur Rossetti, MD;  Location: [facility];  Service: Orthopedics;  Laterality: Right;  . WRIST SURGERY     right wrist    Allergies: Codeine and Lorazepam  Medications: Prior to Admission medications   Medication Sig Start Date End Date Taking? Authorizing Provider  acetaminophen (TYLENOL) 325 MG tablet Take 2 tablets (650 mg total) by mouth every 6 (six) hours as needed for mild pain (or Fever >/= 101). 04/12/17  Yes Rosita Fire, MD  albuterol (PROVENTIL) (2.5 MG/3ML) 0.083% nebulizer solution Take 3 mLs (2.5 mg total) by nebulization every 6 (six) hours as needed for wheezing or shortness of breath. 07/27/18  Yes Juanito Doom, MD  amLODipine (NORVASC) 10 MG tablet Take 10 mg by mouth daily.   Yes [provider]  atorvastatin (LIPITOR) 80 MG tablet TAKE 1 TABLET BY MOUTH EVERY DAY Patient taking differently: Take 80 mg by mouth daily.  08/06/17  Yes Skains, Thana Farr, MD  BYSTOLIC 5 MG tablet TAKE 1 TABLET BY MOUTH EVERY DAY Patient taking differently: Take 5 mg by mouth daily.  03/31/18  Yes Jerline Pain, MD  Calcium Carbonate-Vitamin D (CALCIUM 600+D) 600-400 MG-UNIT per tablet Take 1 tablet by mouth daily.   Yes [provider]  dexamethasone (DECADRON) 4 MG tablet Take 1 tablet (4 mg total) by mouth 2 (two) times daily. Patient taking differently: Take 4 mg by mouth 3 (three) times daily.  07/31/18  Yes Curt Bears, MD  ferrous sulfate 325 (65 FE) MG tablet Take 1 tablet (325 mg total) by mouth 2 (two) times daily with a meal. 10/03/17  Yes Rosita Fire, MD  Multiple Vitamin (MULTIVITAMIN) tablet Take 1 tablet by mouth daily.   Yes [provider]  omeprazole (PRILOSEC) 40 MG capsule Take 1 capsule (40 mg total) by mouth 2 (two) times daily. Patient taking differently: Take 40 mg  by mouth daily.  04/12/17  Yes Rosita Fire, MD  potassium chloride (MICRO-K) 10 MEQ CR capsule TAKE 2 CAPSULES BY MOUTH TWICE A DAY Patient taking differently: Take 20 mEq by mouth 2 (two) times daily.  09/15/17  Yes Jerline Pain, MD  prochlorperazine (COMPAZINE) 10 MG tablet Take 1 tablet (10 mg total) by mouth every 6 (six) hours as needed for nausea or vomiting. 07/31/18  Yes Curt Bears, MD  tiotropium (SPIRIVA HANDIHALER) 18 MCG inhalation capsule Place 1 capsule (18 mcg total) into inhaler and inhale every morning. 07/27/18  Yes Juanito Doom, MD  traMADol (ULTRAM) 50 MG tablet Take 50 mg by mouth 2 (two) times daily as needed for moderate pain.  04/28/18  Yes [provider]  chlorpheniramine-HYDROcodone (TUSSIONEX PENNKINETIC ER) 10-8 MG/5ML SUER Take 5 mLs by mouth every 12 (twelve) hours as needed for cough. Patient not taking: Reported on 08/06/2018 07/27/18   Juanito Doom, MD     Family History  Problem Relation Age of Onset  . Hypertension Mother   . Kidney disease Mother   . Hypertension  Father   . Hypertension Sister   . Hypertension Brother   . Hypertension Daughter   . Diabetes Brother   . Heart attack Neg Hx   . Stroke Neg Hx     Social History   Socioeconomic History  . Marital status: Single    Spouse name: Not on file  . Number of children: 3  . Years of education: Not on file  . Highest education level: Not on file  Occupational History  . Occupation: Retired  Scientific laboratory technician  . Financial resource strain: Not on file  . Food insecurity:    Worry: Not on file    Inability: Not on file  . Transportation needs:    Medical: Yes    Non-medical: Yes  Tobacco Use  . Smoking status: Current Every Day Smoker    Packs/day: 1.00    Years: 52.00    Pack years: 52.00    Types: Cigarettes  . Smokeless tobacco: Never Used  . Tobacco comment: smokes 1/2 pack per day  Substance and Sexual Activity  . Alcohol use: No    Alcohol/week:  0.0 standard drinks  . Drug use: No  . Sexual activity: Not Currently  Lifestyle  . Physical activity:    Days per week: Not on file    Minutes per session: Not on file  . Stress: Not on file  Relationships  . Social connections:    Talks on phone: Not on file    Gets together: Not on file    Attends religious service: Not on file    Active member of club or organization: Not on file    Attends meetings of clubs or organizations: Not on file    Relationship status: Not on file  Other Topics Concern  . Not on file  Social History Narrative  . Not on file    Review of Systems: A 12 point ROS discussed and pertinent positives are indicated in the HPI above.  All other systems are negative.  Review of Systems  Constitutional: Positive for activity change, appetite change and fatigue. Negative for fever.  Respiratory: Positive for cough and shortness of breath.   Cardiovascular: Negative for chest pain.  Gastrointestinal: Negative for abdominal pain.  Neurological: Positive for weakness.  Psychiatric/Behavioral: Positive for confusion and decreased concentration. Negative for behavioral problems.    Vital Signs: BP 134/68   Pulse (!) 58   Temp (!) 97.4 F (36.3 C)   Resp 20   Ht 4' (1.219 m)   Wt 135 lb (61.2 kg)   LMP  (LMP Unknown)   SpO2 94%   BMI 41.20 kg/m   Physical Exam  Cardiovascular: Normal rate.  Murmur heard. Pulmonary/Chest: Effort normal and breath sounds normal.  Abdominal: Soft. Bowel sounds are normal.  Musculoskeletal: Normal range of motion.  Neurological: She is alert.  Skin: Skin is warm and dry.  Psychiatric: She has a normal mood and affect. Her behavior is normal. Judgment and thought content normal.  Vitals reviewed.   Imaging: Mr Jeri Cos ZO Contrast  Result Date: 08/01/2018 CLINICAL DATA:  New diagnosis small cell lung cancer.  Staging. EXAM: MRI HEAD WITHOUT AND WITH CONTRAST TECHNIQUE: Multiplanar, multiecho pulse sequences of the  brain and surrounding structures were obtained without and with intravenous contrast. CONTRAST:  6 cc Gadavist COMPARISON:  None. FINDINGS: Brain: The study suffers from considerable motion degradation. Contrast administration failed. There is no evidence of circulating contrast despite description of injection. Cerebellum: 3 cerebellar metastases with  necrosis. Left cerebellar metastasis measuring 3.5 cm in diameter. Right cerebellar metastasis measuring 2.2 cm in diameter. More inferior right cerebellar metastasis measuring 1.5 cm in diameter. Mass-effect upon the fourth ventricle but no evidence of ventricular obstruction. Right cerebral hemisphere: 2 cm necrotic right thalamic metastasis. 2.1 cm necrotic right frontal metastasis. Moderate vasogenic edema in the region of the frontal metastasis. Left cerebral hemisphere: 2 adjacent posterior temporal metastases, each about 1 cm in size. Possible punctate metastasis in the deep white matter adjacent to the posterior body of the left lateral ventricle. Left frontal necrotic metastasis measuring 13 mm in size. Vascular: Major vessels at the base of the brain show flow. Skull and upper cervical spine: Negative Sinuses/Orbits: Mucosal inflammatory changes affecting all of the paranasal sinuses. Orbits negative. Other: None IMPRESSION: The examination suffers from severe motion degradation. Additionally, despite supposed contrast administration, no contrast is visible on this study. Depending upon treatment choices, 1 might want to repeat this study if the patient can be in better condition and better venous access can be achieved. Multiple necrotic metastases as outlined above. Three cerebellar metastases, the largest on the left measuring 3.5 cm in diameter. Mass-effect upon the fourth ventricle but without evidence of obstructive hydrocephalus. 2 cm necrotic right thalamic metastasis. Numerous bilateral necrotic cerebral hemispheric metastases as described above. No  evidence of midline shift or herniation. Electronically Signed   By: Nelson Chimes M.D.   On: 08/01/2018 12:54   Ct Biopsy  Result Date: 07/20/2018 INDICATION: 77 year old female with an infiltrative mediastinal mass encasing the sternum and extending into the superficial soft tissues of the anterior chest wall. She presents for CT-guided biopsy of the same to establish tissue diagnosis. EXAM: CT-guided biopsy, chest wall mass MEDICATIONS: None. ANESTHESIA/SEDATION: Moderate (conscious) sedation was employed during this procedure. A total of Versed 1.5 mg and Fentanyl 50 mcg was administered intravenously. Moderate Sedation Time: 10 minutes. The patient's level of consciousness and vital signs were monitored continuously by radiology nursing throughout the procedure under my direct supervision. FLUOROSCOPY TIME:  Fluoroscopy Time: 0 minutes 0 seconds (0 mGy). COMPLICATIONS: None immediate. PROCEDURE: Informed written consent was obtained from the patient after a thorough discussion of the procedural risks, benefits and alternatives. All questions were addressed. A timeout was performed prior to the initiation of the procedure. A planning axial CT scan was performed. The infiltrative soft tissue mass surrounding the sternum was successfully identified. A suitable skin entry site was selected and marked. The overlying skin was prepped and draped in the standard sterile fashion using chlorhexidine skin prep. Local anesthesia was attained by infiltration with 1% lidocaine. A small dermatotomy was made. Using intermittent CT guidance, a 17 gauge introducer needle was carefully advanced into the margin of the superficial component of the mass. Multiple 18 gauge core biopsies were then coaxially obtained using the bio Pince automated biopsy device. Biopsy specimens were placed in both formalin and saline and delivered to pathology for further analysis. The saline is to insure that flow cytometry can be performed if  necessary. Following biopsy, the introducer needle was removed. Follow-up CT imaging demonstrates no evidence of immediate complication. IMPRESSION: Successful CT-guided biopsy of anterior chest wall mass. Electronically Signed   By: Jacqulynn Cadet M.D.   On: 07/20/2018 10:16   Vas [place] Upper Extremity Venous Duplex  Result Date: 07/31/2018 UPPER VENOUS STUDY  Indications: Swelling Performing Technologist: Oliver Hum RVT  Examination Guidelines: A complete evaluation includes B-mode imaging, spectral Doppler, color Doppler, and power Doppler as  needed of all accessible portions of each vessel. Bilateral testing is considered an integral part of a complete examination. Limited examinations for reoccurring indications may be performed as noted.  Right Findings: +----------+------------+----------+---------+-----------+-------+ RIGHT     CompressiblePropertiesPhasicitySpontaneousSummary +----------+------------+----------+---------+-----------+-------+ Subclavian    Full                 Yes       Yes            +----------+------------+----------+---------+-----------+-------+  Left Findings: +----------+------------+----------+---------+-----------+-------+ LEFT      CompressiblePropertiesPhasicitySpontaneousSummary +----------+------------+----------+---------+-----------+-------+ IJV           Full                 Yes       Yes            +----------+------------+----------+---------+-----------+-------+ Subclavian    Full                 Yes       Yes            +----------+------------+----------+---------+-----------+-------+ Axillary      Full                 Yes       Yes            +----------+------------+----------+---------+-----------+-------+ Brachial      Full                 Yes       Yes            +----------+------------+----------+---------+-----------+-------+ Radial        Full                                           +----------+------------+----------+---------+-----------+-------+ Ulnar         Full                                          +----------+------------+----------+---------+-----------+-------+ Cephalic      Full                                          +----------+------------+----------+---------+-----------+-------+ Basilic       Full                                          +----------+------------+----------+---------+-----------+-------+  Summary:  Right: No evidence of thrombosis in the subclavian.  Left: No evidence of deep vein thrombosis in the upper extremity. No evidence of superficial vein thrombosis in the upper extremity.  *See table(s) above for measurements and observations.  Diagnosing physician: Monica Martinez MD Electronically signed by Monica Martinez MD on 07/31/2018 at 5:06:06 PM.    Final     Labs:  CBC: Recent Labs    06/16/18 1239 07/20/18 0740 07/31/18 0848 08/04/18 1016  WBC 6.6 5.0 6.3 9.1  HGB 11.4 10.7* 10.4* 10.7*  HCT 35.5 36.4 36.3 36.2  PLT 218 153 176 210    COAGS: Recent Labs    09/30/17 0135 07/20/18 0740  INR 1.38 1.13  APTT 28 31  BMP: Recent Labs    02/12/18 1244 02/12/18 1250 06/16/18 1239 07/31/18 0848 08/04/18 1016  NA 142 143 145* 145 143  K 3.6 3.6 4.7 3.8 4.0  CL 109 107 107* 110 107  CO2 20*  --  21 23 26   GLUCOSE 101* 97 83 117* 142*  BUN 20 16 7* 8 8  CALCIUM 9.0  --  10.4* 9.5 9.6  CREATININE 0.75 0.70 0.60 0.77 0.66  GFRNONAA >60  --  88 >60 >60  GFRAA >60  --  102 >60 >60    LIVER FUNCTION TESTS: Recent Labs    09/29/17 2244 02/12/18 1244 07/31/18 0848 08/04/18 1016  BILITOT 0.4 0.4 0.5 0.5  AST 67* 25 31 26   ALT 46 14 10 15   ALKPHOS 72 75 83 76  PROT 6.1* 7.1 7.8 7.7  ALBUMIN 2.9* 3.2* 3.7 3.7    TUMOR MARKERS: No results for input(s): AFPTM, CEA, CA199, CHROMGRNA in the last 8760 hours.  Assessment and Plan:  Small cell lung Cancer Brain metastasis Has started  Radiation-- to start chemo soon Scheduled for Port a cath placement--- or Peripherally inserted central catheter if need Risks and benefits of image guided port-a-catheter placement was discussed with the patient including, but not limited to bleeding, infection, pneumothorax, or fibrin sheath development and need for additional procedures.  All of the patient's questions were answered, patient is agreeable to proceed. Consent signed and in chart.   Thank you for this interesting consult.  I greatly enjoyed meeting RANDIE BLOODGOOD and look forward to participating in their care.  A copy of this report was sent to the requesting provider on this date.  Electronically Signed: Lavonia Drafts, PA-C 08/07/2018, 1:23 PM   I spent a total of  30 Minutes   in face to face in clinical consultation, greater than 50% of which was counseling/coordinating care for [facility] or PICC

## 2018-08-08 ENCOUNTER — Ambulatory Visit: Payer: Medicare Other

## 2018-08-10 ENCOUNTER — Inpatient Hospital Stay (HOSPITAL_BASED_OUTPATIENT_CLINIC_OR_DEPARTMENT_OTHER): Payer: Medicare Other | Admitting: Medical

## 2018-08-10 ENCOUNTER — Inpatient Hospital Stay: Payer: Medicare Other

## 2018-08-10 ENCOUNTER — Emergency Department (HOSPITAL_COMMUNITY): Payer: Medicare Other

## 2018-08-10 ENCOUNTER — Other Ambulatory Visit: Payer: Self-pay

## 2018-08-10 ENCOUNTER — Other Ambulatory Visit: Payer: Self-pay | Admitting: Internal Medicine

## 2018-08-10 ENCOUNTER — Encounter (HOSPITAL_COMMUNITY): Payer: Self-pay

## 2018-08-10 ENCOUNTER — Inpatient Hospital Stay (HOSPITAL_COMMUNITY)
Admission: EM | Admit: 2018-08-10 | Discharge: 2018-08-23 | DRG: 314 | Disposition: E | Payer: Medicare Other | Source: Other Acute Inpatient Hospital | Attending: Internal Medicine | Admitting: Internal Medicine

## 2018-08-10 ENCOUNTER — Ambulatory Visit
Admission: RE | Admit: 2018-08-10 | Discharge: 2018-08-10 | Disposition: A | Discharge status: Home / Self Care | Payer: Medicare Other | Source: Ambulatory Visit | Attending: Radiation Oncology | Admitting: Radiation Oncology

## 2018-08-10 VITALS — BP 134/100 | HR 95 | Temp 100.0°F | Resp 26

## 2018-08-10 DIAGNOSIS — Z9221 Personal history of antineoplastic chemotherapy: Secondary | ICD-10-CM

## 2018-08-10 DIAGNOSIS — G9341 Metabolic encephalopathy: Secondary | ICD-10-CM | POA: Diagnosis not present

## 2018-08-10 DIAGNOSIS — Z90710 Acquired absence of both cervix and uterus: Secondary | ICD-10-CM

## 2018-08-10 DIAGNOSIS — Z8249 Family history of ischemic heart disease and other diseases of the circulatory system: Secondary | ICD-10-CM

## 2018-08-10 DIAGNOSIS — D509 Iron deficiency anemia, unspecified: Secondary | ICD-10-CM | POA: Diagnosis present

## 2018-08-10 DIAGNOSIS — R4182 Altered mental status, unspecified: Secondary | ICD-10-CM

## 2018-08-10 DIAGNOSIS — C3490 Malignant neoplasm of unspecified part of unspecified bronchus or lung: Secondary | ICD-10-CM

## 2018-08-10 DIAGNOSIS — G8929 Other chronic pain: Secondary | ICD-10-CM | POA: Diagnosis present

## 2018-08-10 DIAGNOSIS — R6521 Severe sepsis with septic shock: Secondary | ICD-10-CM | POA: Diagnosis present

## 2018-08-10 DIAGNOSIS — Z66 Do not resuscitate: Secondary | ICD-10-CM | POA: Diagnosis present

## 2018-08-10 DIAGNOSIS — D696 Thrombocytopenia, unspecified: Secondary | ICD-10-CM | POA: Diagnosis present

## 2018-08-10 DIAGNOSIS — I739 Peripheral vascular disease, unspecified: Secondary | ICD-10-CM | POA: Diagnosis present

## 2018-08-10 DIAGNOSIS — M1990 Unspecified osteoarthritis, unspecified site: Secondary | ICD-10-CM | POA: Diagnosis present

## 2018-08-10 DIAGNOSIS — I2510 Atherosclerotic heart disease of native coronary artery without angina pectoris: Secondary | ICD-10-CM | POA: Diagnosis present

## 2018-08-10 DIAGNOSIS — E872 Acidosis: Secondary | ICD-10-CM | POA: Diagnosis present

## 2018-08-10 DIAGNOSIS — Z952 Presence of prosthetic heart valve: Secondary | ICD-10-CM

## 2018-08-10 DIAGNOSIS — F329 Major depressive disorder, single episode, unspecified: Secondary | ICD-10-CM | POA: Diagnosis present

## 2018-08-10 DIAGNOSIS — Z951 Presence of aortocoronary bypass graft: Secondary | ICD-10-CM

## 2018-08-10 DIAGNOSIS — C3491 Malignant neoplasm of unspecified part of right bronchus or lung: Secondary | ICD-10-CM

## 2018-08-10 DIAGNOSIS — J189 Pneumonia, unspecified organism: Secondary | ICD-10-CM | POA: Diagnosis present

## 2018-08-10 DIAGNOSIS — E785 Hyperlipidemia, unspecified: Secondary | ICD-10-CM | POA: Diagnosis present

## 2018-08-10 DIAGNOSIS — F17210 Nicotine dependence, cigarettes, uncomplicated: Secondary | ICD-10-CM | POA: Diagnosis present

## 2018-08-10 DIAGNOSIS — G936 Cerebral edema: Secondary | ICD-10-CM | POA: Diagnosis present

## 2018-08-10 DIAGNOSIS — A419 Sepsis, unspecified organism: Secondary | ICD-10-CM | POA: Diagnosis present

## 2018-08-10 DIAGNOSIS — C7931 Secondary malignant neoplasm of brain: Secondary | ICD-10-CM

## 2018-08-10 DIAGNOSIS — T80211A Bloodstream infection due to central venous catheter, initial encounter: Principal | ICD-10-CM | POA: Diagnosis present

## 2018-08-10 DIAGNOSIS — Z888 Allergy status to other drugs, medicaments and biological substances status: Secondary | ICD-10-CM

## 2018-08-10 DIAGNOSIS — Z9225 Personal history of immunosupression therapy: Secondary | ICD-10-CM

## 2018-08-10 DIAGNOSIS — R809 Proteinuria, unspecified: Secondary | ICD-10-CM | POA: Diagnosis present

## 2018-08-10 DIAGNOSIS — R509 Fever, unspecified: Secondary | ICD-10-CM

## 2018-08-10 DIAGNOSIS — K219 Gastro-esophageal reflux disease without esophagitis: Secondary | ICD-10-CM | POA: Diagnosis present

## 2018-08-10 DIAGNOSIS — R6889 Other general symptoms and signs: Secondary | ICD-10-CM

## 2018-08-10 DIAGNOSIS — I10 Essential (primary) hypertension: Secondary | ICD-10-CM | POA: Diagnosis present

## 2018-08-10 DIAGNOSIS — Z79899 Other long term (current) drug therapy: Secondary | ICD-10-CM

## 2018-08-10 DIAGNOSIS — Z96641 Presence of right artificial hip joint: Secondary | ICD-10-CM | POA: Diagnosis present

## 2018-08-10 DIAGNOSIS — M7541 Impingement syndrome of right shoulder: Secondary | ICD-10-CM | POA: Diagnosis present

## 2018-08-10 DIAGNOSIS — M79606 Pain in leg, unspecified: Secondary | ICD-10-CM | POA: Diagnosis present

## 2018-08-10 DIAGNOSIS — C3492 Malignant neoplasm of unspecified part of left bronchus or lung: Secondary | ICD-10-CM

## 2018-08-10 DIAGNOSIS — J9601 Acute respiratory failure with hypoxia: Secondary | ICD-10-CM | POA: Diagnosis present

## 2018-08-10 DIAGNOSIS — C383 Malignant neoplasm of mediastinum, part unspecified: Secondary | ICD-10-CM | POA: Diagnosis present

## 2018-08-10 DIAGNOSIS — I248 Other forms of acute ischemic heart disease: Secondary | ICD-10-CM | POA: Diagnosis present

## 2018-08-10 DIAGNOSIS — I871 Compression of vein: Secondary | ICD-10-CM

## 2018-08-10 DIAGNOSIS — Z9049 Acquired absence of other specified parts of digestive tract: Secondary | ICD-10-CM

## 2018-08-10 DIAGNOSIS — Z885 Allergy status to narcotic agent status: Secondary | ICD-10-CM

## 2018-08-10 DIAGNOSIS — I252 Old myocardial infarction: Secondary | ICD-10-CM

## 2018-08-10 LAB — PROTIME-INR
INR: 1.46
Prothrombin Time: 17.5 seconds — ABNORMAL HIGH (ref 11.4–15.2)

## 2018-08-10 LAB — URINALYSIS, ROUTINE W REFLEX MICROSCOPIC
BILIRUBIN URINE: NEGATIVE
Glucose, UA: NEGATIVE mg/dL
KETONES UR: NEGATIVE mg/dL
NITRITE: NEGATIVE
Specific Gravity, Urine: 1.018 (ref 1.005–1.030)
pH: 6 (ref 5.0–8.0)

## 2018-08-10 LAB — CBC WITH DIFFERENTIAL/PLATELET
ABS IMMATURE GRANULOCYTES: 2.04 10*3/uL — AB (ref 0.00–0.07)
BASOS ABS: 0.1 10*3/uL (ref 0.0–0.1)
Basophils Relative: 0 %
EOS ABS: 0.1 10*3/uL (ref 0.0–0.5)
Eosinophils Relative: 0 %
HEMATOCRIT: 34.4 % — AB (ref 36.0–46.0)
HEMOGLOBIN: 10.4 g/dL — AB (ref 12.0–15.0)
IMMATURE GRANULOCYTES: 10 %
LYMPHS ABS: 0 10*3/uL — AB (ref 0.7–4.0)
LYMPHS PCT: 0 %
MCH: 29.5 pg (ref 26.0–34.0)
MCHC: 30.2 g/dL (ref 30.0–36.0)
MCV: 97.7 fL (ref 80.0–100.0)
Monocytes Absolute: 0.2 10*3/uL (ref 0.1–1.0)
Monocytes Relative: 1 %
NEUTROS PCT: 89 %
NRBC: 0 % (ref 0.0–0.2)
Neutro Abs: 18.1 10*3/uL — ABNORMAL HIGH (ref 1.7–7.7)
Platelets: 64 10*3/uL — ABNORMAL LOW (ref 150–400)
RBC: 3.52 MIL/uL — ABNORMAL LOW (ref 3.87–5.11)
RDW: 15.4 % (ref 11.5–15.5)
WBC: 20.5 10*3/uL — ABNORMAL HIGH (ref 4.0–10.5)

## 2018-08-10 LAB — COMPREHENSIVE METABOLIC PANEL
ALT: 20 U/L (ref 0–44)
AST: 45 U/L — AB (ref 15–41)
Albumin: 3.5 g/dL (ref 3.5–5.0)
Alkaline Phosphatase: 116 U/L (ref 38–126)
Anion gap: 12 (ref 5–15)
BILIRUBIN TOTAL: 1.2 mg/dL (ref 0.3–1.2)
BUN: 14 mg/dL (ref 8–23)
CHLORIDE: 97 mmol/L — AB (ref 98–111)
CO2: 27 mmol/L (ref 22–32)
CREATININE: 0.69 mg/dL (ref 0.44–1.00)
Calcium: 8.8 mg/dL — ABNORMAL LOW (ref 8.9–10.3)
GFR calc Af Amer: >60 mL/min (ref >60)
Glucose, Bld: 160 mg/dL — ABNORMAL HIGH (ref 70–99)
POTASSIUM: 5.3 mmol/L — AB (ref 3.5–5.1)
Sodium: 136 mmol/L (ref 135–145)
Total Protein: 7.3 g/dL (ref 6.5–8.1)

## 2018-08-10 LAB — I-STAT CG4 LACTIC ACID, ED
LACTIC ACID, VENOUS: 4.04 mmol/L — AB (ref 0.5–1.9)
Lactic Acid, Venous: 5.61 mmol/L (ref 0.5–1.9)

## 2018-08-10 LAB — APTT: APTT: 38 s — AB (ref 24–36)

## 2018-08-10 LAB — MRSA PCR SCREENING: MRSA by PCR: NEGATIVE

## 2018-08-10 LAB — LACTIC ACID, PLASMA: LACTIC ACID, VENOUS: 3.5 mmol/L — AB (ref 0.5–1.9)

## 2018-08-10 LAB — BRAIN NATRIURETIC PEPTIDE: B NATRIURETIC PEPTIDE 5: 749 pg/mL — AB (ref 0.0–100.0)

## 2018-08-10 LAB — TROPONIN I: TROPONIN I: 0.81 ng/mL — AB (ref <0.03)

## 2018-08-10 MED ORDER — IPRATROPIUM-ALBUTEROL 0.5-2.5 (3) MG/3ML IN SOLN
3.0000 mL | Freq: Once | RESPIRATORY_TRACT | Status: AC
  Administered 2018-08-10: 3 mL via RESPIRATORY_TRACT

## 2018-08-10 MED ORDER — SODIUM CHLORIDE 0.9 % IV SOLN
500.0000 mg | INTRAVENOUS | Status: DC
  Administered 2018-08-10: 500 mg via INTRAVENOUS
  Filled 2018-08-10: qty 500

## 2018-08-10 MED ORDER — ACETAMINOPHEN 325 MG PO TABS
650.0000 mg | ORAL_TABLET | Freq: Once | ORAL | Status: AC
  Administered 2018-08-10: 650 mg via ORAL
  Filled 2018-08-10: qty 2

## 2018-08-10 MED ORDER — MORPHINE SULFATE (PF) 2 MG/ML IV SOLN
INTRAVENOUS | Status: AC
  Administered 2018-08-10: 0.5 mg via INTRAVENOUS
  Filled 2018-08-10: qty 1

## 2018-08-10 MED ORDER — VANCOMYCIN HCL 10 G IV SOLR
1250.0000 mg | Freq: Once | INTRAVENOUS | Status: AC
  Administered 2018-08-10: 1250 mg via INTRAVENOUS
  Filled 2018-08-10: qty 1250

## 2018-08-10 MED ORDER — HYDROCORTISONE NA SUCCINATE PF 100 MG IJ SOLR
100.0000 mg | Freq: Four times a day (QID) | INTRAMUSCULAR | Status: DC
  Administered 2018-08-10: 100 mg via INTRAVENOUS
  Filled 2018-08-10: qty 2

## 2018-08-10 MED ORDER — SODIUM CHLORIDE 0.9 % IV BOLUS
500.0000 mL | Freq: Once | INTRAVENOUS | Status: AC
  Administered 2018-08-10: 500 mL via INTRAVENOUS

## 2018-08-10 MED ORDER — METRONIDAZOLE IN NACL 5-0.79 MG/ML-% IV SOLN
500.0000 mg | Freq: Three times a day (TID) | INTRAVENOUS | Status: DC
  Administered 2018-08-10: 500 mg via INTRAVENOUS
  Filled 2018-08-10: qty 100

## 2018-08-10 MED ORDER — SODIUM CHLORIDE 0.9 % IV BOLUS (SEPSIS)
1000.0000 mL | Freq: Once | INTRAVENOUS | Status: AC
  Administered 2018-08-10: 1000 mL via INTRAVENOUS

## 2018-08-10 MED ORDER — POLYETHYLENE GLYCOL 3350 17 G PO PACK: 17.0000 g | PACK | Freq: Every day | ORAL | Status: DC | PRN

## 2018-08-10 MED ORDER — SODIUM CHLORIDE 0.9 % IV SOLN
INTRAVENOUS | Status: DC
  Administered 2018-08-10: 21:00:00 via INTRAVENOUS

## 2018-08-10 MED ORDER — SODIUM CHLORIDE 0.9 % IV SOLN
1.0000 g | Freq: Two times a day (BID) | INTRAVENOUS | Status: DC
  Filled 2018-08-10: qty 1

## 2018-08-10 MED ORDER — ALBUTEROL SULFATE (2.5 MG/3ML) 0.083% IN NEBU: 2.5000 mg | INHALATION_SOLUTION | Freq: Four times a day (QID) | RESPIRATORY_TRACT | Status: DC | PRN

## 2018-08-10 MED ORDER — IPRATROPIUM-ALBUTEROL 0.5-2.5 (3) MG/3ML IN SOLN
3.0000 mL | Freq: Once | RESPIRATORY_TRACT | Status: AC
  Administered 2018-08-10: 3 mL via RESPIRATORY_TRACT
  Filled 2018-08-10: qty 3

## 2018-08-10 MED ORDER — IPRATROPIUM-ALBUTEROL 0.5-2.5 (3) MG/3ML IN SOLN: 3.0000 mL | RESPIRATORY_TRACT | Status: DC

## 2018-08-10 MED ORDER — SODIUM CHLORIDE 0.9% FLUSH
3.0000 mL | Freq: Two times a day (BID) | INTRAVENOUS | Status: DC
  Administered 2018-08-10: 3 mL via INTRAVENOUS

## 2018-08-10 MED ORDER — SODIUM CHLORIDE 0.9 % IV SOLN
2.0000 g | Freq: Once | INTRAVENOUS | Status: AC
  Administered 2018-08-10: 2 g via INTRAVENOUS
  Filled 2018-08-10: qty 2

## 2018-08-10 MED ORDER — ACETAMINOPHEN 325 MG PO TABS: 650.0000 mg | ORAL_TABLET | Freq: Four times a day (QID) | ORAL | Status: DC | PRN

## 2018-08-10 MED ORDER — ONDANSETRON HCL 4 MG PO TABS: 4.0000 mg | ORAL_TABLET | Freq: Four times a day (QID) | ORAL | Status: DC | PRN

## 2018-08-10 MED ORDER — PEGFILGRASTIM-CBQV 6 MG/0.6ML ~~LOC~~ SOSY
PREFILLED_SYRINGE | SUBCUTANEOUS | Status: AC
  Filled 2018-08-10: qty 0.6

## 2018-08-10 MED ORDER — SODIUM CHLORIDE 0.9 % IV BOLUS (SEPSIS): 1000.0000 mL | Freq: Once | INTRAVENOUS | Status: DC

## 2018-08-10 MED ORDER — ACETAMINOPHEN 650 MG RE SUPP: 650.0000 mg | Freq: Four times a day (QID) | RECTAL | Status: DC | PRN

## 2018-08-10 MED ORDER — DEXAMETHASONE SODIUM PHOSPHATE 10 MG/ML IJ SOLN
10.0000 mg | Freq: Four times a day (QID) | INTRAMUSCULAR | Status: DC
  Administered 2018-08-10: 10 mg via INTRAVENOUS
  Filled 2018-08-10: qty 1

## 2018-08-10 MED ORDER — VANCOMYCIN HCL 500 MG IV SOLR: 500.0000 mg | INTRAVENOUS | Status: DC

## 2018-08-10 MED ORDER — ONDANSETRON HCL 4 MG/2ML IJ SOLN: 4.0000 mg | Freq: Four times a day (QID) | INTRAMUSCULAR | Status: DC | PRN

## 2018-08-10 MED ORDER — PEGFILGRASTIM-CBQV 6 MG/0.6ML ~~LOC~~ SOSY
6.0000 mg | PREFILLED_SYRINGE | Freq: Once | SUBCUTANEOUS | Status: AC
  Administered 2018-08-10: 6 mg via SUBCUTANEOUS

## 2018-08-10 MED ORDER — DEXAMETHASONE SODIUM PHOSPHATE 10 MG/ML IJ SOLN: 6.0000 mg | Freq: Four times a day (QID) | INTRAMUSCULAR | Status: DC

## 2018-08-11 ENCOUNTER — Ambulatory Visit: Payer: Medicare Other

## 2018-08-11 LAB — BLOOD CULTURE ID PANEL (REFLEXED)
Acinetobacter baumannii: NOT DETECTED
CANDIDA GLABRATA: NOT DETECTED
Candida albicans: NOT DETECTED
Candida krusei: NOT DETECTED
Candida parapsilosis: NOT DETECTED
Candida tropicalis: NOT DETECTED
ENTEROBACTER CLOACAE COMPLEX: NOT DETECTED
ENTEROCOCCUS SPECIES: NOT DETECTED
Enterobacteriaceae species: NOT DETECTED
Escherichia coli: NOT DETECTED
Haemophilus influenzae: NOT DETECTED
Klebsiella oxytoca: NOT DETECTED
Klebsiella pneumoniae: NOT DETECTED
LISTERIA MONOCYTOGENES: NOT DETECTED
METHICILLIN RESISTANCE: NOT DETECTED
NEISSERIA MENINGITIDIS: NOT DETECTED
PROTEUS SPECIES: NOT DETECTED
Pseudomonas aeruginosa: NOT DETECTED
STAPHYLOCOCCUS SPECIES: DETECTED — AB
STREPTOCOCCUS AGALACTIAE: NOT DETECTED
STREPTOCOCCUS SPECIES: NOT DETECTED
Serratia marcescens: NOT DETECTED
Staphylococcus aureus (BCID): DETECTED — AB
Streptococcus pneumoniae: NOT DETECTED
Streptococcus pyogenes: NOT DETECTED

## 2018-08-11 LAB — URINE CULTURE

## 2018-08-11 NOTE — Progress Notes (Signed)
[name] presented to infusion today for chemotherapy.  Unfortunately no good IV access could be established.  Her chemotherapy was held with plans to place a PICC line.   The patient was reporting some shortness of breath and a cough. Her exam showed diffuse bibasilar crackles.  She was given an albuterol nebulizer x1.  Her examination after an albuterol nebulizer was given showed lungs that were clear to auscultation bilaterally without wheezes rales or rhonchi.  Review the patient's vital signs showed that her blood pressure was 138/83, her pulse was 79, her respirations were 18, her oxygen saturation was 94% on room air and her temperature was 98.9.  She will proceed with placement of a PICC line as scheduled and will return for chemotherapy as scheduled.  Sandi Mealy, MHS, PA-C Physicians Assistant

## 2018-08-11 NOTE — Progress Notes (Addendum)
When I arrived to pt rm her son, grandchildren, sister were bedside. Two daughters and another son joined [place] during the visit. The family is of [place] Me[place] background and wanted prayer which we had bedside of pt when all were present. They asked for next stepped. I confirmed that pt's nurse has family contact information. They did not know who they would use for final arrangements, therefore I gave them the number of patient placement. They will call and give that information when they make that decision.  Family was tearful and appropriately grieving. I provided compassionate grief support and pastoral presence. Chaplain Marlise Eves, MDiv  Family has decided to use [facility] in [place].

## 2018-08-12 ENCOUNTER — Other Ambulatory Visit: Payer: Medicare Other

## 2018-08-12 ENCOUNTER — Ambulatory Visit: Payer: Medicare Other

## 2018-08-12 ENCOUNTER — Ambulatory Visit: Payer: Medicare Other | Admitting: Nurse Practitioner

## 2018-08-13 ENCOUNTER — Ambulatory Visit: Payer: Medicare Other

## 2018-08-13 LAB — CULTURE, BLOOD (ROUTINE X 2): SPECIAL REQUESTS: ADEQUATE

## 2018-08-14 ENCOUNTER — Ambulatory Visit: Payer: Medicare Other

## 2018-08-16 ENCOUNTER — Ambulatory Visit: Payer: Medicare Other

## 2018-08-17 ENCOUNTER — Ambulatory Visit: Payer: Medicare Other

## 2018-08-17 ENCOUNTER — Encounter (HOSPITAL_COMMUNITY): Payer: Medicare Other

## 2018-08-18 ENCOUNTER — Ambulatory Visit: Payer: Medicare Other

## 2018-08-18 ENCOUNTER — Ambulatory Visit (HOSPITAL_COMMUNITY): Payer: Medicare Other

## 2018-08-19 ENCOUNTER — Ambulatory Visit: Payer: Medicare Other

## 2018-08-19 ENCOUNTER — Ambulatory Visit (HOSPITAL_COMMUNITY): Payer: Medicare Other

## 2018-08-19 ENCOUNTER — Other Ambulatory Visit: Payer: Medicare Other

## 2018-08-23 NOTE — Patient Instructions (Signed)
Pegfilgrastim injection What is this medicine? PEGFILGRASTIM (PEG fil gra stim) is a long-acting granulocyte colony-stimulating factor that stimulates the growth of neutrophils, a type of white blood cell important in the body's fight against infection. It is used to reduce the incidence of fever and infection in patients with certain types of cancer who are receiving chemotherapy that affects the bone marrow, and to increase survival after being exposed to high doses of radiation. This medicine may be used for other purposes; ask your health care provider or pharmacist if you have questions. COMMON BRAND NAME(S): Neulasta,Udenyca  What should I tell my health care provider before I take this medicine? They need to know if you have any of these conditions: -kidney disease -latex allergy -ongoing radiation therapy -sickle cell disease -skin reactions to acrylic adhesives (On-Body Injector only) -an unusual or allergic reaction to pegfilgrastim, filgrastim, other medicines, foods, dyes, or preservatives -pregnant or trying to get pregnant -breast-feeding How should I use this medicine? This medicine is for injection under the skin. If you get this medicine at home, you will be taught how to prepare and give the pre-filled syringe or how to use the On-body Injector. Refer to the patient Instructions for Use for detailed instructions. Use exactly as directed. Tell your healthcare provider immediately if you suspect that the On-body Injector may not have performed as intended or if you suspect the use of the On-body Injector resulted in a missed or partial dose. It is important that you put your used needles and syringes in a special sharps container. Do not put them in a trash can. If you do not have a sharps container, call your pharmacist or healthcare provider to get one. Talk to your pediatrician regarding the use of this medicine in children. While this drug may be prescribed for selected  conditions, precautions do apply. Overdosage: If you think you have taken too much of this medicine contact a poison control center or emergency room at once. NOTE: This medicine is only for you. Do not share this medicine with others. What if I miss a dose? It is important not to miss your dose. Call your doctor or health care professional if you miss your dose. If you miss a dose due to an On-body Injector failure or leakage, a new dose should be administered as soon as possible using a single prefilled syringe for manual use. What may interact with this medicine? Interactions have not been studied. Give your health care provider a list of all the medicines, herbs, non-prescription drugs, or dietary supplements you use. Also tell them if you smoke, drink alcohol, or use illegal drugs. Some items may interact with your medicine. This list may not describe all possible interactions. Give your health care provider a list of all the medicines, herbs, non-prescription drugs, or dietary supplements you use. Also tell them if you smoke, drink alcohol, or use illegal drugs. Some items may interact with your medicine. What should I watch for while using this medicine? You may need blood work done while you are taking this medicine. If you are going to need a MRI, CT scan, or other procedure, tell your doctor that you are using this medicine (On-Body Injector only). What side effects may I notice from receiving this medicine? Side effects that you should report to your doctor or health care professional as soon as possible: -allergic reactions like skin rash, itching or hives, swelling of the face, lips, or tongue -dizziness -fever -pain, redness, or irritation at   site where injected -pinpoint red spots on the skin -red or dark-brown urine -shortness of breath or breathing problems -stomach or side pain, or pain at the shoulder -swelling -tiredness -trouble passing urine or change in the amount of  urine Side effects that usually do not require medical attention (report to your doctor or health care professional if they continue or are bothersome): -bone pain -muscle pain This list may not describe all possible side effects. Call your doctor for medical advice about side effects. You may report side effects to FDA at 1-800-FDA-1088. Where should I keep my medicine? Keep out of the reach of children. Store pre-filled syringes in a refrigerator between 2 and 8 degrees C (36 and 46 degrees F). Do not freeze. Keep in carton to protect from light. Throw away this medicine if it is left out of the refrigerator for more than 48 hours. Throw away any unused medicine after the expiration date. NOTE: This sheet is a summary. It may not cover all possible information. If you have questions about this medicine, talk to your doctor, pharmacist, or health care provider.  2018 Elsevier/Gold Standard (2016-09-05 12:58:03)  

## 2018-08-23 NOTE — Progress Notes (Signed)
A consult was received from an ED physician for Vancomycin per pharmacy dosing.  The patient's profile has been reviewed for ht/wt/allergies/indication/available labs.   A one time order has been placed for Vancomycin 1250mg  per Rx, Cefepime 2gm x1 per ED MD.  Further antibiotics/pharmacy consults should be ordered by admitting physician if indicated.                       Thank you,  Minda Ditto PharmD Pager [phone] Aug 18, 2018, 1:15 PM

## 2018-08-23 NOTE — Consult Note (Signed)
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   Started on radiation and chemotherapy recently.  Palliative chemo/rad.   Here with sepsis, hypotension after 1 L fluid. Currently receiving 2nd liter of fluid.   Complicated by B mild pulm edema on CXR, chronic wheezing 2/2 COPD  History of present illness   PICC placed 08/07/18.  Receiving Udencya.  Found to be shaking with slurred speech with temp of 100 and sat 84% when arrived for treatment today.  Redness at PICC site, "unclamped" BP initially 134/100 down to 80s.  Increasing RR (30s). Temp 100.4 at noon.   Lactate increasing in ED.   Received 1 L NS bolus,  Vanc, cefepime.    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  Confused per family intermittenly, acutely confused again today.  Objective   Blood pressure (!) 80/43, pulse (!) 102, temperature 98.7 F (37.1 C), temperature source Oral, resp. rate (!) 23, weight 61.2 kg, SpO2 97 %.        Intake/Output Summary (Last 24 hours) at  2018/08/28 1759 Last data filed at 08-28-2018 1604 Gross per 24 hour  Intake 1350 ml  Output -  Net 1350 ml   Filed Weights   08/28/2018 1224  Weight: 61.2 kg    Examination: General: confused, not oriented, but awake and alert.  Mumbling.   HENT: scleral edema, perrl  Lungs: B minimal wheezes.  Cardiovascular:RRR no audible murmurs Abdomen: NT, ND, NBS Extremities: No edema currently.  Warmth and redness over PICC insertion site  Neuro: altered as above, not oriented.  Not able to cooperate with exam, but moving all four ext.   Skin warm and well perfused at this time  Resolved Hospital Problem list     Assessment & Plan:  Septic shock: lactic acidosis, elevated WBC, low grade fever.  Hypotensive in ED to 80s, but resolved spontaneously in ICU.  Likely PICC infection, though seems to have occurred very quickly as PICC only placed on 11/15.  Will also consider PNA.  Repeat CXR in am.   Give additional 1 L fluid now, judicious fluids given appearance of CXR and wheeze.   Checking trop and BNP, repeat lactate again.   Started on broad spectrum antibiotics today. Vanc, cefepime, azithro.   Will increase steroid dose to hydrocortisone 100q 6 for now can start tapering soon (was taking decadron 4mg  TID as outpatient, equiv to 320mg  hydrocort daily).   Femoral line placed given SVC tumor, recent difficulty with lines from RIJ, inability to place port (per family).  Needed for  access.  PICC removed. No need for pressors at this time, but may need pressors this evening.    Small cell carcinoma, anterior mediastinal mass, pulmonary and brain mets.   DNR/DNI  WBC 20.5 Plt 64 - thrombocytopenia, monitor closely.   Coags oK  Proteinuria (>300), few bact, many whites, 0-5 squames  Best practice:  Diet: NPO for now, ok for diet if mental status improves.    Pain/Anxiety/Delirium protocol (if indicated): na VAP protocol (if indicated): na DVT prophylaxis: hold given thrombocytopenia.   SCDs GI prophylaxis: na Glucose control: per Hospitalist Mobility: bed rest Code Status:DNR/DNI Family Communication: Spoke with daughter and sister Disposition:   Labs   CBC: Recent Labs  Lab 08/04/18 1016 08/07/18 1317 2018/08/21 1237  WBC 9.1 10.6* 20.5*  NEUTROABS 7.9*  --  18.1*  HGB 10.7* 10.9* 10.4*  HCT 36.2 38.5 34.4*  MCV 98.4 98.2 97.7  PLT 210 182 64*    Basic Metabolic Panel: Recent Labs  Lab 08/04/18 1016 08/07/18 1317 2018-08-21 1237  NA 143 139 136  K 4.0 4.6 5.3*  CL 107 103 97*  CO2 26 25 27   GLUCOSE 142* 116* 160*  BUN 8 11 14   CREATININE 0.66 0.50 0.69  CALCIUM 9.6 9.9 8.8*   GFR: Estimated Creatinine Clearance: 32.7 mL/min (by C-G formula based on SCr of 0.69 mg/dL). Recent Labs  Lab 08/04/18 1016 08/07/18 1317 08-21-18 1237 2018/08/21 1244 08/21/2018 1602  WBC 9.1 10.6* 20.5*  --   --   LATICACIDVEN  --   --   --  4.04* 5.61*    Liver Function Tests: Recent Labs  Lab 08/04/18 1016 08-21-18 1237  AST 26 45*  ALT 15 20  ALKPHOS 76 116  BILITOT 0.5 1.2  PROT 7.7 7.3  ALBUMIN 3.7 3.5   No results for input(s): LIPASE, AMYLASE in the last 168 hours. No results for input(s): AMMONIA in the last 168 hours.  ABG    Component Value Date/Time   PHART 7.442 (H) 12/29/2008 1743   PCO2ART 34.1 (L) 12/29/2008 1743   PO2ART 105.0 (H) 12/29/2008 1743   HCO3 25.4 (H) 08/11/2015 1240   TCO2 21 (L) 02/12/2018 1250   ACIDBASEDEF 3.0 (H) 08/11/2015 1235   O2SAT 56.0 08/11/2015 1240     Coagulation Profile: Recent Labs  Lab 08/07/18 1317 2018-08-21 1237  INR 1.29 1.46    Cardiac Enzymes: No results for input(s): CKTOTAL, CKMB, CKMBINDEX, TROPONINI in the last 168 hours.  HbA1C: Hgb A1c MFr Bld  Date/Time Value Ref Range Status  12/27/2008 10:56 AM  4.6 - 6.1 % Final   5.6 (NOTE) The ADA recommends the following therapeutic goal for glycemic control related to Hgb A1c measurement: Goal of therapy: <6.5 Hgb A1c  Reference: American  Diabetes Association: Clinical Practice Recommendations 2010, Diabetes Care, 2010, 33: (Suppl  1).    CBG: No results for input(s): GLUCAP in the last 168 hours.   CT head:  1. 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 Vasogenic edema adjacent to the left inferior cerebellar mass causing slight positive mass effect on the left lateral aspect of fourth ventricle without causing hydrocephalus. The largest intra-axial mass is in the left inferior cerebellar hemisphere measuring 2.3 cm. 2. Redemonstration of intra-axial masses in the right frontal white matter measuring up to 1.6 cm, right thalamus measuring 1.1 cm and left inferior frontal lobe with calcification measuring 6 mm. Additional lesions better visualized on prior MRI are not well visualized on this unenhanced CT.  3. No acute intracranial abnormality.  Review of Systems:   Unable to assess due to mental status  Past Medical History  She,  has a past medical history of Abdominal pain, unspecified site, Anemia, Anxiety, Aortic stenosis, Arthritis, CAD (coronary artery disease), Depression, Dyspnea, GERD (gastroesophageal reflux disease), History of cardiovascular stress test, History of echocardiogram, History of GI bleed, Hypercholesterolemia, Hyperlipidemia, Hypertension, Iron deficiency anemia, Myocardial infarction ([place]) (2010), Neuromuscular disorder ([place]), Peripheral vascular disease ([place]), and Shoulder impingement syndrome.   Surgical History    Past Surgical History:  Procedure Laterality Date  . ABDOMINAL HYSTERECTOMY    . AORTIC VALVE REPLACEMENT     [place] #[id number]  . CARDIAC CATHETERIZATION N/A 08/11/2015   Procedure: Right Heart Cath;  Surgeon: Jolaine Artist, MD;  Location: [place] [facility];  Service: Cardiovascular;  Laterality: N/A;  . CARDIAC SURGERY  2010   , 2 blockages  . CHOLECYSTECTOMY    . COLONOSCOPY N/A 04/12/2017   Procedure: COLONOSCOPY;  Surgeon: Manus Gunning, MD;  Location: [facility] ENDOSCOPY;  Service: Gastroenterology;  Laterality: N/A;  . CORONARY ARTERY BYPASS GRAFT    . ENTEROSCOPY N/A 10/01/2017   Procedure: ENTEROSCOPY;  Surgeon: Irene Shipper, MD;  Location: [facility] ENDOSCOPY;  Service: Endoscopy;  Laterality: N/A;  . ESOPHAGOGASTRODUODENOSCOPY N/A 04/12/2017   Procedure: ESOPHAGOGASTRODUODENOSCOPY (EGD);  Surgeon: Manus Gunning, MD;  Location: [place];  Service: Gastroenterology;  Laterality: N/A;  . GIVENS CAPSULE STUDY N/A 02/23/2018   Procedure: GIVENS CAPSULE STUDY;  Surgeon: Milus Banister, MD;  Location: [facility] ENDOSCOPY;  Service: Endoscopy;  Laterality: N/A;  . HEEL SPUR EXCISION  2000   left heel  . SPINE SURGERY  2003   Herniated diskectomy  . TOTAL HIP ARTHROPLASTY Right 08/22/2017   Procedure: RIGHT TOTAL HIP ARTHROPLASTY ANTERIOR APPROACH;  Surgeon: Mcarthur Rossetti, MD;  Location: [facility] ORS;  Service: Orthopedics;  Laterality: Right;  . WRIST SURGERY     right wrist     Social History   reports that she has been smoking cigarettes. She has a 52.00 pack-year smoking history. She has never used smokeless tobacco. She reports that she does not drink alcohol or use drugs.   Family History   Her family history includes Diabetes in her brother; Hypertension in her brother, daughter, father, mother, and sister; Kidney disease in her mother. There is no history of Heart attack or Stroke.   Allergies Allergies  Allergen Reactions  . Codeine Other (See Comments)    Feeling intoxicated  . Lorazepam Other (See Comments)    Talk out of head, joggy     Home Medications  Prior to Admission medications   Medication Sig Start Date End Date Taking? Authorizing Provider  acetaminophen (TYLENOL) 325 MG tablet Take 2 tablets (650 mg total) by mouth every 6 (six) hours as needed for mild pain (or Fever >/= 101). 04/12/17  Yes Rosita Fire, MD  albuterol (PROVENTIL) (2.5 MG/3ML) 0.083% nebulizer solution Take 3  mLs (2.5 mg total) by nebulization every 6 (six) hours as needed for wheezing or shortness of breath. 07/27/18  Yes Juanito Doom, MD  amLODipine (NORVASC) 10 MG tablet Take 10 mg by mouth daily.   Yes [provider]  atorvastatin (LIPITOR) 80 MG tablet TAKE 1 TABLET BY MOUTH EVERY DAY Patient taking differently: Take 80 mg by mouth daily.  08/06/17  Yes Jerline Pain, MD  BYSTOLIC 5 MG tablet TAKE 1 TABLET BY MOUTH EVERY DAY Patient taking differently:  Take 5 mg by mouth daily.  03/31/18  Yes Jerline Pain, MD  Calcium Carbonate-Vitamin D (CALCIUM 600+D) 600-400 MG-UNIT per tablet Take 1 tablet by mouth daily.   Yes [provider]  chlorpheniramine-HYDROcodone (TUSSIONEX PENNKINETIC ER) 10-8 MG/5ML SUER Take 5 mLs by mouth every 12 (twelve) hours as needed for cough. 07/27/18  Yes Juanito Doom, MD  dexamethasone (DECADRON) 4 MG tablet Take 1 tablet (4 mg total) by mouth 2 (two) times daily. Patient taking differently: Take 4 mg by mouth 3 (three) times daily.  07/31/18  Yes Curt Bears, MD  ferrous sulfate 325 (65 FE) MG tablet Take 1 tablet (325 mg total) by mouth 2 (two) times daily with a meal. 10/03/17  Yes Rosita Fire, MD  omeprazole (PRILOSEC) 40 MG capsule Take 1 capsule (40 mg total) by mouth 2 (two) times daily. Patient taking differently: Take 40 mg by mouth daily.  04/12/17  Yes Rosita Fire, MD  potassium chloride (MICRO-K) 10 MEQ CR capsule TAKE 2 CAPSULES BY MOUTH TWICE A DAY Patient taking differently: Take 20 mEq by mouth 2 (two) times daily.  09/15/17  Yes Jerline Pain, MD  prochlorperazine (COMPAZINE) 10 MG tablet Take 1 tablet (10 mg total) by mouth every 6 (six) hours as needed for nausea or vomiting. 07/31/18  Yes Curt Bears, MD  tiotropium (SPIRIVA HANDIHALER) 18 MCG inhalation capsule Place 1 capsule (18 mcg total) into inhaler and inhale every morning. 07/27/18  Yes Juanito Doom, MD     Critical care time: 20

## 2018-08-23 NOTE — Progress Notes (Addendum)
CRITICAL VALUE ALERT  Critical Value:  Lactic Acid 3.5, Troponin 0.81  Date & Time Notied:  08/15/2018, 2205  Provider Notified: Lamar Blinks NP  Orders Received/Actions taken: Awaiting orders

## 2018-08-23 NOTE — Progress Notes (Signed)
Per Doctor Julien Nordmann patient to receive Udenyca injection today.

## 2018-08-23 NOTE — Progress Notes (Signed)
This provider was asked to see [name] as she presented for her Udenyca injection in the flush room.  At that time the patient was noted to be warm to the touch with rigors changes in mental status.  The patient was noted to have a PICC line in her right medial upper extremity.  The cap from the PICC line was absent and was not present when the patient was first seen according to Kasandra Knudsen, LPN.  There is no erythema or exudate noted at that site.  Dr. Julien Nordmann was informed of the situation and the patient was immediately transported to the emergency room for management.  Report was given to the charge nurse who immediately assumed care of the patient on her arrival to the emergency room.  Sandi Mealy, MHS, PA-C Physician Assistant

## 2018-08-23 NOTE — Progress Notes (Signed)
Pt returned today for Udencya injection. Upon arrival Pt was shaking and slurring speech. Pt vial signs are BP 134/105, pulse 95, resp 26, temp 100 o2 84. Symptoms management PA Sandi Mealy came and suggested pt be seen in the ED dept. O2 was put on pt and transported. Pt recently had PICC line placed at [facility] main campus, PICC line is uncapped and clamp is under dressing. Need further investigation.

## 2018-08-23 NOTE — ED Notes (Signed)
Made Ed physician aware of patient slow bp. Told to page admitting provider.

## 2018-08-23 NOTE — ED Notes (Signed)
Admitting provider has been paged by Network engineer.

## 2018-08-23 NOTE — ED Notes (Signed)
Patient transported to CT 

## 2018-08-23 NOTE — ED Provider Notes (Signed)
[place] DEPT Provider Note   CSN: [id number] Arrival date & time: 2018-09-02  1211   LEVEL 5 CAVEAT - ALTERED MENTAL STATUS   History   Chief Complaint Chief Complaint  Patient presents with  . Fever    HPI [name] is a 77 y.o. female.  HPI  77 year old female brought in from the cancer center for fever and confusion.  She was going there today to get an infusion.  The patient states she does not know why she is here.  She complains of multiple chronic issues including chronic dyspnea and chronic leg pain.  However the family arrived shortly after the patient and states that she has been confused since waking up this morning.  Had may be a little bit of confusion a couple days ago but nothing like this.  She has a chronic cough but family has not noticed any worsening.  No other new symptoms.  She had a PICC line placed within the last 1 week.  Past Medical History:  Diagnosis Date  . Abdominal pain, unspecified site   . Anemia    Iron deficiency anemia  . Anxiety   . Aortic stenosis   . Arthritis   . CAD (coronary artery disease)    psot bypass x2 to diagonal and obtuse marginal. SVG graft-01/08/2009  . Depression   . Dyspnea   . GERD (gastroesophageal reflux disease)   . History of cardiovascular stress test    Myoview 7/16:  Low risk, no ischemia or scar, EF 72%  . History of echocardiogram    Echo 7/16:  EF 55-60%, no RWMA, Gr 2 DD, AVR ok (mean 20 mmHg), PASP 40 mmHg  . History of GI bleed    With AVM  . Hypercholesterolemia   . Hyperlipidemia   . Hypertension   . Iron deficiency anemia   . Myocardial infarction ([place]) 2010  . Neuromuscular disorder (HCC)    numbness in hands  . Peripheral vascular disease ([place])    righ tleg  . Shoulder impingement syndrome    Right shoulder    Patient Active Problem List   Diagnosis Date Noted  . 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 Goals of  care, counseling/discussion 07/31/2018  . Encounter for antineoplastic chemotherapy 07/31/2018  . Encounter for antineoplastic immunotherapy 07/31/2018  . History of total hip replacement, right 06/22/2018  . Acute blood loss anemia   . Pain in the abdomen   . Angiodysplasia of duodenum   . Angiodysplasia of small intestine, except duodenum with bleeding   . Hypotension due to hypovolemia   . UGI bleed   . Hemorrhagic shock ([place]) 09/30/2017  . 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 Symptomatic anemia 04/10/2017  . Generalized weakness   . Heme positive stool   . 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 Epigastric burning sensation 04/19/2015  . Chronic diarrhea 04/19/2015  . History of GI bleed 03/14/2015  . Long-term use of high-risk medication 01/11/2015  . Dysphagia 01/11/2015  . S/P AVR 01/11/2015  . Dyspnea 01/11/2015  . Tobacco abuse 01/11/2015  . Numbness of anterior thigh-Left 12/09/2014  . Pain in joint, lower leg 12/09/2014  . Essential hypertension 07/14/2014  . Coronary artery disease involving coronary bypass graft of native heart without angina pectoris 07/14/2014  . Dilated aortic root ([place]) 07/14/2014  . H/O aortic valve replacement 07/14/2014  . Tobacco use 07/14/2014  . Obesity  07/14/2014  . Palpitations 01/03/2014  . Aortic valve disorder 06/19/2013  . Heart valve replaced by other means 06/19/2013  . Flatulence, eructation, and gas pain 06/19/2013  . Pure hypercholesterolemia 06/19/2013  . 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 Hypopotassemia 06/19/2013  . Anemia, unspecified 06/19/2013  . Anxiety state, unspecified 06/19/2013  . Esophageal reflux 06/19/2013  . Personal history of other diseases of digestive system 06/19/2013  . Iron deficiency anemia, unspecified 06/19/2013  . Proteinuria 06/19/2013  . Arthropathy, unspecified, site unspecified 06/19/2013  . Major depressive disorder, single episode,  unspecified 06/19/2013  . 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 Tobacco use disorder 06/19/2013  . Peripheral vascular disease ([place]) 10/14/2012  . Atherosclerosis of aorta ([place]) 10/14/2012  . Pain in limb 10/14/2012  . Atherosclerosis of abdominal aorta ([place]) 10/16/2011  . PVD (peripheral vascular disease) ([place]) 10/16/2011    Past Surgical History:  Procedure Laterality Date  . ABDOMINAL HYSTERECTOMY    . AORTIC VALVE REPLACEMENT     [facility] #[id number]  . CARDIAC CATHETERIZATION N/A 08/11/2015   Procedure: Right Heart Cath;  Surgeon: Jolaine Artist, MD;  Location: [place] [facility];  Service: Cardiovascular;  Laterality: N/A;  . CARDIAC SURGERY  2010   , 2 blockages  . CHOLECYSTECTOMY    . COLONOSCOPY N/A 04/12/2017   Procedure: COLONOSCOPY;  Surgeon: Manus Gunning, MD;  Location: [facility] ENDOSCOPY;  Service: Gastroenterology;  Laterality: N/A;  . CORONARY ARTERY BYPASS GRAFT    . ENTEROSCOPY N/A 10/01/2017   Procedure: ENTEROSCOPY;  Surgeon: Irene Shipper, MD;  Location: [facility] ENDOSCOPY;  Service: Endoscopy;  Laterality: N/A;  . ESOPHAGOGASTRODUODENOSCOPY N/A 04/12/2017   Procedure: ESOPHAGOGASTRODUODENOSCOPY (EGD);  Surgeon: Manus Gunning, MD;  Location: [place];  Service: Gastroenterology;  Laterality: N/A;  . GIVENS CAPSULE STUDY N/A 02/23/2018   Procedure: GIVENS CAPSULE STUDY;  Surgeon: Milus Banister, MD;  Location: [facility] ENDOSCOPY;  Service: Endoscopy;  Laterality: N/A;  . HEEL SPUR EXCISION  2000   left heel  . SPINE SURGERY  2003   Herniated diskectomy  . TOTAL HIP ARTHROPLASTY Right 08/22/2017   Procedure: RIGHT TOTAL HIP ARTHROPLASTY ANTERIOR APPROACH;  Surgeon: Mcarthur Rossetti, MD;  Location: [facility] ORS;  Service: Orthopedics;  Laterality: Right;  . WRIST SURGERY     right wrist     OB History   None      Home Medications    Prior to Admission medications   Medication Sig Start Date End Date Taking? Authorizing Provider  acetaminophen (TYLENOL) 325  MG tablet Take 2 tablets (650 mg total) by mouth every 6 (six) hours as needed for mild pain (or Fever >/= 101). 04/12/17  Yes Rosita Fire, MD  albuterol (PROVENTIL) (2.5 MG/3ML) 0.083% nebulizer solution Take 3 mLs (2.5 mg total) by nebulization every 6 (six) hours as needed for wheezing or shortness of breath. 07/27/18  Yes Juanito Doom, MD  amLODipine (NORVASC) 10 MG tablet Take 10 mg by mouth daily.   Yes [provider]  atorvastatin (LIPITOR) 80 MG tablet TAKE 1 TABLET BY MOUTH EVERY DAY Patient taking differently: Take 80 mg by mouth daily.  08/06/17  Yes Skains, Thana Farr, MD  BYSTOLIC 5 MG tablet TAKE 1 TABLET BY MOUTH EVERY DAY Patient taking differently: Take 5 mg by mouth daily.  03/31/18  Yes Jerline Pain, MD  Calcium Carbonate-Vitamin D (CALCIUM 600+D) 600-400 MG-UNIT per tablet Take 1 tablet by mouth daily.   Yes [provider]  chlorpheniramine-HYDROcodone (TUSSIONEX PENNKINETIC ER) 10-8 MG/5ML SUER Take 5 mLs by mouth every 12 (twelve) hours as needed for cough. 07/27/18  Yes Juanito Doom, MD  dexamethasone (DECADRON) 4 MG tablet Take 1 tablet (4 mg total) by mouth 2 (two) times daily. Patient taking differently: Take 4 mg by mouth 3 (three) times daily.  07/31/18  Yes Curt Bears, MD  ferrous sulfate 325 (65 FE) MG tablet Take 1 tablet (325 mg total) by mouth 2 (two) times daily with a meal. 10/03/17  Yes Rosita Fire, MD  omeprazole (PRILOSEC) 40 MG capsule Take 1 capsule (40 mg total) by mouth 2 (two) times daily. Patient taking differently: Take 40 mg by mouth daily.  04/12/17  Yes Rosita Fire, MD  potassium chloride (MICRO-K) 10 MEQ CR capsule TAKE 2 CAPSULES BY MOUTH TWICE A DAY Patient taking differently: Take 20 mEq by mouth 2 (two) times daily.  09/15/17  Yes Jerline Pain, MD  prochlorperazine (COMPAZINE) 10 MG tablet Take 1 tablet (10 mg total) by mouth every 6 (six) hours as needed for nausea or vomiting. 07/31/18   Yes Curt Bears, MD  tiotropium (SPIRIVA HANDIHALER) 18 MCG inhalation capsule Place 1 capsule (18 mcg total) into inhaler and inhale every morning. 07/27/18  Yes Juanito Doom, MD    Family History Family History  Problem Relation Age of Onset  . Hypertension Mother   . Kidney disease Mother   . Hypertension Father   . Hypertension Sister   . Hypertension Brother   . Hypertension Daughter   . Diabetes Brother   . Heart attack Neg Hx   . Stroke Neg Hx     Social History Social History   Tobacco Use  . Smoking status: Current Every Day Smoker    Packs/day: 1.00    Years: 52.00    Pack years: 52.00    Types: Cigarettes  . Smokeless tobacco: Never Used  . Tobacco comment: smokes 1/2 pack per day  Substance Use Topics  . Alcohol use: No    Alcohol/week: 0.0 standard drinks  . Drug use: No     Allergies   Codeine and Lorazepam   Review of Systems Review of Systems  Unable to perform ROS: Mental status change     Physical Exam Updated Vital Signs BP (!) 92/59 (BP Location: Right Arm)   Pulse 99   Temp 98.7 F (37.1 C) (Oral)   Resp (!) 24   Wt 61.2 kg Comment: recorded 3 days ago  LMP  (LMP Unknown)   SpO2 92%   BMI 41.20 kg/m   Physical Exam  Constitutional: She appears well-developed and well-nourished. No distress.  HENT:  Head: Normocephalic and atraumatic.  Right Ear: External ear normal.  Left Ear: External ear normal.  Nose: Nose normal.  Eyes: Right eye exhibits no discharge. Left eye exhibits no discharge.  Cardiovascular: Normal rate, regular rhythm and normal heart sounds.  Pulmonary/Chest: Effort normal. No accessory muscle usage. Tachypnea noted. She has wheezes. She has rhonchi.  Abdominal: Soft. There is no tenderness.  Musculoskeletal:       Right elbow: No tenderness found.       Cervical back: She exhibits no tenderness.       Thoracic back: She exhibits no tenderness.       Lumbar back: She exhibits no tenderness.        Arms: Neurological: She is alert.  Skin: Skin is warm and dry. She is not diaphoretic.  Psychiatric: Her  mood appears not anxious.  Nursing note and vitals reviewed.    ED Treatments / Results  Labs (all labs ordered are listed, but only abnormal results are displayed) Labs Reviewed  COMPREHENSIVE METABOLIC PANEL - Abnormal; Notable for the following components:      Result Value   Potassium 5.3 (*)    Chloride 97 (*)    Glucose, Bld 160 (*)    Calcium 8.8 (*)    AST 45 (*)    All other components within normal limits  CBC WITH DIFFERENTIAL/PLATELET - Abnormal; Notable for the following components:   WBC 20.5 (*)    RBC 3.52 (*)    Hemoglobin 10.4 (*)    HCT 34.4 (*)    Platelets 64 (*)    Neutro Abs 18.1 (*)    Lymphs Abs 0.0 (*)    Abs Immature Granulocytes 2.04 (*)    All other components within normal limits  PROTIME-INR - Abnormal; Notable for the following components:   Prothrombin Time 17.5 (*)    All other components within normal limits  URINALYSIS, ROUTINE W REFLEX MICROSCOPIC - Abnormal; Notable for the following components:   APPearance HAZY (*)    Hgb urine dipstick MODERATE (*)    Protein, ur >=300 (*)    Leukocytes, UA TRACE (*)    Bacteria, UA FEW (*)    All other components within normal limits  I-STAT CG4 LACTIC ACID, ED - Abnormal; Notable for the following components:   Lactic Acid, Venous 4.04 (*)    All other components within normal limits  I-STAT CG4 LACTIC ACID, ED - Abnormal; Notable for the following components:   Lactic Acid, Venous 5.61 (*)    All other components within normal limits  CULTURE, BLOOD (ROUTINE X 2)  CULTURE, BLOOD (ROUTINE X 2)  URINE CULTURE    EKG EKG Interpretation  Date/Time:  2018/08/18 13:42:47 EST Ventricular Rate:  108 PR Interval:    QRS Duration: 69 QT Interval:  312 QTC Calculation: 419 R Axis:   80 Text Interpretation:  Sinus tachycardia Atrial premature complex Low voltage, extremity  and precordial leads no significant change since earlier in the day Confirmed by Sherwood Gambler [phone]) on 08/18/2018 2:20:25 PM   Radiology Dg Chest 2 View  Result Date: 18-Aug-2018 CLINICAL DATA:  Fever and altered mental status. EXAM: CHEST - 2 VIEW COMPARISON:  04/09/2017 FINDINGS: Previous median sternotomy and aortic valve replacement. Right arm PICC tip at the SVC RA junction. Small bilateral pleural effusions layering dependently. Hazy pulmonary opacity that could relate to fluid overload or hazy pneumonia. Mediastinal mass as noted previously. Chronic degenerative changes affect the spine. IMPRESSION: Small effusions. Hazy pulmonary opacity that could be fluid overload/edema versus is hazy pneumonia. No dense consolidation. Extensive mediastinal mass as shown previously. Electronically Signed   By: Nelson Chimes M.D.   On: 2018-08-18 13:05   Ct Head Wo Contrast  Result Date: 2018-08-18 CLINICAL DATA:  Shaking and slurring of speech. Altered level of consciousness. EXAM: CT HEAD WITHOUT CONTRAST TECHNIQUE: Contiguous axial images were obtained from the base of the skull through the vertex without intravenous contrast. COMPARISON:  MRI of the brain 08/01/2018 FINDINGS: Brain: Redemonstration of cerebellar and supratentorial intra-axial necrotic masses some which with peripheral calcifications. Within the cerebellum there are at least 2 calcified intra-axial masses estimated at 8 and 9 mm on this unenhanced study. These appear larger on contrast-enhanced MRI some which is likely due to differences in technique. The largest  masses in the left inferior cerebellar hemisphere measuring approximately 2.3 cm on CT with adjacent vasogenic edema and stable positive slight mass effect on the fourth ventricle without hydrocephalus. Redemonstration of intra-axial masses in the right frontal white matter measuring to 1.6 cm, right thalamus measuring up to 1.1 cm, left inferior frontal lobe with calcification  measuring 6 mm. Adjacent left temporal lobe lesions are not well visualized on this unenhanced study. No acute intracranial hemorrhage. No hydrocephalus or midline shift. No acute large vascular territory infarct. Vascular: No hyperdense vessel signs. Marked atherosclerosis of the carotid siphons bilaterally. Skull: No aggressive osseous lesions. Sinuses/Orbits: Intact orbits. Mucosal opacification of the included maxillary sinuses bilaterally with mild-to-moderate ethmoid sinus mucosal thickening. The sphenoid sinuses clear. The frontal sinus is not pneumatized. Other: No suspicious calvarial abnormality. IMPRESSION: 1. 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 Vasogenic edema adjacent to the left inferior cerebellar mass causing slight positive mass effect on the left lateral aspect of fourth ventricle without causing hydrocephalus. The largest intra-axial mass is in the left inferior cerebellar hemisphere measuring 2.3 cm. 2. Redemonstration of intra-axial masses in the right frontal white matter measuring up to 1.6 cm, right thalamus measuring 1.1 cm and left inferior frontal lobe with calcification measuring 6 mm. Additional lesions better visualized on prior MRI are not well visualized on this unenhanced CT. 3. No acute intracranial abnormality. Electronically Signed   By: Ashley Royalty M.D.   On: 08/12/2018 15:55    Procedures .Critical Care Performed by: Sherwood Gambler, MD Authorized by: Sherwood Gambler, MD   Critical care provider statement:    Critical care time (minutes):  35   Critical care time was exclusive of:  Separately billable procedures and treating other patients   Critical care was necessary to treat or prevent imminent or life-threatening deterioration of the following conditions:  Sepsis and respiratory failure   Critical care was time spent personally by me on the following activities:  Development of treatment plan with patient or surrogate,  discussions with consultants, evaluation of patient's response to treatment, examination of patient, obtaining history from patient or surrogate, ordering and performing treatments and interventions, ordering and review of laboratory studies, ordering and review of radiographic studies, pulse oximetry, re-evaluation of patient's condition and review of old charts   (including critical care time)  Medications Ordered in ED Medications  ceFEPIme (MAXIPIME) 2 g in sodium chloride 0.9 % 100 mL IVPB (0 g Intravenous Stopped 08-12-2018 1411)  sodium chloride 0.9 % bolus 1,000 mL (0 mLs Intravenous Stopped 08/12/18 1524)    And  sodium chloride 0.9 % bolus 1,000 mL (1,000 mLs Intravenous New Bag/Given 08-12-2018 1420)  acetaminophen (TYLENOL) tablet 650 mg (650 mg Oral Given August 12, 2018 1326)  vancomycin (VANCOCIN) 1,250 mg in sodium chloride 0.9 % 250 mL IVPB (0 mg Intravenous Stopped 08/12/2018 1604)  ipratropium-albuterol (DUONEB) 0.5-2.5 (3) MG/3ML nebulizer solution 3 mL (3 mLs Nebulization Given 2018-08-12 1334)  ipratropium-albuterol (DUONEB) 0.5-2.5 (3) MG/3ML nebulizer solution 3 mL (3 mLs Nebulization Given 08-12-18 1420)     Initial Impression / Assessment and Plan / ED Course  I have reviewed the triage vital signs and the nursing notes.  Pertinent labs & imaging results that were available during my care of the patient were reviewed by me and considered in my medical decision making (see chart for details).     Patient presents with septic shock.  She was given Tylenol, IV fluids per sepsis algorithm, and broad antibiotics.  I am concerned  about her PICC line site.  When the IV team was here, we removed the dressing and there is not only redness but also some drainage at the insertion site.  Given other IV access, this pack was removed.  She also has hypoxia that is new, while this could be from sepsis, with her cough pneumonia is certainly a possibility.  She is protecting her airway but is still  confused.  CT head obtained shows necrotic masses but no change from last week.  She will need treatment and admission to the hospital.  Discussed with Dr. [place] Pink who will admit.  Final Clinical Impressions(s) / ED Diagnoses   Final diagnoses:  Septic shock [facility])    ED Discharge Orders    None       Sherwood Gambler, MD 08-19-2018 [phone]

## 2018-08-23 NOTE — Procedures (Signed)
Central line placement.  Consent obtained from daughter.  R femoral line placement (lack of other sites, agitation).    Line site prepped and draped in sterile fashion.  Used Gown, gloves, hat, mask, sterile drape, chlorhexidine prep.   Local lidocaine.    US guidance.  Vein accessed in 1 attempt.  All ports draw back and flushed.   No complications.  Patient tolerated procedure well.  Able to use line right away (due to site in groin).

## 2018-08-23 NOTE — Progress Notes (Signed)
NP K. Schorr paged about decrease in BP (89/57, map- 66). Orders for a 500 cc bolus. BP increased (114/48 map-67) with the bolus. BP to be taken q40min. Will continue to monitor pt closely.

## 2018-08-23 NOTE — ED Notes (Signed)
Pt dtr is at bedside. Pt appears restless and has expiratory wheezes, pt breathing is shallow and labored. Pt given Neb treatment. Pt PICC has some redness noted around the area, no cap was in place, and dressing is old and soiled with blood. IV team has been consulted.

## 2018-08-23 NOTE — Progress Notes (Signed)
Pharmacy Antibiotic Note  [name] is a 77 y.o. female admitted on Aug 17, 2018 with sepsis.  Pharmacy has been consulted for Vancomycin & Cefepime, Metronidazole per MD dosing.  Plan: Cefepime 2gm x1 then 1gm q12 Vancomycin 1250mg  x1, then 500mg  q24 Flagyl 500mg  IV q8  Weight: 135 lb (61.2 kg)(recorded 3 days ago)  Temp (24hrs), Avg:99.7 F (37.6 C), Min:98.7 F (37.1 C), Max:100.4 F (38 C)  Recent Labs  Lab 08/04/18 1016 08/07/18 1317 08/17/2018 1237 08/17/18 1244 08/17/2018 1602  WBC 9.1 10.6* 20.5*  --   --   CREATININE 0.66 0.50 0.69  --   --   LATICACIDVEN  --   --   --  4.04* 5.61*    Estimated Creatinine Clearance: 32.7 mL/min (by C-G formula based on SCr of 0.69 mg/dL).    Allergies  Allergen Reactions  . Codeine Other (See Comments)    Feeling intoxicated  . Lorazepam Other (See Comments)    Talk out of head, joggy   Antimicrobials this admission: 11/18 Cefepime >>  11/18 Vancomycin >>  11/18 Flagyl >>  Dose adjustments this admission:  Microbiology results: 11/18 BCx: sent 11/18 UCx: sent  11/18 MRSA PCR: sent  Thank you for allowing pharmacy to be a part of this patient's care.  Minda Ditto PharmD Pager [phone] 08/17/18, 6:44 PM

## 2018-08-23 NOTE — ED Notes (Signed)
Ed provider Regenia Skeeter notified patient has a critical lactic acid value of 4.04

## 2018-08-23 NOTE — Discharge Summary (Signed)
Death Summary  [name] VWU:[id number] DOB: March 08, 1941 DOA: September 09, 2018  PCP: Donald Prose, MD  Admit date: 09/09/2018 Date of Death: September 09, 2018 Time of Death: 22:57 Notification: Donald Prose, MD notified of death of 2018-09-10   History of present illness:  [name] is a 77 y.o. 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  Patient did okay over the weekend, but then this morning, patient's daughter found her to be very confused, moaning as if in pain and patient was brought into the emergency room.  ED Course: In the emergency room, patient was found to low-grade temperatures as high as 100.4, tachycardic and hypotensive.  Lab work noteworthy for white count of 20.5 and a lactic acid level of 4.04.  Patient felt to be in septic shock.  Started on IV fluids and blood culture sent.  Urinalysis and chest x-ray unimpressive.  She also was noted to be hypoxic requiring 2 L nasal cannula.  Emergency room evaluated her PICC line and found redness as well as drainage at the insertion site.  Peripheral IV was placed and PICC line was removed with culture tip sent.  Hospitalist were called for further evaluation.  4 hours after initial lactic acid level, patient still hypotensive with a systolic in the 29F and lactic acid level now at 5.4.  Blood pressure has since stabilized to a systolic of 621.  Patient admitted to the ICU.  Patient also seen by critical care.  Lactic acid level trended downward to 3.5.  Troponin at 0.81.  Unfortunately, she continued to decline despite maximum attempts.  As per family wishes, patient was kept DNR and she passed away in the ICU on  09/10/23 at 22:57pm  Final Diagnoses:  Septic shock from PICC line infection versus pneumonia causing acute encephalopathy Acute respiratory failure with hypoxia Demand ischemia secondary to sepsis Small cell lung carcinoma with necrotic metastases to the brain Essential hypertension   The results of significant diagnostics from this hospitalization (including imaging, microbiology, ancillary and laboratory) are listed below for reference.    Significant Diagnostic Studies: Dg Chest 2 View  Result Date: Sep 09, 2018 CLINICAL DATA:  Fever and altered mental status. EXAM: CHEST - 2 VIEW COMPARISON:  04/09/2017 FINDINGS: Previous median sternotomy and aortic valve replacement. Right arm PICC tip at the SVC RA junction. Small bilateral pleural effusions layering dependently. Hazy pulmonary opacity that could relate to fluid overload or hazy pneumonia. Mediastinal mass as noted previously. Chronic degenerative changes affect the spine. IMPRESSION: Small effusions. Hazy pulmonary opacity that could be fluid overload/edema versus is hazy pneumonia. No dense consolidation. Extensive mediastinal mass as shown previously. Electronically Signed   By: Nelson Chimes M.D.   On: 09-09-18 13:05   Ct Head Wo Contrast  Result Date: 09-09-2018 CLINICAL DATA:  Shaking and slurring of speech. Altered level of consciousness. EXAM: CT HEAD WITHOUT CONTRAST TECHNIQUE: Contiguous axial images were obtained from the base of the skull through the vertex without intravenous contrast. COMPARISON:  MRI of the brain 08/01/2018 FINDINGS: Brain: Redemonstration of cerebellar and supratentorial intra-axial necrotic masses some which with peripheral calcifications. Within the cerebellum there are at least 2 calcified intra-axial masses estimated at 8  and 9 mm on this unenhanced study. These appear larger on contrast-enhanced MRI some which is likely due to differences in technique. The largest masses in the left inferior cerebellar  hemisphere measuring approximately 2.3 cm on CT with adjacent vasogenic edema and stable positive slight mass effect on the fourth ventricle without hydrocephalus. Redemonstration of intra-axial masses in the right frontal white matter measuring to 1.6 cm, right thalamus measuring up to 1.1 cm, left inferior frontal lobe with calcification measuring 6 mm. Adjacent left temporal lobe lesions are not well visualized on this unenhanced study. No acute intracranial hemorrhage. No hydrocephalus or midline shift. No acute large vascular territory infarct. Vascular: No hyperdense vessel signs. Marked atherosclerosis of the carotid siphons bilaterally. Skull: No aggressive osseous lesions. Sinuses/Orbits: Intact orbits. Mucosal opacification of the included maxillary sinuses bilaterally with mild-to-moderate ethmoid sinus mucosal thickening. The sphenoid sinuses clear. The frontal sinus is not pneumatized. Other: No suspicious calvarial abnormality. IMPRESSION: 1. 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 Vasogenic edema adjacent to the left inferior cerebellar mass causing slight positive mass effect on the left lateral aspect of fourth ventricle without causing hydrocephalus. The largest intra-axial mass is in the left inferior cerebellar hemisphere measuring 2.3 cm. 2. Redemonstration of intra-axial masses in the right frontal white matter measuring up to 1.6 cm, right thalamus measuring 1.1 cm and left inferior frontal lobe with calcification measuring 6 mm. Additional lesions better visualized on prior MRI are not well visualized on this unenhanced CT. 3. No acute intracranial abnormality. Electronically Signed   By: Ashley Royalty M.D.   On: 08-25-18 15:55   Mr Jeri Cos EX Contrast  Result Date: 08/01/2018 CLINICAL DATA:  New diagnosis small cell lung cancer.  Staging. EXAM: MRI HEAD WITHOUT AND WITH CONTRAST TECHNIQUE: Multiplanar, multiecho pulse sequences of the brain and  surrounding structures were obtained without and with intravenous contrast. CONTRAST:  6 cc Gadavist COMPARISON:  None. FINDINGS: Brain: The study suffers from considerable motion degradation. Contrast administration failed. There is no evidence of circulating contrast despite description of injection. Cerebellum: 3 cerebellar metastases with necrosis. Left cerebellar metastasis measuring 3.5 cm in diameter. Right cerebellar metastasis measuring 2.2 cm in diameter. More inferior right cerebellar metastasis measuring 1.5 cm in diameter. Mass-effect upon the fourth ventricle but no evidence of ventricular obstruction. Right cerebral hemisphere: 2 cm necrotic right thalamic metastasis. 2.1 cm necrotic right frontal metastasis. Moderate vasogenic edema in the region of the frontal metastasis. Left cerebral hemisphere: 2 adjacent posterior temporal metastases, each about 1 cm in size. Possible punctate metastasis in the deep white matter adjacent to the posterior body of the left lateral ventricle. Left frontal necrotic metastasis measuring 13 mm in size. Vascular: Major vessels at the base of the brain show flow. Skull and upper cervical spine: Negative Sinuses/Orbits: Mucosal inflammatory changes affecting all of the paranasal sinuses. Orbits negative. Other: None IMPRESSION: The examination suffers from severe motion degradation. Additionally, despite supposed contrast administration, no contrast is visible on this study. Depending upon treatment choices, 1 might want to repeat this study if the patient can be in better condition and better venous access can be achieved. Multiple necrotic metastases as outlined above. Three cerebellar metastases, the largest on the left measuring 3.5 cm in diameter. Mass-effect upon the fourth ventricle but without evidence of obstructive hydrocephalus. 2 cm necrotic right thalamic metastasis. Numerous bilateral necrotic cerebral hemispheric metastases as described above. No evidence  of midline shift or herniation. Electronically Signed   By: Elta Guadeloupe  Shogry M.D.   On: 08/01/2018 12:54   Ct Biopsy  Result Date: 07/20/2018 INDICATION: 77 year old female with an infiltrative mediastinal mass encasing the sternum and extending into the superficial soft tissues of the anterior chest wall. She presents for CT-guided biopsy of the same to establish tissue diagnosis. EXAM: CT-guided biopsy, chest wall mass MEDICATIONS: None. ANESTHESIA/SEDATION: Moderate (conscious) sedation was employed during this procedure. A total of Versed 1.5 mg and Fentanyl 50 mcg was administered intravenously. Moderate Sedation Time: 10 minutes. The patient's level of consciousness and vital signs were monitored continuously by radiology nursing throughout the procedure under my direct supervision. FLUOROSCOPY TIME:  Fluoroscopy Time: 0 minutes 0 seconds (0 mGy). COMPLICATIONS: None immediate. PROCEDURE: Informed written consent was obtained from the patient after a thorough discussion of the procedural risks, benefits and alternatives. All questions were addressed. A timeout was performed prior to the initiation of the procedure. A planning axial CT scan was performed. The infiltrative soft tissue mass surrounding the sternum was successfully identified. A suitable skin entry site was selected and marked. The overlying skin was prepped and draped in the standard sterile fashion using chlorhexidine skin prep. Local anesthesia was attained by infiltration with 1% lidocaine. A small dermatotomy was made. Using intermittent CT guidance, a 17 gauge introducer needle was carefully advanced into the margin of the superficial component of the mass. Multiple 18 gauge core biopsies were then coaxially obtained using the bio Pince automated biopsy device. Biopsy specimens were placed in both formalin and saline and delivered to pathology for further analysis. The saline is to insure that flow cytometry can be performed if necessary.  Following biopsy, the introducer needle was removed. Follow-up CT imaging demonstrates no evidence of immediate complication. IMPRESSION: Successful CT-guided biopsy of anterior chest wall mass. Electronically Signed   By: Jacqulynn Cadet M.D.   On: 07/20/2018 10:16   Vas [place] Upper Extremity Venous Duplex  Result Date: 07/31/2018 UPPER VENOUS STUDY  Indications: Swelling Performing Technologist: Oliver Hum RVT  Examination Guidelines: A complete evaluation includes B-mode imaging, spectral Doppler, color Doppler, and power Doppler as needed of all accessible portions of each vessel. Bilateral testing is considered an integral part of a complete examination. Limited examinations for reoccurring indications may be performed as noted.  Right Findings: +----------+------------+----------+---------+-----------+-------+ RIGHT     CompressiblePropertiesPhasicitySpontaneousSummary +----------+------------+----------+---------+-----------+-------+ Subclavian    Full                 Yes       Yes            +----------+------------+----------+---------+-----------+-------+  Left Findings: +----------+------------+----------+---------+-----------+-------+ LEFT      CompressiblePropertiesPhasicitySpontaneousSummary +----------+------------+----------+---------+-----------+-------+ IJV           Full                 Yes       Yes            +----------+------------+----------+---------+-----------+-------+ Subclavian    Full                 Yes       Yes            +----------+------------+----------+---------+-----------+-------+ Axillary      Full                 Yes       Yes            +----------+------------+----------+---------+-----------+-------+ Brachial      Full  Yes       Yes            +----------+------------+----------+---------+-----------+-------+ Radial        Full                                           +----------+------------+----------+---------+-----------+-------+ Ulnar         Full                                          +----------+------------+----------+---------+-----------+-------+ Cephalic      Full                                          +----------+------------+----------+---------+-----------+-------+ Basilic       Full                                          +----------+------------+----------+---------+-----------+-------+  Summary:  Right: No evidence of thrombosis in the subclavian.  Left: No evidence of deep vein thrombosis in the upper extremity. No evidence of superficial vein thrombosis in the upper extremity.  *See table(s) above for measurements and observations.  Diagnosing physician: Monica Martinez MD Electronically signed by Monica Martinez MD on 07/31/2018 at 5:06:06 PM.    Final    Ir Picc Placement Right >5 Yrs Inc Img Guide  Result Date: 08/07/2018 INDICATION: 77 year old with a small cell carcinoma. Patient has poor venous access and needs a central line for chemotherapy. Prior CT imaging demonstrates narrowing of the SVC and left innominate vein from tumor. In addition, the patient has already had facial and left arm swelling compatible with SVC syndrome. EXAM: RIGHT ARM PICC LINE PLACEMENT WITH ULTRASOUND AND FLUOROSCOPIC GUIDANCE MEDICATIONS: None ANESTHESIA/SEDATION: None FLUOROSCOPY TIME:  Fluoroscopy Time: 36 seconds, 23 mGy COMPLICATIONS: None immediate. PROCEDURE: Discussed Port-A-Cath versus PICC line with patient and family. Based on the vascular compromise from the mediastinal tumor and SVC syndrome symptoms, I am concerned that a central line could exacerbate the patient's symptoms and swelling. As a result, we decided to place a PICC line rather than a port at this time. If there is a complication with the central line, the PICC could be easily removed or pulled back. We could switch to a Port-A-Cath after the patient has received  treatment and evidence for a positive treatment response. The patient was advised of the possible risks and complications and agreed to undergo the procedure. The patient was then brought to the angiographic suite for the procedure. The right arm was prepped with chlorhexidine, draped in the usual sterile fashion using maximum barrier technique (cap and mask, sterile gown, sterile gloves, large sterile sheet, hand hygiene and cutaneous antiseptic). Local anesthesia was attained by infiltration with 1% lidocaine. Ultrasound demonstrated patency of the right basilic vein, and this was documented with an image. Under real-time ultrasound guidance, this vein was accessed with a 21 gauge micropuncture needle and image documentation was performed. The needle was exchanged over a guidewire for a peel-away sheath through which a 35 cm 5 [place] single lumen power injectable PICC was advanced, and positioned with  its tip at the lower SVC/right atrial junction. Central venogram was performed during placement to insure patency of the central venous system. Fluoroscopy during the procedure and fluoro spot radiograph confirms appropriate catheter position. The catheter was flushed, secured to the skin with Prolene sutures, and covered with a sterile dressing. FINDINGS: Large mediastinal mass. Venogram demonstrated patent SVC. However, there is significant narrowing of the SVC related to the mediastinal tumor. Catheter tip was placed beyond the area of narrowing at the superior cavoatrial junction. IMPRESSION: Successful placement of a right arm PICC with sonographic and fluoroscopic guidance. The catheter is ready for use. PICC line could be removed for a Port-A-Cath if there is a positive response to treatment and resolution of the patient's SVC syndrome symptoms. Electronically Signed   By: Markus Daft M.D.   On: 08/07/2018 18:08    Microbiology: Recent Results (from the past 240 hour(s))  Culture, blood (Routine x 2)      Status: None (Preliminary result)   Collection Time: August 11, 2018 12:37 PM  Result Value Ref Range Status   Specimen Description   Final    RIGHT ANTECUBITAL Performed at [facility] [address], [place], [place] [address]    Special Requests   Final    BOTTLES DRAWN AEROBIC AND ANAEROBIC Blood Culture results may not be optimal due to an excessive volume of blood received in culture bottles Performed at [facility] [address], [place], [place] [address]    Culture  Setup Time   Final    GRAM POSITIVE COCCI IN BOTH AEROBIC AND ANAEROBIC BOTTLES PATIENT DISCHARGED OR EXPIRED PATIENT DECEASED Performed at [facility], [place] [address], [place], [place] [address]    Culture PENDING  Incomplete   Report Status PENDING  Incomplete  Culture, blood (Routine x 2)     Status: None (Preliminary result)   Collection Time: August 11, 2018 12:53 PM  Result Value Ref Range Status   Specimen Description   Final    BLOOD RIGHT PICC LINE Performed at [facility], [place] [address] [place], [place] [address]    Special Requests   Final    BOTTLES DRAWN AEROBIC AND ANAEROBIC Blood Culture adequate volume Performed at [facility] [address] [place], [place] [address]    Culture  Setup Time   Final    GRAM POSITIVE COCCI IN BOTH AEROBIC AND ANAEROBIC BOTTLES Organism ID to follow PATIENT DISCHARGED OR EXPIRED PATIENT DECEASED Performed at [facility], [place] [address], [place], [place] [address]    Culture PENDING  Incomplete   Report Status PENDING  Incomplete  Blood Culture ID Panel (Reflexed)     Status: Abnormal   Collection Time: Aug 11, 2018 12:53 PM  Result Value Ref Range Status   Enterococcus species NOT DETECTED NOT DETECTED Final   Listeria monocytogenes NOT DETECTED NOT DETECTED Final   Staphylococcus species DETECTED (A) NOT DETECTED Final    Comment: PATIENT DISCHARGED OR EXPIRED PATIENT  DECEASED    Staphylococcus aureus (BCID) DETECTED (A) NOT DETECTED Final    Comment: Methicillin (oxacillin) susceptible Staphylococcus aureus (MSSA). Preferred therapy is anti staphylococcal beta lactam antibiotic (Cefazolin or Nafcillin), unless clinically contraindicated. PATIENT DISCHARGED OR EXPIRED PATIENT DECEASED    Methicillin resistance NOT DETECTED NOT DETECTED Final   Streptococcus species NOT DETECTED NOT DETECTED Final   Streptococcus agalactiae NOT DETECTED NOT DETECTED Final   Streptococcus pneumoniae NOT DETECTED NOT DETECTED Final   Streptococcus pyogenes  NOT DETECTED NOT DETECTED Final   Acinetobacter baumannii NOT DETECTED NOT DETECTED Final   Enterobacteriaceae species NOT DETECTED NOT DETECTED Final   Enterobacter cloacae complex NOT DETECTED NOT DETECTED Final   Escherichia coli NOT DETECTED NOT DETECTED Final   Klebsiella oxytoca NOT DETECTED NOT DETECTED Final   Klebsiella pneumoniae NOT DETECTED NOT DETECTED Final   Proteus species NOT DETECTED NOT DETECTED Final   Serratia marcescens NOT DETECTED NOT DETECTED Final   Haemophilus influenzae NOT DETECTED NOT DETECTED Final   Neisseria meningitidis NOT DETECTED NOT DETECTED Final   Pseudomonas aeruginosa NOT DETECTED NOT DETECTED Final   Candida albicans NOT DETECTED NOT DETECTED Final   Candida glabrata NOT DETECTED NOT DETECTED Final   Candida krusei NOT DETECTED NOT DETECTED Final   Candida parapsilosis NOT DETECTED NOT DETECTED Final   Candida tropicalis NOT DETECTED NOT DETECTED Final    Comment: Performed at [facility], [place] [address] [place], [place] [address]  MRSA PCR Screening     Status: None   Collection Time: 08/23/18  6:41 PM  Result Value Ref Range Status   MRSA by PCR NEGATIVE NEGATIVE Final    Comment:        The GeneXpert MRSA Assay (FDA approved for NASAL specimens only), is one component of a comprehensive MRSA colonization surveillance program. It is not intended to diagnose  MRSA infection nor to guide or monitor treatment for MRSA infections. Performed at [facility], [place] [address] [place], [place] [address]      Labs: Basic Metabolic Panel: Recent Labs  Lab 08/04/18 1016 08/07/18 1317 2018/08/23 1237  NA 143 139 136  K 4.0 4.6 5.3*  CL 107 103 97*  CO2 26 25 27   GLUCOSE 142* 116* 160*  BUN 8 11 14   CREATININE 0.66 0.50 0.69  CALCIUM 9.6 9.9 8.8*   Liver Function Tests: Recent Labs  Lab 08/04/18 1016 08-23-2018 1237  AST 26 45*  ALT 15 20  ALKPHOS 76 116  BILITOT 0.5 1.2  PROT 7.7 7.3  ALBUMIN 3.7 3.5   No results for input(s): LIPASE, AMYLASE in the last 168 hours. No results for input(s): AMMONIA in the last 168 hours. CBC: Recent Labs  Lab 08/04/18 1016 08/07/18 1317 08-23-2018 1237  WBC 9.1 10.6* 20.5*  NEUTROABS 7.9*  --  18.1*  HGB 10.7* 10.9* 10.4*  HCT 36.2 38.5 34.4*  MCV 98.4 98.2 97.7  PLT 210 182 64*   Cardiac Enzymes: Recent Labs  Lab 2018-08-23 2019  TROPONINI 0.81*   D-Dimer No results for input(s): DDIMER in the last 72 hours. BNP: Invalid input(s): POCBNP CBG: No results for input(s): GLUCAP in the last 168 hours. Anemia work up No results for input(s): VITAMINB12, FOLATE, FERRITIN, TIBC, IRON, RETICCTPCT in the last 72 hours. Urinalysis    Component Value Date/Time   COLORURINE YELLOW 08/23/2018 1421   APPEARANCEUR HAZY (A) 23-Aug-2018 1421   LABSPEC 1.018 08/23/2018 1421   PHURINE 6.0 08-23-2018 1421   GLUCOSEU NEGATIVE August 23, 2018 1421   HGBUR MODERATE (A) 2018-08-23 1421   BILIRUBINUR NEGATIVE 2018/08/23 1421   KETONESUR NEGATIVE Aug 23, 2018 1421   PROTEINUR >=300 (A) 08-23-2018 1421   UROBILINOGEN 0.2 12/27/2008 1056   NITRITE NEGATIVE 08-23-2018 1421   LEUKOCYTESUR TRACE (A) 08-23-2018 1421   Sepsis Labs Invalid input(s): PROCALCITONIN,  WBC,  LACTICIDVEN     SIGNED:  Annita Brod, MD  [facility] 08/11/2018, 9:55 AM Pager   If 7PM-7AM, please  contact  night-coverage [email or web address] Password [email or web address]

## 2018-08-23 NOTE — ED Notes (Signed)
ED TO INPATIENT HANDOFF REPORT  Name/Age/Gender [name] 77 y.o. female  Code Status    Code Status Orders  (From admission, onward)         Start     Ordered   2018/08/20 1756  Do not attempt resuscitation (DNR)  Continuous    Question Answer Comment  In the event of cardiac or respiratory ARREST Do not call a "code blue"   In the event of cardiac or respiratory ARREST Do not perform Intubation, CPR, defibrillation or ACLS   In the event of cardiac or respiratory ARREST Use medication by any route, position, wound care, and other measures to relive pain and suffering. May use oxygen, suction and manual treatment of airway obstruction as needed for comfort.      2018/08/20 1755        Code Status History    Date Active Date Inactive Code Status Order ID Comments User Context   02/12/2018 1451 02/13/2018 1551 Full Code [id number]  Reyne Dumas, MD ED   09/30/2017 0115 10/03/2017 1753 Full Code [id number]  Corey Harold, NP ED   08/22/2017 1751 08/25/2017 1607 Full Code [id number]  Mcarthur Rossetti, MD Inpatient   04/10/2017 1242 04/12/2017 2128 Full Code [id number]  Dionne Milo, NP ED   08/11/2015 1252 08/11/2015 1900 Full Code [id number]  Bensimhon, Shaune Pascal, MD Inpatient      Home/SNF/Other Home  Chief Complaint Propofol Sepsis  Level of Care/Admitting Diagnosis ED Disposition    ED Disposition Condition [facility] Area: [facility] [[id number]]  Level of Care: ICU [6]  Diagnosis: Septic shock [facility]) [[id number]]  Admitting Physician: Junius Argyle  Attending Physician: Junius Argyle  Estimated length of stay: past midnight tomorrow  Certification:: I certify this patient will need inpatient services for at least 2 midnights  PT Class (Do Not Modify): Inpatient [101]  PT Acc Code (Do Not Modify): Private [1]       Medical History Past Medical History:  Diagnosis Date  . Abdominal pain, unspecified  site   . Anemia    Iron deficiency anemia  . Anxiety   . Aortic stenosis   . Arthritis   . CAD (coronary artery disease)    psot bypass x2 to diagonal and obtuse marginal. SVG graft-01/08/2009  . Depression   . Dyspnea   . GERD (gastroesophageal reflux disease)   . History of cardiovascular stress test    Myoview 7/16:  Low risk, no ischemia or scar, EF 72%  . History of echocardiogram    Echo 7/16:  EF 55-60%, no RWMA, Gr 2 DD, AVR ok (mean 20 mmHg), PASP 40 mmHg  . History of GI bleed    With AVM  . Hypercholesterolemia   . Hyperlipidemia   . Hypertension   . Iron deficiency anemia   . Myocardial infarction ([place]) 2010  . Neuromuscular disorder (HCC)    numbness in hands  . Peripheral vascular disease ([place])    righ tleg  . Shoulder impingement syndrome    Right shoulder    Allergies Allergies  Allergen Reactions  . Codeine Other (See Comments)    Feeling intoxicated  . Lorazepam Other (See Comments)    Talk out of head, joggy    IV Location/Drains/Wounds Patient Lines/Drains/Airways Status   Active Line/Drains/Airways    Name:   Placement date:   Placement time:   Site:   Days:   Peripheral IV 08/07/18 Anterior;Left  Forearm   08/07/18    1331    Forearm   3   Peripheral IV 09-05-2018 Left Antecubital   09-05-2018    1236    Antecubital   less than 1          Labs/Imaging Results for orders placed or performed during the hospital encounter of 09/05/18 (from the past 48 hour(s))  Comprehensive metabolic panel     Status: Abnormal   Collection Time: September 05, 2018 12:37 PM  Result Value Ref Range   Sodium 136 135 - 145 mmol/L   Potassium 5.3 (H) 3.5 - 5.1 mmol/L   Chloride 97 (L) 98 - 111 mmol/L   CO2 27 22 - 32 mmol/L   Glucose, Bld 160 (H) 70 - 99 mg/dL   BUN 14 8 - 23 mg/dL   Creatinine, Ser 0.69 0.44 - 1.00 mg/dL   Calcium 8.8 (L) 8.9 - 10.3 mg/dL   Total Protein 7.3 6.5 - 8.1 g/dL   Albumin 3.5 3.5 - 5.0 g/dL   AST 45 (H) 15 - 41 U/L   ALT 20 0 - 44 U/L    Alkaline Phosphatase 116 38 - 126 U/L   Total Bilirubin 1.2 0.3 - 1.2 mg/dL   GFR calc non Af Amer >60 >60 mL/min   GFR calc Af Amer >60 >60 mL/min    Comment: (NOTE) The eGFR has been calculated using the CKD EPI equation. This calculation has not been validated in all clinical situations. eGFR's persistently <60 mL/min signify possible Chronic Kidney Disease.    Anion gap 12 5 - 15    Comment: Performed at [facility], [place] [address], [place], [place] [address]  CBC with Differential     Status: Abnormal   Collection Time: 05-Sep-2018 12:37 PM  Result Value Ref Range   WBC 20.5 (H) 4.0 - 10.5 K/uL   RBC 3.52 (L) 3.87 - 5.11 MIL/uL   Hemoglobin 10.4 (L) 12.0 - 15.0 g/dL   HCT 34.4 (L) 36.0 - 46.0 %   MCV 97.7 80.0 - 100.0 fL   MCH 29.5 26.0 - 34.0 pg   MCHC 30.2 30.0 - 36.0 g/dL   RDW 15.4 11.5 - 15.5 %   Platelets 64 (L) 150 - 400 K/uL    Comment: REPEATED TO VERIFY PLATELET COUNT CONFIRMED BY SMEAR SPECIMEN CHECKED FOR CLOTS Immature Platelet Fraction may be clinically indicated, consider ordering this additional test [id number]    nRBC 0.0 0.0 - 0.2 %   Neutrophils Relative % 89 %   Neutro Abs 18.1 (H) 1.7 - 7.7 K/uL   Lymphocytes Relative 0 %   Lymphs Abs 0.0 (L) 0.7 - 4.0 K/uL   Monocytes Relative 1 %   Monocytes Absolute 0.2 0.1 - 1.0 K/uL   Eosinophils Relative 0 %   Eosinophils Absolute 0.1 0.0 - 0.5 K/uL   Basophils Relative 0 %   Basophils Absolute 0.1 0.0 - 0.1 K/uL   Immature Granulocytes 10 %   Abs Immature Granulocytes 2.04 (H) 0.00 - 0.07 K/uL    Comment: Performed at [facility], [place] [address] [place], [place] [address]  Protime-INR     Status: Abnormal   Collection Time: September 05, 2018 12:37 PM  Result Value Ref Range   Prothrombin Time 17.5 (H) 11.4 - 15.2 seconds   INR 1.46     Comment: Performed at [facility], [place] [address], [place], [place] [address]  I-Stat CG4 Lactic Acid, ED  Status:  Abnormal   Collection Time: 2018/08/11 12:44 PM  Result Value Ref Range   Lactic Acid, Venous 4.04 (HH) 0.5 - 1.9 mmol/L   Comment NOTIFIED PHYSICIAN   Urinalysis, Routine w reflex microscopic     Status: Abnormal   Collection Time: 08-11-2018  2:21 PM  Result Value Ref Range   Color, Urine YELLOW YELLOW   APPearance HAZY (A) CLEAR   Specific Gravity, Urine 1.018 1.005 - 1.030   pH 6.0 5.0 - 8.0   Glucose, UA NEGATIVE NEGATIVE mg/dL   Hgb urine dipstick MODERATE (A) NEGATIVE   Bilirubin Urine NEGATIVE NEGATIVE   Ketones, ur NEGATIVE NEGATIVE mg/dL   Protein, ur >=300 (A) NEGATIVE mg/dL   Nitrite NEGATIVE NEGATIVE   Leukocytes, UA TRACE (A) NEGATIVE   RBC / HPF 6-10 0 - 5 RBC/hpf   WBC, UA 21-50 0 - 5 WBC/hpf   Bacteria, UA FEW (A) NONE SEEN   Squamous Epithelial / LPF 0-5 0 - 5    Comment: Performed at [facility], [place] [address], [place], [place] [address]  I-Stat CG4 Lactic Acid, ED     Status: Abnormal   Collection Time: 08-11-2018  4:02 PM  Result Value Ref Range   Lactic Acid, Venous 5.61 (HH) 0.5 - 1.9 mmol/L   Comment NOTIFIED PHYSICIAN    Dg Chest 2 View  Result Date: 08-11-2018 CLINICAL DATA:  Fever and altered mental status. EXAM: CHEST - 2 VIEW COMPARISON:  04/09/2017 FINDINGS: Previous median sternotomy and aortic valve replacement. Right arm PICC tip at the SVC RA junction. Small bilateral pleural effusions layering dependently. Hazy pulmonary opacity that could relate to fluid overload or hazy pneumonia. Mediastinal mass as noted previously. Chronic degenerative changes affect the spine. IMPRESSION: Small effusions. Hazy pulmonary opacity that could be fluid overload/edema versus is hazy pneumonia. No dense consolidation. Extensive mediastinal mass as shown previously. Electronically Signed   By: Nelson Chimes M.D.   On: Aug 11, 2018 13:05   Ct Head Wo Contrast  Result Date: 08/11/18 CLINICAL DATA:  Shaking and slurring of speech. Altered level of  consciousness. EXAM: CT HEAD WITHOUT CONTRAST TECHNIQUE: Contiguous axial images were obtained from the base of the skull through the vertex without intravenous contrast. COMPARISON:  MRI of the brain 08/01/2018 FINDINGS: Brain: Redemonstration of cerebellar and supratentorial intra-axial necrotic masses some which with peripheral calcifications. Within the cerebellum there are at least 2 calcified intra-axial masses estimated at 8 and 9 mm on this unenhanced study. These appear larger on contrast-enhanced MRI some which is likely due to differences in technique. The largest masses in the left inferior cerebellar hemisphere measuring approximately 2.3 cm on CT with adjacent vasogenic edema and stable positive slight mass effect on the fourth ventricle without hydrocephalus. Redemonstration of intra-axial masses in the right frontal white matter measuring to 1.6 cm, right thalamus measuring up to 1.1 cm, left inferior frontal lobe with calcification measuring 6 mm. Adjacent left temporal lobe lesions are not well visualized on this unenhanced study. No acute intracranial hemorrhage. No hydrocephalus or midline shift. No acute large vascular territory infarct. Vascular: No hyperdense vessel signs. Marked atherosclerosis of the carotid siphons bilaterally. Skull: No aggressive osseous lesions. Sinuses/Orbits: Intact orbits. Mucosal opacification of the included maxillary sinuses bilaterally with mild-to-moderate ethmoid sinus mucosal thickening. The sphenoid sinuses clear. The frontal sinus is not pneumatized. Other: No suspicious calvarial abnormality. IMPRESSION: 1. 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 Vasogenic edema adjacent to the left inferior cerebellar mass  causing slight positive mass effect on the left lateral aspect of fourth ventricle without causing hydrocephalus. The largest intra-axial mass is in the left inferior cerebellar hemisphere measuring 2.3 cm. 2.  Redemonstration of intra-axial masses in the right frontal white matter measuring up to 1.6 cm, right thalamus measuring 1.1 cm and left inferior frontal lobe with calcification measuring 6 mm. Additional lesions better visualized on prior MRI are not well visualized on this unenhanced CT. 3. No acute intracranial abnormality. Electronically Signed   By: Ashley Royalty M.D.   On: 2018/08/25 15:55   EKG Interpretation  Date/Time:  08/25/2018 13:42:47 EST Ventricular Rate:  108 PR Interval:    QRS Duration: 69 QT Interval:  312 QTC Calculation: 419 R Axis:   80 Text Interpretation:  Sinus tachycardia Atrial premature complex Low voltage, extremity and precordial leads no significant change since earlier in the day Confirmed by Sherwood Gambler [phone]) on 08-25-2018 2:20:25 PM   Pending Labs Unresulted Labs (From admission, onward)    Start     Ordered   2018/08/25 1254  Urine culture  ONCE - STAT,   STAT     08/25/2018 1255   08/25/2018 1226  Culture, blood (Routine x 2)  BLOOD CULTURE X 2,   STAT     August 25, 2018 1226   Signed and Held  Lactic acid, plasma  STAT Now then every 3 hours,   STAT     Signed and Held   Signed and Held  APTT  ONCE - STAT,   R     Signed and Held   Signed and Held  Comprehensive metabolic panel  Tomorrow morning,   R     Signed and Held          Vitals/Pain Today's Vitals   08/25/18 1701 08/25/18 1715 08/25/18 1730 08/25/18 1745  BP: (!) 80/43  114/82 120/70  Pulse:      Resp: 14 (!) 23 (!) 27 15  Temp:      TempSrc:      SpO2: 100% 97%  99%  Weight:      PainSc:        Isolation Precautions No active isolations  Medications Medications  sodium chloride 0.9 % bolus 1,000 mL (has no administration in time range)    And  sodium chloride 0.9 % bolus 1,000 mL (has no administration in time range)  albuterol (PROVENTIL) (2.5 MG/3ML) 0.083% nebulizer solution 2.5 mg (has no administration in time range)  ceFEPIme (MAXIPIME) 2 g in sodium  chloride 0.9 % 100 mL IVPB (0 g Intravenous Stopped August 25, 2018 1411)  sodium chloride 0.9 % bolus 1,000 mL (0 mLs Intravenous Stopped August 25, 2018 1524)    And  sodium chloride 0.9 % bolus 1,000 mL (1,000 mLs Intravenous New Bag/Given 08-25-18 1420)  acetaminophen (TYLENOL) tablet 650 mg (650 mg Oral Given 08-25-2018 1326)  vancomycin (VANCOCIN) 1,250 mg in sodium chloride 0.9 % 250 mL IVPB (0 mg Intravenous Stopped 25-Aug-2018 1604)  ipratropium-albuterol (DUONEB) 0.5-2.5 (3) MG/3ML nebulizer solution 3 mL (3 mLs Nebulization Given 2018-08-25 1334)  ipratropium-albuterol (DUONEB) 0.5-2.5 (3) MG/3ML nebulizer solution 3 mL (3 mLs Nebulization Given 2018-08-25 1420)    Mobility walks with assistance

## 2018-08-23 NOTE — ED Triage Notes (Addendum)
Pt from [facility] center to receive medication to "boost her white blood cells." Fever and AMS noted at [facility]. Pt has unclamped PICC to right arm; redness noted around PICC site.

## 2018-08-23 NOTE — ED Notes (Signed)
Pt O2 saturation 86-88 % on 4 lpm, increased O2 to 6 lpm via [place] for O2 @ 95%.

## 2018-08-23 NOTE — ED Notes (Signed)
Ed provider Regenia Skeeter notified patient has a critical lactic acid value of 5.61

## 2018-08-23 NOTE — H&P (Signed)
History and Physical  [name] DZH:[id number] DOB: 09/24/1940 DOA: 08-28-18  Referring physician: Crist Infante, ER physician PCP: Donald Prose, MD  Outpatient Specialists: Curt Bears, oncology Patient coming from: Home & is able to ambulate with use of a walker  Chief Complaint: Confusion  HPI: [name] is a 77 y.o. 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  Patient did okay over the weekend, but then this morning, patient's daughter found her to be very confused, moaning as if in pain and patient was brought into the emergency room.  ED Course: In the emergency room, patient was found to low-grade temperatures as high as 100.4, tachycardic and hypotensive.  Lab work noteworthy for white count of 20.5 and a lactic acid level of 4.04.  Patient felt to be in septic shock.  Started on IV fluids and blood culture sent.  Urinalysis and chest x-ray unimpressive.  She also was noted to be hypoxic requiring 2 L nasal cannula.  Emergency room evaluated her PICC line and found redness as well as drainage at the insertion site.  Peripheral IV was placed and PICC line was removed with culture tip sent.  Hospitalist were called for further evaluation.  4 hours after initial lactic acid level, patient still hypotensive with a systolic in the 41D and lactic acid level now at 5.4.  Blood pressure has since stabilized to a systolic of 622.  Review of Systems: Patient seen in the emergency room.  She is acutely delirious and difficult to get any kind of review of systems.  When asked if she hurts, she says yes but then also no just kind of moans and has a hard time  focusing on any questions.   Past Medical History:  Diagnosis Date  . Abdominal pain, unspecified site   . Anemia    Iron deficiency anemia  . Anxiety   . Aortic stenosis   . Arthritis   . CAD (coronary artery disease)    psot bypass x2 to diagonal and obtuse marginal. SVG graft-01/08/2009  . Depression   . Dyspnea   . GERD (gastroesophageal reflux disease)   . History of cardiovascular stress test    Myoview 7/16:  Low risk, no ischemia or scar, EF 72%  . History of echocardiogram    Echo 7/16:  EF 55-60%, no RWMA, Gr 2 DD, AVR ok (mean 20 mmHg), PASP 40 mmHg  . History of GI bleed    With AVM  . Hypercholesterolemia   . Hyperlipidemia   . Hypertension   . Iron deficiency anemia   . Myocardial infarction ([place]) 2010  . Neuromuscular disorder (HCC)    numbness in hands  . Peripheral vascular disease ([place])    righ tleg  . Shoulder impingement syndrome    Right shoulder   Past Surgical History:  Procedure Laterality Date  . ABDOMINAL HYSTERECTOMY    . AORTIC VALVE REPLACEMENT     [facility] #[id number]  . CARDIAC CATHETERIZATION N/A 08/11/2015   Procedure: Right Heart Cath;  Surgeon: Jolaine Artist, MD;  Location: [facility] [facility];  Service: Cardiovascular;  Laterality: N/A;  . CARDIAC SURGERY  2010   , 2  blockages  . CHOLECYSTECTOMY    . COLONOSCOPY N/A 04/12/2017   Procedure: COLONOSCOPY;  Surgeon: Manus Gunning, MD;  Location: [facility] ENDOSCOPY;  Service: Gastroenterology;  Laterality: N/A;  . CORONARY ARTERY BYPASS GRAFT    . ENTEROSCOPY N/A 10/01/2017   Procedure: ENTEROSCOPY;  Surgeon: Irene Shipper, MD;  [facility] END[facility];  Service: Endoscopy;  Laterality: N/A;  . ESOPHAGOGASTRODUODENOSCOPY N/A 04/12/2017   Procedure: ESOPHAGOGASTRODUODENOSCOPY (EGD);  Surgeon: Manus Gunning, MD;  Location: [place];  Service: Gastroenterology;  Laterality: N/A;  . GIVENS CAPSULE STUDY N/A 02/23/2018   Procedure: GIVENS CAPSULE STUDY;  Surgeon: Milus Banister,  MD;  Location: [facility] ENDOSCOPY;  Service: Endoscopy;  Laterality: N/A;  . HEEL SPUR EXCISION  2000   left heel  . SPINE SURGERY  2003   Herniated diskectomy  . TOTAL HIP ARTHROPLASTY Right 08/22/2017   Procedure: RIGHT TOTAL HIP ARTHROPLASTY ANTERIOR APPROACH;  Surgeon: Mcarthur Rossetti, MD;  Location: [facility] ORS;  Service: Orthopedics;  Laterality: Right;  . WRIST SURGERY     right wrist    Social History:  reports that she has been smoking cigarettes. She has a 52.00 pack-year smoking history. She has never used smokeless tobacco. She reports that she does not drink alcohol or use drugs.  Lives at home with daughter.  Ambulates with a walker.   Allergies  Allergen Reactions  . Codeine Other (See Comments)    Feeling intoxicated  . Lorazepam Other (See Comments)    Talk out of head, joggy    Family History  Problem Relation Age of Onset  . Hypertension Mother   . Kidney disease Mother   . Hypertension Father   . Hypertension Sister   . Hypertension Brother   . Hypertension Daughter   . Diabetes Brother   . Heart attack Neg Hx   . Stroke Neg Hx       Prior to Admission medications   Medication Sig Start Date End Date Taking? Authorizing Provider  acetaminophen (TYLENOL) 325 MG tablet Take 2 tablets (650 mg total) by mouth every 6 (six) hours as needed for mild pain (or Fever >/= 101). 04/12/17  Yes Rosita Fire, MD  albuterol (PROVENTIL) (2.5 MG/3ML) 0.083% nebulizer solution Take 3 mLs (2.5 mg total) by nebulization every 6 (six) hours as needed for wheezing or shortness of breath. 07/27/18  Yes Juanito Doom, MD  amLODipine (NORVASC) 10 MG tablet Take 10 mg by mouth daily.   Yes [provider]  atorvastatin (LIPITOR) 80 MG tablet TAKE 1 TABLET BY MOUTH EVERY DAY Patient taking differently: Take 80 mg by mouth daily.  08/06/17  Yes Skains, Thana Farr, MD  BYSTOLIC 5 MG tablet TAKE 1 TABLET BY MOUTH EVERY DAY Patient taking differently: Take 5 mg by mouth  daily.  03/31/18  Yes Jerline Pain, MD  Calcium Carbonate-Vitamin D (CALCIUM 600+D) 600-400 MG-UNIT per tablet Take 1 tablet by mouth daily.   Yes [provider]  chlorpheniramine-HYDROcodone (TUSSIONEX PENNKINETIC ER) 10-8 MG/5ML SUER Take 5 mLs by mouth every 12 (twelve) hours as needed for cough. 07/27/18  Yes Juanito Doom, MD  dexamethasone (DECADRON) 4 MG tablet Take 1 tablet (4 mg total) by mouth 2 (two) times daily. Patient taking differently: Take 4 mg by mouth 3 (three) times daily.  07/31/18  Yes Curt Bears, MD  ferrous sulfate 325 (65 FE) MG tablet Take 1 tablet (325 mg total) by mouth 2 (two) times daily with a meal. 10/03/17  Yes Rosita Fire, MD  omeprazole (PRILOSEC) 40 MG capsule Take 1 capsule (40 mg total) by mouth 2 (two) times daily. Patient taking differently: Take 40 mg by mouth daily.  04/12/17  Yes Rosita Fire, MD  potassium chloride (MICRO-K) 10 MEQ CR capsule TAKE 2 CAPSULES BY MOUTH TWICE A DAY Patient taking differently: Take 20 mEq by mouth 2 (two) times daily.  09/15/17  Yes Jerline Pain, MD  prochlorperazine (COMPAZINE) 10 MG tablet Take 1 tablet (10 mg total) by mouth every 6 (six) hours as needed for nausea or vomiting. 07/31/18  Yes Curt Bears, MD  tiotropium (SPIRIVA HANDIHALER) 18 MCG inhalation capsule Place 1 capsule (18 mcg total) into inhaler and inhale every morning. 07/27/18  Yes Juanito Doom, MD    Physical Exam: BP (!) 80/43   Pulse (!) 102   Temp 98.7 F (37.1 C) (Oral)   Resp (!) 23   Wt 61.2 kg Comment: recorded 3 days ago  LMP  (LMP Unknown)   SpO2 97%   BMI 41.20 kg/m   General: Delirious, moderate distress-?  Pain Eyes: Sclera nonicteric ENT: Normocephalic, atraumatic, mucous memories are dry.  Upper partial dentures Neck: Supple, no JVD Cardiovascular: Regular rhythm, borderline tachycardia Respiratory: Few Rales Abdomen: Soft, mild nonspecific tenderness, hypoactive bowel sounds,  nondistended Skin: Skin is dry, no skin breaks, tears or lesions Musculoskeletal: No clubbing or cyanosis, trace pitting edema Psychiatric: Acutely confused Neurologic: No focal deficits          Labs on Admission:  Basic Metabolic Panel: Recent Labs  Lab 08/04/18 1016 08/07/18 1317 09-09-18 1237  NA 143 139 136  K 4.0 4.6 5.3*  CL 107 103 97*  CO2 26 25 27   GLUCOSE 142* 116* 160*  BUN 8 11 14   CREATININE 0.66 0.50 0.69  CALCIUM 9.6 9.9 8.8*   Liver Function Tests: Recent Labs  Lab 08/04/18 1016 09-09-18 1237  AST 26 45*  ALT 15 20  ALKPHOS 76 116  BILITOT 0.5 1.2  PROT 7.7 7.3  ALBUMIN 3.7 3.5   No results for input(s): LIPASE, AMYLASE in the last 168 hours. No results for input(s): AMMONIA in the last 168 hours. CBC: Recent Labs  Lab 08/04/18 1016 08/07/18 1317 09-09-2018 1237  WBC 9.1 10.6* 20.5*  NEUTROABS 7.9*  --  18.1*  HGB 10.7* 10.9* 10.4*  HCT 36.2 38.5 34.4*  MCV 98.4 98.2 97.7  PLT 210 182 64*   Cardiac Enzymes: No results for input(s): CKTOTAL, CKMB, CKMBINDEX, TROPONINI in the last 168 hours.  BNP (last 3 results) No results for input(s): BNP in the last 8760 hours.  ProBNP (last 3 results) No results for input(s): PROBNP in the last 8760 hours.  CBG: No results for input(s): GLUCAP in the last 168 hours.  Radiological Exams on Admission: Dg Chest 2 View  Result Date: 09/09/18 CLINICAL DATA:  Fever and altered mental status. EXAM: CHEST - 2 VIEW COMPARISON:  04/09/2017 FINDINGS: Previous median sternotomy and aortic valve replacement. Right arm PICC tip at the SVC RA junction. Small bilateral pleural effusions layering dependently. Hazy pulmonary opacity that could relate to fluid overload or hazy pneumonia. Mediastinal mass as noted previously. Chronic degenerative changes affect the spine. IMPRESSION: Small effusions. Hazy pulmonary opacity that could be fluid overload/edema versus is hazy pneumonia. No dense consolidation. Extensive  mediastinal mass as shown previously. Electronically Signed   By: Nelson Chimes M.D.   On: 09-09-18 13:05   Ct Head Wo Contrast  Result Date: 2018/08/16 CLINICAL DATA:  Shaking and slurring of speech. Altered level of consciousness. EXAM: CT HEAD WITHOUT CONTRAST TECHNIQUE: Contiguous axial images were obtained from the base of the skull through the vertex without intravenous contrast. COMPARISON:  MRI of the brain 08/01/2018 FINDINGS: Brain: Redemonstration of cerebellar and supratentorial intra-axial necrotic masses some which with peripheral calcifications. Within the cerebellum there are at least 2 calcified intra-axial masses estimated at 8 and 9 mm on this unenhanced study. These appear larger on contrast-enhanced MRI some which is likely due to differences in technique. The largest masses in the left inferior cerebellar hemisphere measuring approximately 2.3 cm on CT with adjacent vasogenic edema and stable positive slight mass effect on the fourth ventricle without hydrocephalus. Redemonstration of intra-axial masses in the right frontal white matter measuring to 1.6 cm, right thalamus measuring up to 1.1 cm, left inferior frontal lobe with calcification measuring 6 mm. Adjacent left temporal lobe lesions are not well visualized on this unenhanced study. No acute intracranial hemorrhage. No hydrocephalus or midline shift. No acute large vascular territory infarct. Vascular: No hyperdense vessel signs. Marked atherosclerosis of the carotid siphons bilaterally. Skull: No aggressive osseous lesions. Sinuses/Orbits: Intact orbits. Mucosal opacification of the included maxillary sinuses bilaterally with mild-to-moderate ethmoid sinus mucosal thickening. The sphenoid sinuses clear. The frontal sinus is not pneumatized. Other: No suspicious calvarial abnormality. IMPRESSION: 1. 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 Vasogenic edema adjacent to the left inferior  cerebellar mass causing slight positive mass effect on the left lateral aspect of fourth ventricle without causing hydrocephalus. The largest intra-axial mass is in the left inferior cerebellar hemisphere measuring 2.3 cm. 2. Redemonstration of intra-axial masses in the right frontal white matter measuring up to 1.6 cm, right thalamus measuring 1.1 cm and left inferior frontal lobe with calcification measuring 6 mm. Additional lesions better visualized on prior MRI are not well visualized on this unenhanced CT. 3. No acute intracranial abnormality. Electronically Signed   By: Ashley Royalty M.D.   On: 16-Aug-2018 15:55    EKG: Independently reviewed.  Normal sinus rhythm with PACs  Assessment/Plan Present on Admission: . Septic shock [facility]): Patient meets criteria for sepsis on admission given leukocytosis, lactic acidosis, tachycardia, hypotension with little improvement with initial fluid bolus.  Patient's blood pressure from started to stabilize.  Lactic acid level initially at 4, worsened to 5.6.  More fluid boluses, blood cultures pending.  Has already received initial course of antibiotics.  Unclear etiology, source may be from PICC line, but PICC line was just placed 3 days prior.  Urine weakly positive.  Monitor closely.  Have spoken with critical care  . PVD (peripheral vascular disease) ([place])  . Major depressive disorder, single episode, unspecified  . Essential hypertension: Holding antihypertensives secondary to hypotension  . Small cell carcinoma of lung ([place]) with necrotic brain metastases: Oncology notified of admission.  Limited prognosis as is.  I confirmed her DNR.  For now, family would like her to be treated otherwise.  Already on steroids, have increased dose for stress dose steroids.  . Acute metabolic encephalopathy: Secondary to sepsis  Principal Problem:   Septic shock ([place]) Active Problems:   PVD (peripheral vascular disease) (HCC)   Major depressive disorder, single  episode, unspecified   Essential hypertension   Small cell carcinoma of lung (HCC)   Lung cancer metastatic to brain ([place])   Acute metabolic encephalopathy   DVT prophylaxis: SCD's   Code Status: DNR as confirmed by family  Family Communication: Sister and daughter at the bed  Disposition Plan: Hopefully sepsis will stabilize, will be here until that happens.  Consults called: Critical care  Admission status: Patient is critically ill and will need acute hospital services well past 2 midnights, therefore admit as inpatient  I have spent 45 minutes in the care of this critically ill patient with bedside face-to-face examination, review of old medical records, review of labs and films and medical decision making    Annita Brod MD [facility] Pager [phone]  If 7PM-7AM, please contact night-coverage [email or web address] Password [email or web address]  2018/09/08, 6:10 PM

## 2018-08-23 DEATH — deceased

## 2018-08-24 ENCOUNTER — Ambulatory Visit: Payer: Medicare Other

## 2018-08-25 ENCOUNTER — Ambulatory Visit: Payer: Medicare Other

## 2018-08-26 ENCOUNTER — Ambulatory Visit: Payer: Medicare Other

## 2018-08-26 ENCOUNTER — Ambulatory Visit: Payer: Medicare Other | Admitting: Internal Medicine

## 2018-08-26 ENCOUNTER — Ambulatory Visit: Payer: Medicare Other | Admitting: Nurse Practitioner

## 2018-08-26 ENCOUNTER — Other Ambulatory Visit: Payer: Medicare Other

## 2018-08-27 ENCOUNTER — Ambulatory Visit: Payer: Medicare Other

## 2018-08-28 ENCOUNTER — Ambulatory Visit: Payer: Medicare Other

## 2018-08-28 ENCOUNTER — Encounter: Payer: Self-pay | Admitting: Radiation Oncology

## 2018-08-28 NOTE — Progress Notes (Signed)
  Radiation Oncology         [phone] ________________________________  Name: [name] MRN: [id number]  Date: 08/28/2018  DOB: 1940-10-15  End of Treatment Note  Diagnosis:   extensive stage small cell lung cancer     Indication for treatment::  palliative       Radiation treatment dates:   08/03/18 - September 09, 2018  Site/planned dose:    1. Mediastinum / 37.5 Gy in 15 fractions 2. Brain / 30 Gy in 10 fractions  Narrative: The patient's status declined during her course of radiation, and her radiotherapy was discontinued after 6 treatments. She passed away at the hospital on 2018-09-09.  Plan: The patient died after discontinuing radiation. ________________________________  Jodelle Gross, M.D., Ph.D.  This document serves as a record of services personally performed by Kyung Rudd, MD. It was created on his behalf by Wilburn Mylar, a trained medical scribe. The creation of this record is based on the scribe's personal observations and the provider's statements to them. This document has been checked and approved by the attending provider.

## 2018-09-02 ENCOUNTER — Other Ambulatory Visit: Payer: Medicare Other

## 2018-09-03 ENCOUNTER — Encounter (HOSPITAL_COMMUNITY): Payer: Medicare Other

## 2018-09-09 ENCOUNTER — Other Ambulatory Visit: Payer: Medicare Other

## 2018-09-14 DIAGNOSIS — C7931 Secondary malignant neoplasm of brain: Secondary | ICD-10-CM | POA: Insufficient documentation

## 2018-09-14 NOTE — Progress Notes (Signed)
  Radiation Oncology         [phone] ________________________________  Name: [name] MRN: [id number]  Date: 07/31/2018  DOB: 03/06/1941  SIMULATION AND TREATMENT PLANNING NOTE  DIAGNOSIS:     ICD-10-CM   1. 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  Identity was confirmed.  All relevant records and images related to the planned course of therapy were reviewed.   Written consent to proceed with treatment was confirmed which was freely given after reviewing the details related to the planned course of therapy had been reviewed with the patient.  Then, the patient was set-up in a stable reproducible  supine position for radiation therapy.  CT images were obtained.  Surface markings were placed.    Medically necessary complex treatment device(s) for immobilization:  Customized vac-lock bag.   The CT images were loaded into the planning software.  Then the target and avoidance structures were contoured.  Treatment planning then occurred.  The radiation prescription was entered and confirmed.  A total of 3 complex treatment devices were fabricated which relate to the designed radiation treatment fields. Additional reduced fields will be used as necessary to improve the dose homogeneity of the plan. Each of these customized fields/ complex treatment devices will be used on a daily basis during the radiation course. I have requested : 3D Simulation  I have requested a DVH of the following structures: target volume, spinal cord, lungs, heart.   The patient will undergo daily image guidance to ensure accurate localization of the target, and adequate minimize dose to the normal surrounding structures in close proximity to the target.   PLAN:  The patient will receive 37.5 Gy in 15 fractions.   Special treatment procedure The patient will also receive concurrent chemotherapy during the  treatment. The patient may therefore experience increased toxicity or side effects and the patient will be monitored for such problems. This may require extra lab work as necessary. This therefore constitutes a special treatment procedure.   ________________________________   Jodelle Gross, MD, PhD

## 2018-09-14 NOTE — Progress Notes (Signed)
  Radiation Oncology         [phone] ________________________________  Name: LILLIONNA NABI MRN: [id number]  Date: 08/04/2018  DOB: [birth year]    Simulation and treatment planning note  DIAGNOSIS:     ICD-10-CM   1. Brain metastasis ([place]) C79.31      The patient presented for simulation for the patient's upcoming course of whole brain radiation treatment. The patient was placed in a supine position and a customized thermoplastic head cast was constructed to aid in patient immobilization during the treatment. This complex treatment device will be used on a daily basis. In this fashion a CT scan was obtained through the head and neck region and isocenter was placed near midline within the brain.  The patient will be planned to receive a course of whole brain radiation treatment to a dose of 30 gray in 10 fractions at 3 gray per fraction. To accomplish this, 2 customized blocks have been designed which corresponds to left and right whole brain radiation fields. These 2 complex treatment devices will be used on a daily basis during the course of radiation. A complex isodose plan is requested to insure that the target area is adequately covered in to facilitate optimization of the treatment plan. A forward planning technique will also be evaluated to determine if this approach significantly improves the plan.   ________________________________   Jodelle Gross, MD, PhD

## 2018-09-15 ENCOUNTER — Ambulatory Visit: Payer: Medicare Other

## 2018-09-15 ENCOUNTER — Other Ambulatory Visit: Payer: Medicare Other

## 2018-09-15 ENCOUNTER — Ambulatory Visit: Payer: Medicare Other | Admitting: Internal Medicine

## 2018-09-17 ENCOUNTER — Ambulatory Visit: Payer: Medicare Other

## 2018-09-18 ENCOUNTER — Ambulatory Visit: Payer: Medicare Other

## 2018-09-22 ENCOUNTER — Encounter (HOSPITAL_COMMUNITY): Payer: Medicare Other

## 2018-10-01 ENCOUNTER — Encounter (HOSPITAL_COMMUNITY): Payer: Medicare Other

## 2018-10-28 ENCOUNTER — Ambulatory Visit: Payer: Medicare Other | Admitting: Pulmonary Disease

## 2018-12-24 IMAGING — RF DG HIP (WITH PELVIS) OPERATIVE*R*
1 series · 2 of 2 positions shown · non-contrast
Comparison: None.

CLINICAL DATA: Right total hip replacement

EXAM:
DG C-ARM [id number] MIN-NO REPORT; OPERATIVE RIGHT HIP WITH PELVIS

[Series 1: run · 2 of 2 slices shown]
[im 1/2]
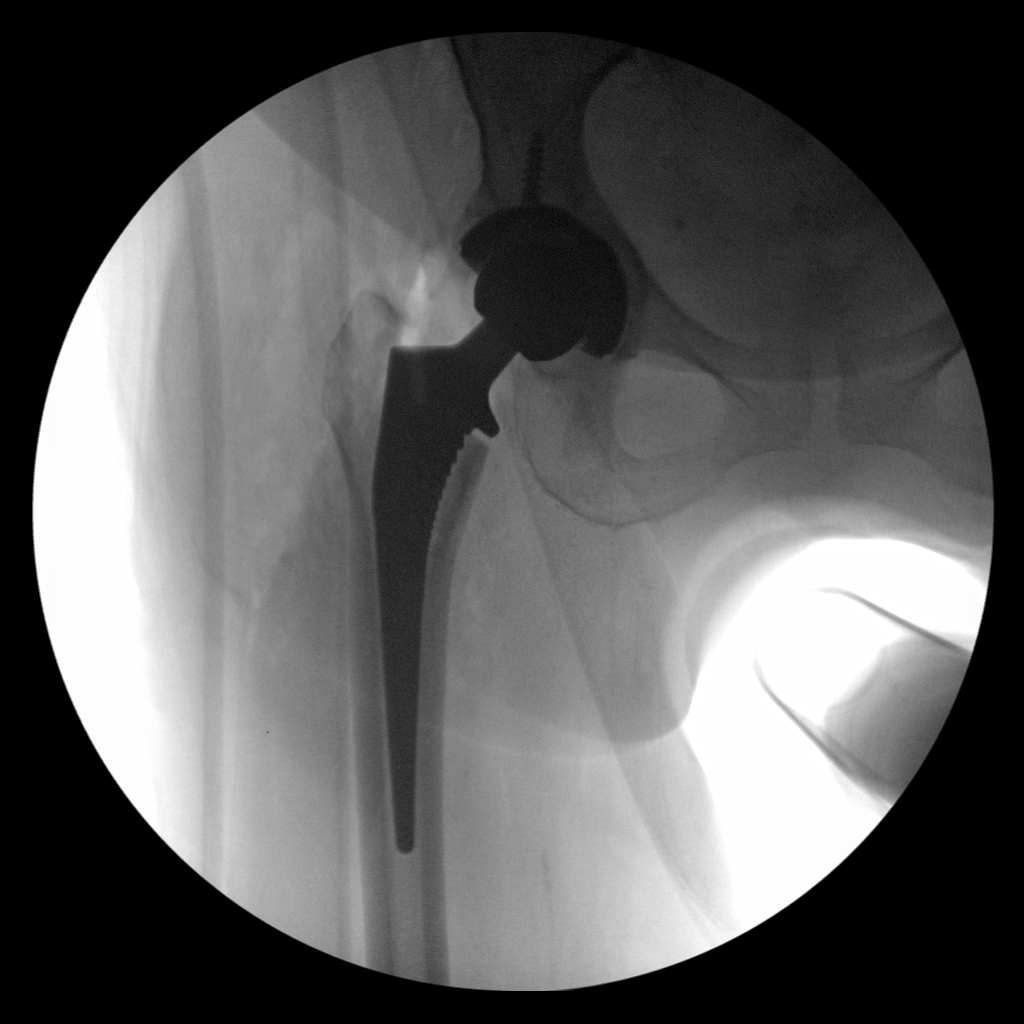
[im 2/2]
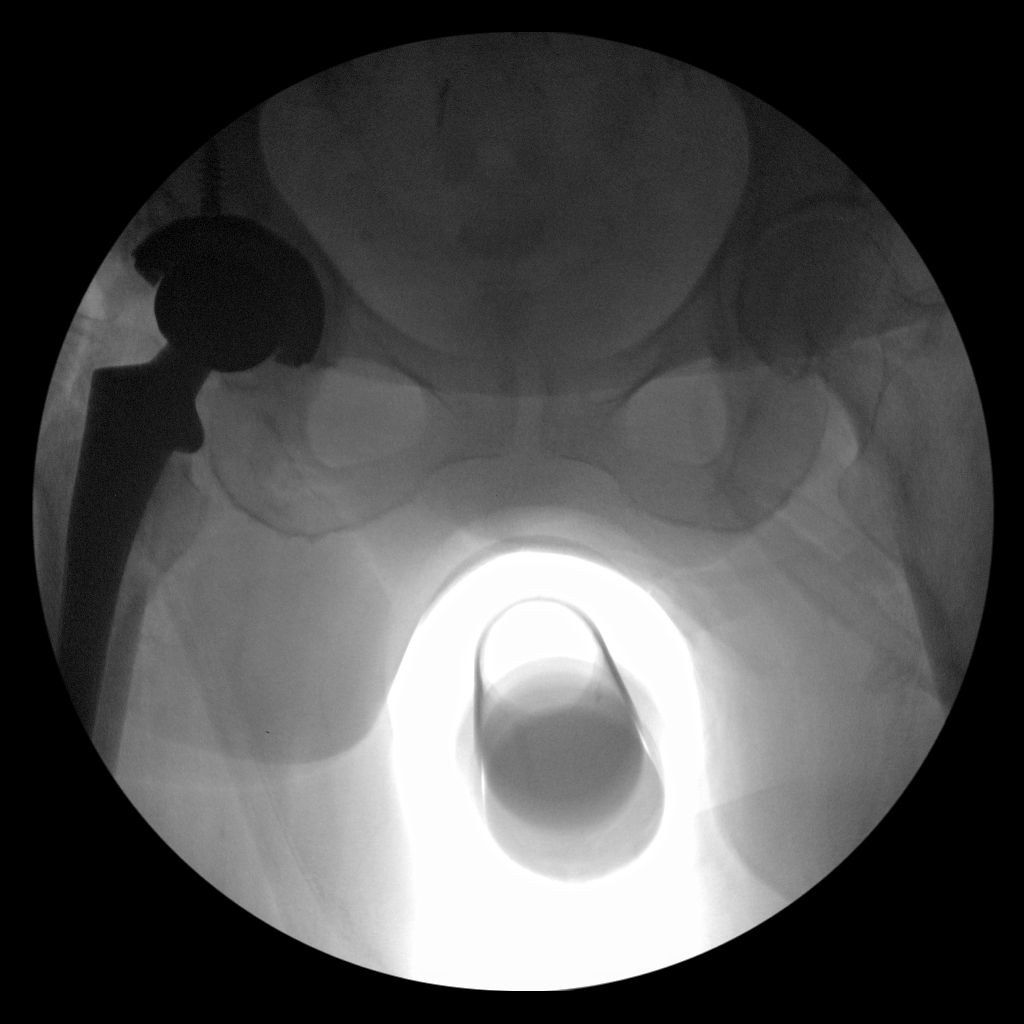

[2 of 2 positions shown; findings below may reference images not displayed]

FINDINGS: Changes of right hip replacement. Normal AP alignment. No hardware
bony complicating feature.
IMPRESSION: Changes of right hip replacement.  No complicating feature.

## 2020-02-14 IMAGING — CT CT BIOPSY
1 of 2 series · 15 of 29 positions shown, 19 images · non-contrast
Comparison: none

INDICATION: 77-year-old female with an infiltrative mediastinal mass encasing
the sternum and extending into the superficial soft tissues of the
anterior chest wall. She presents for CT-guided biopsy of the same
to establish tissue diagnosis.

[Series 2: i-spiral 5.0 b40f · axial · 0.73mm/px · z∈[-165,+20]mm · 15 of 59 slices shown, 19 images]
[im 3/59  mediastinal]
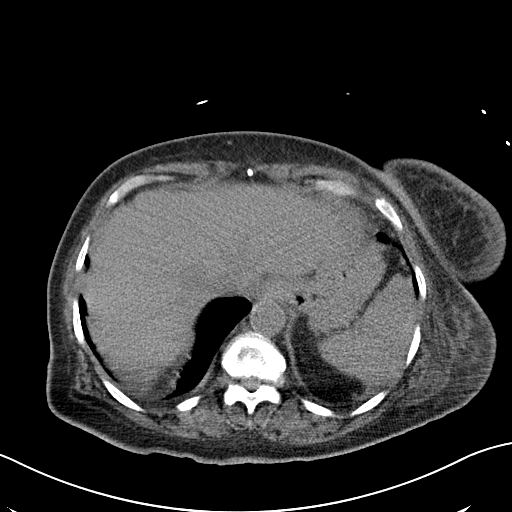
[im 3/59  lung]
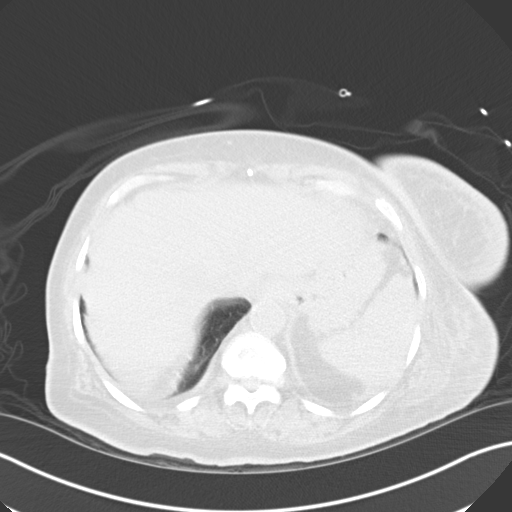
[im 8/59  lung]
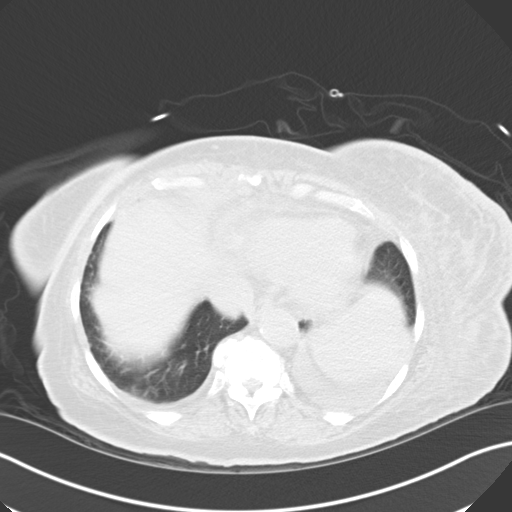
[im 13/59  lung]
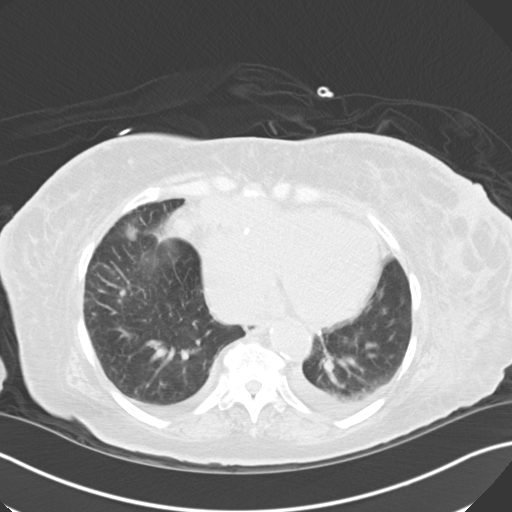
[im 15/59  lung]
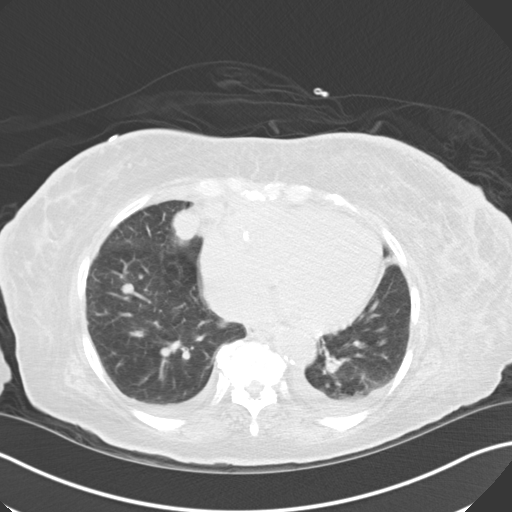
[im 20/59  mediastinal]
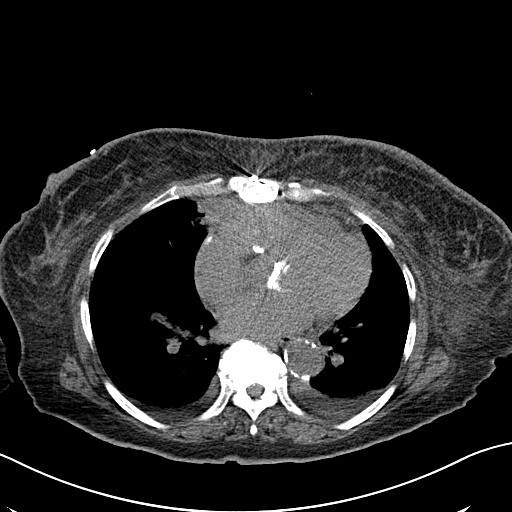
[im 20/59  lung]
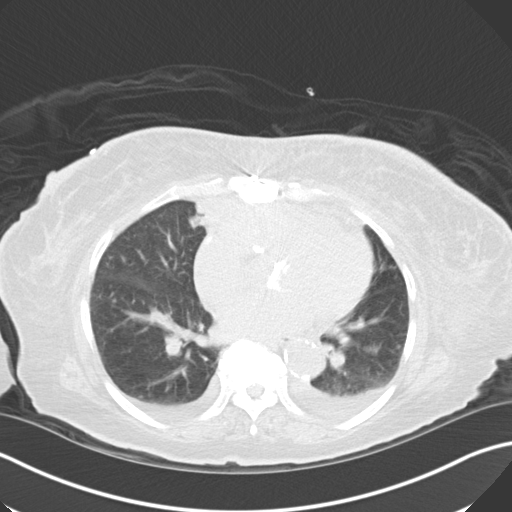
[im 22/59  lung]
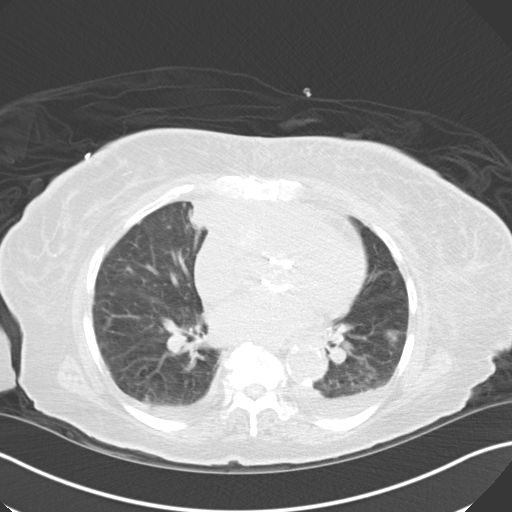
[im 27/59  lung]
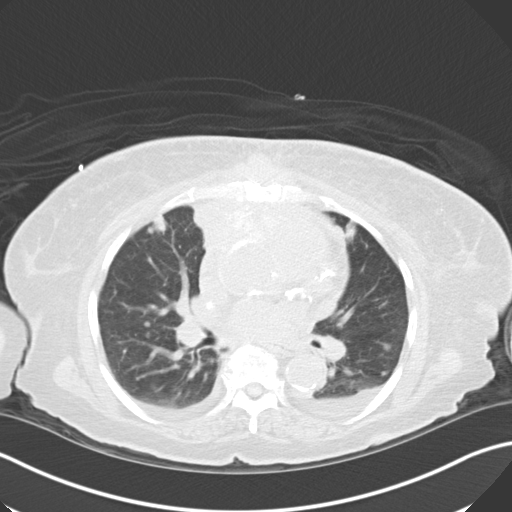
[im 30/59  lung]
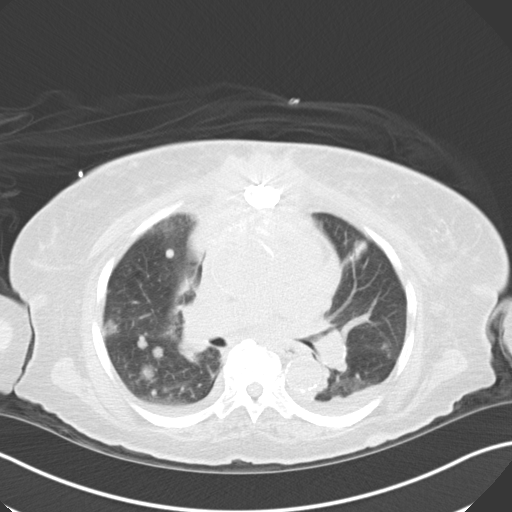
[im 32/59  mediastinal]
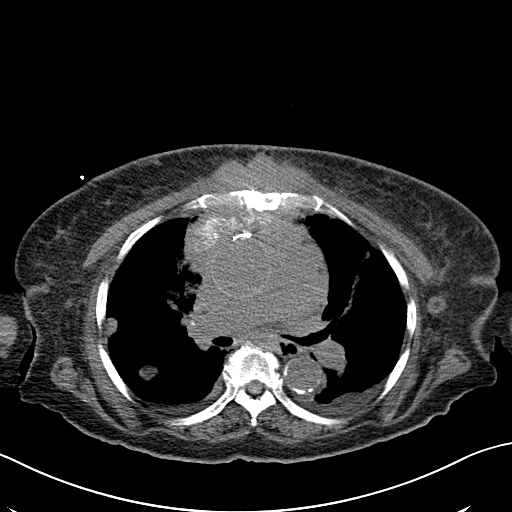
[im 32/59  lung]
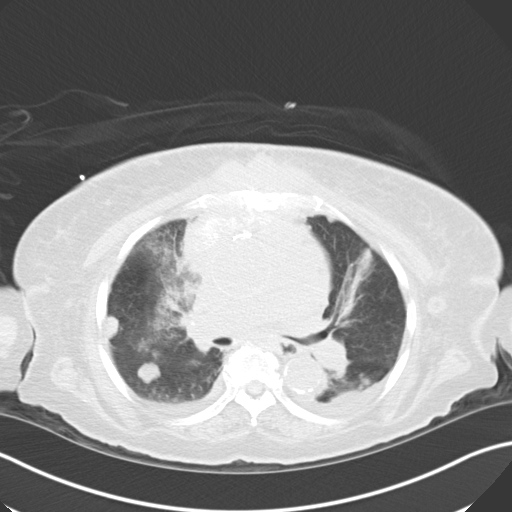
[im 37/59  lung]
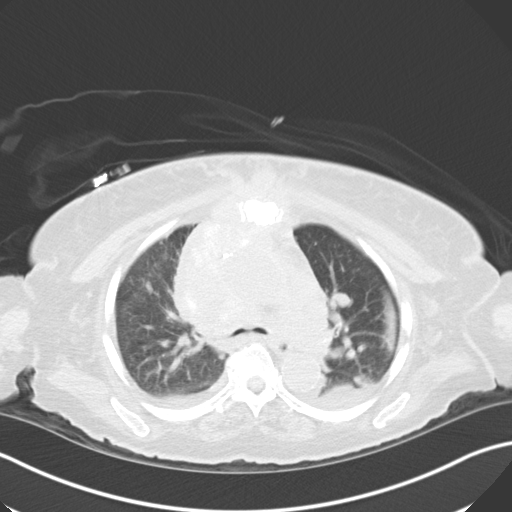
[im 39/59  lung]
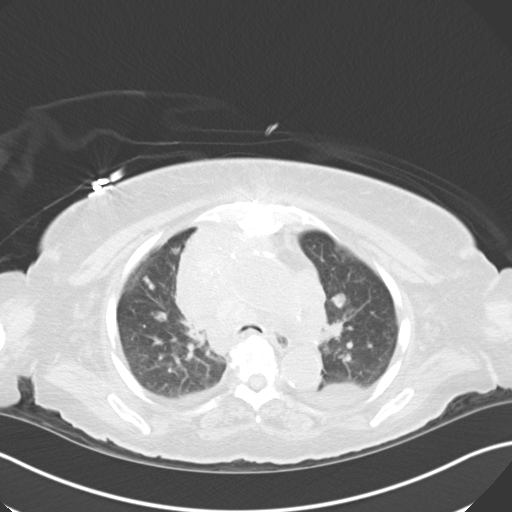
[im 44/59  lung]
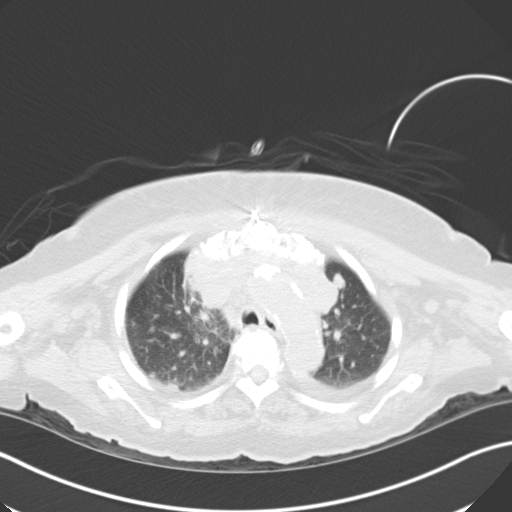
[im 49/59  mediastinal]
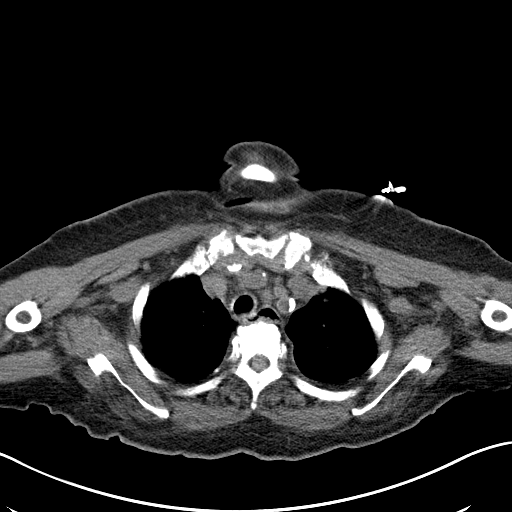
[im 49/59  lung]
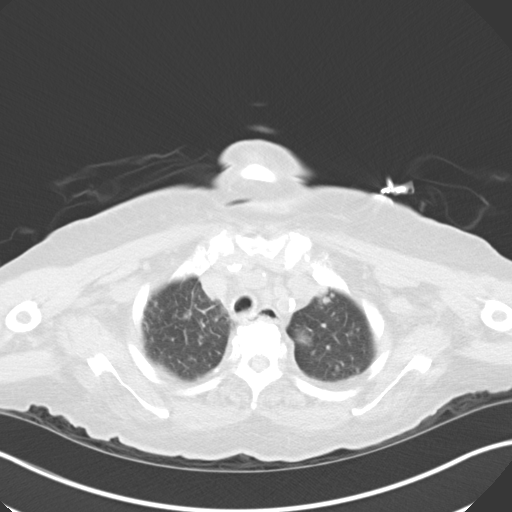
[im 51/59  lung]
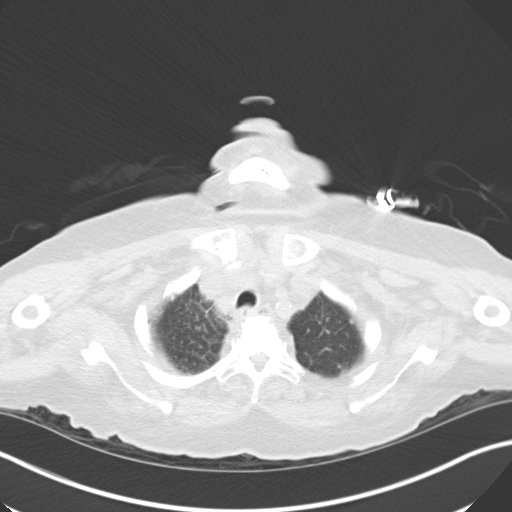
[im 56/59  lung]
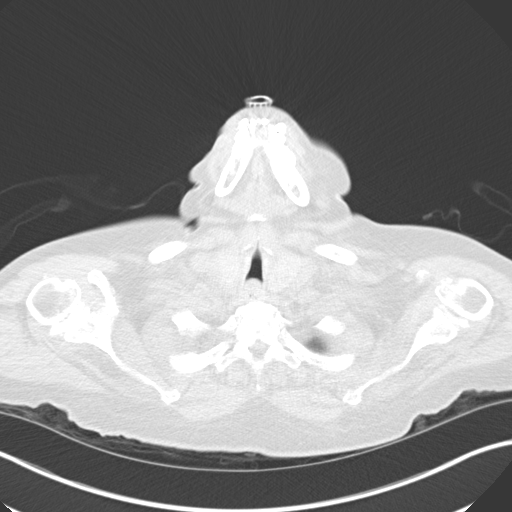

[15 of 29 positions shown; findings below may reference images not displayed]

EXAM:
CT-guided biopsy, chest wall mass

MEDICATIONS:
None.

ANESTHESIA/SEDATION:
Moderate (conscious) sedation was employed during this procedure. A
total of Versed 1.5 mg and Fentanyl 50 mcg was administered
intravenously.

Moderate Sedation Time: 10 minutes. The patient's level of
consciousness and vital signs were monitored continuously by
radiology nursing throughout the procedure under my direct
supervision.

FLUOROSCOPY TIME:  Fluoroscopy Time: 0 minutes 0 seconds (0 mGy).

COMPLICATIONS:
None immediate.

PROCEDURE:
Informed written consent was obtained from the patient after a
thorough discussion of the procedural risks, benefits and
alternatives. All questions were addressed. A timeout was performed
prior to the initiation of the procedure.

A planning axial CT scan was performed. The infiltrative soft tissue
mass surrounding the sternum was successfully identified. A suitable
skin entry site was selected and marked. The overlying skin was
prepped and draped in the standard sterile fashion using
chlorhexidine skin prep. Local anesthesia was attained by
infiltration with 1% lidocaine. A small dermatotomy was made. Using
intermittent CT guidance, a 17 gauge introducer needle was carefully
advanced into the margin of the superficial component of the mass.
Multiple 18 gauge core biopsies were then coaxially obtained using
the [employer] Doherty automated biopsy device. Biopsy specimens were placed
in both formalin and saline and delivered to pathology for further
analysis. The saline is to insure that flow cytometry can be
performed if necessary.

Following biopsy, the introducer needle was removed. Follow-up CT
imaging demonstrates no evidence of immediate complication.
IMPRESSION: Successful CT-guided biopsy of anterior chest wall mass.
